# Patient Record
Sex: Female | Born: 1955
Health system: Southern US, Community
[De-identification: ages and names within clinical notes are randomized; demographics above are authoritative.]

## PROBLEM LIST (undated history)

## (undated) DIAGNOSIS — E119 Type 2 diabetes mellitus without complications: Secondary | ICD-10-CM

## (undated) DIAGNOSIS — R0789 Other chest pain: Secondary | ICD-10-CM

## (undated) DIAGNOSIS — I5032 Chronic diastolic (congestive) heart failure: Secondary | ICD-10-CM

## (undated) DIAGNOSIS — K219 Gastro-esophageal reflux disease without esophagitis: Secondary | ICD-10-CM

## (undated) DIAGNOSIS — I10 Essential (primary) hypertension: Secondary | ICD-10-CM

## (undated) DIAGNOSIS — M1990 Unspecified osteoarthritis, unspecified site: Secondary | ICD-10-CM

## (undated) DIAGNOSIS — E669 Obesity, unspecified: Secondary | ICD-10-CM

## (undated) DIAGNOSIS — L0390 Cellulitis, unspecified: Secondary | ICD-10-CM

## (undated) DIAGNOSIS — I4510 Unspecified right bundle-branch block: Secondary | ICD-10-CM

## (undated) DIAGNOSIS — J189 Pneumonia, unspecified organism: Secondary | ICD-10-CM

## (undated) DIAGNOSIS — E7800 Pure hypercholesterolemia, unspecified: Secondary | ICD-10-CM

## (undated) DIAGNOSIS — J45909 Unspecified asthma, uncomplicated: Secondary | ICD-10-CM

## (undated) HISTORY — PX: TENDON RELEASE: SHX230

## (undated) HISTORY — PX: TUBAL LIGATION: SHX77

## (undated) HISTORY — PX: BUNIONECTOMY: SHX129

## (undated) HISTORY — DX: Other chest pain: R07.89

## (undated) HISTORY — DX: Obesity, unspecified: E66.9

## (undated) HISTORY — DX: Unspecified asthma, uncomplicated: J45.909

## (undated) HISTORY — DX: Morbid (severe) obesity due to excess calories: E66.01

## (undated) HISTORY — DX: Unspecified osteoarthritis, unspecified site: M19.90

## (undated) HISTORY — PX: MULTIPLE TOOTH EXTRACTIONS: SHX2053

## (undated) HISTORY — DX: Chronic diastolic (congestive) heart failure: I50.32

## (undated) HISTORY — DX: Essential (primary) hypertension: I10

---

## 1997-08-04 ENCOUNTER — Ambulatory Visit (HOSPITAL_COMMUNITY): Admission: RE | Admit: 1997-08-04 | Discharge: 1997-08-04 | Payer: Self-pay | Admitting: Family Medicine

## 1998-10-23 ENCOUNTER — Encounter: Payer: Self-pay | Admitting: Emergency Medicine

## 1998-10-23 ENCOUNTER — Emergency Department (HOSPITAL_COMMUNITY): Admission: EM | Admit: 1998-10-23 | Discharge: 1998-10-23 | Payer: Self-pay | Admitting: Emergency Medicine

## 1999-01-25 ENCOUNTER — Encounter: Admission: RE | Admit: 1999-01-25 | Discharge: 1999-01-25 | Payer: Self-pay | Admitting: Family Medicine

## 1999-02-22 ENCOUNTER — Encounter: Payer: Self-pay | Admitting: Family Medicine

## 1999-02-22 ENCOUNTER — Ambulatory Visit (HOSPITAL_COMMUNITY): Admission: RE | Admit: 1999-02-22 | Discharge: 1999-02-22 | Payer: Self-pay | Admitting: Family Medicine

## 1999-02-27 ENCOUNTER — Encounter: Payer: Self-pay | Admitting: Family Medicine

## 1999-02-27 ENCOUNTER — Ambulatory Visit (HOSPITAL_COMMUNITY): Admission: RE | Admit: 1999-02-27 | Discharge: 1999-02-27 | Payer: Self-pay | Admitting: Family Medicine

## 1999-07-15 ENCOUNTER — Other Ambulatory Visit: Admission: RE | Admit: 1999-07-15 | Discharge: 1999-07-15 | Payer: Self-pay | Admitting: Family Medicine

## 2000-03-09 ENCOUNTER — Ambulatory Visit (HOSPITAL_COMMUNITY): Admission: RE | Admit: 2000-03-09 | Discharge: 2000-03-09 | Payer: Self-pay | Admitting: Family Medicine

## 2000-03-09 ENCOUNTER — Encounter: Payer: Self-pay | Admitting: Family Medicine

## 2001-03-12 ENCOUNTER — Encounter: Payer: Self-pay | Admitting: Psychologist

## 2001-03-12 ENCOUNTER — Ambulatory Visit (HOSPITAL_COMMUNITY): Admission: RE | Admit: 2001-03-12 | Discharge: 2001-03-12 | Payer: Self-pay

## 2001-03-15 ENCOUNTER — Encounter: Admission: RE | Admit: 2001-03-15 | Discharge: 2001-06-13 | Payer: Self-pay | Admitting: Orthopaedic Surgery

## 2001-06-26 ENCOUNTER — Observation Stay (HOSPITAL_COMMUNITY): Admission: EM | Admit: 2001-06-26 | Discharge: 2001-06-26 | Payer: Self-pay | Admitting: Emergency Medicine

## 2001-07-22 ENCOUNTER — Other Ambulatory Visit: Admission: RE | Admit: 2001-07-22 | Discharge: 2001-07-22 | Payer: Self-pay | Admitting: Family Medicine

## 2001-09-15 ENCOUNTER — Ambulatory Visit (HOSPITAL_COMMUNITY): Admission: RE | Admit: 2001-09-15 | Discharge: 2001-09-15 | Payer: Self-pay | Admitting: Gastroenterology

## 2002-01-19 ENCOUNTER — Other Ambulatory Visit: Admission: RE | Admit: 2002-01-19 | Discharge: 2002-01-19 | Payer: Self-pay | Admitting: Family Medicine

## 2002-06-14 ENCOUNTER — Emergency Department (HOSPITAL_COMMUNITY): Admission: EM | Admit: 2002-06-14 | Discharge: 2002-06-14 | Payer: Self-pay

## 2002-07-26 ENCOUNTER — Other Ambulatory Visit: Admission: RE | Admit: 2002-07-26 | Discharge: 2002-07-26 | Payer: Self-pay | Admitting: Family Medicine

## 2003-12-04 ENCOUNTER — Emergency Department (HOSPITAL_COMMUNITY): Admission: EM | Admit: 2003-12-04 | Discharge: 2003-12-05 | Payer: Self-pay | Admitting: Emergency Medicine

## 2004-02-06 ENCOUNTER — Other Ambulatory Visit: Admission: RE | Admit: 2004-02-06 | Discharge: 2004-02-06 | Payer: Self-pay | Admitting: Family Medicine

## 2004-03-18 ENCOUNTER — Ambulatory Visit (HOSPITAL_COMMUNITY): Admission: RE | Admit: 2004-03-18 | Discharge: 2004-03-18 | Payer: Self-pay | Admitting: Family Medicine

## 2004-11-30 ENCOUNTER — Emergency Department (HOSPITAL_COMMUNITY): Admission: EM | Admit: 2004-11-30 | Discharge: 2004-11-30 | Payer: Self-pay | Admitting: Emergency Medicine

## 2004-12-11 ENCOUNTER — Encounter: Admission: RE | Admit: 2004-12-11 | Discharge: 2004-12-11 | Payer: Self-pay | Admitting: Family Medicine

## 2005-03-15 ENCOUNTER — Encounter: Admission: RE | Admit: 2005-03-15 | Discharge: 2005-03-15 | Payer: Self-pay | Admitting: Orthopedic Surgery

## 2006-09-24 ENCOUNTER — Encounter: Admission: RE | Admit: 2006-09-24 | Discharge: 2006-09-24 | Payer: Self-pay | Admitting: Orthopedic Surgery

## 2006-09-25 ENCOUNTER — Other Ambulatory Visit: Admission: RE | Admit: 2006-09-25 | Discharge: 2006-09-25 | Payer: Self-pay | Admitting: Family Medicine

## 2006-10-07 ENCOUNTER — Ambulatory Visit (HOSPITAL_COMMUNITY): Admission: RE | Admit: 2006-10-07 | Discharge: 2006-10-07 | Payer: Self-pay | Admitting: Family Medicine

## 2006-10-13 ENCOUNTER — Encounter: Admission: RE | Admit: 2006-10-13 | Discharge: 2006-10-13 | Payer: Self-pay | Admitting: Family Medicine

## 2006-11-11 ENCOUNTER — Emergency Department (HOSPITAL_COMMUNITY): Admission: EM | Admit: 2006-11-11 | Discharge: 2006-11-11 | Payer: Self-pay | Admitting: Family Medicine

## 2006-12-15 ENCOUNTER — Ambulatory Visit (HOSPITAL_BASED_OUTPATIENT_CLINIC_OR_DEPARTMENT_OTHER): Admission: RE | Admit: 2006-12-15 | Discharge: 2006-12-15 | Payer: Self-pay | Admitting: Surgery

## 2006-12-15 ENCOUNTER — Encounter (INDEPENDENT_AMBULATORY_CARE_PROVIDER_SITE_OTHER): Payer: Self-pay | Admitting: Surgery

## 2006-12-15 ENCOUNTER — Encounter: Admission: RE | Admit: 2006-12-15 | Discharge: 2006-12-15 | Payer: Self-pay | Admitting: Surgery

## 2007-09-23 ENCOUNTER — Other Ambulatory Visit: Admission: RE | Admit: 2007-09-23 | Discharge: 2007-09-23 | Payer: Self-pay | Admitting: Obstetrics and Gynecology

## 2007-10-18 ENCOUNTER — Ambulatory Visit (HOSPITAL_COMMUNITY): Admission: RE | Admit: 2007-10-18 | Discharge: 2007-10-18 | Payer: Self-pay | Admitting: Surgery

## 2008-12-07 ENCOUNTER — Ambulatory Visit (HOSPITAL_COMMUNITY): Admission: RE | Admit: 2008-12-07 | Discharge: 2008-12-07 | Payer: Self-pay | Admitting: Family Medicine

## 2008-12-16 ENCOUNTER — Emergency Department (HOSPITAL_COMMUNITY): Admission: EM | Admit: 2008-12-16 | Discharge: 2008-12-16 | Payer: Self-pay | Admitting: Emergency Medicine

## 2009-04-27 ENCOUNTER — Other Ambulatory Visit: Admission: RE | Admit: 2009-04-27 | Discharge: 2009-04-27 | Payer: Self-pay | Admitting: Family Medicine

## 2009-12-06 ENCOUNTER — Ambulatory Visit (HOSPITAL_COMMUNITY): Admission: RE | Admit: 2009-12-06 | Discharge: 2009-12-06 | Payer: Self-pay | Admitting: Gastroenterology

## 2009-12-10 ENCOUNTER — Ambulatory Visit (HOSPITAL_COMMUNITY): Admission: RE | Admit: 2009-12-10 | Discharge: 2009-12-10 | Payer: Self-pay | Admitting: Family Medicine

## 2010-05-05 ENCOUNTER — Encounter: Payer: Self-pay | Admitting: Family Medicine

## 2010-08-27 NOTE — Op Note (Signed)
Amanda Butler, FARRIER NO.:  [id number]   MEDICAL RECORD NO.:  [id number]          PATIENT TYPE:  AMB   LOCATION:  DSC                          FACILITY:  [facility]   PHYSICIAN:  Currie Paris, M.D.DATE OF BIRTH:  1955-06-26   DATE OF PROCEDURE:  12/15/2006  DATE OF DISCHARGE:                               OPERATIVE REPORT   PREOPERATIVE DIAGNOSIS:  Right breast calcifications, indeterminate.   POSTOPERATIVE DIAGNOSIS:  Right breast calcifications, indeterminate.   OPERATION:  Needle guided excision right breast calcifications.   SURGEON:  Cyndia Bent, M.D.   ANESTHESIA:  General.   CLINICAL HISTORY:  This is a 55 year old lady with some indeterminate  breast calcifications in the medial portion of her right breast.  They  were not amenable to a core biopsy.   DESCRIPTION OF PROCEDURE:  The patient was seen in the holding area and  she had no further questions.  We identified and marked the right breast  as the operative site.  She already had a guidewire placed.  I reviewed  those films and the calcifications appeared fairly close to the  guidewire entry site with the guidewire going well lateral to them and  towards the nipple-areolar complex.   The patient was taken to the operating room and after satisfactory  general (LMA) anesthesia was obtained, the right breast was prepped and  draped.  A time-out was done.   I infiltrated 0.25% Marcaine with epinephrine around the area of the  guidewire to help with postop pain relief.  The patient had noted that  she had a fair amount of bleeding with the guidewire placement.   I made a transverse incision starting at the guidewire which entered  medially and tracked laterally and opened the incision going directly  over the guidewire tract.  I divided some of the breast and fatty tissue  medial to the guidewire down deep so that I was well around the area of  calcifications were deep enough to be so.  I  raised the skin flaps  superiorly and inferiorly and then grasped the tissue with an Allis and  retracted up and out and used the cautery to take a nice big cylinder of  tissue around the guidewire going almost to the end of the guidewire and  to the edge of the areolar margin.  The area encompassed I thought had  the calcifications.   There was old bruising and blood which I cleaned out.  I made sure  everything was completely dry.  I then closed the breast in layers with  some 3-0 Vicryl, 4-0 Monocryl subcuticular plus Dermabond.   Radiology called that the specimen mammogram showed the calcifications  within it.   The patient tolerated the procedure well.  There were no operative  complications.  All counts were correct.      Currie Paris, M.D.  Electronically Signed     CJS/MEDQ  D:  12/15/2006  T:  12/15/2006  Job:  [id number]   cc:   Bryan Lemma. Manus Gunning, M.D.

## 2010-08-30 NOTE — Procedures (Signed)
[facility]  Patient:    PAMALEE, MARCOE Visit Number: [id number] MRN: [id number]          Service Type: END Location: ENDO Attending Physician:  Dennison Bulla Ii Dictated by:   Verlin Grills, M.D. Proc. Date: 09/15/01 Admit Date:  09/15/2001 Discharge Date: 09/15/2001   CC:         Blair Heys, M.D.   Procedure Report  PROCEDURE:  Colonoscopy.  REFERRING PHYSICIAN:  Blair Heys, M.D.  PROCEDURE INDICATION:  Ms. Jasmane Brockway is a 55 year old female, born 09-21-1955.  Ms. Henegar underwent colonoscopy approximately five years ago, and colon polyps were removed.  She is seen today for a screening colonoscopy with polypectomy to prevent colon cancer.  ENDOSCOPIST:  Verlin Grills, M.D.  PREMEDICATION:  Versed 7.5 mg, Demerol 50 mg.  ENDOSCOPE:  Olympus pediatric colonoscope.  DESCRIPTION OF PROCEDURE:  After obtaining informed consent, Ms. Ferrin was placed in the left lateral decubitus position.  I administered intravenous Demerol and intravenous Versed to achieve conscious sedation for the procedure.  The patients blood pressure, oxygen saturation, and cardiac rhythm were monitored throughout the procedure and documented in the medical record.  Anal inspection was normal.  Digital rectal exam was normal.  The Olympus pediatric video colonoscope was introduced into the rectum and easily advanced to the cecum.  The colonic preparation for the exam today was excellent.  RECTUM:  Normal.  SIGMOID COLON AND DESCENDING COLON:  Rare, small diverticula are present.  SPLENIC FLEXURE:  Normal.  TRANSVERSE COLON:  Normal.  HEPATIC FLEXURE:  Normal.  ASCENDING COLON:  Normal.  CECUM AND ILEOCECAL VALVE:  Normal.  ASSESSMENT:  Rare, small diverticula are noted in the left colon; otherwise normal proctocolonoscopy to the cecum.  No endoscopic evidence for the presence of colorectal neoplasia.  RECOMMENDATIONS:   Repeat colonoscopy in approximately five years. Dictated by:   Verlin Grills, M.D. Attending Physician:  Dennison Bulla Ii DD:  09/15/01 TD:  09/16/01 Job: [id number]

## 2010-08-30 NOTE — H&P (Signed)
[place]  Patient:    Amanda Butler, Amanda Butler Visit Number: [id number] MRN: [id number]          Service Type: MED Location: 2000 2028 01 Attending Physician:  Leanne Chang Dictated by:   Leanne Chang, M.D. Admit Date:  06/25/2001 Discharge Date: 06/26/2001                           History and Physical  CHIEF COMPLAINT:  Right-sided chest pain.  HISTORY OF PRESENT ILLNESS:  The patient is a 55 year old female with a history of asthma and arthritis.  Patient reports that she was seen at her PCPs office yesterday afternoon for an upper respiratory infection.  Around 4 oclock p.m., she started having right-sided chest pain which she described as a dull ache and rated it 5/10 on the pain scale.  The pain was also located in the right shoulder.  She initially felt that it was secondary to her arthritis but given that the pain was not relieved after four hours, she decided to be seen in the emergency department.  Patient denied any nausea, vomiting or diaphoresis in association with the pain.  She did report some mild shortness of breath that had started prior to pure chest pain.  She states that the shortness of breath has been going on for several weeks, especially with activity; she attributed that to her asthma.  Patients risk factors for heart disease include hypertension, obesity and family history.  PAST MEDICAL HISTORY: 1. Hypertension. 2. Asthma. 3. Gastroesophageal reflux disease. 4. Arthritis.  MEDICATIONS: 1. Triamterene/hydrochlorothiazide 37.5/25 mg one tablet q.d. 2. Prilosec. 3. Adalat 400 mg b.i.d. p.r.n. 4. Albuterol p.r.n. 5. Vanceril p.r.n.  ALLERGIES: 1. ASPIRIN. 2. MORPHINE. 3. VIOXX. 4. CELEBREX.  SOCIAL HISTORY:  Patient denied tobacco or alcohol use.  She denies any illicit drug use.  FAMILY HISTORY:  Father died of a myocardial infarction at the age of 47.  REVIEW OF SYSTEMS:  GENERAL:  Patient denies any  recent weight loss or weight gain.  HEENT:  Patient denies any recent changes in her vision or hearing. She does report some sore throat, nasal congestion and cough. CARDIOPULMONARY:  As per HPI.  GI:  Patient denies any diarrhea, constipation, blood in her stool or abdominal pain.  GU:  Patient denies any dysuria, urinary frequency or urgency.  MUSCULOSKELETAL:  Patient does report generalized arthritic pain, specifically in her shoulders, hips and knees. PSYCHIATRIC:  No documented history of psychiatric disorder.  PHYSICAL EXAMINATION:  VITALS:  Blood pressure 176/96, pulse of 84, respiratory rate of 18, temperature 97.5, O2 saturation 94% on room air.  GENERAL:  We have a pleasant, overweight female in no acute distress, answers questions appropriately.  Mood appears appropriate.  HEENT:  Normocephalic, atraumatic.  Pupils were equal and reactive to light. Fundi were benign.  Extraocular muscles were intact.  Conjunctivae were clear and pink.  Tympanic membrane were both clear bilaterally with normal auditory canals.  Oropharynx was erythematous with 1+ tonsillar swelling.  Upper dentures were in place.  NECK:  Supple.  No lymphadenopathy, carotid bruits or JVD.  No thyromegaly was noted.  LUNGS:  Clear to auscultation.  No rhonchi, wheezing or crackles.  Good air movement.  HEART:  Regular rate and rhythm.  Normal S1 and S2.  No murmurs, gallops or rubs.  Palpation of the chest wall was significant for reproducing patients pain specifically on the right side.  ABDOMEN:  Obese, nontender, nondistended.  Positive bowel sounds.  No hepatosplenomegaly.  No rebound or guarding.  No abdominal bruits.  EXTREMITIES:  No cyanosis, clubbing or edema.  Pulses 1+ throughout.  NEUROLOGIC:  Cranial nerves II-XII grossly intact.  No focal sensory or motor deficits were noted.  LABORATORY VALUES:  Sodium was 137, potassium was 3.9, chloride was 101, bicarb was 27, BUN and creatinine of  10 and 0.8, respectively, glucose of 114. CBC showed a white count of 9.3, hemoglobin and hematocrit of 15.2 and 43.8, respectively, and platelets of 264,000.  CK was 109, troponin was 0.01.  EKG showed normal sinus rhythm with no ST elevations or depressions.  No Q waves were noted.  There was a right bundle branch block.  IMPRESSION:  [age]-year-old female who was recently seen at her doctors office for an upper respiratory infection, presenting with atypical right-sided chest pain which is mildly reproducible by palpation of the chest wall.  Most likely etiology includes musculoskeletal but given patients risk factors and some suggestive symptoms, will admit for rule out myocardial infarction.  PLAN: 1. Will admit patient for rule out MI. 2. Will restart patient on her preadmission medications. 3. Further recommendation pending clinical course and laboratory studies. 4. Disposition:  If patient rules out for a myocardial infarction, I believe    it would be appropriate to discharge patient to home with outpatient    followup with cardiac workup as needed. Dictated by:   Leanne Chang, M.D. Attending Physician:  Leanne Chang DD:  06/26/01 TD:  06/28/01 Job: [id number]

## 2011-01-14 ENCOUNTER — Other Ambulatory Visit (HOSPITAL_COMMUNITY): Payer: Self-pay | Admitting: Family Medicine

## 2011-01-14 DIAGNOSIS — Z1231 Encounter for screening mammogram for malignant neoplasm of breast: Secondary | ICD-10-CM

## 2011-01-24 ENCOUNTER — Ambulatory Visit (HOSPITAL_COMMUNITY)
Admission: RE | Admit: 2011-01-24 | Discharge: 2011-01-24 | Disposition: A | Discharge status: Home / Self Care | Payer: 59 | Source: Ambulatory Visit | Attending: Family Medicine | Admitting: Family Medicine

## 2011-01-24 DIAGNOSIS — Z1231 Encounter for screening mammogram for malignant neoplasm of breast: Secondary | ICD-10-CM

## 2011-01-24 LAB — BASIC METABOLIC PANEL
BUN: 14
CO2: 27
Calcium: 9.4
Chloride: 107
Creatinine, Ser: 0.75
GFR calc Af Amer: >60
GFR calc non Af Amer: >60
Glucose, Bld: 114 — ABNORMAL HIGH
Potassium: 3.9
Sodium: 141

## 2011-01-24 LAB — POCT HEMOGLOBIN-HEMACUE
Hemoglobin: 15.7 — ABNORMAL HIGH
Operator id: 123881

## 2011-10-23 ENCOUNTER — Encounter: Payer: 59 | Attending: Family Medicine | Admitting: Dietician

## 2011-10-23 ENCOUNTER — Encounter: Payer: Self-pay | Admitting: Dietician

## 2011-10-23 DIAGNOSIS — Z713 Dietary counseling and surveillance: Secondary | ICD-10-CM | POA: Insufficient documentation

## 2011-10-23 DIAGNOSIS — E119 Type 2 diabetes mellitus without complications: Secondary | ICD-10-CM | POA: Insufficient documentation

## 2011-10-23 NOTE — Progress Notes (Signed)
Medical Nutrition Therapy:  Appt start time: 0930 end time:  1130.  Assessment:  Primary concerns today: New onset diabetes.  Comes today to learn how to take care of herself and to not go on medications until I absolutely have to.   MEDICATIONS: Completed review of medications.  BLOOD GLUCOSE MONITORING:  Currently not monitoring.  Today,  about 1.5 hrs since last meal, random informal glucose check was 159 mg/ml  HYPOGLYCEMIA:  Gives no history of experiencing S/S.  HYPERGLYCEMIA:  Gives no history of experiencing A/A.   DIETARY INTAKE:  Usual eating pattern includes 3 meals and 0-1 snacks per day.  Everyday foods include meat, fruit, vegetables, starches.  Avoided foods include sweetened beverages and dessert items.    24-hr recall:  B ( AM): 8:00 cereal  At 1 cup (cheerioes, or honey nut cheerioes) and milk 1 cup.and sometimes 4 oz of diet soda or water.  OR scrambled egg and sausage patti, slice toast 2  (whole wheat) and banana or apple. Snk ( AM): none  L ( PM): Banana sandwich using 1 slice ww bread, mayo and 12 baked potato chips. Snk ( PM): none D ( PM): Sirloin steak, 5 oz, grilled, sweet potato (1/2) with just a little brown sugar and a glace of iced tea. Snk ( PM): none Beverages: water, diet soda, unsweetened tea.  Usual physical activity: Currently not active.  Plans to get back to walking on a regular basis.  Estimated energy needs:HT: 65 in  WT: 264.9 lb  BMI: 43.5 kg/m2  Adj WT: 170 lb (77 kg) 1400-1500 calories 160-165 g carbohydrates 105-110 g protein 38-40 g fat  Progress Towards Goal(s):  In progress.   Nutritional Diagnosis:  Duck-2.1 Inpaired nutrition utilization As related to glucose.  As evidenced by diagnosis of type 2 diabetes, increased fasting blood glucose levels , elevated 2 hr blood glucose with OGGT.Marland Kitchen    Intervention:  Nutrition Completed a review of the carbohydrate restricted diet for blood glucose control and the self-care measures for  prevention of short and long term complication of diabetes.   Call the MD's office for a prescription for strips and lancets for the One Touch Ultra Mini with the [facility] lancets.  Ask how often they will want you to check your blood glucose levels.  Goals:  Fasting (in the morning before food or drink): 80-120 mg   2 hours after the first bite of a meal.  Example:  Start eating lunch at 1:00 PM, then you will check your blood sugar at 3:00 PM goal is 80-160 mg.  Try to use a lotion on your feet that has lanolin for the feet.  Don't put it between your toes.  Skim or the 2% or less milk    Try to walk as often as possible, 2 times per day around the outside of your house in the cooler part of the day.  Try to aim for a 13-26 lb wight loss over the next 6-8 months.  Aim to keep carbs at 30-45 gm at meal times and 15 gm + protein for a snack.  Soda:  Try a crystal light peach tea or maybe a diet koolaid.  Use these every once in while to limit the diet soda.  OR a flavored water.    Handoutsndouts given during visit include:  Living Well With Diabetes  Blood Glucose Control Handout  Snack llist  Mock menu for 30-45 gm of ChO for meals  Monitoring/Evaluation:  Dietary intake, exercise,  blood glucose levels, and body weight in 8-12 weeks.

## 2011-10-23 NOTE — Patient Instructions (Addendum)
   Call the MD's office for a prescription for strips and lancets for the One Touch Ultra Mini with the [facility] lancets.  Ask how often they will want you to check your blood glucose levels.  Goals:  Fasting (in the morning before food or drink): 80-120 mg   2 hours after the first bite of a meal.  Example:  Start eating lunch at 1:00 PM, then you will check your blood sugar at 3:00 PM goal is 80-160 mg.  Try to use a lotion on your feet that has lanolin for the feet.  Don't put it between your toes.  Skim or the 2% or less milk    Try to walk as often as possible, 2 times per day around the outside of your house in the cooler part of the day.  Try to aim for a 13-26 lb wight loss over the next 6-8 months.  Aim to keep carbs at 30-45 gm at meal times and 15 gm + protein for a snack.  Soda:  Try a crystal light peach tea or maybe a diet koolaid.  Use these every once in while to limit the diet soda.  OR a flavored water.

## 2011-10-26 ENCOUNTER — Encounter: Payer: Self-pay | Admitting: Dietician

## 2011-12-23 ENCOUNTER — Other Ambulatory Visit (HOSPITAL_COMMUNITY): Payer: Self-pay | Admitting: Family Medicine

## 2011-12-23 DIAGNOSIS — Z1231 Encounter for screening mammogram for malignant neoplasm of breast: Secondary | ICD-10-CM

## 2012-01-26 ENCOUNTER — Ambulatory Visit (HOSPITAL_COMMUNITY)
Admission: RE | Admit: 2012-01-26 | Discharge: 2012-01-26 | Disposition: A | Discharge status: Home / Self Care | Payer: 59 | Source: Ambulatory Visit | Attending: Family Medicine | Admitting: Family Medicine

## 2012-01-26 DIAGNOSIS — Z1231 Encounter for screening mammogram for malignant neoplasm of breast: Secondary | ICD-10-CM

## 2013-01-13 ENCOUNTER — Other Ambulatory Visit (HOSPITAL_COMMUNITY): Payer: Self-pay | Admitting: Family Medicine

## 2013-01-13 DIAGNOSIS — Z1231 Encounter for screening mammogram for malignant neoplasm of breast: Secondary | ICD-10-CM

## 2013-01-26 ENCOUNTER — Ambulatory Visit (HOSPITAL_COMMUNITY)
Admission: RE | Admit: 2013-01-26 | Discharge: 2013-01-26 | Disposition: A | Discharge status: Home / Self Care | Payer: 59 | Source: Ambulatory Visit | Attending: Family Medicine | Admitting: Family Medicine

## 2013-01-26 DIAGNOSIS — Z1231 Encounter for screening mammogram for malignant neoplasm of breast: Secondary | ICD-10-CM

## 2014-01-03 ENCOUNTER — Other Ambulatory Visit (HOSPITAL_COMMUNITY): Payer: Self-pay | Admitting: Family Medicine

## 2014-01-03 DIAGNOSIS — Z1231 Encounter for screening mammogram for malignant neoplasm of breast: Secondary | ICD-10-CM

## 2014-01-27 ENCOUNTER — Ambulatory Visit (HOSPITAL_COMMUNITY)
Admission: RE | Admit: 2014-01-27 | Discharge: 2014-01-27 | Disposition: A | Discharge status: Home / Self Care | Payer: 59 | Source: Ambulatory Visit | Attending: Family Medicine | Admitting: Family Medicine

## 2014-01-27 DIAGNOSIS — Z1231 Encounter for screening mammogram for malignant neoplasm of breast: Secondary | ICD-10-CM

## 2015-01-12 ENCOUNTER — Other Ambulatory Visit: Payer: Self-pay

## 2015-01-12 DIAGNOSIS — Z1231 Encounter for screening mammogram for malignant neoplasm of breast: Secondary | ICD-10-CM

## 2015-01-12 DIAGNOSIS — Z803 Family history of malignant neoplasm of breast: Secondary | ICD-10-CM

## 2015-02-01 ENCOUNTER — Ambulatory Visit: Payer: Self-pay

## 2015-02-26 ENCOUNTER — Ambulatory Visit
Admission: RE | Admit: 2015-02-26 | Discharge: 2015-02-26 | Disposition: A | Discharge status: Home / Self Care | Payer: 59 | Source: Ambulatory Visit

## 2015-02-26 DIAGNOSIS — Z803 Family history of malignant neoplasm of breast: Secondary | ICD-10-CM

## 2015-02-26 DIAGNOSIS — Z1231 Encounter for screening mammogram for malignant neoplasm of breast: Secondary | ICD-10-CM

## 2015-05-18 ENCOUNTER — Encounter (HOSPITAL_COMMUNITY): Payer: Self-pay | Admitting: Emergency Medicine

## 2015-05-18 ENCOUNTER — Emergency Department (HOSPITAL_COMMUNITY): Payer: 59

## 2015-05-18 ENCOUNTER — Emergency Department (HOSPITAL_COMMUNITY)
Admission: EM | Admit: 2015-05-18 | Discharge: 2015-05-18 | Disposition: A | Discharge status: Home / Self Care | Payer: 59 | Attending: Emergency Medicine | Admitting: Emergency Medicine

## 2015-05-18 DIAGNOSIS — S8991XA Unspecified injury of right lower leg, initial encounter: Secondary | ICD-10-CM | POA: Diagnosis not present

## 2015-05-18 DIAGNOSIS — Z91040 Latex allergy status: Secondary | ICD-10-CM | POA: Insufficient documentation

## 2015-05-18 DIAGNOSIS — E119 Type 2 diabetes mellitus without complications: Secondary | ICD-10-CM | POA: Insufficient documentation

## 2015-05-18 DIAGNOSIS — M1711 Unilateral primary osteoarthritis, right knee: Secondary | ICD-10-CM | POA: Diagnosis not present

## 2015-05-18 DIAGNOSIS — Y9389 Activity, other specified: Secondary | ICD-10-CM | POA: Insufficient documentation

## 2015-05-18 DIAGNOSIS — I10 Essential (primary) hypertension: Secondary | ICD-10-CM | POA: Diagnosis not present

## 2015-05-18 DIAGNOSIS — Y92009 Unspecified place in unspecified non-institutional (private) residence as the place of occurrence of the external cause: Secondary | ICD-10-CM | POA: Diagnosis not present

## 2015-05-18 DIAGNOSIS — Z79899 Other long term (current) drug therapy: Secondary | ICD-10-CM | POA: Insufficient documentation

## 2015-05-18 DIAGNOSIS — W1839XA Other fall on same level, initial encounter: Secondary | ICD-10-CM | POA: Insufficient documentation

## 2015-05-18 DIAGNOSIS — Y998 Other external cause status: Secondary | ICD-10-CM | POA: Diagnosis not present

## 2015-05-18 DIAGNOSIS — M25561 Pain in right knee: Secondary | ICD-10-CM

## 2015-05-18 DIAGNOSIS — Z7951 Long term (current) use of inhaled steroids: Secondary | ICD-10-CM | POA: Insufficient documentation

## 2015-05-18 DIAGNOSIS — W19XXXA Unspecified fall, initial encounter: Secondary | ICD-10-CM

## 2015-05-18 MED ORDER — HYDROCODONE-ACETAMINOPHEN 5-325 MG PO TABS: 1.0000 | ORAL_TABLET | ORAL | Status: DC | PRN

## 2015-05-18 NOTE — ED Provider Notes (Signed)
CSN: [id number]     Arrival date & time 05/18/15  0127 History   First MD Initiated Contact with Patient 05/18/15 0157     Chief Complaint  Patient presents with  . Fall  . Knee Injury     (Consider location/radiation/quality/duration/timing/severity/associated sxs/prior Treatment) HPI Comments: This is a morbidly obese female who states yesterday morning about 10 AM when she got out of bed she fell forward landing on her right knee since that time.  It's been painful.  She has not taken anything for pain as she "did not have anything".  Since that time she has been and the tori, but states that it's been very painful.  Denies any other injuries  Patient is a 60 y.o. female presenting with fall. The history is provided by the patient.  Fall This is a new problem. The current episode started yesterday. The problem occurs constantly. The problem has been unchanged. Associated symptoms include arthralgias. Pertinent negatives include no chills, coughing, fever, joint swelling, numbness or weakness. The symptoms are aggravated by walking. She has tried nothing for the symptoms. The treatment provided no relief.    Past Medical History  Diagnosis Date  . Diabetes mellitus   . Arthritis   . Asthma   . Hypertension   . Obesity    Past Surgical History  Procedure Laterality Date  . Tubal ligation    . Tendon release      RT Leg  . Bunionectomy    . Multiple tooth extractions     Family History  Problem Relation Age of Onset  . Diabetes Mother   . Hypertension Mother   . Diabetes Father   . Hypertension Father   . Heart attack Father   . Diabetes Maternal Aunt   . Diabetes Maternal Grandmother   . Hypertension Maternal Grandmother   . Cancer Maternal Grandfather   . Diabetes Maternal Grandfather   . Hypertension Maternal Grandfather   . Asthma Paternal Grandmother   . Diabetes Paternal Grandmother   . Hypertension Paternal Grandmother   . Diabetes Paternal Grandfather   .  Hypertension Paternal Grandfather   . Heart failure Paternal Grandfather    Social History  Substance Use Topics  . Smoking status: Never Smoker   . Smokeless tobacco: Never Used  . Alcohol Use: No   OB History    No data available     Review of Systems  Constitutional: Negative for fever and chills.  Respiratory: Negative for cough.   Musculoskeletal: Positive for arthralgias. Negative for joint swelling.  Skin: Negative for wound.  Neurological: Negative for weakness and numbness.  All other systems reviewed and are negative.     Allergies  Aspirin; Bee venom; Iodine; Latex; Morphine and related; Nsaids; and Shellfish allergy  Home Medications   Prior to Admission medications   Medication Sig Start Date End Date Taking? Authorizing Provider  albuterol (PROVENTIL HFA;VENTOLIN HFA) 108 (90 BASE) MCG/ACT inhaler Inhale 2 puffs into the lungs 2 (two) times daily as needed.    Historical Provider, MD  budesonide-formoterol (SYMBICORT) 160-4.5 MCG/ACT inhaler Inhale 2 puffs into the lungs 2 (two) times daily as needed.    Historical Provider, MD  famotidine (PEPCID) 10 MG tablet Take 10 mg by mouth daily as needed.    Historical Provider, MD  HYDROcodone-acetaminophen (NORCO/VICODIN) 5-325 MG tablet Take 1 tablet by mouth every 4 (four) hours as needed for moderate pain. 05/18/15   Jola Schmidt, MD  metoprolol (LOPRESSOR) 50 MG tablet Take 50  mg by mouth daily.    Historical Provider, MD  mometasone (NASONEX) 50 MCG/ACT nasal spray Place 2 sprays into the nose daily. Daily as needed    Historical Provider, MD  triamterene-hydrochlorothiazide (MAXZIDE) 75-50 MG per tablet Take 1 tablet by mouth daily.    Historical Provider, MD   BP 155/75 mmHg  Pulse 80  Temp(Src) 98.1 F (36.7 C) (Oral)  Resp 20  SpO2 99% Physical Exam  Constitutional: She appears well-developed and well-nourished.  HENT:  Head: Normocephalic.  Eyes: Pupils are equal, round, and reactive to light.  Neck:  Normal range of motion.  Cardiovascular: Normal rate.   Pulmonary/Chest: Effort normal.  Musculoskeletal: She exhibits tenderness. She exhibits no edema.       Right knee: She exhibits erythema. She exhibits normal range of motion, no swelling, no effusion, no ecchymosis, no deformity and no laceration. Tenderness found.       Legs: Neurological: She is alert.  Skin: Skin is warm and dry.  Nursing note and vitals reviewed.   ED Course  Procedures (including critical care time) Labs Review Labs Reviewed - No data to display  Imaging Review Dg Knee Complete 4 Views Right  05/18/2015  CLINICAL DATA:  Fall at home 05/17/2015 with twisted right knee and pain. Initial encounter. EXAM: RIGHT KNEE - COMPLETE 4+ VIEW COMPARISON:  11/11/2006 FINDINGS: Small to moderate knee joint effusion. No evidence of fracture or subluxation. Tricompartmental osteoarthritis with significant progression since prior. Now there is bulky spurring and medial and patellofemoral compartment narrowing. IMPRESSION: 1. Joint effusion without acute osseous finding. 2. Advanced osteoarthritis with notable progression since 2008. Electronically Signed   By: Monte Fantasia M.D.   On: 05/18/2015 02:37   I have personally reviewed and evaluated these images and lab results as part of my medical decision-making.   EKG Interpretation None     Placed in knee sleeve has ortho that she will FU with  MDM   Final diagnoses:  Osteoarthritis of right knee, unspecified osteoarthritis type  Acute knee pain, right  Fall, initial encounter         Junius Creamer, NP 05/18/15 0304  Jola Schmidt, MD 05/18/15 [phone]

## 2015-05-18 NOTE — ED Notes (Signed)
Patient d/c'd self care.  F/U and medications discussed.  Patient verbalized understanding. 

## 2015-05-18 NOTE — ED Notes (Signed)
Pt states she fell at home about 2 hrs ago and is c/o right knee pain

## 2015-09-13 ENCOUNTER — Observation Stay (HOSPITAL_COMMUNITY)
Admission: EM | Admit: 2015-09-13 | Discharge: 2015-09-16 | Disposition: A | Discharge status: Home / Self Care | Payer: 59 | Attending: Internal Medicine | Admitting: Internal Medicine

## 2015-09-13 ENCOUNTER — Encounter (HOSPITAL_COMMUNITY): Payer: Self-pay

## 2015-09-13 ENCOUNTER — Emergency Department (HOSPITAL_COMMUNITY): Payer: 59

## 2015-09-13 DIAGNOSIS — L03116 Cellulitis of left lower limb: Secondary | ICD-10-CM | POA: Diagnosis present

## 2015-09-13 DIAGNOSIS — K219 Gastro-esophageal reflux disease without esophagitis: Secondary | ICD-10-CM | POA: Diagnosis not present

## 2015-09-13 DIAGNOSIS — J4520 Mild intermittent asthma, uncomplicated: Secondary | ICD-10-CM

## 2015-09-13 DIAGNOSIS — R17 Unspecified jaundice: Secondary | ICD-10-CM | POA: Diagnosis present

## 2015-09-13 DIAGNOSIS — I872 Venous insufficiency (chronic) (peripheral): Secondary | ICD-10-CM | POA: Diagnosis present

## 2015-09-13 DIAGNOSIS — N189 Chronic kidney disease, unspecified: Secondary | ICD-10-CM | POA: Insufficient documentation

## 2015-09-13 DIAGNOSIS — J45901 Unspecified asthma with (acute) exacerbation: Secondary | ICD-10-CM | POA: Diagnosis not present

## 2015-09-13 DIAGNOSIS — Z8701 Personal history of pneumonia (recurrent): Secondary | ICD-10-CM | POA: Insufficient documentation

## 2015-09-13 DIAGNOSIS — Z79899 Other long term (current) drug therapy: Secondary | ICD-10-CM | POA: Diagnosis not present

## 2015-09-13 DIAGNOSIS — E669 Obesity, unspecified: Secondary | ICD-10-CM | POA: Diagnosis not present

## 2015-09-13 DIAGNOSIS — R61 Generalized hyperhidrosis: Secondary | ICD-10-CM | POA: Insufficient documentation

## 2015-09-13 DIAGNOSIS — Z91040 Latex allergy status: Secondary | ICD-10-CM | POA: Insufficient documentation

## 2015-09-13 DIAGNOSIS — R079 Chest pain, unspecified: Secondary | ICD-10-CM | POA: Diagnosis not present

## 2015-09-13 DIAGNOSIS — R600 Localized edema: Secondary | ICD-10-CM | POA: Diagnosis present

## 2015-09-13 DIAGNOSIS — I129 Hypertensive chronic kidney disease with stage 1 through stage 4 chronic kidney disease, or unspecified chronic kidney disease: Secondary | ICD-10-CM | POA: Insufficient documentation

## 2015-09-13 DIAGNOSIS — M1990 Unspecified osteoarthritis, unspecified site: Secondary | ICD-10-CM | POA: Diagnosis not present

## 2015-09-13 DIAGNOSIS — E1122 Type 2 diabetes mellitus with diabetic chronic kidney disease: Secondary | ICD-10-CM | POA: Diagnosis not present

## 2015-09-13 DIAGNOSIS — J45909 Unspecified asthma, uncomplicated: Secondary | ICD-10-CM | POA: Diagnosis present

## 2015-09-13 DIAGNOSIS — I10 Essential (primary) hypertension: Secondary | ICD-10-CM | POA: Insufficient documentation

## 2015-09-13 DIAGNOSIS — I4510 Unspecified right bundle-branch block: Secondary | ICD-10-CM | POA: Diagnosis present

## 2015-09-13 DIAGNOSIS — E119 Type 2 diabetes mellitus without complications: Secondary | ICD-10-CM

## 2015-09-13 HISTORY — DX: Pneumonia, unspecified organism: J18.9

## 2015-09-13 HISTORY — DX: Unspecified right bundle-branch block: I45.10

## 2015-09-13 HISTORY — DX: Gastro-esophageal reflux disease without esophagitis: K21.9

## 2015-09-13 HISTORY — DX: Pure hypercholesterolemia, unspecified: E78.00

## 2015-09-13 HISTORY — DX: Type 2 diabetes mellitus without complications: E11.9

## 2015-09-13 LAB — URINALYSIS, ROUTINE W REFLEX MICROSCOPIC
Bilirubin Urine: NEGATIVE
Glucose, UA: NEGATIVE mg/dL
Hgb urine dipstick: NEGATIVE
Ketones, ur: NEGATIVE mg/dL
Nitrite: NEGATIVE
Protein, ur: NEGATIVE mg/dL
Specific Gravity, Urine: 1.013 (ref 1.005–1.030)
pH: 5.5 (ref 5.0–8.0)

## 2015-09-13 LAB — URINE MICROSCOPIC-ADD ON: RBC / HPF: NONE SEEN RBC/hpf (ref 0–5)

## 2015-09-13 LAB — COMPREHENSIVE METABOLIC PANEL
ALT: 19 U/L (ref 14–54)
AST: 19 U/L (ref 15–41)
Albumin: 3.7 g/dL (ref 3.5–5.0)
Alkaline Phosphatase: 76 U/L (ref 38–126)
Anion gap: 10 (ref 5–15)
BUN: 8 mg/dL (ref 6–20)
CO2: 26 mmol/L (ref 22–32)
Calcium: 9.3 mg/dL (ref 8.9–10.3)
Chloride: 104 mmol/L (ref 101–111)
Creatinine, Ser: 0.89 mg/dL (ref 0.44–1.00)
GFR calc Af Amer: >60 mL/min (ref >60)
GFR calc non Af Amer: >60 mL/min (ref >60)
Glucose, Bld: 152 mg/dL — ABNORMAL HIGH (ref 65–99)
Potassium: 3.2 mmol/L — ABNORMAL LOW (ref 3.5–5.1)
Sodium: 140 mmol/L (ref 135–145)
Total Bilirubin: 1.7 mg/dL — ABNORMAL HIGH (ref 0.3–1.2)
Total Protein: 7.1 g/dL (ref 6.5–8.1)

## 2015-09-13 LAB — CBC WITH DIFFERENTIAL/PLATELET
Basophils Absolute: 0 10*3/uL (ref 0.0–0.1)
Basophils Relative: 1 %
Eosinophils Absolute: 0.1 10*3/uL (ref 0.0–0.7)
Eosinophils Relative: 2 %
HCT: 42.9 % (ref 36.0–46.0)
Hemoglobin: 14.2 g/dL (ref 12.0–15.0)
Lymphocytes Relative: 23 %
Lymphs Abs: 1.4 10*3/uL (ref 0.7–4.0)
MCH: 29.1 pg (ref 26.0–34.0)
MCHC: 33.1 g/dL (ref 30.0–36.0)
MCV: 87.9 fL (ref 78.0–100.0)
Monocytes Absolute: 0.4 10*3/uL (ref 0.1–1.0)
Monocytes Relative: 7 %
Neutro Abs: 4.4 10*3/uL (ref 1.7–7.7)
Neutrophils Relative %: 69 %
Platelets: 177 10*3/uL (ref 150–400)
RBC: 4.88 MIL/uL (ref 3.87–5.11)
RDW: 13.8 % (ref 11.5–15.5)
WBC: 6.4 10*3/uL (ref 4.0–10.5)

## 2015-09-13 LAB — MRSA PCR SCREENING: MRSA by PCR: NEGATIVE

## 2015-09-13 LAB — I-STAT TROPONIN, ED: Troponin i, poc: 0 ng/mL (ref 0.00–0.08)

## 2015-09-13 LAB — D-DIMER, QUANTITATIVE: D-Dimer, Quant: 0.52 ug/mL-FEU — ABNORMAL HIGH (ref 0.00–0.50)

## 2015-09-13 LAB — GLUCOSE, CAPILLARY
Glucose-Capillary: 142 mg/dL — ABNORMAL HIGH (ref 65–99)
Glucose-Capillary: 152 mg/dL — ABNORMAL HIGH (ref 65–99)

## 2015-09-13 LAB — TROPONIN I
Troponin I: <0.03 ng/mL (ref <0.031)
Troponin I: <0.03 ng/mL (ref <0.031)

## 2015-09-13 LAB — CBG MONITORING, ED: Glucose-Capillary: 178 mg/dL — ABNORMAL HIGH (ref 65–99)

## 2015-09-13 MED ORDER — TRIAMTERENE-HCTZ 75-50 MG PO TABS
1.0000 | ORAL_TABLET | Freq: Every day | ORAL | Status: DC
  Filled 2015-09-13: qty 1

## 2015-09-13 MED ORDER — FAMOTIDINE 20 MG PO TABS: 10.0000 mg | ORAL_TABLET | Freq: Every day | ORAL | Status: DC | PRN

## 2015-09-13 MED ORDER — MOMETASONE FURO-FORMOTEROL FUM 200-5 MCG/ACT IN AERO
2.0000 | INHALATION_SPRAY | Freq: Two times a day (BID) | RESPIRATORY_TRACT | Status: DC
  Administered 2015-09-13 – 2015-09-16 (×4): 2 via RESPIRATORY_TRACT
  Filled 2015-09-13: qty 8.8

## 2015-09-13 MED ORDER — INSULIN ASPART 100 UNIT/ML ~~LOC~~ SOLN
0.0000 [IU] | Freq: Three times a day (TID) | SUBCUTANEOUS | Status: DC
  Administered 2015-09-13: 2 [IU] via SUBCUTANEOUS
  Administered 2015-09-13: 1 [IU] via SUBCUTANEOUS
  Administered 2015-09-14: 2 [IU] via SUBCUTANEOUS
  Administered 2015-09-14: 1 [IU] via SUBCUTANEOUS
  Administered 2015-09-15 – 2015-09-16 (×3): 2 [IU] via SUBCUTANEOUS
  Filled 2015-09-13: qty 1

## 2015-09-13 MED ORDER — INSULIN ASPART 100 UNIT/ML ~~LOC~~ SOLN: 0.0000 [IU] | Freq: Every day | SUBCUTANEOUS | Status: DC

## 2015-09-13 MED ORDER — ALBUTEROL SULFATE (2.5 MG/3ML) 0.083% IN NEBU: 3.0000 mL | INHALATION_SOLUTION | RESPIRATORY_TRACT | Status: DC | PRN

## 2015-09-13 MED ORDER — ENOXAPARIN SODIUM 40 MG/0.4ML ~~LOC~~ SOLN
40.0000 mg | SUBCUTANEOUS | Status: DC
  Administered 2015-09-13 – 2015-09-14 (×2): 40 mg via SUBCUTANEOUS
  Filled 2015-09-13 (×2): qty 0.4

## 2015-09-13 MED ORDER — GI COCKTAIL ~~LOC~~: 30.0000 mL | Freq: Four times a day (QID) | ORAL | Status: DC | PRN

## 2015-09-13 MED ORDER — METOPROLOL SUCCINATE ER 50 MG PO TB24
50.0000 mg | ORAL_TABLET | Freq: Every day | ORAL | Status: DC
  Administered 2015-09-13 – 2015-09-16 (×4): 50 mg via ORAL
  Filled 2015-09-13 (×4): qty 1

## 2015-09-13 MED ORDER — HYDROCODONE-ACETAMINOPHEN 5-325 MG PO TABS: 1.0000 | ORAL_TABLET | ORAL | Status: DC | PRN

## 2015-09-13 MED ORDER — MORPHINE SULFATE (PF) 2 MG/ML IV SOLN: 2.0000 mg | INTRAVENOUS | Status: DC | PRN

## 2015-09-13 MED ORDER — NITROGLYCERIN 2 % TD OINT
0.5000 [in_us] | TOPICAL_OINTMENT | Freq: Four times a day (QID) | TRANSDERMAL | Status: DC
  Administered 2015-09-13 – 2015-09-16 (×12): 0.5 [in_us] via TOPICAL
  Filled 2015-09-13: qty 30
  Filled 2015-09-13: qty 1

## 2015-09-13 MED ORDER — POTASSIUM CHLORIDE CRYS ER 20 MEQ PO TBCR
40.0000 meq | EXTENDED_RELEASE_TABLET | Freq: Once | ORAL | Status: AC
  Administered 2015-09-13: 40 meq via ORAL
  Filled 2015-09-13: qty 2

## 2015-09-13 MED ORDER — FLUTICASONE PROPIONATE 50 MCG/ACT NA SUSP
1.0000 | Freq: Every day | NASAL | Status: DC
  Administered 2015-09-14: 1 via NASAL
  Filled 2015-09-13: qty 16

## 2015-09-13 MED ORDER — METOPROLOL TARTRATE 50 MG PO TABS: 50.0000 mg | ORAL_TABLET | Freq: Every day | ORAL | Status: DC

## 2015-09-13 MED ORDER — ACETAMINOPHEN 325 MG PO TABS: 650.0000 mg | ORAL_TABLET | ORAL | Status: DC | PRN

## 2015-09-13 MED ORDER — ONDANSETRON HCL 4 MG/2ML IJ SOLN: 4.0000 mg | Freq: Four times a day (QID) | INTRAMUSCULAR | Status: DC | PRN

## 2015-09-13 NOTE — ED Notes (Addendum)
Pt arrives EMS with c/o substernal chest pain and n/v radiating to right arm. Pt states she woke with this pain.

## 2015-09-13 NOTE — ED Provider Notes (Signed)
CSN: [id number]     Arrival date & time 09/13/15  [id number] History   First MD Initiated Contact with Patient 09/13/15 [phone]     Chief Complaint  Patient presents with  . Chest Pain     (Consider location/radiation/quality/duration/timing/severity/associated sxs/prior Treatment) HPI Comments: Amanda Butler is a 60 y.o. female with history of asthma, hypertension, acid reflux, chronic kidney disease, and diabetes presents to ED with complaint of chest pain. Pain initially started 8:30 this morning. It was central in location with radiation into the right shoulder and right back. Pain described as squeezing pain in tightness. Pain is not worse with deep inspiration or movement. EMS gave 3 nitroglycerin with improvement of pain from 10 to 3. Current pain level is 6/10.  Associated symptoms include diaphoresis, shortness of breath, productive cough, wheezing, generalized weakness, lightheadedness. No loss of consciousness. Patient denies recent long-distance travel/surgeries/immobilization, no history of blood clots, no history of cancer or cancer treatment, or hemoptysis. Per patient, history of right bundle branch block; otherwise, cardiac history unremarkable.  Patient is a 60 y.o. female presenting with chest pain. The history is provided by the patient and medical records.  Chest Pain Pain location:  Substernal area and R chest Associated symptoms: cough ( non-productive), diaphoresis, nausea, shortness of breath and weakness ( generalized)   Associated symptoms: no abdominal pain, no fatigue, no fever, no numbness and not vomiting     Past Medical History  Diagnosis Date  . Diabetes mellitus   . Arthritis   . Asthma   . Hypertension   . Obesity    Past Surgical History  Procedure Laterality Date  . Tubal ligation    . Tendon release      RT Leg  . Bunionectomy    . Multiple tooth extractions     Family History  Problem Relation Age of Onset  . Diabetes Mother   . Hypertension  Mother   . Diabetes Father   . Hypertension Father   . Heart attack Father   . Diabetes Maternal Aunt   . Diabetes Maternal Grandmother   . Hypertension Maternal Grandmother   . Cancer Maternal Grandfather   . Diabetes Maternal Grandfather   . Hypertension Maternal Grandfather   . Asthma Paternal Grandmother   . Diabetes Paternal Grandmother   . Hypertension Paternal Grandmother   . Diabetes Paternal Grandfather   . Hypertension Paternal Grandfather   . Heart failure Paternal Grandfather    Social History  Substance Use Topics  . Smoking status: Never Smoker   . Smokeless tobacco: Never Used  . Alcohol Use: No   OB History    No data available     Review of Systems  Constitutional: Positive for diaphoresis. Negative for fever, chills and fatigue.  HENT: Positive for sinus pressure.   Eyes: Positive for visual disturbance ( blurry vision, recent diagnosis of cataract. Needs to fill new prescription).  Respiratory: Positive for cough ( non-productive), shortness of breath and wheezing.   Cardiovascular: Positive for chest pain and leg swelling ( b/l).  Gastrointestinal: Positive for nausea. Negative for vomiting, abdominal pain, diarrhea and constipation.  Genitourinary: Negative for dysuria and hematuria.  Musculoskeletal: Negative for neck pain and neck stiffness.  Skin:       Chronic skin changes of lower extremities b/l secondary to diabetes.   Allergic/Immunologic: Positive for immunocompromised state.  Neurological: Positive for weakness ( generalized) and light-headedness. Negative for syncope and numbness.      Allergies  Aspirin; Bee venom;  Iodine; Latex; Morphine and related; Nsaids; and Shellfish allergy  Home Medications   Prior to Admission medications   Medication Sig Start Date End Date Taking? Authorizing Provider  albuterol (PROVENTIL HFA;VENTOLIN HFA) 108 (90 BASE) MCG/ACT inhaler Inhale 2 puffs into the lungs 2 (two) times daily as needed.     Historical Provider, MD  budesonide-formoterol (SYMBICORT) 160-4.5 MCG/ACT inhaler Inhale 2 puffs into the lungs 2 (two) times daily as needed.    Historical Provider, MD  famotidine (PEPCID) 10 MG tablet Take 10 mg by mouth daily as needed.    Historical Provider, MD  HYDROcodone-acetaminophen (NORCO/VICODIN) 5-325 MG tablet Take 1 tablet by mouth every 4 (four) hours as needed for moderate pain. 05/18/15   Jola Schmidt, MD  metoprolol (LOPRESSOR) 50 MG tablet Take 50 mg by mouth daily.    Historical Provider, MD  mometasone (NASONEX) 50 MCG/ACT nasal spray Place 2 sprays into the nose daily. Daily as needed    Historical Provider, MD  triamterene-hydrochlorothiazide (MAXZIDE) 75-50 MG per tablet Take 1 tablet by mouth daily.    Historical Provider, MD   BP 173/88 mmHg  Pulse 62  Temp(Src) 97.5 F (36.4 C) (Oral)  Resp 17  SpO2 94% Physical Exam  Constitutional: She appears well-developed and well-nourished. She appears ill. She appears distressed ( mild).  HENT:  Head: Normocephalic and atraumatic.  Mouth/Throat: Oropharynx is clear and moist. No oropharyngeal exudate.  Eyes: Conjunctivae and EOM are normal. Pupils are equal, round, and reactive to light. Right eye exhibits no discharge. Left eye exhibits no discharge. No scleral icterus.  Neck: Normal range of motion. Neck supple.  Cardiovascular: Normal rate, regular rhythm, normal heart sounds and intact distal pulses.   No murmur heard. B/l 1+ lower extremity edema. Per pt, chronic in nature.  Pulmonary/Chest: Effort normal and breath sounds normal. No accessory muscle usage or stridor. No respiratory distress. She has no wheezes. She exhibits tenderness.    Abdominal: Soft. Bowel sounds are normal. There is no tenderness. There is no rebound and no guarding.  Obese abdomen  Musculoskeletal: Normal range of motion.  Lymphadenopathy:    She has no cervical adenopathy.  Neurological: She is alert. Coordination normal.  Mental  Status:  Alert, thought content appropriate, able to give a coherent history. Speech fluent without evidence of aphasia. Able to follow 2 step commands without difficulty.  Cranial Nerves:  II:  Peripheral visual fields grossly normal, pupils equal, round, reactive to light III,IV, VI: ptosis not present, extra-ocular motions intact bilaterally  V,VII: smile symmetric, facial light touch sensation equal VIII: hearing grossly normal to voice  X: uvula elevates symmetrically  XI: bilateral shoulder shrug symmetric and strong XII: midline tongue extension without fassiculations Motor:  Normal tone. 5/5 in upper and lower extremities bilaterally including strong and equal grip strength and dorsiflexion/plantar flexion Sensory: sensation grossly intact in all extremities Cerebellar: normal finger-to-nose with bilateral upper extremities CV: distal pulses palpable throughout     Skin: Skin is warm. She is diaphoretic.  Psychiatric: She has a normal mood and affect. Her behavior is normal.    ED Course  Procedures (including critical care time) Labs Review Labs Reviewed  COMPREHENSIVE METABOLIC PANEL - Abnormal; Notable for the following:    Potassium 3.2 (*)    Glucose, Bld 152 (*)    Total Bilirubin 1.7 (*)    All other components within normal limits  CBC WITH DIFFERENTIAL/PLATELET  URINALYSIS, ROUTINE W REFLEX MICROSCOPIC (NOT AT [facility])  HEMOGLOBIN A1C  TROPONIN I  TROPONIN I  Randolm Idol, ED    Imaging Review Dg Chest 2 View  09/13/2015  CLINICAL DATA:  Chest pain. EXAM: CHEST  2 VIEW COMPARISON:  December 05, 2003. FINDINGS: The heart size and mediastinal contours are within normal limits. Both lungs are clear. No pneumothorax or pleural effusion is noted. Anterior osteophyte formation is noted in mid thoracic spine. IMPRESSION: No active cardiopulmonary disease. Electronically Signed   By: Marijo Conception, M.D.   On: 09/13/2015 10:48   I have personally reviewed and  evaluated these images and lab results as part of my medical decision-making.   EKG Interpretation   Date/Time:  Thursday September 13 2015 09:25:27 EDT Ventricular Rate:  60 PR Interval:  171 QRS Duration: 141 QT Interval:  471 QTC Calculation: 471 R Axis:   57 Text Interpretation:  Sinus rhythm Atrial premature complex Right bundle  branch block since last tracing no significant change Confirmed by BELFI   MD, MELANIE ([id number]) on 09/13/2015 11:18:42 AM      MDM   Final diagnoses:  Chest pain, unspecified chest pain type  Essential hypertension   Patient presents with right-sided chest pain with radiation into right shoulder and right back. She is afebrile. She appears pale, diaphoretic, in mild distress. She is mildly hypertensive, vital signs otherwise stable. CBC reassuring. CMP remarkable for mild hypokalemia. Bilirubin mildly elevated; however, no abdominal tenderness, negative Murphy's, positive bowel sounds - doubt GI etiology. Chest x-ray negative for pneumonia, pleural effusion, pneumothorax. Well's score 0, pain is not pleuritic in nature, doubt PE. EKG remarkable for right bundle-branch block, unchanged. Troponin negative. Heart score 4. Concern for possible cardiac etiology. Consulted TRH.  TRH consulted, agrees to admit for observation telemetry for further evaluation and cardiac rule out.    Roxanna Mew, PA-C 09/13/15 [place], MD 09/13/15 1515

## 2015-09-13 NOTE — H&P (Signed)
History and Physical    Amanda Butler [id number] DOB: Jul 19, 1955 DOA: 09/13/2015   PCP: Simona Huh, MD   Patient coming from/Resides with: Private residence/lives with daughter who is paraplegic secondary to transverse myelitis and patient is her primary caretaker  Chief Complaint: Chest pain  HPI: Amanda Butler is a 60 y.o. female with medical history significant for diet-controlled diabetes, hypertension, asthma, obesity, GERD and osteoarthritis of both knees limiting mobility who presents to the ER with reports of chest pain. Patient was awakened early this morning substernal chest pain associated with feeling like "an elephant sitting on my chest" plus shortness of breath, nausea and diaphoresis. Patient called EMS and en route was given 3 sublingual nitroglycerin. She was not given aspirin due to history of aspirin allergy with hives and anaphylaxis-type symptoms. By the time she arrived to the ER her chest pain had decreased from 10/10-6/10. Subsequent after arrival to ER patient is now having more pain and right arm. Patient reports she has had chronic exertional dyspnea secondary to obesity in problems mobilizing because of her knees. Her mother is currently hospitalized at this facility and she walked from the entrance of the hospital to her mother's room and became short-winded but she states this is typical for her. She reports despite having GERD last night symptoms were not typical for her GERD. She has not had any exertional dyspnea or chest pain recently. For the past few days she has felt sluggish without any clear-cut etiology. She's not had any constitutional symptoms or any cough.  ED Course:  PO 97.5-BP 135/59-pulse 57-respirations 12-room air saturations 100% Two-view chest x-ray: No acute disease Lab data: Sodium 140, potassium 3.2, BUN 8, creatinine 0.89, glucose 152, LFTs are normal except for total bilirubin 1.7, troponin 0.00, WBC 6400 with normal differential,  hemoglobin 14.2, platelets 177,000 Was given 3 sublingual nitroglycerin in route to hospital by EMS  Review of Systems:  In addition to the HPI above,  No Fever-chills, myalgias or other constitutional symptoms No Headache, changes with Vision or hearing, new weakness, tingling, numbness in any extremity, No problems swallowing food or Liquids, indigestion/reflux No Cough, palpitations, orthopnea or change in chronic DOE No Abdominal pain, N/V; no melena or hematochezia, no dark tarry stools No dysuria, hematuria or flank pain No new skin rashes, lesions, masses or bruises, No new joints pains-aches No recent weight gain or loss No polyuria, polydypsia or polyphagia,   Past Medical History  Diagnosis Date  . Diabetes mellitus   . Arthritis   . Asthma   . Hypertension   . Obesity     Past Surgical History  Procedure Laterality Date  . Tubal ligation    . Tendon release      RT Leg  . Bunionectomy    . Multiple tooth extractions       reports that she has never smoked. She has never used smokeless tobacco. She reports that she does not drink alcohol or use illicit drugs.  Mobility: Without assistive devices Work history: Stays at home and cares for her disabled daughter   Allergies  Allergen Reactions  . Bee Venom Anaphylaxis  . Shellfish Allergy Anaphylaxis  . Aspirin Hives  . Iodine     Doesn't remember   . Latex Hives  . Morphine And Related     Doesn't remember   . Nsaids Hives    Family History  Problem Relation Age of Onset  . Diabetes Mother   . Hypertension Mother   .  Diabetes Father   . Hypertension Father   . Heart attack Father   . Diabetes Maternal Aunt   . Diabetes Maternal Grandmother   . Hypertension Maternal Grandmother   . Cancer Maternal Grandfather   . Diabetes Maternal Grandfather   . Hypertension Maternal Grandfather   . Asthma Paternal Grandmother   . Diabetes Paternal Grandmother   . Hypertension Paternal Grandmother   .  Diabetes Paternal Grandfather   . Hypertension Paternal Grandfather   . Heart failure Paternal Grandfather      Prior to Admission medications   Medication Sig Start Date End Date Taking? Authorizing Provider  albuterol (PROVENTIL HFA;VENTOLIN HFA) 108 (90 BASE) MCG/ACT inhaler Inhale 2 puffs into the lungs 2 (two) times daily as needed.    Historical Provider, MD  budesonide-formoterol (SYMBICORT) 160-4.5 MCG/ACT inhaler Inhale 2 puffs into the lungs 2 (two) times daily as needed.    Historical Provider, MD  famotidine (PEPCID) 10 MG tablet Take 10 mg by mouth daily as needed.    Historical Provider, MD  HYDROcodone-acetaminophen (NORCO/VICODIN) 5-325 MG tablet Take 1 tablet by mouth every 4 (four) hours as needed for moderate pain. 05/18/15   Jola Schmidt, MD  metoprolol (LOPRESSOR) 50 MG tablet Take 50 mg by mouth daily.    Historical Provider, MD  mometasone (NASONEX) 50 MCG/ACT nasal spray Place 2 sprays into the nose daily. Daily as needed    Historical Provider, MD  triamterene-hydrochlorothiazide (MAXZIDE) 75-50 MG per tablet Take 1 tablet by mouth daily.    Historical Provider, MD    Physical Exam: Filed Vitals:   09/13/15 1100 09/13/15 1115 09/13/15 1130 09/13/15 1145  BP: 180/94 162/80 166/82 173/88  Pulse: 61 57 63 62  Temp:      TempSrc:      Resp: 21 14 21 17   SpO2: 92% 100% 99% 94%      Constitutional: NAD, calm, comfortable Eyes: PERRL, lids and conjunctivae normal ENMT: Mucous membranes are moist. Posterior pharynx clear of any exudate or lesions.Normal dentition.  Neck: normal, supple, no masses, no thyromegaly Respiratory: clear to auscultation bilaterally, no wheezing, no crackles. Normal respiratory effort. No accessory muscle use.  Cardiovascular: Regular rate and rhythm, no murmurs / rubs / gallops. Chronic bilateral lower extremity edema with brawny skin changes and ichthyosis consistent with stasis dermatitis. 2+ pedal pulses. No carotid bruits.  Abdomen: no  tenderness, no masses palpated. No hepatosplenomegaly. Bowel sounds positive.  Musculoskeletal: no clubbing / cyanosis. No joint deformity upper and lower extremities. Good ROM, no contractures. Normal muscle tone. Does have tenderness to palpation over right anterior chest wall and shoulder but this does not reproduce the same pain she was having at home Skin: no rashes, lesions, ulcers. No induration-brawny skin changes and ichthyosis as well as some subtle warmth and erythema to the left lower extremity below the knee Neurologic: CN 2-12 grossly intact. Sensation intact, DTR normal. Strength 5/5 x all 4 extremities.  Psychiatric: Normal judgment and insight. Alert and oriented x 3. Normal mood.    Labs on Admission: I have personally reviewed following labs and imaging studies  CBC:  Recent Labs Lab 09/13/15 1005  WBC 6.4  NEUTROABS 4.4  HGB 14.2  HCT 42.9  MCV 87.9  PLT 123XX123   Basic Metabolic Panel:  Recent Labs Lab 09/13/15 1005  NA 140  K 3.2*  CL 104  CO2 26  GLUCOSE 152*  BUN 8  CREATININE 0.89  CALCIUM 9.3   GFR: CrCl cannot be calculated (  Unknown ideal weight.). Liver Function Tests:  Recent Labs Lab 09/13/15 1005  AST 19  ALT 19  ALKPHOS 76  BILITOT 1.7*  PROT 7.1  ALBUMIN 3.7   No results for input(s): LIPASE, AMYLASE in the last 168 hours. No results for input(s): AMMONIA in the last 168 hours. Coagulation Profile: No results for input(s): INR, PROTIME in the last 168 hours. Cardiac Enzymes: No results for input(s): CKTOTAL, CKMB, CKMBINDEX, TROPONINI in the last 168 hours. BNP (last 3 results) No results for input(s): PROBNP in the last 8760 hours. HbA1C: No results for input(s): HGBA1C in the last 72 hours. CBG: No results for input(s): GLUCAP in the last 168 hours. Lipid Profile: No results for input(s): CHOL, HDL, LDLCALC, TRIG, CHOLHDL, LDLDIRECT in the last 72 hours. Thyroid Function Tests: No results for input(s): TSH, T4TOTAL,  FREET4, T3FREE, THYROIDAB in the last 72 hours. Anemia Panel: No results for input(s): VITAMINB12, FOLATE, FERRITIN, TIBC, IRON, RETICCTPCT in the last 72 hours. Urine analysis: No results found for: COLORURINE, APPEARANCEUR, LABSPEC, PHURINE, GLUCOSEU, HGBUR, BILIRUBINUR, KETONESUR, PROTEINUR, UROBILINOGEN, NITRITE, LEUKOCYTESUR Sepsis Labs: @LABRCNTIP (procalcitonin:4,lacticidven:4) )No results found for this or any previous visit (from the past 240 hour(s)).   Radiological Exams on Admission: Dg Chest 2 View  09/13/2015  CLINICAL DATA:  Chest pain. EXAM: CHEST  2 VIEW COMPARISON:  December 05, 2003. FINDINGS: The heart size and mediastinal contours are within normal limits. Both lungs are clear. No pneumothorax or pleural effusion is noted. Anterior osteophyte formation is noted in mid thoracic spine. IMPRESSION: No active cardiopulmonary disease. Electronically Signed   By: Marijo Conception, M.D.   On: 09/13/2015 10:48    EKG: (Independently reviewed) Sinus rhythm with ventricular rate 60 bpm, QTC 471 ms, underlying right bundle branch block, no ST segment elevation or T-wave changes that would be concerning for ischemia.  Assessment/Plan Principal Problem:   Chest pain -Patient has both typical and atypical features but also has significant personal risk factors of diabetes, HLD, hypertension, obesity as well as family history of cardiac disease; her heart score is 4 -Patient continues to have some substernal chest pain -begin Nitrol paste -Cycle troponin -Echocardiogram -NM stress Myoview in a.m.-given obesity likely will need to be 2 day study -Continue preadmission beta blocker -No aspirin secondary to significant allergy  Active Problems:   Bilateral lower extremity edema (chronic) -Left greater than right and given presentation with chest discomfort and shortness of breath will check d-dimer and lower stream in the venous duplex to rule out DVT/PE ** D dimer slightly elevated at  0.52; patient not hypoxic and not tachycardic so we'll need to follow up on lower stream and he duplex before determining if need to adjust current anticoagulation parameters    Stasis dermatitis of both legs/Left leg cellulitis -She has been having discomfort and slight cellulitic-type skin changes to left lower extremity below the knee 2 weeks -Ancef IV for possible cellulitis    Diabetes mellitus type 2, diet-controlled  -Follow CBGs and provide SSI -Hemoglobin A1c    Total bilirubin, elevated -Total bilirubin 1.7 with all other LFTs normal -Given presenting symptoms would like to rule out biliary etiology so check abdominal ultrasound    RBBB -Chronic and potentially reflective of underlying sleep apnea -Follow up on echocardiogram    Obesity -Weight reduction strategies and process with patient and PCP    HTN (hypertension) -Blood pressure well-controlled on preadmission Lopressor -Holding preadmission Maxide -Need to clarify if patient was on lisinopril prior to admission  Dyslipidemia -Need to clarify patient was taking Pravachol preadmission    Asthma -Currently not wheezing    GERD (gastroesophageal reflux disease) -Continue preadmission Pepcid prn -GI cocktail ordered prn      DVT prophylaxis: Lovenox Code Status: Full code Family Communication: With brother and sister at bedside with patient's permission  Disposition Plan: Anticipate discharge back to preadmission home environment was medically stable Consults called: None Admission status: Telemetry/inpatient (anticipate will need 2 day stress Myoview study)     ELLIS,ALLISON L. ANP-BC [facility] Pager [phone]   If 7PM-7AM, please contact night-coverage [email or web address] Password [facility]  09/13/2015, 12:17 PM

## 2015-09-13 NOTE — ED Notes (Signed)
Checked patient blood sugar it was 178 notifedRN of blood sugar

## 2015-09-13 NOTE — ED Notes (Signed)
Pt oob to BR ambulates with steady gait.

## 2015-09-13 NOTE — ED Notes (Signed)
Attempted to call report

## 2015-09-14 ENCOUNTER — Encounter (HOSPITAL_COMMUNITY): Payer: Self-pay | Admitting: Physician Assistant

## 2015-09-14 ENCOUNTER — Inpatient Hospital Stay (HOSPITAL_COMMUNITY): Payer: 59

## 2015-09-14 DIAGNOSIS — R17 Unspecified jaundice: Secondary | ICD-10-CM

## 2015-09-14 DIAGNOSIS — K219 Gastro-esophageal reflux disease without esophagitis: Secondary | ICD-10-CM | POA: Diagnosis not present

## 2015-09-14 DIAGNOSIS — I8312 Varicose veins of left lower extremity with inflammation: Secondary | ICD-10-CM

## 2015-09-14 DIAGNOSIS — R072 Precordial pain: Secondary | ICD-10-CM | POA: Diagnosis not present

## 2015-09-14 DIAGNOSIS — R079 Chest pain, unspecified: Secondary | ICD-10-CM | POA: Diagnosis not present

## 2015-09-14 DIAGNOSIS — E119 Type 2 diabetes mellitus without complications: Secondary | ICD-10-CM | POA: Diagnosis not present

## 2015-09-14 DIAGNOSIS — I8311 Varicose veins of right lower extremity with inflammation: Secondary | ICD-10-CM

## 2015-09-14 DIAGNOSIS — R600 Localized edema: Secondary | ICD-10-CM

## 2015-09-14 DIAGNOSIS — I4510 Unspecified right bundle-branch block: Secondary | ICD-10-CM

## 2015-09-14 DIAGNOSIS — I10 Essential (primary) hypertension: Secondary | ICD-10-CM

## 2015-09-14 LAB — GLUCOSE, CAPILLARY
Glucose-Capillary: 147 mg/dL — ABNORMAL HIGH (ref 65–99)
Glucose-Capillary: 154 mg/dL — ABNORMAL HIGH (ref 65–99)
Glucose-Capillary: 127 mg/dL — ABNORMAL HIGH (ref 65–99)
Glucose-Capillary: 159 mg/dL — ABNORMAL HIGH (ref 65–99)

## 2015-09-14 LAB — LIPID PANEL
Cholesterol: 165 mg/dL (ref 0–200)
HDL: 38 mg/dL — ABNORMAL LOW (ref >40)
LDL Cholesterol: 111 mg/dL — ABNORMAL HIGH (ref 0–99)
Total CHOL/HDL Ratio: 4.3 RATIO
Triglycerides: 82 mg/dL (ref <150)
VLDL: 16 mg/dL (ref 0–40)

## 2015-09-14 LAB — HEMOGLOBIN A1C
Hgb A1c MFr Bld: 7.4 % — ABNORMAL HIGH (ref 4.8–5.6)
Mean Plasma Glucose: 166 mg/dL

## 2015-09-14 LAB — TROPONIN I: Troponin I: <0.03 ng/mL (ref <0.031)

## 2015-09-14 MED ORDER — HEPARIN (PORCINE) IN NACL 100-0.45 UNIT/ML-% IJ SOLN
1500.0000 [IU]/h | INTRAMUSCULAR | Status: DC
  Administered 2015-09-14 – 2015-09-16 (×2): 1500 [IU]/h via INTRAVENOUS
  Filled 2015-09-14 (×3): qty 250

## 2015-09-14 MED ORDER — FUROSEMIDE 20 MG PO TABS
20.0000 mg | ORAL_TABLET | Freq: Every day | ORAL | Status: DC
  Administered 2015-09-14 – 2015-09-16 (×3): 20 mg via ORAL
  Filled 2015-09-14 (×3): qty 1

## 2015-09-14 MED ORDER — REGADENOSON 0.4 MG/5ML IV SOLN
INTRAVENOUS | Status: AC
Start: 2015-09-14 — End: 2015-09-14
  Filled 2015-09-14: qty 5

## 2015-09-14 MED ORDER — TECHNETIUM TC 99M TETROFOSMIN IV KIT
30.0000 | PACK | Freq: Once | INTRAVENOUS | Status: AC | PRN
  Administered 2015-09-14: 30 via INTRAVENOUS

## 2015-09-14 MED ORDER — REGADENOSON 0.4 MG/5ML IV SOLN
0.4000 mg | Freq: Once | INTRAVENOUS | Status: AC
  Administered 2015-09-14: 0.4 mg via INTRAVENOUS
  Filled 2015-09-14: qty 5

## 2015-09-14 MED ORDER — HEPARIN BOLUS VIA INFUSION
4000.0000 [IU] | Freq: Once | INTRAVENOUS | Status: AC
  Administered 2015-09-14: 4000 [IU] via INTRAVENOUS
  Filled 2015-09-14: qty 4000

## 2015-09-14 NOTE — Progress Notes (Signed)
Patient is admitted because of chest pain. Has an elevated d-dimer, need to rule out PE. CTA is ordered, but patient is allergic to contrast, can not do CTA-chest now.  -will start IV heparin -do V/Q in AM.  Ivor Costa, MD  [facility] Pager [phone]  If 7PM-7AM, please contact night-coverage [email or web address] Password [facility] 09/14/2015, 9:29 PM

## 2015-09-14 NOTE — Consult Note (Signed)
CARDIOLOGY CONSULT NOTE   Patient ID: Amanda Butler MRN: [id number] DOB/AGE: 62-Oct-1957 62 y.o.  Admit date: 09/13/2015  Primary Physician   Amanda Huh, MD Primary Cardiologist   New Reason for Consultation   Chest pain Requesting MD: Dr Amanda Butler  [id number] Amanda Butler is a 60 y.o. year old female with a history of HTN, DM, obesity, FH CAD, RBBB, GERD, asthma, OA, was admitted 06/01 with chest pain, cards asked to see.  Pt woke with chest pain yesterday about 8:30 am. It was a very strong squeeze and pressure. She drank water, which she uses to treat reflux symptoms but it made no difference. Worse with deep inspiration. Radiated into her Amanda shoulder. Associated with diaphoresis and pt felt SOB. 10/10 at first, EMS called, no ASA given due to allergy. Pt had SL NTG x 3 and pain was 7/10 by arrival in the ER. Nitro paste in ER and pain gradually resolved. It has not returned.  BP on arrival to the ER was 170/84, now improved.   She has never had this pain before. Her daughter (paraplegic 2nd transverse myelitis form of MS) requires lifting to get her from the bed to chair and back again. She never gets chest pain doing this. Pt states the pain was different from her reflux, which is a burning pain in her xyphoid area. She will drink water and occasionally juice to make it go away. She takes Prilosec daily for reflux and will only get symptoms about once a week. She cannot climb a flight of steps without stopping due to SOB. She cannot walk a block without stopping for breath. When she does something that makes her SOB, she never gets chest pain. She has been told that she snores.  She was also the primary caregiver for her husband, who died of Alzheimer's 3 years ago. She sleeps on the couch every night (to make sure she hears if her daughter needs her) and her L foot ends up on the floor regularly. It swells more than the Amanda foot, chronically. She has been losing weight slowly. She  used to weigh 298 but has lost down to 273 after Dr Amanda Butler told her she would have to take diabetes medicine if she didn't.   Past Medical History  Diagnosis Date  . Asthma   . Hypertension   . Obesity   . High cholesterol   . Pneumonia ~ 2000    "mild case"  . Type II diabetes mellitus ([place])   . GERD (gastroesophageal reflux disease)   . Arthritis     "knees" (09/13/2015)  . RBBB (right bundle branch block) approx 2010    Dr Amanda Butler did ECG and told her     Past Surgical History  Procedure Laterality Date  . Tendon release Right     Leg  . Bunionectomy Left   . Multiple tooth extractions  ~ 1975  . Tubal ligation  ~ 1983    Allergies  Allergen Reactions  . Bee Venom Anaphylaxis  . Shellfish Allergy Anaphylaxis  . Aspirin Hives  . Iodine     Doesn't remember   . Latex Hives  . Morphine And Related     Doesn't remember   . Nsaids Hives    I have reviewed the patient's current medications . enoxaparin (LOVENOX) injection  40 mg Subcutaneous Q24H  . fluticasone  1 spray Each Nare Daily  . insulin aspart  0-5 Units Subcutaneous QHS  . insulin aspart  0-9 Units Subcutaneous TID WC  . metoprolol succinate  50 mg Oral Daily  . mometasone-formoterol  2 puff Inhalation BID  . nitroGLYCERIN  0.5 inch Topical Q6H     acetaminophen, albuterol, famotidine, gi cocktail, ondansetron (ZOFRAN) IV  Prior to Admission medications   Medication Sig Start Date End Date Taking? Authorizing Provider  albuterol (PROVENTIL HFA;VENTOLIN HFA) 108 (90 BASE) MCG/ACT inhaler Inhale 2 puffs into the lungs 2 (two) times daily as needed.   Yes Historical Provider, MD  famotidine (PEPCID) 10 MG tablet Take 10 mg by mouth daily as needed.   Yes Historical Provider, MD  lisinopril (PRINIVIL,ZESTRIL) 5 MG tablet Take 5 mg by mouth daily.   Yes Historical Provider, MD  metoprolol succinate (TOPROL-XL) 50 MG 24 hr tablet Take 50 mg by mouth daily. Take with or immediately following a meal.   Yes  Historical Provider, MD  mometasone (NASONEX) 50 MCG/ACT nasal spray Place 2 sprays into the nose daily. Daily as needed   Yes Historical Provider, MD  pravastatin (PRAVACHOL) 20 MG tablet Take 20 mg by mouth daily.   Yes Historical Provider, MD  triamterene-hydrochlorothiazide (MAXZIDE) 75-50 MG per tablet Take 1 tablet by mouth daily.   Yes Historical Provider, MD     Social History   Social History  . Marital Status: Widowed    Spouse Name: N/A  . Number of Children: N/A  . Years of Education: N/A   Occupational History  . 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 Smoking status: Never Smoker   . Smokeless tobacco: Never Used  . Alcohol Use: No  . Drug Use: No  . Sexual Activity: No   Other Topics Concern  . Not on file   Social History Narrative   Pt lives with disabled daughter, is her primary caregiver.    Family Status  Relation Status Death Age  . Mother Alive   . Father Deceased 30   Family History  Problem Relation Age of Onset  . Diabetes Mother   . Hypertension Mother   . Diabetes Father   . Hypertension Father   . Heart attack Father 65  . Diabetes Maternal Aunt   . Diabetes Maternal Grandmother   . Hypertension Maternal Grandmother   . Cancer Maternal Grandfather   . Diabetes Maternal Grandfather   . Hypertension Maternal Grandfather   . Asthma Paternal Grandmother   . Diabetes Paternal Grandmother   . Hypertension Paternal Grandmother   . Diabetes Paternal Grandfather   . Hypertension Paternal Grandfather   . Heart failure Paternal Grandfather      ROS:  Full 14 point review of systems complete and found to be negative unless listed above.  Physical Exam: Blood pressure 152/67, pulse 62, temperature 98.5 F (36.9 C), temperature source Oral, resp. rate 18, height 5\' 6"  (1.676 m), weight 273 lb 6.4 oz (124.013 kg), SpO2 99 %.  General: Well developed, well nourished, female in no acute distress Head: Eyes  PERRLA, No xanthomas.   Normocephalic and atraumatic, oropharynx without edema or exudate. Dentition: poor Lungs: decreased BS bases Heart: HRRR S1 S2, no rub/gallop, no murmur. pulses are 2+ all 4 extrem.   Neck: No carotid bruits. No lymphadenopathy.  JVD not elevated Abdomen: Bowel sounds present, abdomen soft and mildly tender without masses or hernias noted. Msk:  No spine or cva tenderness. No weakness, no joint deformities or effusions. Extremities: No clubbing or cyanosis. No edema.  Neuro: Alert  and oriented X 3. No focal deficits noted. Psych:  Good affect, responds appropriately Skin: No rashes or lesions noted.  Labs:   Lab Results  Component Value Date   WBC 6.4 09/13/2015   HGB 14.2 09/13/2015   HCT 42.9 09/13/2015   MCV 87.9 09/13/2015   PLT 177 09/13/2015     Recent Labs Lab 09/13/15 1005  NA 140  K 3.2*  CL 104  CO2 26  BUN 8  CREATININE 0.89  CALCIUM 9.3  PROT 7.1  BILITOT 1.7*  ALKPHOS 76  ALT 19  AST 19  GLUCOSE 152*  ALBUMIN 3.7   No results found for: MG  Recent Labs  09/13/15 1533 09/13/15 2158 09/14/15 0430  TROPONINI <0.03 <0.03 <0.03    Recent Labs  09/13/15 1011  TROPIPOC 0.00   Lab Results  Component Value Date   CHOL 165 09/14/2015   HDL 38* 09/14/2015   LDLCALC 111* 09/14/2015   TRIG 82 09/14/2015   Lab Results  Component Value Date   DDIMER 0.52* 09/13/2015    Echo: ordered  ECG:  SR, rate 60, RBBB is old (per pt, no ECGs in system)  Cath: n/a  Radiology:  Dg Chest 2 View 09/13/2015  CLINICAL DATA:  Chest pain. EXAM: CHEST  2 VIEW COMPARISON:  December 05, 2003. FINDINGS: The heart size and mediastinal contours are within normal limits. Both lungs are clear. No pneumothorax or pleural effusion is noted. Anterior osteophyte formation is noted in mid thoracic spine. IMPRESSION: No active cardiopulmonary disease. Electronically Signed   By: Marijo Conception, M.D.   On: 09/13/2015 10:48    ASSESSMENT AND PLAN:   The  patient was seen today by Dr Sallyanne Kuster, the patient evaluated and the data reviewed.  Principal Problem:   Chest pain - no hx exertional chest pain despite sig exertion helping her daughter. - chronic DOE, no recent change - Ez neg MI, ECG w/ old RBBB, no old ECG present - think MV OK, would be 2 day study. - echo ordered as well - f/u on results - if all is ok, unclear cause of sx, possibly GI.  Active Problems:   Bilateral lower extremity edema - takes diuretic daily - volume status good on exam  Otherwise per IM.   Diabetes mellitus type 2, diet-controlled (HCC)   Obesity   Total bilirubin, elevated   HTN (hypertension)   Stasis dermatitis of both legs   Left leg cellulitis   RBBB   Asthma   GERD (gastroesophageal reflux disease)   Chest pain syndrome  Signed: Lenoard Aden 09/14/2015 8:22 AM Beeper [phone]  I have seen and examined the patient along with Barrett, Suanne Marker, PA-C.  I have reviewed the chart, notes and new data.  I agree with PA's note.  Key new complaints: chest pain was atypical (occurred at rest, worse with deep breathing) Key examination changes: morbid obesity, brawny edema left calf>right Key new findings / data: low risk ECG with RBBB (she reports as old, but no old tracing), normal cardiac enzymes and minimally elevated D-dimer.  PLAN: Symptoms are not typical, but risk factor profile is compelling. Nuclear perfusion study is appropriate.  Will not have the results of the nuclear scan until later in the day tomorrow (2 day study). Echo is not yet done. Venous Dopplers have been ordered, but are also yet to be performed. Seems to have findings of chronic right heart failure and superimposed venous insufficiency. May need sleep apnea evaluation. She  is at risk for DVT/PE and although the D-dimer was only borderline high, I think it is important to exclude this possible cause of her chest pain.  Sanda Klein, MD, [place] [phone] 09/14/2015, 10:11 AM

## 2015-09-14 NOTE — Progress Notes (Addendum)
ANTICOAGULATION CONSULT NOTE - Initial Consult  Pharmacy Consult for heparin Indication: pulmonary embolus  Allergies  Allergen Reactions  . Bee Venom Anaphylaxis  . Shellfish Allergy Anaphylaxis  . Aspirin Hives  . Iodine     Doesn't remember   . Latex Hives  . Morphine And Related     Doesn't remember   . Nsaids Hives    Patient Measurements: Height: 5\' 6"  (167.6 cm) Weight: 273 lb 6.4 oz (124.013 kg) IBW/kg (Calculated) : 59.3 Heparin Dosing Weight: 89kg  Vital Signs: Temp: 98 F (36.7 C) (06/02 2100) Temp Source: Oral (06/02 2100) BP: 128/67 mmHg (06/02 2100) Pulse Rate: 66 (06/02 2100)  Labs:  Recent Labs  09/13/15 1005 09/13/15 1533 09/13/15 2158 09/14/15 0430  HGB 14.2  --   --   --   HCT 42.9  --   --   --   PLT 177  --   --   --   CREATININE 0.89  --   --   --   TROPONINI  --  <0.03 <0.03 <0.03    Estimated Creatinine Clearance: 88.2 mL/min (by C-G formula based on Cr of 0.89).    Assessment: 62 YOF with elevated d-dimer and chest pain. Unable to do CTA d/t allergy, waiting until morning to perform VQ scan to r/o PR> starting heparin. She did receive a dose of Lovenox 40mg  subQ at 1500 (below recommended prophylactic dose d/t her weight). She is not on anticoagulation PTA. Baseline Hgb 14.2, plts 177.  Goal of Therapy:  Heparin level 0.3-0.7 units/ml Monitor platelets by anticoagulation protocol: Yes   Plan:  -heparin 4000 unit bolus, then start IV infusion at 1500 units/hr -first heparin level with AM labs -daily HL and CBC -follow up VQ results and plans for long term AC  Willard Madrigal D. Babyboy Loya, PharmD, BCPS Clinical Pharmacist Pager: [phone] 09/14/2015 9:41 PM

## 2015-09-14 NOTE — Progress Notes (Addendum)
PROGRESS NOTE  Amanda Butler  [id number] DOB: 10-10-1955  DOA: 09/13/2015 PCP: Simona Huh, MD   Brief Narrative:  60 year old female patient with a PMH of diet-controlled DM 2, asthma, HTN, morbid obesity, GERD, lives at home with disabled daughter and is her primary caregiver and physically active enough to take care of her, presented to [facility] ED on 09/13/15 with new onset chest pain which awakened her at 8 AM on day of admission. She describes chest pain as mid substernal, severe 10/10 in severity and felt like "an elephant sitting on my chest", associated right shoulder pain, nausea and diaphoresis. Drinking water did not help relieve pain and hence unlike her GERD symptoms. No aspirin given due to history of allergy. Some improvement with sublingual NTG provided by EMS. Chest pain reduced to 6/10 by the time she came to the ED and eventually resolved sometime overnight without recurrence. Denies abdominal pain. Has chronic dyspnea. Never been tested for sleep apnea. Lifelong nonsmoker. Admitted for chest pain evaluation. Cardiology consulted and undergoing 2 day stress test.    Assessment & Plan:   Principal Problem:   Chest pain Active Problems:   Bilateral lower extremity edema   Diabetes mellitus type 2, diet-controlled (HCC)   Obesity   Total bilirubin, elevated   HTN (hypertension)   Stasis dermatitis of both legs   Left leg cellulitis   RBBB   Asthma   GERD (gastroesophageal reflux disease)   Chest pain syndrome   Pain in the chest   Essential hypertension   Morbid obesity due to excess calories (HCC)   Chest pain - Has both typical and atypical features for CAD. - CAD risk factors: DM, HTN, HLD, obesity, strong family history (father died at age 73 from MI and mother alive and has a pacemaker)  - troponin cycle 3 and negative. - Chest pain resolved. Currently on MTP patch and metoprolol. - DD: CAD, musculoskeletal, GERD, low index of suspicion for VTE even though  d-dimer minimally elevated at 0.52 as patient was not hypoxic nor tachycardic on initial evaluation, however obtaining lower extremity venous Dopplers to rule out DVT.  Poorly controlled DM 2 - A1c 7.4. Reasonable CBG controls. Continue SSI. Consider oral hypoglycemics as outpatient.  Essential hypertension -Mildly uncontrolled. Continue metoprolol. Consider adding low-dose diuretics which should help her chronic lower extremity venous stasis/edema.  Hyperlipidemia - LDL 111. Consider statins if rules in for CAD.    Asthma  - Stable.   Morbid obesity/Body mass index is 44.15 kg/(m^2). - Consider outpatient sleep study.  RBBB, chronic  GERD - Continue home when necessary Pepcid.  Mildly elevated bilirubin  - No GI symptoms. Total bilirubin 1.7. Other LFTs normal. RUQ ultrasound without acute findings.  Chronic lower extremity edema - Start low-dose Lasix. Although mentioned, was never started on antibiotics. No indication for antibiotics.  Hypokalemia - Replace and follow.   DVT prophylaxis: Lovenox Code Status: Full Family Communication: Discussed with patient. No family at bedside.  Disposition Plan: DC home when medically stable.    Consultants:   Cardiology   Procedures:   None   Antimicrobials:   None   Subjective: Could not see patient this morning because she was out for stress test. Seen later this afternoon. States that she has not had any chest pain since last night. Mild intermittent right shoulder pain. No dyspnea-does have chronic unchanged DOA. Chronic leg swelling.   Objective:  Filed Vitals:   09/14/15 0940 09/14/15 0941 09/14/15 1112 09/14/15 1232  BP: 164/79  136/61 141/58  Pulse: 76 79 62 52  Temp:    98.7 F (37.1 C)  TempSrc:    Oral  Resp:    20  Height:      Weight:      SpO2:    100%    Intake/Output Summary (Last 24 hours) at 09/14/15 1509 Last data filed at 09/14/15 1502  Gross per 24 hour  Intake    680 ml  Output    1500 ml  Net   -820 ml   Filed Weights   09/13/15 1413 09/14/15 0600  Weight: 124.059 kg (273 lb 8 oz) 124.013 kg (273 lb 6.4 oz)    Examination:  General exam: Pleasant middle-aged female, moderately built and obese, sitting up comfortably in bed.  Respiratory system: Clear to auscultation. Respiratory effort normal. Cardiovascular system: S1 & S2 heard, RRR. No JVD, murmurs, rubs, gallops or clicks. Trace bilateral pitting edema left >right. No other acute findings suggestive of cellulitis.  Gastrointestinal system: Abdomen is nondistended, soft and nontender. No organomegaly or masses felt. Normal bowel sounds heard. Central nervous system: Alert and oriented. No focal neurological deficits. Extremities: Symmetric 5 x 5 power. Skin: No rashes, lesions or ulcers Psychiatry: Judgement and insight appear normal. Mood & affect appropriate.     Data Reviewed: I have personally reviewed following labs and imaging studies  CBC:  Recent Labs Lab 09/13/15 1005  WBC 6.4  NEUTROABS 4.4  HGB 14.2  HCT 42.9  MCV 87.9  PLT 123XX123   Basic Metabolic Panel:  Recent Labs Lab 09/13/15 1005  NA 140  K 3.2*  CL 104  CO2 26  GLUCOSE 152*  BUN 8  CREATININE 0.89  CALCIUM 9.3   GFR: Estimated Creatinine Clearance: 88.2 mL/min (by C-G formula based on Cr of 0.89). Liver Function Tests:  Recent Labs Lab 09/13/15 1005  AST 19  ALT 19  ALKPHOS 76  BILITOT 1.7*  PROT 7.1  ALBUMIN 3.7   No results for input(s): LIPASE, AMYLASE in the last 168 hours. No results for input(s): AMMONIA in the last 168 hours. Coagulation Profile: No results for input(s): INR, PROTIME in the last 168 hours. Cardiac Enzymes:  Recent Labs Lab 09/13/15 1533 09/13/15 2158 09/14/15 0430  TROPONINI <0.03 <0.03 <0.03   BNP (last 3 results) No results for input(s): PROBNP in the last 8760 hours. HbA1C:  Recent Labs  09/13/15 1005  HGBA1C 7.4*   CBG:  Recent Labs Lab 09/13/15 1237  09/13/15 1632 09/13/15 2220 09/14/15 0735 09/14/15 1141  GLUCAP 178* 142* 152* 147* 154*   Lipid Profile:  Recent Labs  09/14/15 0430  CHOL 165  HDL 38*  LDLCALC 111*  TRIG 82  CHOLHDL 4.3   Thyroid Function Tests: No results for input(s): TSH, T4TOTAL, FREET4, T3FREE, THYROIDAB in the last 72 hours. Anemia Panel: No results for input(s): VITAMINB12, FOLATE, FERRITIN, TIBC, IRON, RETICCTPCT in the last 72 hours.  Sepsis Labs: No results for input(s): PROCALCITON, LATICACIDVEN in the last 168 hours.  Recent Results (from the past 240 hour(s))  MRSA PCR Screening     Status: None   Collection Time: 09/13/15  2:45 PM  Result Value Ref Range Status   MRSA by PCR NEGATIVE NEGATIVE Final    Comment:        The GeneXpert MRSA Assay (FDA approved for NASAL specimens only), is one component of a comprehensive MRSA colonization surveillance program. It is not intended to diagnose MRSA infection nor to  guide or monitor treatment for MRSA infections.          Radiology Studies: Dg Chest 2 View  09/13/2015  CLINICAL DATA:  Chest pain. EXAM: CHEST  2 VIEW COMPARISON:  December 05, 2003. FINDINGS: The heart size and mediastinal contours are within normal limits. Both lungs are clear. No pneumothorax or pleural effusion is noted. Anterior osteophyte formation is noted in mid thoracic spine. IMPRESSION: No active cardiopulmonary disease. Electronically Signed   By: Marijo Conception, M.D.   On: 09/13/2015 10:48   US Abdomen Limited Ruq  09/14/2015  CLINICAL DATA:  Elevated bili Rubin. Hypertension and diabetes. Obesity. EXAM: US ABDOMEN LIMITED - RIGHT UPPER QUADRANT COMPARISON:  None. FINDINGS: Gallbladder: No gallstones or wall thickening visualized. No sonographic Murphy sign noted by sonographer. Common bile duct: Diameter: 8 mm.  No visible stone or obstructing lesion. Liver: No focal lesion identified. Within normal limits in parenchymal echogenicity. IMPRESSION: Normal appearance  of the gallbladder. Normal appearance of the liver. Common duct slightly prominent at 8 mm. A ductal stone or pancreatic head mass is not seen, but those possibilities do exist. Consider MRI MRCP versus ERCP. Electronically Signed   By: Nelson Chimes M.D.   On: 09/14/2015 09:08        Scheduled Meds: . enoxaparin (LOVENOX) injection  40 mg Subcutaneous Q24H  . fluticasone  1 spray Each Nare Daily  . insulin aspart  0-5 Units Subcutaneous QHS  . insulin aspart  0-9 Units Subcutaneous TID WC  . metoprolol succinate  50 mg Oral Daily  . mometasone-formoterol  2 puff Inhalation BID  . nitroGLYCERIN  0.5 inch Topical Q6H  . regadenoson       Continuous Infusions:    LOS: 1 day    Time spent: 40 minutes.    [facility], MD [facility] Pager [phone]  If 7PM-7AM, please contact night-coverage [email or web address] Password [email or web address] 09/14/2015, 3:09 PM

## 2015-09-15 ENCOUNTER — Observation Stay (HOSPITAL_BASED_OUTPATIENT_CLINIC_OR_DEPARTMENT_OTHER): Payer: 59

## 2015-09-15 DIAGNOSIS — R600 Localized edema: Secondary | ICD-10-CM | POA: Diagnosis not present

## 2015-09-15 DIAGNOSIS — R079 Chest pain, unspecified: Secondary | ICD-10-CM

## 2015-09-15 DIAGNOSIS — R072 Precordial pain: Secondary | ICD-10-CM | POA: Diagnosis not present

## 2015-09-15 DIAGNOSIS — I10 Essential (primary) hypertension: Secondary | ICD-10-CM | POA: Diagnosis not present

## 2015-09-15 DIAGNOSIS — M7989 Other specified soft tissue disorders: Secondary | ICD-10-CM

## 2015-09-15 DIAGNOSIS — I5032 Chronic diastolic (congestive) heart failure: Secondary | ICD-10-CM

## 2015-09-15 DIAGNOSIS — E119 Type 2 diabetes mellitus without complications: Secondary | ICD-10-CM | POA: Diagnosis not present

## 2015-09-15 LAB — COMPREHENSIVE METABOLIC PANEL
ALT: 17 U/L (ref 14–54)
AST: 15 U/L (ref 15–41)
Albumin: 3.4 g/dL — ABNORMAL LOW (ref 3.5–5.0)
Alkaline Phosphatase: 70 U/L (ref 38–126)
Anion gap: 8 (ref 5–15)
BUN: 9 mg/dL (ref 6–20)
CO2: 29 mmol/L (ref 22–32)
Calcium: 9.2 mg/dL (ref 8.9–10.3)
Chloride: 102 mmol/L (ref 101–111)
Creatinine, Ser: 0.95 mg/dL (ref 0.44–1.00)
GFR calc Af Amer: >60 mL/min (ref >60)
GFR calc non Af Amer: >60 mL/min (ref >60)
Glucose, Bld: 134 mg/dL — ABNORMAL HIGH (ref 65–99)
Potassium: 3.5 mmol/L (ref 3.5–5.1)
Sodium: 139 mmol/L (ref 135–145)
Total Bilirubin: 1.3 mg/dL — ABNORMAL HIGH (ref 0.3–1.2)
Total Protein: 6.8 g/dL (ref 6.5–8.1)

## 2015-09-15 LAB — ECHOCARDIOGRAM COMPLETE
Height: 66 in
Weight: 4297.6 oz

## 2015-09-15 LAB — CBC
HCT: 41.6 % (ref 36.0–46.0)
Hemoglobin: 13.7 g/dL (ref 12.0–15.0)
MCH: 29.3 pg (ref 26.0–34.0)
MCHC: 32.9 g/dL (ref 30.0–36.0)
MCV: 89.1 fL (ref 78.0–100.0)
Platelets: 187 10*3/uL (ref 150–400)
RBC: 4.67 MIL/uL (ref 3.87–5.11)
RDW: 14.1 % (ref 11.5–15.5)
WBC: 8.5 10*3/uL (ref 4.0–10.5)

## 2015-09-15 LAB — GLUCOSE, CAPILLARY
Glucose-Capillary: 122 mg/dL — ABNORMAL HIGH (ref 65–99)
Glucose-Capillary: 172 mg/dL — ABNORMAL HIGH (ref 65–99)
Glucose-Capillary: 155 mg/dL — ABNORMAL HIGH (ref 65–99)
Glucose-Capillary: 162 mg/dL — ABNORMAL HIGH (ref 65–99)

## 2015-09-15 LAB — HEPARIN LEVEL (UNFRACTIONATED)
Heparin Unfractionated: 0.55 IU/mL (ref 0.30–0.70)
Heparin Unfractionated: 0.38 IU/mL (ref 0.30–0.70)

## 2015-09-15 MED ORDER — LISINOPRIL 5 MG PO TABS
5.0000 mg | ORAL_TABLET | Freq: Every day | ORAL | Status: DC
  Administered 2015-09-15 – 2015-09-16 (×2): 5 mg via ORAL
  Filled 2015-09-15 (×2): qty 1

## 2015-09-15 MED ORDER — TECHNETIUM TC 99M TETROFOSMIN IV KIT
30.0000 | PACK | Freq: Once | INTRAVENOUS | Status: AC | PRN
  Administered 2015-09-15: 30 via INTRAVENOUS

## 2015-09-15 NOTE — Progress Notes (Signed)
ANTICOAGULATION CONSULT NOTE - Follow Up Consult  Pharmacy Consult for heparin Indication: pulmonary embolus  Allergies  Allergen Reactions  . Bee Venom Anaphylaxis  . Shellfish Allergy Anaphylaxis  . Aspirin Hives  . Iodine     Doesn't remember   . Latex Hives  . Morphine And Related     Doesn't remember   . Nsaids Hives    Patient Measurements: Height: 5\' 6"  (167.6 cm) Weight: 268 lb 9.6 oz (121.836 kg) (scale a ) IBW/kg (Calculated) : 59.3 Heparin Dosing Weight: 89kg  Vital Signs: Temp: 98.5 F (36.9 C) (06/03 0850) Temp Source: Oral (06/03 0850) BP: 146/73 mmHg (06/03 0850) Pulse Rate: 62 (06/03 0850)  Labs:  Recent Labs  09/13/15 1005 09/13/15 1533 09/13/15 2158 09/14/15 0430 09/15/15 0313 09/15/15 0943  HGB 14.2  --   --   --  13.7  --   HCT 42.9  --   --   --  41.6  --   PLT 177  --   --   --  187  --   HEPARINUNFRC  --   --   --   --  0.55 0.38  CREATININE 0.89  --   --   --  0.95  --   TROPONINI  --  <0.03 <0.03 <0.03  --   --     Estimated Creatinine Clearance: 81.7 mL/min (by C-G formula based on Cr of 0.95).    Assessment: 9 YOF with elevated d-dimer and chest pain. Unable to do CTA d/t allergy, waiting until morning to perform VQ scan to r/o PE> started heparin. She did receive a dose of Lovenox 40mg  subQ at 1500 (below recommended prophylactic dose d/t her weight). She is not on anticoagulation PTA. Baseline Hgb 14.2, plts 177.  Heparin drip 1500 uts/hr initial HL 0.55 recheck HL 0.38 remains at goal will follow up VQ results  Goal of Therapy:  Heparin level 0.3-0.7 units/ml Monitor platelets by anticoagulation protocol: Yes   Plan:  -Continue heparin infusion at 1500 units/hr -daily HL and CBC -follow up VQ results and plans for long term AC  [employer].D. CPP, BCPS Clinical Pharmacist [phone] 09/15/2015 11:44 AM

## 2015-09-15 NOTE — Progress Notes (Signed)
PROGRESS NOTE  Amanda Butler  [id number] DOB: 1955-09-22  DOA: 09/13/2015 PCP: Simona Huh, MD   Brief Narrative:  60 year old female patient with a PMH of diet-controlled DM 2, asthma, HTN, morbid obesity, GERD, lives at home with disabled daughter and is her primary caregiver and physically active enough to take care of her, presented to [facility] ED on 09/13/15 with new onset chest pain which awakened her at 8 AM on day of admission. She describes chest pain as mid substernal, severe 10/10 in severity and felt like "an elephant sitting on my chest", associated right shoulder pain, nausea and diaphoresis. Drinking water did not help relieve pain and hence unlike her GERD symptoms. No aspirin given due to history of allergy. Some improvement with sublingual NTG provided by EMS. Chest pain reduced to 6/10 by the time she came to the ED and eventually resolved sometime overnight without recurrence. Denies abdominal pain. Has chronic dyspnea. Never been tested for sleep apnea. Lifelong nonsmoker. Admitted for chest pain evaluation. Cardiology consulted and undergoing 2 day stress test.    Assessment & Plan:   Principal Problem:   Chest pain Active Problems:   Bilateral lower extremity edema   Diabetes mellitus type 2, diet-controlled (HCC)   Obesity   Total bilirubin, elevated   HTN (hypertension)   Stasis dermatitis of both legs   Left leg cellulitis   RBBB   Asthma   GERD (gastroesophageal reflux disease)   Chest pain syndrome   Pain in the chest   Essential hypertension   Morbid obesity due to excess calories (HCC)   Chest pain - Has both typical and atypical features for CAD. - CAD risk factors: DM, HTN, HLD, obesity, strong family history (father died at age 59 from MI and mother alive and has a pacemaker)  - troponin cycle 3 and negative. - Chest pain resolved. Currently on NTP patch and metoprolol. - DD: CAD, musculoskeletal, GERD, low index of suspicion for VTE even though  d-dimer minimally elevated at 0.52 as patient was not hypoxic nor tachycardic on initial evaluation, however however still on the differentials and hence CTA chest requested but canceled due to concern for contrast allergy. Lower extremity venous Dopplers negative. -  follow nuclear stress test results. VQ scan ordered but can be done only on 6/6 due to radiation exposure from stress test. Patient was empirically started on IV heparin infusion overnight.  Poorly controlled DM 2 - A1c 7.4. Reasonable CBG controls. Continue SSI. Consider oral hypoglycemics as outpatient.  Essential hypertension -Mildly uncontrolled. Continue metoprolol. Resume home dose of lisinopril. Lasix 20 MG daily started. Monitor   Hyperlipidemia - LDL 111. Consider statins if rules in for CAD.    Asthma  - Stable.   Morbid obesity/Body mass index is 43.37 kg/(m^2). - Consider outpatient sleep study.  RBBB, chronic  GERD - Continue home when necessary Pepcid.  Mildly elevated bilirubin  - No GI symptoms. Total bilirubin 1.7. Other LFTs normal. RUQ ultrasound without acute findings. improved bilirubin.   Chronic lower extremity edema - Started low-dose Lasix. Improving. Edema felt to be venous insufficiency.  Hypokalemia - Replace and follow.  Diastolic dysfunction - Seen on echo. However lower extremity edema felt to be due to venous insufficiency.   DVT prophylaxis: Lovenox Code Status: Full Family Communication: Discussed with patient. No family at bedside.  Disposition Plan: DC home when medically stable.    Consultants:   Cardiology   Procedures:   Bilateral lower extremity venous duplex completed.  Preliminary report: There is no DVT or SVT noted in the bilateral lower extremities.    2-D echo 09/15/15: Study Conclusions  - Left ventricle: The cavity size was normal. Wall thickness was  increased in a pattern of mild LVH. Systolic function was normal.  The estimated ejection  fraction was in the range of 55% to 60%.  Wall motion was normal; there were no regional wall motion  abnormalities. Features are consistent with a pseudonormal left  ventricular filling pattern, with concomitant abnormal relaxation  and increased filling pressure (grade 2 diastolic dysfunction). - Mitral valve: There was mild regurgitation directed centrally. - Left atrium: The atrium was mildly dilated.   Antimicrobials:   None   Subjective:  Diuresed well overnight. No dyspnea or chest pain. Leg swelling decreasing. Seen prior to stress test this morning.   Objective:  Filed Vitals:   09/14/15 2348 09/15/15 0327 09/15/15 0850 09/15/15 1208  BP: 154/64 153/70 146/73 139/66  Pulse: 56 60 62 60  Temp: 98.1 F (36.7 C) 98.2 F (36.8 C) 98.5 F (36.9 C) 98 F (36.7 C)  TempSrc: Oral Oral Oral Oral  Resp: 18 16    Height:      Weight:  121.836 kg (268 lb 9.6 oz)    SpO2: 97% 94% 93% 95%    Intake/Output Summary (Last 24 hours) at 09/15/15 1544 Last data filed at 09/15/15 0749  Gross per 24 hour  Intake  475.5 ml  Output   2400 ml  Net -1924.5 ml   Filed Weights   09/13/15 1413 09/14/15 0600 09/15/15 0327  Weight: 124.059 kg (273 lb 8 oz) 124.013 kg (273 lb 6.4 oz) 121.836 kg (268 lb 9.6 oz)    Examination:  General exam: Pleasant middle-aged female, moderately built and obese, sitting up comfortably in bed.  Respiratory system: Clear to auscultation. Respiratory effort normal. Cardiovascular system: S1 & S2 heard, RRR. No JVD, murmurs, rubs, gallops or clicks. Trace bilateral pitting edema left >right, Improving . No other acute findings suggestive of cellulitis.  Telemetry: Sinus rhythm.  Gastrointestinal system: Abdomen is nondistended, soft and nontender. No organomegaly or masses felt. Normal bowel sounds heard. Central nervous system: Alert and oriented. No focal neurological deficits. Extremities: Symmetric 5 x 5 power. Skin: No rashes, lesions or  ulcers Psychiatry: Judgement and insight appear normal. Mood & affect appropriate.     Data Reviewed: I have personally reviewed following labs and imaging studies  CBC:  Recent Labs Lab 09/13/15 1005 09/15/15 0313  WBC 6.4 8.5  NEUTROABS 4.4  --   HGB 14.2 13.7  HCT 42.9 41.6  MCV 87.9 89.1  PLT 177 123XX123   Basic Metabolic Panel:  Recent Labs Lab 09/13/15 1005 09/15/15 0313  NA 140 139  K 3.2* 3.5  CL 104 102  CO2 26 29  GLUCOSE 152* 134*  BUN 8 9  CREATININE 0.89 0.95  CALCIUM 9.3 9.2   GFR: Estimated Creatinine Clearance: 81.7 mL/min (by C-G formula based on Cr of 0.95). Liver Function Tests:  Recent Labs Lab 09/13/15 1005 09/15/15 0313  AST 19 15  ALT 19 17  ALKPHOS 76 70  BILITOT 1.7* 1.3*  PROT 7.1 6.8  ALBUMIN 3.7 3.4*   No results for input(s): LIPASE, AMYLASE in the last 168 hours. No results for input(s): AMMONIA in the last 168 hours. Coagulation Profile: No results for input(s): INR, PROTIME in the last 168 hours. Cardiac Enzymes:  Recent Labs Lab 09/13/15 1533 09/13/15 2158 09/14/15 0430  TROPONINI <0.03 <0.03 <0.03   BNP (last 3 results) No results for input(s): PROBNP in the last 8760 hours. HbA1C:  Recent Labs  09/13/15 1005  HGBA1C 7.4*   CBG:  Recent Labs Lab 09/14/15 1141 09/14/15 1656 09/14/15 2106 09/15/15 0735 09/15/15 1121  GLUCAP 154* 127* 159* 122* 172*   Lipid Profile:  Recent Labs  09/14/15 0430  CHOL 165  HDL 38*  LDLCALC 111*  TRIG 82  CHOLHDL 4.3   Thyroid Function Tests: No results for input(s): TSH, T4TOTAL, FREET4, T3FREE, THYROIDAB in the last 72 hours. Anemia Panel: No results for input(s): VITAMINB12, FOLATE, FERRITIN, TIBC, IRON, RETICCTPCT in the last 72 hours.  Sepsis Labs: No results for input(s): PROCALCITON, LATICACIDVEN in the last 168 hours.  Recent Results (from the past 240 hour(s))  Urine culture     Status: Abnormal (Preliminary result)   Collection Time: 09/13/15  12:28 PM  Result Value Ref Range Status   Specimen Description URINE, CLEAN CATCH  Final   Special Requests NONE  Final   Culture >=100,000 COLONIES/mL GRAM NEGATIVE RODS (A)  Final   Report Status PENDING  Incomplete  MRSA PCR Screening     Status: None   Collection Time: 09/13/15  2:45 PM  Result Value Ref Range Status   MRSA by PCR NEGATIVE NEGATIVE Final    Comment:        The GeneXpert MRSA Assay (FDA approved for NASAL specimens only), is one component of a comprehensive MRSA colonization surveillance program. It is not intended to diagnose MRSA infection nor to guide or monitor treatment for MRSA infections.          Radiology Studies: Nm Myocar Multi W/spect W/wall Motion / Ef  09/15/2015  CLINICAL DATA:  Chest pain. EXAM: MYOCARDIAL IMAGING WITH SPECT (REST AND PHARMACOLOGIC-STRESS - 2 DAY PROTOCOL) GATED LEFT VENTRICULAR WALL MOTION STUDY LEFT VENTRICULAR EJECTION FRACTION TECHNIQUE: Standard myocardial SPECT imaging was performed after resting intravenous injection of 30 mCi Tc-58m tetrofosmin. Subsequently, on a second day, intravenous infusion of Lexiscan was performed under the supervision of the Cardiology staff. At peak effect of the drug, 30 mCi Tc-84m tetrofosmin was injected intravenously and standard myocardial SPECT imaging was performed. Quantitative gated imaging was also performed to evaluate left ventricular wall motion, and estimate left ventricular ejection fraction. COMPARISON:  None. FINDINGS: Perfusion: No decreased activity in the left ventricle on stress imaging to suggest reversible ischemia or infarction. Wall Motion: Normal left ventricular wall motion. No left ventricular dilation. Left Ventricular Ejection Fraction: 61 % End diastolic volume [id number] End systolic volume 45 ml IMPRESSION: 1. No reversible ischemia or infarction. 2. Normal left ventricular wall motion. 3. Left ventricular ejection fraction 61% 4. Non invasive risk stratification*: Low risk  *2012 Appropriate Use Criteria for Coronary Revascularization Focused Update: J Am Coll Cardiol. [id number]. http://content.airportbarriers.com.aspx?articleid=[email or web address] Electronically Signed   By: Rolm Baptise M.D.   On: 09/15/2015 13:10   US Abdomen Limited Ruq  09/14/2015  CLINICAL DATA:  Elevated bili Rubin. Hypertension and diabetes. Obesity. EXAM: US ABDOMEN LIMITED - RIGHT UPPER QUADRANT COMPARISON:  None. FINDINGS: Gallbladder: No gallstones or wall thickening visualized. No sonographic Murphy sign noted by sonographer. Common bile duct: Diameter: 8 mm.  No visible stone or obstructing lesion. Liver: No focal lesion identified. Within normal limits in parenchymal echogenicity. IMPRESSION: Normal appearance of the gallbladder. Normal appearance of the liver. Common duct slightly prominent at 8 mm. A ductal stone or pancreatic head mass is not seen, but those  possibilities do exist. Consider MRI MRCP versus ERCP. Electronically Signed   By: Nelson Chimes M.D.   On: 09/14/2015 09:08        Scheduled Meds: . fluticasone  1 spray Each Nare Daily  . furosemide  20 mg Oral Daily  . insulin aspart  0-5 Units Subcutaneous QHS  . insulin aspart  0-9 Units Subcutaneous TID WC  . lisinopril  5 mg Oral Daily  . metoprolol succinate  50 mg Oral Daily  . mometasone-formoterol  2 puff Inhalation BID  . nitroGLYCERIN  0.5 inch Topical Q6H   Continuous Infusions: . heparin 1,500 Units/hr (09/15/15 0700)     LOS: 2 days    Time spent: 20 minutes.    [facility], MD [facility] Pager [phone]  If 7PM-7AM, please contact night-coverage [email or web address] Password [facility] 09/15/2015, 3:44 PM

## 2015-09-15 NOTE — Progress Notes (Signed)
  Echocardiogram 2D Echocardiogram has been performed.  Amanda Butler M 09/15/2015, 9:39 AM

## 2015-09-15 NOTE — Progress Notes (Signed)
Patient Name: Amanda Butler Date of Encounter: 09/15/2015  Principal Problem:   Chest pain Active Problems:   Bilateral lower extremity edema   Diabetes mellitus type 2, diet-controlled (HCC)   Obesity   Total bilirubin, elevated   HTN (hypertension)   Stasis dermatitis of both legs   Left leg cellulitis   RBBB   Asthma   GERD (gastroesophageal reflux disease)   Chest pain syndrome   Pain in the chest   Essential hypertension   Morbid obesity due to excess calories ([place])   Length of Stay: 2  SUBJECTIVE No dyspnea or chest pain. Unable to get CT angio due to suspected contrast reaction. Echo shows normal LVEF and wall motion and normal RV size and function.  CURRENT MEDS . fluticasone  1 spray Each Nare Daily  . furosemide  20 mg Oral Daily  . insulin aspart  0-5 Units Subcutaneous QHS  . insulin aspart  0-9 Units Subcutaneous TID WC  . metoprolol succinate  50 mg Oral Daily  . mometasone-formoterol  2 puff Inhalation BID  . nitroGLYCERIN  0.5 inch Topical Q6H    OBJECTIVE   Intake/Output Summary (Last 24 hours) at 09/15/15 1010 Last data filed at 09/15/15 0749  Gross per 24 hour  Intake  915.5 ml  Output   3000 ml  Net -2084.5 ml   Filed Weights   09/13/15 1413 09/14/15 0600 09/15/15 0327  Weight: 124.059 kg (273 lb 8 oz) 124.013 kg (273 lb 6.4 oz) 121.836 kg (268 lb 9.6 oz)    PHYSICAL EXAM Filed Vitals:   09/14/15 2100 09/14/15 2348 09/15/15 0327 09/15/15 0850  BP: 128/67 154/64 153/70 146/73  Pulse: 66 56 60 62  Temp: 98 F (36.7 C) 98.1 F (36.7 C) 98.2 F (36.8 C) 98.5 F (36.9 C)  TempSrc: Oral Oral Oral Oral  Resp: 20 18 16    Height:      Weight:   121.836 kg (268 lb 9.6 oz)   SpO2: 94% 97% 94% 93%   General: Alert, oriented x3, no distress. Severe obesity limits the exam. Head: no evidence of trauma, PERRL, EOMI, no exophtalmos or lid lag, no myxedema, no xanthelasma; normal ears, nose and oropharynx Neck: normal jugular venous  pulsations and no hepatojugular reflux; brisk carotid pulses without delay and no carotid bruits Chest: clear to auscultation, no signs of consolidation by percussion or palpation, normal fremitus, symmetrical and full respiratory excursions Cardiovascular: normal position and quality of the apical impulse, regular rhythm, normal first and second heart sounds, no rubs or gallops, no murmur Abdomen: no tenderness or distention, no masses by palpation, no abnormal pulsatility or arterial bruits, normal bowel sounds, no hepatosplenomegaly Extremities: no clubbing, cyanosis, mild bilateral brawny edema; 2+ radial, ulnar and brachial pulses bilaterally; 2+ right femoral, posterior tibial and dorsalis pedis pulses; 2+ left femoral, posterior tibial and dorsalis pedis pulses; no subclavian or femoral bruits Neurological: grossly nonfocal  LABS  CBC  Recent Labs  09/13/15 1005 09/15/15 0313  WBC 6.4 8.5  NEUTROABS 4.4  --   HGB 14.2 13.7  HCT 42.9 41.6  MCV 87.9 89.1  PLT 177 [id number]   Basic Metabolic Panel  Recent Labs  09/13/15 1005 09/15/15 0313  NA 140 139  K 3.2* 3.5  CL 104 102  CO2 26 29  GLUCOSE 152* 134*  BUN 8 9  CREATININE 0.89 0.95  CALCIUM 9.3 9.2   Liver Function Tests  Recent Labs  09/13/15 1005 09/15/15 0313  AST 19  15  ALT 19 17  ALKPHOS 76 70  BILITOT 1.7* 1.3*  PROT 7.1 6.8  ALBUMIN 3.7 3.4*   No results for input(s): LIPASE, AMYLASE in the last 72 hours. Cardiac Enzymes  Recent Labs  09/13/15 1533 09/13/15 2158 09/14/15 0430  TROPONINI <0.03 <0.03 <0.03   BNP Invalid input(s): POCBNP D-Dimer  Recent Labs  09/13/15 1005  DDIMER 0.52*   Hemoglobin A1C  Recent Labs  09/13/15 1005  HGBA1C 7.4*   Fasting Lipid Panel  Recent Labs  09/14/15 0430  CHOL 165  HDL 38*  LDLCALC 111*  TRIG 82  CHOLHDL 4.3   Thyroid Function Tests No results for input(s): TSH, T4TOTAL, T3FREE, THYROIDAB in the last 72 hours.  Invalid input(s):  [place]  Radiology Studies Imaging results have been reviewed and Dg Chest 2 View  09/13/2015  CLINICAL DATA:  Chest pain. EXAM: CHEST  2 VIEW COMPARISON:  December 05, 2003. FINDINGS: The heart size and mediastinal contours are within normal limits. Both lungs are clear. No pneumothorax or pleural effusion is noted. Anterior osteophyte formation is noted in mid thoracic spine. IMPRESSION: No active cardiopulmonary disease. Electronically Signed   By: Marijo Conception, M.D.   On: 09/13/2015 10:48   US Abdomen Limited Ruq  09/14/2015  CLINICAL DATA:  Elevated bili Rubin. Hypertension and diabetes. Obesity. EXAM: US ABDOMEN LIMITED - RIGHT UPPER QUADRANT COMPARISON:  None. FINDINGS: Gallbladder: No gallstones or wall thickening visualized. No sonographic Murphy sign noted by sonographer. Common bile duct: Diameter: 8 mm.  No visible stone or obstructing lesion. Liver: No focal lesion identified. Within normal limits in parenchymal echogenicity. IMPRESSION: Normal appearance of the gallbladder. Normal appearance of the liver. Common duct slightly prominent at 8 mm. A ductal stone or pancreatic head mass is not seen, but those possibilities do exist. Consider MRI MRCP versus ERCP. Electronically Signed   By: Nelson Chimes M.D.   On: 09/14/2015 09:08    TELE NSR  ECHO - Left ventricle: The cavity size was normal. Wall thickness was  increased in a pattern of mild LVH. Systolic function was normal.  The estimated ejection fraction was in the range of 55% to 60%.  Wall motion was normal; there were no regional wall motion  abnormalities. Features are consistent with a pseudonormal left  ventricular filling pattern, with concomitant abnormal relaxation  and increased filling pressure (grade 2 diastolic dysfunction).  ASSESSMENT AND PLAN   1. Atypical chest pain: workup in progress. Will evaluate nuclear images. High likelihood for soft tissue attenuation, would recommend coronary angio only if she  has large areas of reversible ischemia, especially in view of possible contrast reaction.   2. Diastolic dysfunction:  On echo, likely due to HTN and obesity. Clinically NYHA class I-II. Edema is more likely due to venous insufficiency.  3.HTN: still has mildly elevated SBP; restart her home lisinopril and low dose diuretic in view of echo findings and edema  4. Poorly controlled DM 2: A1c 7.4.   5. Hyperlipidemia: LDL 111. Change to a more potent statin, target LDL <100, (<70 if CAD is identified)   6. Morbid obesity/Body mass index is 43.37 kg/(m^2): recommend outpatient sleep study.  Sanda Klein, MD, [facility] [phone] office [phone] pager 09/15/2015 10:10 AM

## 2015-09-15 NOTE — Progress Notes (Signed)
VASCULAR LAB PRELIMINARY  PRELIMINARY  PRELIMINARY  PRELIMINARY  Bilateral lower extremity venous duplex completed.    Preliminary report:  There is no DVT or SVT noted in the bilateral lower extremities.   Xue Low, RVT 09/15/2015, 3:21 PM

## 2015-09-15 NOTE — Progress Notes (Signed)
Per MD request Spoke with vascular, patient is on list to have lower ex doppler done just have been un able to get to her at this time

## 2015-09-15 NOTE — Progress Notes (Signed)
ANTICOAGULATION CONSULT NOTE - Follow Up Consult  Pharmacy Consult for heparin Indication: pulmonary embolus   Labs:  Recent Labs  09/13/15 1005 09/13/15 1533 09/13/15 2158 09/14/15 0430 09/15/15 0313  HGB 14.2  --   --   --  13.7  HCT 42.9  --   --   --  41.6  PLT 177  --   --   --  187  HEPARINUNFRC  --   --   --   --  0.55  CREATININE 0.89  --   --   --   --   TROPONINI  --  <0.03 <0.03 <0.03  --      Assessment/Plan:  60yo female therapeutic on heparin with initial dosing for possible PE. Will continue gtt at current rate and confirm stable with additional level.   Wynona Neat, PharmD, BCPS  09/15/2015,4:23 AM

## 2015-09-16 ENCOUNTER — Observation Stay (HOSPITAL_COMMUNITY): Payer: 59

## 2015-09-16 DIAGNOSIS — I10 Essential (primary) hypertension: Secondary | ICD-10-CM | POA: Diagnosis not present

## 2015-09-16 DIAGNOSIS — R600 Localized edema: Secondary | ICD-10-CM | POA: Diagnosis not present

## 2015-09-16 DIAGNOSIS — E119 Type 2 diabetes mellitus without complications: Secondary | ICD-10-CM

## 2015-09-16 DIAGNOSIS — R072 Precordial pain: Secondary | ICD-10-CM

## 2015-09-16 LAB — HEPARIN LEVEL (UNFRACTIONATED): Heparin Unfractionated: 0.34 IU/mL (ref 0.30–0.70)

## 2015-09-16 LAB — CBC
HCT: 42.7 % (ref 36.0–46.0)
Hemoglobin: 14.3 g/dL (ref 12.0–15.0)
MCH: 29.6 pg (ref 26.0–34.0)
MCHC: 33.5 g/dL (ref 30.0–36.0)
MCV: 88.4 fL (ref 78.0–100.0)
Platelets: 201 10*3/uL (ref 150–400)
RBC: 4.83 MIL/uL (ref 3.87–5.11)
RDW: 14.1 % (ref 11.5–15.5)
WBC: 9.4 10*3/uL (ref 4.0–10.5)

## 2015-09-16 LAB — GLUCOSE, CAPILLARY
Glucose-Capillary: 129 mg/dL — ABNORMAL HIGH (ref 65–99)
Glucose-Capillary: 177 mg/dL — ABNORMAL HIGH (ref 65–99)

## 2015-09-16 MED ORDER — TECHNETIUM TC 99M DIETHYLENETRIAME-PENTAACETIC ACID: 31.2000 | Freq: Once | INTRAVENOUS | Status: DC | PRN

## 2015-09-16 MED ORDER — TECHNETIUM TO 99M ALBUMIN AGGREGATED
4.3800 | Freq: Once | INTRAVENOUS | Status: AC | PRN
  Administered 2015-09-16: 4 via INTRAVENOUS

## 2015-09-16 NOTE — Discharge Summary (Signed)
Physician Discharge Summary  Amanda Butler  [id number]  DOB: [birth year]  DOA: 09/13/2015  PCP: Simona Huh, MD  Admit date: 09/13/2015 Discharge date: 09/16/2015  Time spent: Greater than 30 minutes  Recommendations for Outpatient Follow-up:  1. Dr. Gaynelle Arabian, PCP in 1 week. Follow-up regarding management of diabetes. 2. Dr. Sanda Klein, Cardiology: MDs office will arrange outpatient follow-up appointment.  Discharge Diagnoses:  Principal Problem:   Chest pain Active Problems:   Bilateral lower extremity edema   Diabetes mellitus type 2, diet-controlled (HCC)   Obesity   Total bilirubin, elevated   HTN (hypertension)   Stasis dermatitis of both legs   Left leg cellulitis   RBBB   Asthma   GERD (gastroesophageal reflux disease)   Chest pain syndrome   Pain in the chest   Essential hypertension   Morbid obesity due to excess calories [facility])   Discharge Condition: Improved & Stable  Diet recommendation: Heart healthy and diabetic diet.  Filed Weights   09/14/15 0600 09/15/15 0327 09/16/15 0512  Weight: 124.013 kg (273 lb 6.4 oz) 121.836 kg (268 lb 9.6 oz) 120.884 kg (266 lb 8 oz)    History of present illness:  60 year old female patient with a PMH of diet-controlled DM 2, asthma, HTN, morbid obesity, GERD, lives at home with disabled daughter and is her primary caregiver and physically active enough to take care of her, presented to [facility] ED on 09/13/15 with new onset chest pain which awakened her at 8 AM on day of admission. She describes chest pain as mid substernal, severe 10/10 in severity and felt like "an elephant sitting on my chest", associated right shoulder pain, nausea and diaphoresis. Drinking water did not help relieve pain and hence unlike her GERD symptoms. No aspirin given due to history of allergy. Some improvement with sublingual NTG provided by EMS. Chest pain reduced to 6/10 by the time she came to the ED and eventually resolved sometime overnight  without recurrence. Denies abdominal pain. Has chronic dyspnea. Never been tested for sleep apnea. Lifelong nonsmoker. Admitted for chest pain evaluation. Cardiology consulted. Underwent bilateral lower extremity venous Dopplers, 2 days stress test and VQ scan all negative. Chest pain resolved.  Hospital Course:   Chest pain - Has both typical and atypical features for CAD. - CAD risk factors: DM, HTN, HLD, obesity, strong family history (father died at age 81 from MI and mother alive and has a pacemaker)  - troponin cycle 3 and negative. - Chest pain resolved.  - DD: musculoskeletal, GERD, low index of suspicion for VTE even though d-dimer minimally elevated at 0.52 as patient was not hypoxic nor tachycardic on initial evaluation, however was still on the differentials and hence CTA chest requested but canceled due to concern for contrast allergy. Lower extremity venous Dopplers negative. VQ scan: Very low probability of PE. While patient was waiting for VQ scan, she was on IV heparin infusion for a day. Patient had been reluctant this morning to undergo VQ scan due to concern for allergy. Discussed with radiologist on call who recommended that its unlikely for her to have allergy with VQ scan and patient finally agreed to have test done.  Poorly controlled DM 2 - A1c 7.4. CBGs only mildly elevated in the hospital and fluctuating. Recommend diet, weight loss and exercise. Outpatient follow-up with PCP in the next few days to consider further management. May consider discontinuing thiazides and starting metformin. She verbalized understanding.  Essential hypertension - Controlled on home dose of  lisinopril, metoprolol. Thiazides which were held in the hospital will be resumed at discharge which should also help her chronic lower extremity edema but may consider changing to Lasix due to diabetes.  Hyperlipidemia - LDL 111.Outpatient follow-up.   Asthma  - Stable. States that her PCP has  discontinued Symbicort.  Morbid obesity/Body mass index is 43.37 kg/(m^2). - Consider outpatient sleep study.Counseled regarding diet, exercise and weight loss.  RBBB, chronic  GERD - Continue home when necessary Pepcid.  Mildly elevated bilirubin  - No GI symptoms. Total bilirubin 1.7. Other LFTs normal. RUQ ultrasound without acute findings. Improved bilirubin.   Chronic lower extremity edema - Edema felt to be venous insufficiency.Continue thiazides but may consider changing to Lasix during outpatient follow-up.  Hypokalemia - Replaced  Diastolic dysfunction - Seen on echo. However lower extremity edema felt to be due to venous insufficiency. Cardiology plans to follow up as outpatient.  Asymptomatic bacteriuria - Urine culture shows gram-negative rods-final sensitivities pending but in the absence of symptoms, no treatment started.   Consultants:   Cardiology  Procedures:   Bilateral lower extremity venous duplex completed.  Preliminary report: There is no DVT or SVT noted in the bilateral lower extremities.    2-D echo 09/15/15: Study Conclusions  - Left ventricle: The cavity size was normal. Wall thickness was  increased in a pattern of mild LVH. Systolic function was normal.  The estimated ejection fraction was in the range of 55% to 60%.  Wall motion was normal; there were no regional wall motion  abnormalities. Features are consistent with a pseudonormal left  ventricular filling pattern, with concomitant abnormal relaxation  and increased filling pressure (grade 2 diastolic dysfunction). - Mitral valve: There was mild regurgitation directed centrally. - Left atrium: The atrium was mildly dilated.    Discharge Exam:  Complaints: No recurrence of chest pain or dyspnea since admission. Denies any other complaints.  Filed Vitals:   09/15/15 2124 09/16/15 0512 09/16/15 0826 09/16/15 1438  BP: 131/77 132/53  116/62  Pulse: 56 62    Temp: 97.7  F (36.5 C) 97.7 F (36.5 C)  98.2 F (36.8 C)  TempSrc: Oral Oral  Oral  Resp:      Height:      Weight:  120.884 kg (266 lb 8 oz)    SpO2: 98% 97% 99% 98%    General exam: Pleasant middle-aged female, moderately built and obese, sitting up comfortably in bed.  Respiratory system: Clear to auscultation. Respiratory effort normal. Cardiovascular system: S1 & S2 heard, RRR. No JVD, murmurs, rubs, gallops or clicks. Trace bilateral leg edema-improved.Telemetry: Sinus rhythm.  Gastrointestinal system: Abdomen is nondistended, soft and nontender. No organomegaly or masses felt. Normal bowel sounds heard. Central nervous system: Alert and oriented. No focal neurological deficits. Extremities: Symmetric 5 x 5 power. Skin: No rashes, lesions or ulcers Psychiatry: Judgement and insight appear normal. Mood & affect appropriate.   Discharge Instructions      Discharge Instructions    Call MD for:  difficulty breathing, headache or visual disturbances    Complete by:  As directed      Call MD for:  extreme fatigue    Complete by:  As directed      Call MD for:  persistant dizziness or light-headedness    Complete by:  As directed      Call MD for:  severe uncontrolled pain    Complete by:  As directed      Diet - low sodium heart  healthy    Complete by:  As directed      Diet Carb Modified    Complete by:  As directed      Increase activity slowly    Complete by:  As directed             Medication List    TAKE these medications        albuterol 108 (90 Base) MCG/ACT inhaler  Commonly known as:  PROVENTIL HFA;VENTOLIN HFA  Inhale 2 puffs into the lungs 2 (two) times daily as needed.     famotidine 10 MG tablet  Commonly known as:  PEPCID  Take 10 mg by mouth daily as needed.     lisinopril 5 MG tablet  Commonly known as:  PRINIVIL,ZESTRIL  Take 5 mg by mouth daily.     metoprolol succinate 50 MG 24 hr tablet  Commonly known as:  TOPROL-XL  Take 50 mg by mouth  daily. Take with or immediately following a meal.     mometasone 50 MCG/ACT nasal spray  Commonly known as:  NASONEX  Place 2 sprays into the nose daily. Daily as needed     pravastatin 20 MG tablet  Commonly known as:  PRAVACHOL  Take 20 mg by mouth daily.     triamterene-hydrochlorothiazide 75-50 MG tablet  Commonly known as:  MAXZIDE  Take 1 tablet by mouth daily.       Follow-up Information    Follow up with Simona Huh, MD. Schedule an appointment as soon as possible for a visit in 1 week.   Specialty:  Family Medicine   Why:  Follow-up regarding management of diabetes.   Contact information:   [address] [place] [address] [phone]       Follow up with Sanda Klein, MD.   Specialty:  Cardiology   Why:  MD's office will call with appointment.   Contact information:   [address] [place] [address] [phone]       Get Medicines reviewed and adjusted: Please take all your medications with you for your next visit with your Primary MD  Please request your Primary MD to go over all hospital tests and procedure/radiological results at the follow up. Please ask your Primary MD to get all Hospital records sent to his/her office.  If you experience worsening of your admission symptoms, develop shortness of breath, life threatening emergency, suicidal or homicidal thoughts you must seek medical attention immediately by calling 911 or calling your MD immediately if symptoms less severe.  You must read complete instructions/literature along with all the possible adverse reactions/side effects for all the Medicines you take and that have been prescribed to you. Take any new Medicines after you have completely understood and accept all the possible adverse reactions/side effects.   Do not drive when taking pain medications.   Do not take more than prescribed Pain, Sleep and Anxiety Medications  Special Instructions: If  you have smoked or chewed Tobacco in the last 2 yrs please stop smoking, stop any regular Alcohol and or any Recreational drug use.  Wear Seat belts while driving.  Please note  You were cared for by a hospitalist during your hospital stay. Once you are discharged, your primary care physician will handle any further medical issues. Please note that NO REFILLS for any discharge medications will be authorized once you are discharged, as it is imperative that you return to your primary care physician (or establish a  relationship with a primary care physician if you do not have one) for your aftercare needs so that they can reassess your need for medications and monitor your lab values.    The results of significant diagnostics from this hospitalization (including imaging, microbiology, ancillary and laboratory) are listed below for reference.    Significant Diagnostic Studies: Dg Chest 2 View  09/13/2015  CLINICAL DATA:  Chest pain. EXAM: CHEST  2 VIEW COMPARISON:  December 05, 2003. FINDINGS: The heart size and mediastinal contours are within normal limits. Both lungs are clear. No pneumothorax or pleural effusion is noted. Anterior osteophyte formation is noted in mid thoracic spine. IMPRESSION: No active cardiopulmonary disease. Electronically Signed   By: Marijo Conception, M.D.   On: 09/13/2015 10:48   Nm Myocar Multi W/spect W/wall Motion / Ef  09/15/2015  CLINICAL DATA:  Chest pain. EXAM: MYOCARDIAL IMAGING WITH SPECT (REST AND PHARMACOLOGIC-STRESS - 2 DAY PROTOCOL) GATED LEFT VENTRICULAR WALL MOTION STUDY LEFT VENTRICULAR EJECTION FRACTION TECHNIQUE: Standard myocardial SPECT imaging was performed after resting intravenous injection of 30 mCi Tc-41m tetrofosmin. Subsequently, on a second day, intravenous infusion of Lexiscan was performed under the supervision of the Cardiology staff. At peak effect of the drug, 30 mCi Tc-3m tetrofosmin was injected intravenously and standard myocardial SPECT  imaging was performed. Quantitative gated imaging was also performed to evaluate left ventricular wall motion, and estimate left ventricular ejection fraction. COMPARISON:  None. FINDINGS: Perfusion: No decreased activity in the left ventricle on stress imaging to suggest reversible ischemia or infarction. Wall Motion: Normal left ventricular wall motion. No left ventricular dilation. Left Ventricular Ejection Fraction: 61 % End diastolic volume [id number] ml End systolic volume 45 ml IMPRESSION: 1. No reversible ischemia or infarction. 2. Normal left ventricular wall motion. 3. Left ventricular ejection fraction 61% 4. Non invasive risk stratification*: Low risk *2012 Appropriate Use Criteria for Coronary Revascularization Focused Update: J Am Coll Cardiol. [id number]. http://content.airportbarriers.com.aspx?articleid=[email or web address] Electronically Signed   By: Rolm Baptise M.D.   On: 09/15/2015 13:10   Nm Pulmonary Perf And Vent  09/16/2015  CLINICAL DATA:  Chest pain EXAM: NUCLEAR MEDICINE VENTILATION - PERFUSION LUNG SCAN Views: Anterior, posterior, left lateral, right lateral, RPO, LPO, RAO, LAO -ventilation and perfusion RADIOPHARMACEUTICALS:  31.2 mCi Technetium-15m DTPA aerosol inhalation and 4.38 mCi Technetium-73m MAA IV COMPARISON:  Chest radiograph September 13, 2015 FINDINGS: Ventilation: Radiotracer uptake is homogeneous and symmetric bilaterally. There is no appreciable ventilation defect. Perfusion: Radiotracer uptake is homogeneous and symmetric bilaterally. There is no appreciable perfusion defect. IMPRESSION: No appreciable ventilation or perfusion defects. This study constitutes a very low probability of pulmonary embolus. Electronically Signed   By: Lowella Grip III M.D.   On: 09/16/2015 14:29   US Abdomen Limited Ruq  09/14/2015  CLINICAL DATA:  Elevated bili Rubin. Hypertension and diabetes. Obesity. EXAM: US ABDOMEN LIMITED - RIGHT UPPER QUADRANT COMPARISON:  None. FINDINGS: Gallbladder: No  gallstones or wall thickening visualized. No sonographic Murphy sign noted by sonographer. Common bile duct: Diameter: 8 mm.  No visible stone or obstructing lesion. Liver: No focal lesion identified. Within normal limits in parenchymal echogenicity. IMPRESSION: Normal appearance of the gallbladder. Normal appearance of the liver. Common duct slightly prominent at 8 mm. A ductal stone or pancreatic head mass is not seen, but those possibilities do exist. Consider MRI MRCP versus ERCP. Electronically Signed   By: Nelson Chimes M.D.   On: 09/14/2015 09:08    Microbiology: Recent Results (from the past 240 hour(s))  Urine culture     Status: Abnormal (Preliminary result)   Collection Time: 09/13/15 12:28 PM  Result Value Ref Range Status   Specimen Description URINE, CLEAN CATCH  Final   Special Requests NONE  Final   Culture (A)  Final    >=100,000 COLONIES/mL GRAM NEGATIVE RODS CULTURE REINCUBATED FOR BETTER GROWTH    Report Status PENDING  Incomplete  MRSA PCR Screening     Status: None   Collection Time: 09/13/15  2:45 PM  Result Value Ref Range Status   MRSA by PCR NEGATIVE NEGATIVE Final    Comment:        The GeneXpert MRSA Assay (FDA approved for NASAL specimens only), is one component of a comprehensive MRSA colonization surveillance program. It is not intended to diagnose MRSA infection nor to guide or monitor treatment for MRSA infections.      Labs: Basic Metabolic Panel:  Recent Labs Lab 09/13/15 1005 09/15/15 0313  NA 140 139  K 3.2* 3.5  CL 104 102  CO2 26 29  GLUCOSE 152* 134*  BUN 8 9  CREATININE 0.89 0.95  CALCIUM 9.3 9.2   Liver Function Tests:  Recent Labs Lab 09/13/15 1005 09/15/15 0313  AST 19 15  ALT 19 17  ALKPHOS 76 70  BILITOT 1.7* 1.3*  PROT 7.1 6.8  ALBUMIN 3.7 3.4*   No results for input(s): LIPASE, AMYLASE in the last 168 hours. No results for input(s): AMMONIA in the last 168 hours. CBC:  Recent Labs Lab 09/13/15 1005  09/15/15 0313 09/16/15 0348  WBC 6.4 8.5 9.4  NEUTROABS 4.4  --   --   HGB 14.2 13.7 14.3  HCT 42.9 41.6 42.7  MCV 87.9 89.1 88.4  PLT 177 187 201   Cardiac Enzymes:  Recent Labs Lab 09/13/15 1533 09/13/15 2158 09/14/15 0430  TROPONINI <0.03 <0.03 <0.03   BNP: BNP (last 3 results) No results for input(s): BNP in the last 8760 hours.  ProBNP (last 3 results) No results for input(s): PROBNP in the last 8760 hours.  CBG:  Recent Labs Lab 09/15/15 1121 09/15/15 1620 09/15/15 2126 09/16/15 0738 09/16/15 1141  GLUCAP 172* 155* 162* 129* 177*       Signed:  Vernell Leep, MD, FACP, FHM. [facility] Pager [phone]  If 7PM-7AM, please contact night-coverage [email or web address] Password [email or web address] 09/16/2015, 3:09 PM

## 2015-09-16 NOTE — Progress Notes (Signed)
Myocardial perfusion study is normal. For V/Q today. Will arrange for outpatient Cardiology follow up for diastolic dysfunction at discharge.  Sanda Klein, MD, [facility] [phone] office [phone] pager

## 2015-09-16 NOTE — Discharge Instructions (Signed)
Nonspecific Chest Pain  °Chest pain can be caused by many different conditions. There is always a chance that your pain could be related to something serious, such as a heart attack or a blood clot in your lungs. Chest pain can also be caused by conditions that are not life-threatening. If you have chest pain, it is very important to follow up with your health care provider. °CAUSES  °Chest pain can be caused by: °· Heartburn. °· Pneumonia or bronchitis. °· Anxiety or stress. °· Inflammation around your heart (pericarditis) or lung (pleuritis or pleurisy). °· A blood clot in your lung. °· A collapsed lung (pneumothorax). It can develop suddenly on its own (spontaneous pneumothorax) or from trauma to the chest. °· Shingles infection (varicella-zoster virus). °· Heart attack. °· Damage to the bones, muscles, and cartilage that make up your chest wall. This can include: °¨ Bruised bones due to injury. °¨ Strained muscles or cartilage due to frequent or repeated coughing or overwork. °¨ Fracture to one or more ribs. °¨ Sore cartilage due to inflammation (costochondritis). °RISK FACTORS  °Risk factors for chest pain may include: °· Activities that increase your risk for trauma or injury to your chest. °· Respiratory infections or conditions that cause frequent coughing. °· Medical conditions or overeating that can cause heartburn. °· Heart disease or family history of heart disease. °· Conditions or health behaviors that increase your risk of developing a blood clot. °· Having had chicken pox (varicella zoster). °SIGNS AND SYMPTOMS °Chest pain can feel like: °· Burning or tingling on the surface of your chest or deep in your chest. °· Crushing, pressure, aching, or squeezing pain. °· Dull or sharp pain that is worse when you move, cough, or take a deep breath. °· Pain that is also felt in your back, neck, shoulder, or arm, or pain that spreads to any of these areas. °Your chest pain may come and go, or it may stay  constant. °DIAGNOSIS °Lab tests or other studies may be needed to find the cause of your pain. Your health care provider may have you take a test called an ambulatory ECG (electrocardiogram). An ECG records your heartbeat patterns at the time the test is performed. You may also have other tests, such as: °· Transthoracic echocardiogram (TTE). During echocardiography, sound waves are used to create a picture of all of the heart structures and to look at how blood flows through your heart. °· Transesophageal echocardiogram (TEE). This is a more advanced imaging test that obtains images from inside your body. It allows your health care provider to see your heart in finer detail. °· Cardiac monitoring. This allows your health care provider to monitor your heart rate and rhythm in real time. °· Holter monitor. This is a portable device that records your heartbeat and can help to diagnose abnormal heartbeats. It allows your health care provider to track your heart activity for several days, if needed. °· Stress tests. These can be done through exercise or by taking medicine that makes your heart beat more quickly. °· Blood tests. °· Imaging tests. °TREATMENT  °Your treatment depends on what is causing your chest pain. Treatment may include: °· Medicines. These may include: °¨ Acid blockers for heartburn. °¨ Anti-inflammatory medicine. °¨ Pain medicine for inflammatory conditions. °¨ Antibiotic medicine, if an infection is present. °¨ Medicines to dissolve blood clots. °¨ Medicines to treat coronary artery disease. °· Supportive care for conditions that do not require medicines. This may include: °¨ Resting. °¨ Applying heat   or cold packs to injured areas. °¨ Limiting activities until pain decreases. °HOME CARE INSTRUCTIONS °· If you were prescribed an antibiotic medicine, finish it all even if you start to feel better. °· Avoid any activities that bring on chest pain. °· Do not use any tobacco products, including  cigarettes, chewing tobacco, or electronic cigarettes. If you need help quitting, ask your health care provider. °· Do not drink alcohol. °· Take medicines only as directed by your health care provider. °· Keep all follow-up visits as directed by your health care provider. This is important. This includes any further testing if your chest pain does not go away. °· If heartburn is the cause for your chest pain, you may be told to keep your head raised (elevated) while sleeping. This reduces the chance that acid will go from your stomach into your esophagus. °· Make lifestyle changes as directed by your health care provider. These may include: °¨ Getting regular exercise. Ask your health care provider to suggest some activities that are safe for you. °¨ Eating a heart-healthy diet. A registered dietitian can help you to learn healthy eating options. °¨ Maintaining a healthy weight. °¨ Managing diabetes, if necessary. °¨ Reducing stress. °SEEK MEDICAL CARE IF: °· Your chest pain does not go away after treatment. °· You have a rash with blisters on your chest. °· You have a fever. °SEEK IMMEDIATE MEDICAL CARE IF:  °· Your chest pain is worse. °· You have an increasing cough, or you cough up blood. °· You have severe abdominal pain. °· You have severe weakness. °· You faint. °· You have chills. °· You have sudden, unexplained chest discomfort. °· You have sudden, unexplained discomfort in your arms, back, neck, or jaw. °· You have shortness of breath at any time. °· You suddenly start to sweat, or your skin gets clammy. °· You feel nauseous or you vomit. °· You suddenly feel light-headed or dizzy. °· Your heart begins to beat quickly, or it feels like it is skipping beats. °These symptoms may represent a serious problem that is an emergency. Do not wait to see if the symptoms will go away. Get medical help right away. Call your local emergency services (911 in the [place] Do not drive yourself to the hospital. °  °This  information is not intended to replace advice given to you by your health care provider. Make sure you discuss any questions you have with your health care provider. °  °Document Released: 01/08/2005 Document Revised: 04/21/2014 Document Reviewed: 11/04/2013 °Elsevier Interactive Patient Education ©2016 [employer] ° °

## 2015-09-16 NOTE — Progress Notes (Signed)
ANTICOAGULATION CONSULT NOTE - Follow Up Consult  Pharmacy Consult for heparin Indication: pulmonary embolus  Allergies  Allergen Reactions  . Bee Venom Anaphylaxis  . Shellfish Allergy Anaphylaxis  . Aspirin Hives  . Iodine     Doesn't remember   . Latex Hives  . Morphine And Related     Doesn't remember   . Nsaids Hives    Patient Measurements: Height: 5\' 6"  (167.6 cm) Weight: 266 lb 8 oz (120.884 kg) IBW/kg (Calculated) : 59.3 Heparin Dosing Weight: 89kg  Vital Signs: Temp: 97.7 F (36.5 C) (06/04 0512) Temp Source: Oral (06/04 0512) BP: 132/53 mmHg (06/04 0512) Pulse Rate: 62 (06/04 0512)  Labs:  Recent Labs  09/13/15 1005 09/13/15 1533 09/13/15 2158 09/14/15 0430 09/15/15 0313 09/15/15 0943 09/16/15 0348  HGB 14.2  --   --   --  13.7  --  14.3  HCT 42.9  --   --   --  41.6  --  42.7  PLT 177  --   --   --  187  --  201  HEPARINUNFRC  --   --   --   --  0.55 0.38 0.34  CREATININE 0.89  --   --   --  0.95  --   --   TROPONINI  --  <0.03 <0.03 <0.03  --   --   --     Estimated Creatinine Clearance: 81.3 mL/min (by C-G formula based on Cr of 0.95).    Assessment: 89 YOF with elevated d-dimer and chest pain. Unable to do CTA d/t allergy, waiting until morning to perform VQ scan to r/o PE> started heparin. She did receive a dose of Lovenox 40mg  subQ at 1500 (below recommended prophylactic dose d/t her weight). She is not on anticoagulation PTA. Baseline Hgb 14.2, plts 177.  Heparin drip 1500 uts/hr HL 0.34 remains at goal.  Doppler neg DVT, myoview neg ischemia and EF 60%  Goal of Therapy:  Heparin level 0.3-0.7 units/ml Monitor platelets by anticoagulation protocol: Yes   Plan:  -Continue heparin infusion at 1500 units/hr -daily HL and CBC F/u plan  Bonnita Nasuti Pharm.D. CPP, BCPS Clinical Pharmacist [phone] 09/16/2015 9:25 AM

## 2015-09-18 LAB — URINE CULTURE: Culture: >=100000 — AB

## 2015-10-04 ENCOUNTER — Other Ambulatory Visit: Payer: Self-pay | Admitting: *Deleted

## 2015-10-04 ENCOUNTER — Encounter: Payer: Self-pay | Admitting: Nurse Practitioner

## 2015-10-04 ENCOUNTER — Ambulatory Visit (INDEPENDENT_AMBULATORY_CARE_PROVIDER_SITE_OTHER): Payer: 59 | Admitting: Nurse Practitioner

## 2015-10-04 VITALS — BP 150/90 | HR 82 | Ht 66.0 in | Wt 271.2 lb

## 2015-10-04 DIAGNOSIS — R072 Precordial pain: Secondary | ICD-10-CM | POA: Diagnosis not present

## 2015-10-04 DIAGNOSIS — R0789 Other chest pain: Secondary | ICD-10-CM

## 2015-10-04 DIAGNOSIS — E7800 Pure hypercholesterolemia, unspecified: Secondary | ICD-10-CM | POA: Insufficient documentation

## 2015-10-04 DIAGNOSIS — I110 Hypertensive heart disease with heart failure: Secondary | ICD-10-CM | POA: Diagnosis not present

## 2015-10-04 DIAGNOSIS — R0683 Snoring: Secondary | ICD-10-CM

## 2015-10-04 DIAGNOSIS — E119 Type 2 diabetes mellitus without complications: Secondary | ICD-10-CM | POA: Insufficient documentation

## 2015-10-04 DIAGNOSIS — E785 Hyperlipidemia, unspecified: Secondary | ICD-10-CM | POA: Insufficient documentation

## 2015-10-04 DIAGNOSIS — I5032 Chronic diastolic (congestive) heart failure: Secondary | ICD-10-CM | POA: Diagnosis not present

## 2015-10-04 MED ORDER — LISINOPRIL 10 MG PO TABS: 10.0000 mg | ORAL_TABLET | Freq: Every day | ORAL | Status: DC

## 2015-10-04 NOTE — Progress Notes (Signed)
Office Visit    Patient Name: Amanda Butler Date of Encounter: 10/04/2015  Primary Care Provider:  Simona Huh, MD Primary Cardiologist:  Jerilynn Mages. Croitoru, MD   Chief Complaint     60 year old female with a history of hypertension, hyperlipidemia, obesity, and diabetes, who was recently admitted with chest pain and had negative workup.  Past Medical History    Past Medical History  Diagnosis Date  . Asthma   . Hypertension   . Obesity   . High cholesterol   . Pneumonia ~ 2000    "mild case"  . Type II diabetes mellitus ([place])   . GERD (gastroesophageal reflux disease)   . Arthritis     "knees" (09/13/2015)  . RBBB (right bundle branch block) approx 2010    Dr Marisue Humble did ECG and told her  . Morbid obesity ([place])   . Midsternal chest pain     a. 09/2015 MV: no ischemia/infarct, EF 61%; b. 09/2015 Low prob V:Q scan.  . Chronic diastolic CHF (congestive heart failure) ([place])     a. 09/2015 Echo: EF 55-60%, mild LVH, gr2DD, no rwma, mild MR, mildly dil LA.    Past Surgical History  Procedure Laterality Date  . Tendon release Right     Leg  . Bunionectomy Left   . Multiple tooth extractions  ~ 1975  . Tubal ligation  ~ 1983    Allergies  Allergies  Allergen Reactions  . Bee Venom Anaphylaxis  . Shellfish Allergy Anaphylaxis  . Aspirin Hives  . Iodine     Doesn't remember   . Latex Hives  . Morphine And Related     Doesn't remember   . Nsaids Hives    History of Present Illness    60 year old female without prior cardiac history, though she has multiple risk factors including hypertension, hyperlipidemia, obesity, diabetes, and family history. She was recently admitted with chest pain and ruled out. VQ scan was low probability for PE. Stress testing was undertaken and was negative for ischemia or infarct. Echo showed normal LV function with grade 2 diastolic dysfunction. She was subsequently discharged. Since discharge, she has done reasonably well. She denies any  recurrent chest pain or dyspnea on exertion. She has some degree of chronic lower extremity edema, which is generally dependent in nature and resolves with keeping her feet elevated. This has been stable. She does not weigh herself at home. I noted that during her hospitalization there was question as to whether or not she would require outpatient sleep evaluation. She says that she does snore but doesn't think that she would arrange for sleep evaluation as she is responsible for 39 year old daughter who has multiple sclerosis and requires around-the-clock care.  Home Medications    Prior to Admission medications   Medication Sig Start Date End Date Taking? Authorizing Provider  albuterol (PROVENTIL HFA;VENTOLIN HFA) 108 (90 BASE) MCG/ACT inhaler Inhale 2 puffs into the lungs 2 (two) times daily as needed.   Yes Historical Provider, MD  famotidine (PEPCID) 10 MG tablet Take 10 mg by mouth daily as needed.   Yes Historical Provider, MD  lisinopril (PRINIVIL,ZESTRIL) 10 MG tablet Take 1 tablet (10 mg total) by mouth daily. 10/04/15  Yes Rogelia Mire, NP  metoprolol succinate (TOPROL-XL) 50 MG 24 hr tablet Take 50 mg by mouth daily. Take with or immediately following a meal.   Yes Historical Provider, MD  mometasone (NASONEX) 50 MCG/ACT nasal spray Place 2 sprays into the nose daily. Daily  as needed   Yes Historical Provider, MD  pravastatin (PRAVACHOL) 20 MG tablet Take 20 mg by mouth daily.   Yes Historical Provider, MD  triamterene-hydrochlorothiazide (MAXZIDE) 75-50 MG per tablet Take 1 tablet by mouth daily.   Yes Historical Provider, MD  metFORMIN (GLUCOPHAGE-XR) 500 MG 24 hr tablet Take 1,000 mg by mouth daily. Reported on 10/04/2015 10/02/15   Historical Provider, MD    Review of Systems    She has some degree of chronic mild lower extremity swelling. She denies chest pain, palpitations, PND, orthopnea, dizziness, syncope, or early satiety.  All other systems reviewed and are otherwise  negative except as noted above.  Physical Exam    VS:  BP 150/90 mmHg  Pulse 82  Ht 5\' 6"  (1.676 m)  Wt 271 lb 3.2 oz (123.016 kg)  BMI 43.79 kg/m2 , BMI Body mass index is 43.79 kg/(m^2). GEN: Well nourished, well developed, in no acute distress. HEENT: normal. Neck: Obese, Supple, no JVD, carotid bruits, or masses. Cardiac: RRR, no murmurs, rubs, or gallops. No clubbing, cyanosis, edema.  Radials/DP/PT 2+ and equal bilaterally.  Respiratory:  Respirations regular and unlabored, clear to auscultation bilaterally. GI: Soft, nontender, nondistended, BS + x 4. MS: no deformity or atrophy. Skin: warm and dry, no rash. Neuro:  Strength and sensation are intact. Psych: Normal affect.  Accessory Clinical Findings    None  Assessment & Plan    1.  Midsternal chest pain: Patient was recently admitted with chest discomfort and ruled out for MI. VQ scan was low probability for PE. Stress testing showed normal LV function without evidence of ischemia or infarct. Echo also showed normal LV function with grade 2 diastolic dysfunction. She has had no recurrence of chest pain. This does not require further evaluation at this time.  2. Chronic diastolic CHF: Patient is some degree of chronic mild lower extremity edema. She does not weigh herself at home.  We discussed the importance of daily weights, sodium restriction, medication compliance, and symptom reporting and she verbalizes understanding. Her blood pressure is elevated today and I will increase her lisinopril to 10 mg daily. She remains on Toprol-XL.  3. Hypertensive heart disease: Blood pressure elevated today. She does not check this at home. I will increase her to lisinopril 10 mg daily. She continues on Toprol-XL 50 mg daily as well as triamterene HCTZ.  4. Morbid obesity: Patient's activity is limited home as she has a 57 year old daughter with multiple sclerosis who requires her attention around-the-clock. We did discuss calorie  reduction.  5.  Type 2 diabetes mellitus: This is listed as being diet controlled. Recent A1c was 7.4. She will follow up with primary care for further instruction.  6. Snoring: Patient has a history of snoring. Because she must take care of her daughter, she does not think that a sleep study will fit into her schedule. She will let us know if this changes.  7. Disposition: Follow-up with Dr. Sallyanne Kuster in 3 months or sooner if necessary.    Murray Hodgkins, NP 10/04/2015, 1:32 PM

## 2015-10-04 NOTE — Patient Instructions (Signed)
Medication Instructions:   INCREASE LISINOPRIL TO 10 MG ONCE DAILY= 2 OF THE 5 MG TABLETS ONCE DAILY  Follow-Up:  Your physician recommends that you schedule a follow-up appointment in: [place]

## 2015-12-19 ENCOUNTER — Encounter: Payer: Self-pay | Admitting: Cardiovascular Disease

## 2015-12-19 ENCOUNTER — Ambulatory Visit (INDEPENDENT_AMBULATORY_CARE_PROVIDER_SITE_OTHER): Payer: 59 | Admitting: Cardiovascular Disease

## 2015-12-19 VITALS — BP 122/60 | HR 80 | Ht 66.0 in | Wt 262.4 lb

## 2015-12-19 DIAGNOSIS — E119 Type 2 diabetes mellitus without complications: Secondary | ICD-10-CM

## 2015-12-19 DIAGNOSIS — E7800 Pure hypercholesterolemia, unspecified: Secondary | ICD-10-CM

## 2015-12-19 DIAGNOSIS — I5032 Chronic diastolic (congestive) heart failure: Secondary | ICD-10-CM

## 2015-12-19 DIAGNOSIS — I10 Essential (primary) hypertension: Secondary | ICD-10-CM | POA: Diagnosis not present

## 2015-12-19 NOTE — Patient Instructions (Signed)
Dr Croitoru recommends that you schedule a follow-up appointment in 1 year. You will receive a reminder letter in the mail two months in advance. If you don't receive a letter, please call our office to schedule the follow-up appointment.  If you need a refill on your cardiac medications before your next appointment, please call your pharmacy. 

## 2015-12-19 NOTE — Progress Notes (Signed)
Cardiology Office Note    Date:  12/19/2015   ID:  Amanda Butler, DOB July 14, 1955, MRN [id number]  PCP:  Simona Huh, MD  Cardiologist:   Sanda Klein, MD   Chief Complaint  Patient presents with  . Follow-up    patient reports no complaints    History of Present Illness:  Amanda Butler is a 60 y.o. female with chronic diastolic heart failure, morbid obesity, hypertension, hyperlipidemia and diabetes mellitus with a recent inpatient workup for atypical chest pain that did not show evidence of ischemic heart disease. She has normal left ventricular systolic function, but did have left ventricular hypertrophy and evidence of elevated filling pressures by echo.  He has lost a little bit of weight since her last appointment. Her home scale shows 258 pounds, about 4 pounds less than our office scale. Blood pressure control is excellent. She reports that fasting glycemic control has improved markedly. She was briefly treated with metformin but stopped since this was causing extreme weakness. When she was hospitalized in June the hemoglobin A1c was 7.4%.  She is taking 4 different agents for blood pressure control and is tolerating the statin for hyperlipidemia. She denies syncope, dizziness, palpitations, angina or any change in her chronic dyspnea. Her edema is much better.  Past Medical History:  Diagnosis Date  . Arthritis    "knees" (09/13/2015)  . Asthma   . Chronic diastolic CHF (congestive heart failure) ([place])    a. 09/2015 Echo: EF 55-60%, mild LVH, gr2DD, no rwma, mild MR, mildly dil LA.   Marland Kitchen GERD (gastroesophageal reflux disease)   . High cholesterol   . Hypertension   . Midsternal chest pain    a. 09/2015 MV: no ischemia/infarct, EF 61%; b. 09/2015 Low prob V:Q scan.  . Morbid obesity ([place])   . Obesity   . Pneumonia ~ 2000   "mild case"  . RBBB (right bundle branch block) approx 2010   Dr Marisue Humble did ECG and told her  . Type II diabetes mellitus ([place])     Past  Surgical History:  Procedure Laterality Date  . BUNIONECTOMY Left   . MULTIPLE TOOTH EXTRACTIONS  ~ 1975  . TENDON RELEASE Right    Leg  . TUBAL LIGATION  ~ 1983    Current Medications: Outpatient Medications Prior to Visit  Medication Sig Dispense Refill  . albuterol (PROVENTIL HFA;VENTOLIN HFA) 108 (90 BASE) MCG/ACT inhaler Inhale 2 puffs into the lungs 2 (two) times daily as needed.    . famotidine (PEPCID) 10 MG tablet Take 10 mg by mouth daily as needed.    Marland Kitchen lisinopril (PRINIVIL,ZESTRIL) 10 MG tablet Take 1 tablet (10 mg total) by mouth daily. 90 tablet 3  . metoprolol succinate (TOPROL-XL) 50 MG 24 hr tablet Take 50 mg by mouth daily. Take with or immediately following a meal.    . mometasone (NASONEX) 50 MCG/ACT nasal spray Place 2 sprays into the nose daily. Daily as needed    . pravastatin (PRAVACHOL) 20 MG tablet Take 20 mg by mouth daily.    Marland Kitchen triamterene-hydrochlorothiazide (MAXZIDE) 75-50 MG per tablet Take 1 tablet by mouth daily.    . metFORMIN (GLUCOPHAGE-XR) 500 MG 24 hr tablet Take 1,000 mg by mouth daily. Reported on 10/04/2015     No facility-administered medications prior to visit.      Allergies:   Bee venom; Shellfish allergy; Aspirin; Iodine; Latex; Metformin and related; Morphine and related; and Nsaids   Social History   Social  History  . Marital status: Widowed    Spouse name: N/A  . Number of children: N/A  . Years of education: N/A   Occupational History  . 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 Smoking status: Never Smoker  . Smokeless tobacco: Never Used  . Alcohol use No  . Drug use: No  . Sexual activity: No   Other Topics Concern  . Not on file   Social History Narrative   Pt lives with disabled daughter, is her primary caregiver.     Family History:  The patient's family history includes Asthma in her paternal grandmother; Cancer in her maternal grandfather; Diabetes in her father, maternal aunt,  maternal grandfather, maternal grandmother, mother, paternal grandfather, and paternal grandmother; Heart attack (age of onset: 65) in her father; Heart failure in her paternal grandfather; Hypertension in her father, maternal grandfather, maternal grandmother, mother, paternal grandfather, and paternal grandmother.  Mother had a pacemaker at age 12 ROS:   Please see the history of present illness.    ROS All other systems reviewed and are negative.   PHYSICAL EXAM:   VS:  BP 122/60   Pulse 80   Ht 5\' 6"  (1.676 m)   Wt 262 lb 6.4 oz (119 kg)   BMI 42.35 kg/m    GEN: Morbid obesity which limits her exam, well developed, in no acute distress  HEENT: normal  Neck: no JVD, carotid bruits, or masses Cardiac: widely split second heart sound RRR; no murmurs, rubs, or gallops,no edema  Respiratory:  clear to auscultation bilaterally, normal work of breathing GI: soft, nontender, nondistended, + BS MS: no deformity or atrophy  Skin: warm and dry, no rash Neuro:  Alert and Oriented x 3, Strength and sensation are intact Psych: euthymic mood, full affect  Wt Readings from Last 3 Encounters:  12/19/15 262 lb 6.4 oz (119 kg)  10/04/15 271 lb 3.2 oz (123 kg)  09/16/15 266 lb 8 oz (120.9 kg)      Studies/Labs Reviewed:   EKG:  EKG is not ordered today.  The ekg ordered today demonstrates June 1 shows normal sinus rhythm and right bundle branch block  Recent Labs: 09/15/2015: ALT 17; BUN 9; Creatinine, Ser 0.95; Potassium 3.5; Sodium 139 09/16/2015: Hemoglobin 14.3; Platelets 201   Lipid Panel    Component Value Date/Time   CHOL 165 09/14/2015 0430   TRIG 82 09/14/2015 0430   HDL 38 (L) 09/14/2015 0430   CHOLHDL 4.3 09/14/2015 0430   VLDL 16 09/14/2015 0430   LDLCALC 111 (H) 09/14/2015 0430     ASSESSMENT:    1. Chronic diastolic CHF (congestive heart failure) ([place])   2. Essential hypertension   3. High cholesterol   4. Morbid obesity due to excess calories ([place])   5. Diabetes  mellitus type 2, diet-controlled ([place])      PLAN:  In order of problems listed above:  1. CHF: Well compensated, probably euvolemic, NYHA class II. Most important underlying causes obesity and previously untreated/insufficiently treated hypertension with secondary left ventricular hypertrophy. Reviewed daily weight monitoring and sodium restriction. 2. HTN: Blood pressure currently in optimal range 3. HLP: Ideally, LDL less than 100. She is on a very low pravastatin dose and there is room to increase this if necessary 4. DM/Obesity: Weight loss strongly encouraged him discussed in detail. She declined sleep study.    Medication Adjustments/Labs and Tests Ordered: Current medicines are reviewed at length with the patient  today.  Concerns regarding medicines are outlined above.  Medication changes, Labs and Tests ordered today are listed in the Patient Instructions below. Patient Instructions  Dr Sallyanne Kuster recommends that you schedule a follow-up appointment in 1 year. You will receive a reminder letter in the mail two months in advance. If you don't receive a letter, please call our office to schedule the follow-up appointment.  If you need a refill on your cardiac medications before your next appointment, please call your pharmacy.    Signed, Sanda Klein, MD  12/19/2015 2:09 PM    [facility] [place], [place], [place]  [address] Phone: [phone]; Fax: [phone]

## 2015-12-28 ENCOUNTER — Other Ambulatory Visit (HOSPITAL_COMMUNITY)
Admission: RE | Admit: 2015-12-28 | Discharge: 2015-12-28 | Disposition: A | Discharge status: Home / Self Care | Payer: 59 | Source: Ambulatory Visit | Attending: Family Medicine | Admitting: Family Medicine

## 2015-12-28 ENCOUNTER — Other Ambulatory Visit: Payer: Self-pay | Admitting: Family Medicine

## 2015-12-28 DIAGNOSIS — Z01419 Encounter for gynecological examination (general) (routine) without abnormal findings: Secondary | ICD-10-CM | POA: Insufficient documentation

## 2016-01-01 LAB — CYTOLOGY - PAP

## 2016-01-10 ENCOUNTER — Other Ambulatory Visit: Payer: Self-pay | Admitting: Gastroenterology

## 2016-01-28 ENCOUNTER — Other Ambulatory Visit: Payer: Self-pay | Admitting: Family Medicine

## 2016-01-28 DIAGNOSIS — Z1231 Encounter for screening mammogram for malignant neoplasm of breast: Secondary | ICD-10-CM

## 2016-02-19 ENCOUNTER — Encounter (HOSPITAL_COMMUNITY): Payer: Self-pay

## 2016-02-19 ENCOUNTER — Ambulatory Visit (HOSPITAL_COMMUNITY): Admit: 2016-02-19 | Payer: 59 | Admitting: Gastroenterology

## 2016-02-19 SURGERY — COLONOSCOPY WITH PROPOFOL
Anesthesia: Monitor Anesthesia Care

## 2016-02-27 ENCOUNTER — Ambulatory Visit
Admission: RE | Admit: 2016-02-27 | Discharge: 2016-02-27 | Disposition: A | Discharge status: Home / Self Care | Payer: 59 | Source: Ambulatory Visit | Attending: Family Medicine | Admitting: Family Medicine

## 2016-02-27 DIAGNOSIS — Z1231 Encounter for screening mammogram for malignant neoplasm of breast: Secondary | ICD-10-CM

## 2016-07-01 DIAGNOSIS — E1129 Type 2 diabetes mellitus with other diabetic kidney complication: Secondary | ICD-10-CM | POA: Diagnosis not present

## 2016-07-01 DIAGNOSIS — I129 Hypertensive chronic kidney disease with stage 1 through stage 4 chronic kidney disease, or unspecified chronic kidney disease: Secondary | ICD-10-CM | POA: Diagnosis not present

## 2016-07-01 DIAGNOSIS — N183 Chronic kidney disease, stage 3 (moderate): Secondary | ICD-10-CM | POA: Diagnosis not present

## 2016-07-01 DIAGNOSIS — E7800 Pure hypercholesterolemia, unspecified: Secondary | ICD-10-CM | POA: Diagnosis not present

## 2016-07-08 ENCOUNTER — Other Ambulatory Visit: Payer: Self-pay | Admitting: Nurse Practitioner

## 2016-07-08 NOTE — Telephone Encounter (Signed)
Rx(s) sent to pharmacy electronically.  

## 2016-07-17 ENCOUNTER — Emergency Department (HOSPITAL_COMMUNITY): Payer: 59

## 2016-07-17 ENCOUNTER — Encounter (HOSPITAL_COMMUNITY): Payer: Self-pay

## 2016-07-17 ENCOUNTER — Other Ambulatory Visit: Payer: Self-pay

## 2016-07-17 DIAGNOSIS — R0981 Nasal congestion: Secondary | ICD-10-CM | POA: Diagnosis present

## 2016-07-17 DIAGNOSIS — Z7984 Long term (current) use of oral hypoglycemic drugs: Secondary | ICD-10-CM | POA: Insufficient documentation

## 2016-07-17 DIAGNOSIS — I110 Hypertensive heart disease with heart failure: Secondary | ICD-10-CM | POA: Diagnosis not present

## 2016-07-17 DIAGNOSIS — J069 Acute upper respiratory infection, unspecified: Secondary | ICD-10-CM | POA: Diagnosis not present

## 2016-07-17 DIAGNOSIS — I5032 Chronic diastolic (congestive) heart failure: Secondary | ICD-10-CM | POA: Diagnosis not present

## 2016-07-17 DIAGNOSIS — Z91040 Latex allergy status: Secondary | ICD-10-CM | POA: Insufficient documentation

## 2016-07-17 DIAGNOSIS — R531 Weakness: Secondary | ICD-10-CM | POA: Insufficient documentation

## 2016-07-17 DIAGNOSIS — E119 Type 2 diabetes mellitus without complications: Secondary | ICD-10-CM | POA: Insufficient documentation

## 2016-07-17 DIAGNOSIS — R55 Syncope and collapse: Secondary | ICD-10-CM | POA: Diagnosis not present

## 2016-07-17 DIAGNOSIS — Z79899 Other long term (current) drug therapy: Secondary | ICD-10-CM | POA: Insufficient documentation

## 2016-07-17 DIAGNOSIS — R5381 Other malaise: Secondary | ICD-10-CM | POA: Diagnosis not present

## 2016-07-17 DIAGNOSIS — J45909 Unspecified asthma, uncomplicated: Secondary | ICD-10-CM | POA: Diagnosis not present

## 2016-07-17 LAB — CBC
HCT: 43.9 % (ref 36.0–46.0)
Hemoglobin: 15.3 g/dL — ABNORMAL HIGH (ref 12.0–15.0)
MCH: 30.2 pg (ref 26.0–34.0)
MCHC: 34.9 g/dL (ref 30.0–36.0)
MCV: 86.8 fL (ref 78.0–100.0)
Platelets: 247 10*3/uL (ref 150–400)
RBC: 5.06 MIL/uL (ref 3.87–5.11)
RDW: 13 % (ref 11.5–15.5)
WBC: 9.7 10*3/uL (ref 4.0–10.5)

## 2016-07-17 LAB — BASIC METABOLIC PANEL
Anion gap: 10 (ref 5–15)
BUN: 28 mg/dL — ABNORMAL HIGH (ref 6–20)
CO2: 26 mmol/L (ref 22–32)
Calcium: 9.6 mg/dL (ref 8.9–10.3)
Chloride: 100 mmol/L — ABNORMAL LOW (ref 101–111)
Creatinine, Ser: 1.48 mg/dL — ABNORMAL HIGH (ref 0.44–1.00)
GFR calc Af Amer: 43 mL/min — ABNORMAL LOW (ref >60)
GFR calc non Af Amer: 37 mL/min — ABNORMAL LOW (ref >60)
Glucose, Bld: 211 mg/dL — ABNORMAL HIGH (ref 65–99)
Potassium: 5 mmol/L (ref 3.5–5.1)
Sodium: 136 mmol/L (ref 135–145)

## 2016-07-17 LAB — I-STAT TROPONIN, ED: Troponin i, poc: 0 ng/mL (ref 0.00–0.08)

## 2016-07-17 NOTE — ED Triage Notes (Signed)
Pt reports weakness, near syncopal episode today, diarrhea, congestion x 1.5 week. Pt states she was started on [place] last week and has felt like this since then. She notified pcp but he was unable to see her. She also reports chest pain that is tight in nature

## 2016-07-18 ENCOUNTER — Emergency Department (HOSPITAL_COMMUNITY)
Admission: EM | Admit: 2016-07-18 | Discharge: 2016-07-18 | Disposition: A | Discharge status: Home / Self Care | Payer: 59 | Attending: Emergency Medicine | Admitting: Emergency Medicine

## 2016-07-18 DIAGNOSIS — J069 Acute upper respiratory infection, unspecified: Secondary | ICD-10-CM

## 2016-07-18 DIAGNOSIS — R531 Weakness: Secondary | ICD-10-CM

## 2016-07-18 LAB — I-STAT TROPONIN, ED: Troponin i, poc: 0 ng/mL (ref 0.00–0.08)

## 2016-07-18 NOTE — ED Provider Notes (Signed)
[facility] DEPT Provider Note   CSN: [id number] Arrival date & time: 07/17/16  2221     History   Chief Complaint Chief Complaint  Patient presents with  . Chest Pain  . Weakness    HPI Amanda Butler is a 61 y.o. female.  Patient reports chest discomfort over the last several days. She also has symptoms of general malaise, diarrhea, nasal congestion, cough. No vomiting. She started on Januvia last week and started having symptoms after this. Chest discomfort "felt more like tightness". She was concerned that symptoms may be the result of her CHF so she came in for evaluation.    The history is provided by the patient. No language interpreter was used.  Chest Pain   Associated symptoms include cough and weakness. Pertinent negatives include no abdominal pain, no fever and no vomiting.  Weakness  Associated symptoms include chest pain. Pertinent negatives include no vomiting.    Past Medical History:  Diagnosis Date  . Arthritis    "knees" (09/13/2015)  . Asthma   . Chronic diastolic CHF (congestive heart failure) ([place])    a. 09/2015 Echo: EF 55-60%, mild LVH, gr2DD, no rwma, mild MR, mildly dil LA.   Marland Kitchen GERD (gastroesophageal reflux disease)   . High cholesterol   . Hypertension   . Midsternal chest pain    a. 09/2015 MV: no ischemia/infarct, EF 61%; b. 09/2015 Low prob V:Q scan.  . Morbid obesity ([place])   . Obesity   . Pneumonia ~ 2000   "mild case"  . RBBB (right bundle branch block) approx 2010   Dr Marisue Humble did ECG and told her  . Type II diabetes mellitus [facility])     Patient Active Problem List   Diagnosis Date Noted  . Hypertension   . Type II diabetes mellitus ([place])   . High cholesterol   . Morbid obesity ([place])   . Chronic diastolic CHF (congestive heart failure) ([place])   . Midsternal chest pain   . Pain in the chest   . Essential hypertension   . Morbid obesity due to excess calories ([place])   . Chest pain 09/13/2015  . Bilateral lower extremity edema  09/13/2015  . Diabetes mellitus type 2, diet-controlled ([place]) 09/13/2015  . Obesity 09/13/2015  . Total bilirubin, elevated 09/13/2015  . HTN (hypertension) 09/13/2015  . Stasis dermatitis of both legs 09/13/2015  . Left leg cellulitis 09/13/2015  . RBBB 09/13/2015  . Asthma 09/13/2015  . GERD (gastroesophageal reflux disease) 09/13/2015  . 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    Past Surgical History:  Procedure Laterality Date  . BUNIONECTOMY Left   . MULTIPLE TOOTH EXTRACTIONS  ~ 1975  . TENDON RELEASE Right    Leg  . TUBAL LIGATION  ~ 1983    OB History    No data available       Home Medications    Prior to Admission medications   Medication Sig Start Date End Date Taking? Authorizing Provider  albuterol (PROVENTIL HFA;VENTOLIN HFA) 108 (90 BASE) MCG/ACT inhaler Inhale 2 puffs into the lungs 2 (two) times daily as needed for wheezing or shortness of breath.    Yes Historical Provider, MD  famotidine (PEPCID) 10 MG tablet Take 10 mg by mouth daily as needed for heartburn.    Yes Historical Provider, MD  JANUVIA 100 MG tablet Take 100 mg by mouth daily. 07/02/16  Yes Historical Provider, MD  lisinopril (PRINIVIL,ZESTRIL) 10 MG tablet Take 1 tablet (10 mg total) by mouth daily.  07/08/16  Yes Mihai Croitoru, MD  metoprolol succinate (TOPROL-XL) 50 MG 24 hr tablet Take 50 mg by mouth daily. Take with or immediately following a meal.   Yes Historical Provider, MD  mometasone (NASONEX) 50 MCG/ACT nasal spray Place 2 sprays into the nose daily. \   Yes Historical Provider, MD  pravastatin (PRAVACHOL) 20 MG tablet Take 20 mg by mouth daily.   Yes Historical Provider, MD  triamterene-hydrochlorothiazide (MAXZIDE) 75-50 MG per tablet Take 1 tablet by mouth daily.   Yes Historical Provider, MD    Family History Family History  Problem Relation Age of Onset  . Diabetes Mother   . Hypertension Mother   . Diabetes Father   . Hypertension Father   . Heart attack Father 7  . Diabetes  Maternal Aunt   . Diabetes Maternal Grandmother   . Hypertension Maternal Grandmother   . Cancer Maternal Grandfather   . Diabetes Maternal Grandfather   . Hypertension Maternal Grandfather   . Asthma Paternal Grandmother   . Diabetes Paternal Grandmother   . Hypertension Paternal Grandmother   . Diabetes Paternal Grandfather   . Hypertension Paternal Grandfather   . Heart failure Paternal Grandfather     Social History Social History  Substance Use Topics  . Smoking status: Never Smoker  . Smokeless tobacco: Never Used  . Alcohol use No     Allergies   Bee venom; Shellfish allergy; Aspirin; Iodine; Latex; Metformin and related; Morphine and related; and Nsaids   Review of Systems Review of Systems  Constitutional: Positive for fatigue. Negative for chills and fever.  HENT: Positive for congestion.   Respiratory: Positive for cough and chest tightness.   Cardiovascular: Positive for chest pain.  Gastrointestinal: Positive for diarrhea. Negative for abdominal pain and vomiting.  Genitourinary: Negative.   Musculoskeletal: Positive for myalgias.  Skin: Negative.   Neurological: Positive for weakness. Negative for syncope, facial asymmetry and speech difficulty.     Physical Exam Updated Vital Signs BP 107/70   Pulse 62   Temp 98.2 F (36.8 C) (Oral)   Resp 19   Ht 5\' 6"  (1.676 m)   Wt 119.3 kg   SpO2 98%   BMI 42.45 kg/m   Physical Exam  Constitutional: She is oriented to person, place, and time. She appears well-developed and well-nourished.  HENT:  Head: Normocephalic.  Neck: Normal range of motion. Neck supple.  Cardiovascular: Normal rate and regular rhythm.   No murmur heard. Pulmonary/Chest: Effort normal and breath sounds normal. She has no wheezes. She has no rales.  Abdominal: Soft. Bowel sounds are normal. There is no tenderness. There is no rebound and no guarding.  Musculoskeletal: Normal range of motion. She exhibits no edema.  Neurological:  She is alert and oriented to person, place, and time.  Skin: Skin is warm and dry. No rash noted.  Psychiatric: She has a normal mood and affect.     ED Treatments / Results  Labs (all labs ordered are listed, but only abnormal results are displayed) Labs Reviewed  BASIC METABOLIC PANEL - Abnormal; Notable for the following:       Result Value   Chloride 100 (*)    Glucose, Bld 211 (*)    BUN 28 (*)    Creatinine, Ser 1.48 (*)    GFR calc non Af Amer 37 (*)    GFR calc Af Amer 43 (*)    All other components within normal limits  CBC - Abnormal; Notable for the following:  Hemoglobin 15.3 (*)    All other components within normal limits  I-STAT TROPOININ, ED  I-STAT TROPOININ, ED   Results for orders placed or performed during the hospital encounter of 58/09/98  Basic metabolic panel  Result Value Ref Range   Sodium 136 135 - 145 mmol/L   Potassium 5.0 3.5 - 5.1 mmol/L   Chloride 100 (L) 101 - 111 mmol/L   CO2 26 22 - 32 mmol/L   Glucose, Bld 211 (H) 65 - 99 mg/dL   BUN 28 (H) 6 - 20 mg/dL   Creatinine, Ser 1.48 (H) 0.44 - 1.00 mg/dL   Calcium 9.6 8.9 - 10.3 mg/dL   GFR calc non Af Amer 37 (L) >60 mL/min   GFR calc Af Amer 43 (L) >60 mL/min   Anion gap 10 5 - 15  CBC  Result Value Ref Range   WBC 9.7 4.0 - 10.5 K/uL   RBC 5.06 3.87 - 5.11 MIL/uL   Hemoglobin 15.3 (H) 12.0 - 15.0 g/dL   HCT 43.9 36.0 - 46.0 %   MCV 86.8 78.0 - 100.0 fL   MCH 30.2 26.0 - 34.0 pg   MCHC 34.9 30.0 - 36.0 g/dL   RDW 13.0 11.5 - 15.5 %   Platelets 247 150 - 400 K/uL  I-stat troponin, ED  Result Value Ref Range   Troponin i, poc 0.00 0.00 - 0.08 ng/mL   Comment 3          I-stat troponin, ED  Result Value Ref Range   Troponin i, poc 0.00 0.00 - 0.08 ng/mL   Comment 3            EKG  EKG Interpretation  Date/Time:  Thursday July 17 2016 22:28:35 EDT Ventricular Rate:  59 PR Interval:  158 QRS Duration: 124 QT Interval:  424 QTC Calculation: 419 R Axis:   33 Text  Interpretation:  Sinus bradycardia with Premature atrial complexes Right bundle branch block Abnormal ECG No significant change was found Confirmed by Wyvonnia Dusky  MD, STEPHEN [phone]) on 07/18/2016 1:59:36 AM       Radiology Dg Chest 2 View  Result Date: 07/17/2016 CLINICAL DATA:  Weakness with near syncopal episode EXAM: CHEST  2 VIEW COMPARISON:  09/13/2015 FINDINGS: Borderline cardiomegaly with mild central congestion. No focal infiltrate, overt edema or pleural effusion. There are degenerative changes of the spine. IMPRESSION: Borderline cardiomegaly with slight central congestion. No acute infiltrate or edema. Electronically Signed   By: Donavan Foil M.D.   On: 07/17/2016 23:17    Procedures Procedures (including critical care time)  Medications Ordered in ED Medications - No data to display   Initial Impression / Assessment and Plan / ED Course  I have reviewed the triage vital signs and the nursing notes.  Pertinent labs & imaging results that were available during my care of the patient were reviewed by me and considered in my medical decision making (see chart for details).     Patient concerned for CHF exacerbation with symptoms of chest tightness, fatigue, malaise, cough x 1 week. No fever, pain (describes chest tightness), vomiting. Labs are normal, including delta troponin. EKG unchanged. CXR does not show edema or pneumonia.   VSS. No tachycardia or hypoxia. She is felt stable for discharge home with likely viral illness vs medication reaction to [place].   Final Clinical Impressions(s) / ED Diagnoses   Final diagnoses:  None   1. URI 2. Generalized malaise.  New Prescriptions New Prescriptions  No medications on file     Charlann Lange, PA-C 07/18/16 [place], MD 07/18/16 [phone]

## 2016-07-18 NOTE — Discharge Instructions (Signed)
You can be discharged home and should follow up with your doctor for recheck in 2-3 days. Return to the emergency department if symptoms change or worsen. Continue tylenol for any discomfort.

## 2016-07-24 ENCOUNTER — Other Ambulatory Visit: Payer: Self-pay | Admitting: Gastroenterology

## 2016-07-31 ENCOUNTER — Encounter (HOSPITAL_COMMUNITY): Payer: Self-pay | Admitting: Emergency Medicine

## 2016-07-31 ENCOUNTER — Emergency Department (HOSPITAL_COMMUNITY)
Admission: EM | Admit: 2016-07-31 | Discharge: 2016-07-31 | Disposition: A | Discharge status: Home / Self Care | Payer: 59 | Attending: Emergency Medicine | Admitting: Emergency Medicine

## 2016-07-31 ENCOUNTER — Emergency Department (HOSPITAL_COMMUNITY): Payer: 59

## 2016-07-31 DIAGNOSIS — I5032 Chronic diastolic (congestive) heart failure: Secondary | ICD-10-CM | POA: Insufficient documentation

## 2016-07-31 DIAGNOSIS — J45909 Unspecified asthma, uncomplicated: Secondary | ICD-10-CM | POA: Diagnosis not present

## 2016-07-31 DIAGNOSIS — I110 Hypertensive heart disease with heart failure: Secondary | ICD-10-CM | POA: Diagnosis not present

## 2016-07-31 DIAGNOSIS — J189 Pneumonia, unspecified organism: Secondary | ICD-10-CM | POA: Diagnosis not present

## 2016-07-31 DIAGNOSIS — E1165 Type 2 diabetes mellitus with hyperglycemia: Secondary | ICD-10-CM | POA: Insufficient documentation

## 2016-07-31 DIAGNOSIS — J181 Lobar pneumonia, unspecified organism: Secondary | ICD-10-CM | POA: Diagnosis not present

## 2016-07-31 DIAGNOSIS — Z79899 Other long term (current) drug therapy: Secondary | ICD-10-CM | POA: Diagnosis not present

## 2016-07-31 DIAGNOSIS — N289 Disorder of kidney and ureter, unspecified: Secondary | ICD-10-CM | POA: Insufficient documentation

## 2016-07-31 DIAGNOSIS — Z91040 Latex allergy status: Secondary | ICD-10-CM | POA: Insufficient documentation

## 2016-07-31 DIAGNOSIS — I4510 Unspecified right bundle-branch block: Secondary | ICD-10-CM | POA: Diagnosis not present

## 2016-07-31 DIAGNOSIS — Z7984 Long term (current) use of oral hypoglycemic drugs: Secondary | ICD-10-CM | POA: Insufficient documentation

## 2016-07-31 DIAGNOSIS — R0602 Shortness of breath: Secondary | ICD-10-CM | POA: Diagnosis present

## 2016-07-31 LAB — CBC WITH DIFFERENTIAL/PLATELET
Basophils Absolute: 0 10*3/uL (ref 0.0–0.1)
Basophils Relative: 0 %
Eosinophils Absolute: 0.1 10*3/uL (ref 0.0–0.7)
Eosinophils Relative: 1 %
HCT: 41.1 % (ref 36.0–46.0)
Hemoglobin: 14.3 g/dL (ref 12.0–15.0)
Lymphocytes Relative: 11 %
Lymphs Abs: 1.4 10*3/uL (ref 0.7–4.0)
MCH: 30.3 pg (ref 26.0–34.0)
MCHC: 34.8 g/dL (ref 30.0–36.0)
MCV: 87.1 fL (ref 78.0–100.0)
Monocytes Absolute: 0.5 10*3/uL (ref 0.1–1.0)
Monocytes Relative: 4 %
Neutro Abs: 10.1 10*3/uL — ABNORMAL HIGH (ref 1.7–7.7)
Neutrophils Relative %: 84 %
Platelets: 184 10*3/uL (ref 150–400)
RBC: 4.72 MIL/uL (ref 3.87–5.11)
RDW: 13.2 % (ref 11.5–15.5)
WBC: 12.1 10*3/uL — ABNORMAL HIGH (ref 4.0–10.5)

## 2016-07-31 LAB — I-STAT CHEM 8, ED
BUN: 36 mg/dL — ABNORMAL HIGH (ref 6–20)
Calcium, Ion: 1.13 mmol/L — ABNORMAL LOW (ref 1.15–1.40)
Chloride: 104 mmol/L (ref 101–111)
Creatinine, Ser: 1.5 mg/dL — ABNORMAL HIGH (ref 0.44–1.00)
Glucose, Bld: 202 mg/dL — ABNORMAL HIGH (ref 65–99)
HCT: 41 % (ref 36.0–46.0)
Hemoglobin: 13.9 g/dL (ref 12.0–15.0)
Potassium: 4.2 mmol/L (ref 3.5–5.1)
Sodium: 139 mmol/L (ref 135–145)
TCO2: 26 mmol/L (ref 0–100)

## 2016-07-31 MED ORDER — ONDANSETRON HCL 4 MG/2ML IJ SOLN: 4.0000 mg | Freq: Once | INTRAMUSCULAR | Status: DC | PRN

## 2016-07-31 MED ORDER — DOXYCYCLINE HYCLATE 100 MG PO CAPS: 100.0000 mg | ORAL_CAPSULE | Freq: Two times a day (BID) | ORAL | 0 refills | Status: DC

## 2016-07-31 MED ORDER — DOXYCYCLINE HYCLATE 100 MG PO TABS
100.0000 mg | ORAL_TABLET | Freq: Once | ORAL | Status: AC
Start: 2016-07-31 — End: 2016-07-31
  Administered 2016-07-31: 100 mg via ORAL
  Filled 2016-07-31: qty 1

## 2016-07-31 MED ORDER — ALBUTEROL SULFATE (2.5 MG/3ML) 0.083% IN NEBU
2.5000 mg | INHALATION_SOLUTION | Freq: Once | RESPIRATORY_TRACT | Status: AC
  Administered 2016-07-31: 2.5 mg via RESPIRATORY_TRACT
  Filled 2016-07-31: qty 3

## 2016-07-31 NOTE — ED Notes (Signed)
RT paged for respiratory tx.

## 2016-07-31 NOTE — ED Provider Notes (Signed)
[facility] DEPT Provider Note   CSN: [id number] Arrival date & time: 07/31/16  1357     History   Chief Complaint Chief Complaint  Patient presents with  . Shortness of Breath    HPI Amanda Butler is a 61 y.o. female.HPI Patient had a coughing episode 2 hours ago during which time she coughed up some clear sputum accompanied by shortness of breath and one episode of posttussive vomiting she treated herself with her albuterol inhaler with partial relief of breathing she still mildly short of breath. Currently denies any nausea. Nothing made symptoms better or worse. No fever. No other associated symptoms. She reports this feels like asthma she suffered before Past Medical History:  Diagnosis Date  . Arthritis    "knees" (09/13/2015)  . Asthma   . Chronic diastolic CHF (congestive heart failure) ([place])    a. 09/2015 Echo: EF 55-60%, mild LVH, gr2DD, no rwma, mild MR, mildly dil LA.   Marland Kitchen GERD (gastroesophageal reflux disease)   . High cholesterol   . Hypertension   . Midsternal chest pain    a. 09/2015 MV: no ischemia/infarct, EF 61%; b. 09/2015 Low prob V:Q scan.  . Morbid obesity ([place])   . Obesity   . Pneumonia ~ 2000   "mild case"  . RBBB (right bundle branch block) approx 2010   Dr Marisue Humble did ECG and told her  . Type II diabetes mellitus [facility])     Patient Active Problem List   Diagnosis Date Noted  . Hypertension   . Type II diabetes mellitus ([place])   . High cholesterol   . Morbid obesity ([place])   . Chronic diastolic CHF (congestive heart failure) ([place])   . Midsternal chest pain   . Pain in the chest   . Essential hypertension   . Morbid obesity due to excess calories ([place])   . Chest pain 09/13/2015  . Bilateral lower extremity edema 09/13/2015  . Diabetes mellitus type 2, diet-controlled ([place]) 09/13/2015  . Obesity 09/13/2015  . Total bilirubin, elevated 09/13/2015  . HTN (hypertension) 09/13/2015  . Stasis dermatitis of both legs 09/13/2015  . Left leg  cellulitis 09/13/2015  . RBBB 09/13/2015  . Asthma 09/13/2015  . GERD (gastroesophageal reflux disease) 09/13/2015  . 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    Past Surgical History:  Procedure Laterality Date  . BUNIONECTOMY Left   . MULTIPLE TOOTH EXTRACTIONS  ~ 1975  . TENDON RELEASE Right    Leg  . TUBAL LIGATION  ~ 1983    OB History    No data available       Home Medications    Prior to Admission medications   Medication Sig Start Date End Date Taking? Authorizing Provider  albuterol (PROVENTIL HFA;VENTOLIN HFA) 108 (90 BASE) MCG/ACT inhaler Inhale 2 puffs into the lungs 2 (two) times daily as needed for wheezing or shortness of breath.    Yes Historical Provider, MD  famotidine (PEPCID) 10 MG tablet Take 10 mg by mouth daily as needed for heartburn.    Yes Historical Provider, MD  JANUVIA 100 MG tablet Take 100 mg by mouth daily. 07/02/16  Yes Historical Provider, MD  lisinopril (PRINIVIL,ZESTRIL) 10 MG tablet Take 1 tablet (10 mg total) by mouth daily. 07/08/16  Yes Mihai Croitoru, MD  metoprolol succinate (TOPROL-XL) 50 MG 24 hr tablet Take 50 mg by mouth daily. Take with or immediately following a meal.   Yes Historical Provider, MD  mometasone (NASONEX) 50 MCG/ACT nasal spray Place 2  sprays into the nose daily. \   Yes Historical Provider, MD  pravastatin (PRAVACHOL) 20 MG tablet Take 20 mg by mouth daily.   Yes Historical Provider, MD  triamterene-hydrochlorothiazide (MAXZIDE) 75-50 MG per tablet Take 1 tablet by mouth daily.   Yes Historical Provider, MD    Family History Family History  Problem Relation Age of Onset  . Diabetes Mother   . Hypertension Mother   . Diabetes Father   . Hypertension Father   . Heart attack Father 9  . Diabetes Maternal Aunt   . Diabetes Maternal Grandmother   . Hypertension Maternal Grandmother   . Cancer Maternal Grandfather   . Diabetes Maternal Grandfather   . Hypertension Maternal Grandfather   . Asthma Paternal Grandmother   .  Diabetes Paternal Grandmother   . Hypertension Paternal Grandmother   . Diabetes Paternal Grandfather   . Hypertension Paternal Grandfather   . Heart failure Paternal Grandfather     Social History Social History  Substance Use Topics  . Smoking status: Never Smoker  . Smokeless tobacco: Never Used  . Alcohol use No     Allergies   Bee venom; Shellfish allergy; Aspirin; Iodine; Latex; Metformin and related; Morphine and related; and Nsaids   Review of Systems Review of Systems  Respiratory: Positive for cough and shortness of breath.   Gastrointestinal: Positive for vomiting.       One episode of posttussive vomiting  Allergic/Immunologic: Positive for immunocompromised state.       Diabetes     Physical Exam Updated Vital Signs BP (!) 162/86 (BP Location: Right Arm)   Pulse 83   Temp 98 F (36.7 C) (Oral)   Resp 16   Ht 5\' 6"  (1.676 m)   Wt 257 lb (116.6 kg)   SpO2 98%   BMI 41.48 kg/m   Physical Exam  Constitutional: She appears well-developed and well-nourished.  HENT:  Head: Normocephalic and atraumatic.  Eyes: Conjunctivae are normal. Pupils are equal, round, and reactive to light.  Neck: Neck supple. No tracheal deviation present. No thyromegaly present.  Cardiovascular: Normal rate, regular rhythm and normal heart sounds.   No murmur heard. Pulmonary/Chest: Effort normal. No respiratory distress.  Scant diffuse rhonchi  Abdominal: Soft. Bowel sounds are normal. She exhibits no distension. There is no tenderness.  Obese  Musculoskeletal: Normal range of motion. She exhibits no edema or tenderness.  Neurological: She is alert. Coordination normal.  Skin: Skin is warm and dry. No rash noted.  Psychiatric: She has a normal mood and affect.  Nursing note and vitals reviewed.    ED Treatments / Results  Labs (all labs ordered are listed, but only abnormal results are displayed) Labs Reviewed  CBC WITH DIFFERENTIAL/PLATELET  I-STAT CHEM 8, ED     EKG  EKG Interpretation  Date/Time:  Thursday July 31 2016 14:08:20 EDT Ventricular Rate:  71 PR Interval:    QRS Duration: 134 QT Interval:  424 QTC Calculation: 461 R Axis:   55 Text Interpretation:  Sinus rhythm Right bundle branch block No change vs comparison Confirmed by Jeneen Rinks  MD, [place] ([id number]) on 07/31/2016 2:36:26 PM       Radiology Dg Chest 2 View  Result Date: 07/31/2016 CLINICAL DATA:  Weakness and emesis. Nausea. Crackles at the left base. EXAM: CHEST  2 VIEW COMPARISON:  Two-view chest x-ray 07/17/2016 FINDINGS: The heart is mildly enlarged. Progressive left lower lobe airspace disease is present. Right lung is clear. Degenerative changes of the thoracic spine  are stable. IMPRESSION: 1. Developing left lower lobe pneumonia. 2. Borderline cardiomegaly without failure. Electronically Signed   By: San Morelle M.D.   On: 07/31/2016 14:55    Procedures Procedures (including critical care time)  Medications Ordered in ED Medications  albuterol (PROVENTIL) (2.5 MG/3ML) 0.083% nebulizer solution 2.5 mg (not administered)   X-rays viewed by me.   Initial Impression / Assessment and Plan / ED Course  I have reviewed the triage vital signs and the nursing notes. Not convinced the patient has pneumonia, likely or radiographically. Pertinent labs & imaging results that were available during my care of the patient were reviewed by me and considered in my medical decision making (see chart for details).     4:30 PM after one nebulized treatment patient states she is breathing normally for her. She is no respiratory distress. Speaks in paragraphs and is not ill-appearing patient prefers to go home. Plan prescription doxycycline she is encouraged to use her pro-air inhaler 2 puffs every 4 hours if needed first cough or shortness of breath. Return if needed more than every 4 hours or see her primary care physician. She exhibits mild renal insufficiency which is  unchanged from 2 weeks ago. Final Clinical Impressions(s) / ED Diagnoses  Diagnosis #1 community acquired pneumonia #2 renal insufficiency #3 hyperglycemia Final diagnoses:  None    New Prescriptions New Prescriptions   No medications on file     Orlie Dakin, MD 07/31/16 1641

## 2016-07-31 NOTE — ED Triage Notes (Signed)
Pt c/o weakness, SOB, CP, nausea, emesis, nasal congestion onset today a few hours ago. Yellow emesis in bag. Crackles left lower lobe.

## 2016-07-31 NOTE — Discharge Instructions (Signed)
Use your pro-air inhaler 2 puffs every 4 hours as needed for cough or shortness of breath. Return if noted more than every 4 hours or see Dr.Ehinger

## 2016-09-18 ENCOUNTER — Encounter (HOSPITAL_COMMUNITY): Payer: Self-pay

## 2016-09-19 DIAGNOSIS — H6121 Impacted cerumen, right ear: Secondary | ICD-10-CM | POA: Diagnosis not present

## 2016-09-19 DIAGNOSIS — J069 Acute upper respiratory infection, unspecified: Secondary | ICD-10-CM | POA: Diagnosis not present

## 2016-09-23 ENCOUNTER — Encounter (HOSPITAL_COMMUNITY)
Admission: RE | Disposition: A | Discharge status: Home / Self Care | Payer: Self-pay | Source: Ambulatory Visit | Attending: Gastroenterology

## 2016-09-23 ENCOUNTER — Ambulatory Visit (HOSPITAL_COMMUNITY): Payer: 59 | Admitting: Anesthesiology

## 2016-09-23 ENCOUNTER — Ambulatory Visit (HOSPITAL_COMMUNITY)
Admission: RE | Admit: 2016-09-23 | Discharge: 2016-09-23 | Disposition: A | Discharge status: Home / Self Care | Payer: 59 | Source: Ambulatory Visit | Attending: Gastroenterology | Admitting: Gastroenterology

## 2016-09-23 ENCOUNTER — Encounter (HOSPITAL_COMMUNITY): Payer: Self-pay

## 2016-09-23 DIAGNOSIS — Z7984 Long term (current) use of oral hypoglycemic drugs: Secondary | ICD-10-CM | POA: Insufficient documentation

## 2016-09-23 DIAGNOSIS — Z79899 Other long term (current) drug therapy: Secondary | ICD-10-CM | POA: Insufficient documentation

## 2016-09-23 DIAGNOSIS — I509 Heart failure, unspecified: Secondary | ICD-10-CM | POA: Insufficient documentation

## 2016-09-23 DIAGNOSIS — J45909 Unspecified asthma, uncomplicated: Secondary | ICD-10-CM | POA: Insufficient documentation

## 2016-09-23 DIAGNOSIS — Z1211 Encounter for screening for malignant neoplasm of colon: Secondary | ICD-10-CM | POA: Diagnosis not present

## 2016-09-23 DIAGNOSIS — Z91041 Radiographic dye allergy status: Secondary | ICD-10-CM | POA: Diagnosis not present

## 2016-09-23 DIAGNOSIS — I110 Hypertensive heart disease with heart failure: Secondary | ICD-10-CM | POA: Diagnosis not present

## 2016-09-23 DIAGNOSIS — Z86010 Personal history of colonic polyps: Secondary | ICD-10-CM | POA: Diagnosis not present

## 2016-09-23 DIAGNOSIS — I4510 Unspecified right bundle-branch block: Secondary | ICD-10-CM | POA: Insufficient documentation

## 2016-09-23 DIAGNOSIS — Z885 Allergy status to narcotic agent status: Secondary | ICD-10-CM | POA: Diagnosis not present

## 2016-09-23 DIAGNOSIS — Z800 Family history of malignant neoplasm of digestive organs: Secondary | ICD-10-CM | POA: Diagnosis not present

## 2016-09-23 DIAGNOSIS — Z886 Allergy status to analgesic agent status: Secondary | ICD-10-CM | POA: Insufficient documentation

## 2016-09-23 DIAGNOSIS — Z888 Allergy status to other drugs, medicaments and biological substances status: Secondary | ICD-10-CM | POA: Insufficient documentation

## 2016-09-23 DIAGNOSIS — E119 Type 2 diabetes mellitus without complications: Secondary | ICD-10-CM | POA: Diagnosis not present

## 2016-09-23 DIAGNOSIS — D123 Benign neoplasm of transverse colon: Secondary | ICD-10-CM | POA: Insufficient documentation

## 2016-09-23 DIAGNOSIS — M1990 Unspecified osteoarthritis, unspecified site: Secondary | ICD-10-CM | POA: Insufficient documentation

## 2016-09-23 DIAGNOSIS — D120 Benign neoplasm of cecum: Secondary | ICD-10-CM | POA: Diagnosis not present

## 2016-09-23 HISTORY — PX: COLONOSCOPY WITH PROPOFOL: SHX5780

## 2016-09-23 LAB — GLUCOSE, CAPILLARY: Glucose-Capillary: 152 mg/dL — ABNORMAL HIGH (ref 65–99)

## 2016-09-23 SURGERY — COLONOSCOPY WITH PROPOFOL
Anesthesia: Monitor Anesthesia Care

## 2016-09-23 MED ORDER — PROPOFOL 10 MG/ML IV BOLUS
INTRAVENOUS | Status: AC
  Filled 2016-09-23: qty 20

## 2016-09-23 MED ORDER — SODIUM CHLORIDE 0.9 % IV SOLN
INTRAVENOUS | Status: DC | PRN
  Administered 2016-09-23: 10:00:00 via INTRAVENOUS

## 2016-09-23 MED ORDER — ONDANSETRON HCL 4 MG/2ML IJ SOLN
INTRAMUSCULAR | Status: AC
  Filled 2016-09-23: qty 2

## 2016-09-23 MED ORDER — ONDANSETRON HCL 4 MG/2ML IJ SOLN
INTRAMUSCULAR | Status: DC | PRN
  Administered 2016-09-23: 4 mg via INTRAVENOUS

## 2016-09-23 MED ORDER — PROPOFOL 500 MG/50ML IV EMUL
INTRAVENOUS | Status: DC | PRN
  Administered 2016-09-23: 200 ug/kg/min via INTRAVENOUS

## 2016-09-23 MED ORDER — LIDOCAINE 2% (20 MG/ML) 5 ML SYRINGE
INTRAMUSCULAR | Status: DC | PRN
  Administered 2016-09-23: 100 mg via INTRAVENOUS

## 2016-09-23 MED ORDER — LACTATED RINGERS IV SOLN: INTRAVENOUS | Status: DC

## 2016-09-23 MED ORDER — PROPOFOL 10 MG/ML IV BOLUS
INTRAVENOUS | Status: DC | PRN
  Administered 2016-09-23: 30 mg via INTRAVENOUS

## 2016-09-23 MED ORDER — PROPOFOL 10 MG/ML IV BOLUS
INTRAVENOUS | Status: AC
  Filled 2016-09-23: qty 40

## 2016-09-23 MED ORDER — SODIUM CHLORIDE 0.9 % IV SOLN
INTRAVENOUS | Status: DC
  Administered 2016-09-23: 500 mL via INTRAVENOUS

## 2016-09-23 MED ORDER — LIDOCAINE 2% (20 MG/ML) 5 ML SYRINGE
INTRAMUSCULAR | Status: AC
  Filled 2016-09-23: qty 5

## 2016-09-23 SURGICAL SUPPLY — 21 items

## 2016-09-23 NOTE — Op Note (Signed)
[facility] Patient Name: Amanda Butler Procedure Date: 09/23/2016 MRN: [id number] Attending MD: Garlan Fair , MD Date of Birth: 04-18-1955 CSN: [id number] Age: 61 Admit Type: Outpatient Procedure:                Colonoscopy Indications:              High risk colon cancer surveillance: Personal                            history of non-advanced adenoma Providers:                Garlan Fair, MD, Burtis Junes, RN, Tinnie Gens,                            Technician Referring MD:              Medicines:                Propofol per Anesthesia Complications:            No immediate complications. Estimated Blood Loss:     Estimated blood loss was minimal. Procedure:                Pre-Anesthesia Assessment:                           - Prior to the procedure, a History and Physical                            was performed, and patient medications and                            allergies were reviewed. The patient's tolerance of                            previous anesthesia was also reviewed. The risks                            and benefits of the procedure and the sedation                            options and risks were discussed with the patient.                            All questions were answered, and informed consent                            was obtained. Prior Anticoagulants: The patient has                            taken no previous anticoagulant or antiplatelet                            agents. ASA Grade Assessment: II - A patient with  mild systemic disease. After reviewing the risks                            and benefits, the patient was deemed in                            satisfactory condition to undergo the procedure.                           After obtaining informed consent, the colonoscope                            was passed under direct vision. Throughout the                            procedure, the patient's  blood pressure, pulse, and                            oxygen saturations were monitored continuously. The                            EC-[id number] ([id number]) scope was introduced through                            the anus and advanced to the the cecum, identified                            by appendiceal orifice and ileocecal valve. The                            colonoscopy was performed without difficulty. The                            patient tolerated the procedure well. The quality                            of the bowel preparation was good. The appendiceal                            orifice and the rectum were photographed. Findings:      The perianal and digital rectal examinations were normal.      A 3 mm polyp was found in the cecum. The polyp was sessile. The polyp       was removed with a cold snare. Resection and retrieval were complete.      Two sessile polyps were found in the transverse colon. The polyps were 5       mm in size. These polyps were removed with a cold snare. Resection and       retrieval were complete.      The exam was otherwise without abnormality. Impression:               - One 3 mm polyp in the cecum, removed with a cold  snare. Resected and retrieved.                           - Two 5 mm polyps in the transverse colon, removed                            with a cold snare. Resected and retrieved.                           - The examination was otherwise normal. Moderate Sedation:      N/A- Per Anesthesia Care Recommendation:           - Patient has a contact number available for                            emergencies. The signs and symptoms of potential                            delayed complications were discussed with the                            patient. Return to normal activities tomorrow.                            Written discharge instructions were provided to the                            patient.                            - Repeat colonoscopy in 5 years for surveillance.                           - Resume previous diet.                           - Continue present medications. Procedure Code(s):        --- Professional ---                           [phone], Colonoscopy, flexible; with removal of                            tumor(s), polyp(s), or other lesion(s) by snare                            technique Diagnosis Code(s):        --- Professional ---                           Z86.010, Personal history of colonic polyps                           D12.0, Benign neoplasm of cecum  D12.3, Benign neoplasm of transverse colon (hepatic                            flexure or splenic flexure) CPT copyright 2016 American Medical Association. All rights reserved. The codes documented in this report are preliminary and upon coder review may  be revised to meet current compliance requirements. Earle Gell, MD Garlan Fair, MD 09/23/2016 11:16:38 AM This report has been signed electronically. Number of Addenda: 0

## 2016-09-23 NOTE — Anesthesia Postprocedure Evaluation (Signed)
Anesthesia Post Note  Patient: Amanda Butler  Procedure(s) Performed: Procedure(s) (LRB): COLONOSCOPY WITH PROPOFOL (N/A)     Patient location during evaluation: PACU Anesthesia Type: MAC Level of consciousness: awake and alert Pain management: pain level controlled Vital Signs Assessment: post-procedure vital signs reviewed and stable Respiratory status: spontaneous breathing, nonlabored ventilation, respiratory function stable and patient connected to nasal cannula oxygen Cardiovascular status: stable and blood pressure returned to baseline Anesthetic complications: no    Last Vitals:  Vitals:   09/23/16 1115 09/23/16 1120  BP: (!) 115/57 (!) 153/78  Pulse: 65 60  Resp: 19 14  Temp: 36.4 C     Last Pain:  Vitals:   09/23/16 1115  TempSrc: Oral                 Reinette Cuneo S

## 2016-09-23 NOTE — H&P (Signed)
Procedure: Surveillance colonoscopy. 12/06/2009 colonoscopy was performed with removal of two small adenomatous colon polyps  History: The patient is a 61 year old female born 09-Aug-1955. She is scheduled to undergo a surveillance colonoscopy today.  Past medical history: Hypertension. Asthma. Type 2 diabetes mellitus. Right bundle branch block pattern on electrocardiogram. Osteoarthritis. Bilateral tubal ligation. Bunionectomy.  Family history: Maternal grandfather was diagnosed with colon cancer  Medication allergies: Metformin. Aspirin. Morphine. Celebrex. Iodine.  Exam: The patient is alert and lying comfortably on the endoscopy stretcher. Abdomen is soft and nontender to palpation. Lungs are clear to auscultation. Cardiac exam reveals a regular rhythm.  Plan: Proceed with surveillance colonoscopy

## 2016-09-23 NOTE — Anesthesia Preprocedure Evaluation (Signed)
Anesthesia Evaluation  Patient identified by MRN, date of birth, ID band Patient awake    Reviewed: Allergy & Precautions, NPO status , Patient's Chart, lab work & pertinent test results  Airway Mallampati: II  TM Distance: >3 FB Neck ROM: Full    Dental no notable dental hx.    Pulmonary asthma ,    Pulmonary exam normal breath sounds clear to auscultation       Cardiovascular hypertension, Pt. on medications and Pt. on home beta blockers +CHF  Normal cardiovascular exam Rhythm:Regular Rate:Normal     Neuro/Psych negative neurological ROS  negative psych ROS   GI/Hepatic negative GI ROS, Neg liver ROS,   Endo/Other  diabetes  Renal/GU negative Renal ROS  negative genitourinary   Musculoskeletal negative musculoskeletal ROS (+)   Abdominal   Peds negative pediatric ROS (+)  Hematology negative hematology ROS (+)   Anesthesia Other Findings   Reproductive/Obstetrics negative OB ROS                             Anesthesia Physical Anesthesia Plan  ASA: II  Anesthesia Plan: MAC   Post-op Pain Management:    Induction: Intravenous  PONV Risk Score and Plan: 0 and Ondansetron  Airway Management Planned: Simple Face Mask  Additional Equipment:   Intra-op Plan:   Post-operative Plan: Extubation in OR  Informed Consent: I have reviewed the patients History and Physical, chart, labs and discussed the procedure including the risks, benefits and alternatives for the proposed anesthesia with the patient or authorized representative who has indicated his/her understanding and acceptance.   Dental advisory given  Plan Discussed with: CRNA and Surgeon  Anesthesia Plan Comments:         Anesthesia Quick Evaluation

## 2016-09-23 NOTE — Transfer of Care (Signed)
Immediate Anesthesia Transfer of Care Note  Patient: Amanda Butler  Procedure(s) Performed: Procedure(s): COLONOSCOPY WITH PROPOFOL (N/A)  Patient Location: PACU  Anesthesia Type:MAC  Level of Consciousness:  sedated, patient cooperative and responds to stimulation  Airway & Oxygen Therapy:Patient Spontanous Breathing and Patient connected to face mask oxgen  Post-op Assessment:  Report given to PACU RN and Post -op Vital signs reviewed and stable  Post vital signs:  Reviewed and stable  Last Vitals:  Vitals:   09/23/16 0918  BP: (!) 153/69  Pulse: 72  Resp: 14  Temp: 02.2 C    Complications: No apparent anesthesia complications

## 2016-09-23 NOTE — Discharge Instructions (Signed)

## 2016-09-24 ENCOUNTER — Encounter (HOSPITAL_COMMUNITY): Payer: Self-pay | Admitting: Gastroenterology

## 2016-10-03 DIAGNOSIS — E119 Type 2 diabetes mellitus without complications: Secondary | ICD-10-CM | POA: Diagnosis not present

## 2017-01-01 DIAGNOSIS — E7800 Pure hypercholesterolemia, unspecified: Secondary | ICD-10-CM | POA: Diagnosis not present

## 2017-01-01 DIAGNOSIS — Z23 Encounter for immunization: Secondary | ICD-10-CM | POA: Diagnosis not present

## 2017-01-01 DIAGNOSIS — E1129 Type 2 diabetes mellitus with other diabetic kidney complication: Secondary | ICD-10-CM | POA: Diagnosis not present

## 2017-01-01 DIAGNOSIS — I10 Essential (primary) hypertension: Secondary | ICD-10-CM | POA: Diagnosis not present

## 2017-01-14 DIAGNOSIS — J45909 Unspecified asthma, uncomplicated: Secondary | ICD-10-CM | POA: Diagnosis not present

## 2017-01-16 ENCOUNTER — Other Ambulatory Visit: Payer: Self-pay | Admitting: Family Medicine

## 2017-01-16 DIAGNOSIS — Z1231 Encounter for screening mammogram for malignant neoplasm of breast: Secondary | ICD-10-CM

## 2017-01-20 ENCOUNTER — Encounter: Payer: Self-pay | Admitting: Cardiovascular Disease

## 2017-01-20 ENCOUNTER — Ambulatory Visit (INDEPENDENT_AMBULATORY_CARE_PROVIDER_SITE_OTHER): Payer: 59 | Admitting: Cardiovascular Disease

## 2017-01-20 VITALS — BP 139/70 | HR 76 | Ht 66.0 in | Wt 266.0 lb

## 2017-01-20 DIAGNOSIS — F329 Major depressive disorder, single episode, unspecified: Secondary | ICD-10-CM | POA: Diagnosis not present

## 2017-01-20 DIAGNOSIS — F32A Depression, unspecified: Secondary | ICD-10-CM

## 2017-01-20 DIAGNOSIS — E7800 Pure hypercholesterolemia, unspecified: Secondary | ICD-10-CM

## 2017-01-20 DIAGNOSIS — I10 Essential (primary) hypertension: Secondary | ICD-10-CM | POA: Diagnosis not present

## 2017-01-20 DIAGNOSIS — I5032 Chronic diastolic (congestive) heart failure: Secondary | ICD-10-CM | POA: Diagnosis not present

## 2017-01-20 NOTE — Patient Instructions (Signed)
Dr Sallyanne Kuster recommends that you continue on your current medications as directed. Please refer to the Current Medication list given to you today.  Your physician recommends that you return for lab work at your convenience.  Dr Sallyanne Kuster recommends that you schedule a follow-up appointment in 12 months. You will receive a reminder letter in the mail two months in advance. If you don't receive a letter, please call our office to schedule the follow-up appointment.  If you need a refill on your cardiac medications before your next appointment, please call your pharmacy.

## 2017-01-20 NOTE — Progress Notes (Signed)
Cardiology Office Note    Date:  01/20/2017   ID:  Amanda Butler, DOB [birth year], MRN [id number]  PCP:  Gaynelle Arabian, MD  Cardiologist:   Sanda Klein, MD   Chief Complaint  Patient presents with  . Follow-up    sob, DOE, tired all the time , chest pain    History of Present Illness:  Amanda Butler is a 61 y.o. female with chronic diastolic heart failure, morbid obesity, hypertension, hyperlipidemia and diabetes mellitus with normal left ventricular systolic function, left ventricular hypertrophy and evidence of elevated filling pressures by echo.  She has a she gained some weight since her last appointment. She feels poorly. She wakes up feeling tired and is always under stress. 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 She repeatedly brings up the fact that her mother and husband both passed away in rapid succession a couple of years ago. She is unable to take care of all of her duties and nobody is available to help. She cries easily. She thinks she may have depression, and I agree.  He states that when she tries to walk her knees buckle under her. She thinks this is mostly due to arthritis. Generalized fatigue limits her more than dyspnea. She does not have leg edema. Has not had problems with chest pain or dyspnea at rest.  Glycemic control remains suboptimal; the last hemoglobin A1c being 7.7%. She has had side effects with metformin, Januvia and a third medicine that she does not recall the name. All these make her feel weak. Recent labs show creatinine of 1.01 and a fair lipid profile with LDL 101 and HDL 46.  She has not had any focal neurological complaints and has not had palpitations or syncope.  Past Medical History:  Diagnosis Date  . Arthritis    "knees" (09/13/2015)  . Asthma   . Chronic diastolic CHF (congestive heart failure) ([place])    a. 09/2015 Echo: EF 55-60%, mild LVH, gr2DD, no rwma, mild MR, mildly  dil LA.   Marland Kitchen GERD (gastroesophageal reflux disease)   . High cholesterol   . Hypertension   . Midsternal chest pain    a. 09/2015 MV: no ischemia/infarct, EF 61%; b. 09/2015 Low prob V:Q scan.  . Morbid obesity ([place])   . Obesity   . Pneumonia ~ 2000   "mild case"  . RBBB (right bundle branch block) approx 2010   Dr Marisue Humble did ECG and told her  . Type II diabetes mellitus ([place])     Past Surgical History:  Procedure Laterality Date  . BUNIONECTOMY Left   . COLONOSCOPY WITH PROPOFOL N/A 09/23/2016   Procedure: COLONOSCOPY WITH PROPOFOL;  Surgeon: Garlan Fair, MD;  Location: [facility];  Service: Endoscopy;  Laterality: N/A;  . MULTIPLE TOOTH EXTRACTIONS  ~ 1975  . TENDON RELEASE Right    Leg  . TUBAL LIGATION  ~ 1983    Current Medications: Outpatient Medications Prior to Visit  Medication Sig Dispense Refill  . albuterol (PROVENTIL HFA;VENTOLIN HFA) 108 (90 BASE) MCG/ACT inhaler Inhale 2 puffs into the lungs 2 (two) times daily as needed for wheezing or shortness of breath.     . famotidine (PEPCID) 10 MG tablet Take 10 mg by mouth daily as needed for heartburn.     Marland Kitchen JANUVIA 100 MG tablet Take 100 mg by mouth daily.  12  . lisinopril (PRINIVIL,ZESTRIL) 10 MG tablet Take 1  tablet (10 mg total) by mouth daily. 90 tablet 1  . metoprolol succinate (TOPROL-XL) 50 MG 24 hr tablet Take 50 mg by mouth daily. Take with or immediately following a meal.    . mometasone (NASONEX) 50 MCG/ACT nasal spray Place 2 sprays into the nose daily.     . pravastatin (PRAVACHOL) 20 MG tablet Take 20 mg by mouth daily.    Marland Kitchen triamterene-hydrochlorothiazide (MAXZIDE) 75-50 MG per tablet Take 1 tablet by mouth daily.     No facility-administered medications prior to visit.      Allergies:   Bee venom; Shellfish allergy; Aspirin; Iodine; Latex; Metformin and related; Morphine and related; and Nsaids   Social History   Social History  . Marital status: Widowed    Spouse name: N/A  . Number of  children: N/A  . Years of education: N/A   Occupational History  . 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 Smoking status: Never Smoker  . Smokeless tobacco: Never Used  . Alcohol use No  . Drug use: No  . Sexual activity: No   Other Topics Concern  . None   Social History Narrative   Pt lives with disabled daughter, is her primary caregiver.     Family History:  The patient's family history includes Asthma in her paternal grandmother; Cancer in her maternal grandfather; Diabetes in her father, maternal aunt, maternal grandfather, maternal grandmother, mother, paternal grandfather, and paternal grandmother; Heart attack (age of onset: 94) in her father; Heart failure in her paternal grandfather; Hypertension in her father, maternal grandfather, maternal grandmother, mother, paternal grandfather, and paternal grandmother.  Mother had a pacemaker at age 14 ROS:   Please see the history of present illness.    ROS All other systems reviewed and are negative.   PHYSICAL EXAM:   VS:  BP 139/70   Pulse 76   Ht 5\' 6"  (1.676 m)   Wt 266 lb (120.7 kg)   BMI 42.93 kg/m     General: Alert, oriented x3, no distress, morbidly obese; poor personal hygiene Head: no evidence of trauma, PERRL, EOMI, no exophtalmos or lid lag, no myxedema, no xanthelasma; normal ears, nose and oropharynx Neck: normal jugular venous pulsations and no hepatojugular reflux; brisk carotid pulses without delay and no carotid bruits Chest: clear to auscultation, no signs of consolidation by percussion or palpation, normal fremitus, symmetrical and full respiratory excursions Cardiovascular: normal position and quality of the apical impulse, regular rhythm, normal first and second heart sounds, no murmurs, rubs or gallops Abdomen: no tenderness or distention, no masses by palpation, no abnormal pulsatility or arterial bruits, normal bowel sounds, no hepatosplenomegaly Extremities: no  clubbing, cyanosis or edema; 2+ radial, ulnar and brachial pulses bilaterally; 2+ right femoral, posterior tibial and dorsalis pedis pulses; 2+ left femoral, posterior tibial and dorsalis pedis pulses; no subclavian or femoral bruits Neurological: grossly nonfocal Psych: Normal mood and affect   Wt Readings from Last 3 Encounters:  01/20/17 266 lb (120.7 kg)  09/23/16 265 lb (120.2 kg)  07/31/16 257 lb (116.6 kg)      Studies/Labs Reviewed:   EKG:  EKG ordered todayShows normal sinus rhythm, right bundle branch block (old), QTC 465 ms Recent Labs: 07/31/2016: BUN 36; Creatinine, Ser 1.50; Hemoglobin 13.9; Platelets 184; Potassium 4.2; Sodium 139   Lipid Panel    Component Value Date/Time   CHOL 165 09/14/2015 0430   TRIG 82 09/14/2015 0430   HDL 38 (L)  09/14/2015 0430   CHOLHDL 4.3 09/14/2015 0430   VLDL 16 09/14/2015 0430   LDLCALC 111 (H) 09/14/2015 0430   Labs from several weeks ago from Dr. Andrew Au office  creatinine of 1.01, LDL 101 and HDL 46.  ASSESSMENT:    1. Chronic diastolic CHF (congestive heart failure) ([place])   2. Essential hypertension   3. Hypercholesterolemia   4. Morbid obesity ([place])   5. Depression, unspecified depression type      PLAN:  In order of problems listed above:  1. CHF: Well compensated, probably euvolemic, NYHA class II. He does not appear to require loop diuretics, but is on a combination of triamterene and hydrochlorothiazide. Negative ischemic workup several months ago. This appears to be secondary to obesity, deconditioning, previously untreated hypertension with secondary LVH. Reviewed the importance of sodium dietary restriction, weight monitoring, signs and symptoms of heart failure exacerbation. No changes made her medications today. 2. HTN: Blood pressure is close to desirable range, preferably systolic blood pressure would be 130 or less. Because of her complaints of weakness I did not make any changes to her medication, but there  is room to increase her lisinopril. 3. HLP: Right at upper limit of target LDL 100 4. DM/Obesity: Weight loss strongly encouraged and discussed in a lot of detail. She does not have a sleep study. 5. Depression: She has multiple symptoms strongly suggestive of depression and has social stressors that can explain this. Her daughter sees Teryl Lucy, NP and I offered to refer Amanda Butler to her as well. We'll check a TSH as well, but I suspect that her fatigue and weakness are symptoms of depression and deconditioning.    Medication Adjustments/Labs and Tests Ordered: Current medicines are reviewed at length with the patient today.  Concerns regarding medicines are outlined above.  Medication changes, Labs and Tests ordered today are listed in the Patient Instructions below. Patient Instructions  Dr Sallyanne Kuster recommends that you continue on your current medications as directed. Please refer to the Current Medication list given to you today.  Your physician recommends that you return for lab work at your convenience.  Dr Sallyanne Kuster recommends that you schedule a follow-up appointment in 12 months. You will receive a reminder letter in the mail two months in advance. If you don't receive a letter, please call our office to schedule the follow-up appointment.  If you need a refill on your cardiac medications before your next appointment, please call your pharmacy.    Signed, Sanda Klein, MD  01/20/2017 5:37 PM    [address], [place], [place]  [address] Phone: [phone]; Fax: [phone]

## 2017-01-21 LAB — TSH: TSH: 1.55 u[IU]/mL (ref 0.450–4.500)

## 2017-01-26 ENCOUNTER — Telehealth: Payer: Self-pay | Admitting: Cardiovascular Disease

## 2017-01-26 NOTE — Telephone Encounter (Signed)
Notes recorded by Sanda Klein, MD on 01/21/2017 at 8:26 AM EDT  Normal thyroid  Pt notified

## 2017-01-26 NOTE — Telephone Encounter (Signed)
Pt woulf like her thyroid results from last Tuesday please.

## 2017-02-25 ENCOUNTER — Encounter (HOSPITAL_COMMUNITY): Payer: Self-pay | Admitting: *Deleted

## 2017-02-25 ENCOUNTER — Emergency Department (HOSPITAL_COMMUNITY)
Admission: EM | Admit: 2017-02-25 | Discharge: 2017-02-25 | Disposition: A | Discharge status: Home / Self Care | Payer: 59 | Attending: Emergency Medicine | Admitting: Emergency Medicine

## 2017-02-25 DIAGNOSIS — S8001XA Contusion of right knee, initial encounter: Secondary | ICD-10-CM | POA: Diagnosis present

## 2017-02-25 DIAGNOSIS — I509 Heart failure, unspecified: Secondary | ICD-10-CM | POA: Diagnosis not present

## 2017-02-25 DIAGNOSIS — Z91040 Latex allergy status: Secondary | ICD-10-CM | POA: Insufficient documentation

## 2017-02-25 DIAGNOSIS — W010XXA Fall on same level from slipping, tripping and stumbling without subsequent striking against object, initial encounter: Secondary | ICD-10-CM | POA: Diagnosis not present

## 2017-02-25 DIAGNOSIS — Y999 Unspecified external cause status: Secondary | ICD-10-CM | POA: Diagnosis not present

## 2017-02-25 DIAGNOSIS — Y929 Unspecified place or not applicable: Secondary | ICD-10-CM | POA: Insufficient documentation

## 2017-02-25 DIAGNOSIS — S8000XA Contusion of unspecified knee, initial encounter: Secondary | ICD-10-CM

## 2017-02-25 DIAGNOSIS — E119 Type 2 diabetes mellitus without complications: Secondary | ICD-10-CM | POA: Diagnosis not present

## 2017-02-25 DIAGNOSIS — Y939 Activity, unspecified: Secondary | ICD-10-CM | POA: Diagnosis not present

## 2017-02-25 DIAGNOSIS — I110 Hypertensive heart disease with heart failure: Secondary | ICD-10-CM | POA: Insufficient documentation

## 2017-02-25 DIAGNOSIS — Z79899 Other long term (current) drug therapy: Secondary | ICD-10-CM | POA: Diagnosis not present

## 2017-02-25 DIAGNOSIS — J45909 Unspecified asthma, uncomplicated: Secondary | ICD-10-CM | POA: Diagnosis not present

## 2017-02-25 DIAGNOSIS — Z7984 Long term (current) use of oral hypoglycemic drugs: Secondary | ICD-10-CM | POA: Diagnosis not present

## 2017-02-25 DIAGNOSIS — S8002XA Contusion of left knee, initial encounter: Secondary | ICD-10-CM | POA: Diagnosis not present

## 2017-02-25 MED ORDER — ACETAMINOPHEN 325 MG PO TABS
650.0000 mg | ORAL_TABLET | Freq: Once | ORAL | Status: AC
  Administered 2017-02-25: 650 mg via ORAL
  Filled 2017-02-25: qty 2

## 2017-02-25 NOTE — ED Triage Notes (Signed)
Pt complains of right knee pain since tripping and falling today.

## 2017-02-25 NOTE — ED Provider Notes (Signed)
[facility] DEPT Provider Note   CSN: [id number] Arrival date & time: 02/25/17  1639     History   Chief Complaint Chief Complaint  Patient presents with  . Knee Pain    HPI Amanda Butler is a 61 y.o. female.  HPI Patient with history of CHF, diabetes, arthritis of both knees comes in a chief complaint of fall and resultant knee pain. This patient had a mechanical fall prior to emergency room arrival.  She reports that she tripped and fell while ambulance was transferring her daughter.  Patient required help getting up and paramedics wanted her to be checked out.  Patient denies any head trauma, pt has no associated nausea, vomiting, seizures, loss of consciousness or new visual complains, weakness, numbness, dizziness or gait instability.  Patient reports she has moderate knee pain on both sides, right worse than left.   Past Medical History:  Diagnosis Date  . Arthritis    "knees" (09/13/2015)  . Asthma   . Chronic diastolic CHF (congestive heart failure) ([place])    a. 09/2015 Echo: EF 55-60%, mild LVH, gr2DD, no rwma, mild MR, mildly dil LA.   Marland Kitchen GERD (gastroesophageal reflux disease)   . High cholesterol   . Hypertension   . Midsternal chest pain    a. 09/2015 MV: no ischemia/infarct, EF 61%; b. 09/2015 Low prob V:Q scan.  . Morbid obesity ([place])   . Obesity   . Pneumonia ~ 2000   "mild case"  . RBBB (right bundle branch block) approx 2010   Dr Marisue Humble did ECG and told her  . Type II diabetes mellitus [facility])     Patient Active Problem List   Diagnosis Date Noted  . Hypertension   . Type II diabetes mellitus ([place])   . High cholesterol   . Morbid obesity ([place])   . Chronic diastolic CHF (congestive heart failure) ([place])   . Midsternal chest pain   . Pain in the chest   . Essential hypertension   . Morbid obesity due to excess calories ([place])   . Chest pain 09/13/2015  . Bilateral lower extremity edema 09/13/2015  . Diabetes mellitus type  2, diet-controlled ([place]) 09/13/2015  . Obesity 09/13/2015  . Total bilirubin, elevated 09/13/2015  . HTN (hypertension) 09/13/2015  . Stasis dermatitis of both legs 09/13/2015  . Left leg cellulitis 09/13/2015  . RBBB 09/13/2015  . Asthma 09/13/2015  . GERD (gastroesophageal reflux disease) 09/13/2015  . 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    Past Surgical History:  Procedure Laterality Date  . BUNIONECTOMY Left   . MULTIPLE TOOTH EXTRACTIONS  ~ 1975  . TENDON RELEASE Right    Leg  . TUBAL LIGATION  ~ 1983    OB History    No data available       Home Medications    Prior to Admission medications   Medication Sig Start Date End Date Taking? Authorizing Provider  albuterol (PROVENTIL HFA;VENTOLIN HFA) 108 (90 BASE) MCG/ACT inhaler Inhale 2 puffs into the lungs 2 (two) times daily as needed for wheezing or shortness of breath.     [provider]  famotidine (PEPCID) 10 MG tablet Take 10 mg by mouth daily as needed for heartburn.     [provider]  JANUVIA 100 MG tablet Take 100 mg by mouth daily. 07/02/16   [provider]  lisinopril (PRINIVIL,ZESTRIL) 10 MG tablet Take 1 tablet (10 mg total) by mouth daily. 07/08/16   Croitoru, Dani Gobble, MD  metoprolol succinate (TOPROL-XL) 50 MG 24 hr tablet Take 50 mg by mouth daily. Take with or immediately following a meal.    [provider]  mometasone (NASONEX) 50 MCG/ACT nasal spray Place 2 sprays into the nose daily.     [provider]  pravastatin (PRAVACHOL) 20 MG tablet Take 20 mg by mouth daily.    [provider]  triamterene-hydrochlorothiazide (MAXZIDE) 75-50 MG per tablet Take 1 tablet by mouth daily.    [provider]    Family History Family History  Problem Relation Age of Onset  . Diabetes Mother   . Hypertension Mother   . Diabetes Father   . Hypertension Father   . Heart attack Father 59  . Diabetes Maternal Aunt   . Diabetes Maternal Grandmother   .  Hypertension Maternal Grandmother   . Cancer Maternal Grandfather   . Diabetes Maternal Grandfather   . Hypertension Maternal Grandfather   . Asthma Paternal Grandmother   . Diabetes Paternal Grandmother   . Hypertension Paternal Grandmother   . Diabetes Paternal Grandfather   . Hypertension Paternal Grandfather   . Heart failure Paternal Grandfather     Social History Social History   Tobacco Use  . Smoking status: Never Smoker  . Smokeless tobacco: Never Used  Substance Use Topics  . Alcohol use: No  . Drug use: No     Allergies   Bee venom; Shellfish allergy; Aspirin; Iodine; Latex; Metformin and related; Morphine and related; and Nsaids   Review of Systems Review of Systems  Constitutional: Negative for activity change.  Musculoskeletal: Positive for arthralgias and myalgias.  Neurological: Negative for headaches.  Hematological: Does not bruise/bleed easily.     Physical Exam Updated Vital Signs BP (!) 165/81 (BP Location: Right Arm)   Pulse 95   Temp 98.2 F (36.8 C) (Oral)   Resp 15   SpO2 95%   Physical Exam  Constitutional: She is oriented to person, place, and time. She appears well-developed.  HENT:  Head: Normocephalic and atraumatic.  Eyes: EOM are normal.  Neck: Normal range of motion. Neck supple.  Cardiovascular: Normal rate.  Pulmonary/Chest: Effort normal.  Abdominal: Bowel sounds are normal.  Musculoskeletal: She exhibits edema. She exhibits no tenderness or deformity.  Able to ambulate. No knee laxity  Neurological: She is alert and oriented to person, place, and time.  Skin: Skin is warm and dry.  Nursing note and vitals reviewed.    ED Treatments / Results  Labs (all labs ordered are listed, but only abnormal results are displayed) Labs Reviewed - No data to display  EKG  EKG Interpretation None       Radiology No results found.  Procedures Procedures (including critical care time)  Medications Ordered in  ED Medications  acetaminophen (TYLENOL) tablet 650 mg (650 mg Oral Given 02/25/17 1742)     Initial Impression / Assessment and Plan / ED Course  I have reviewed the triage vital signs and the nursing notes.  Pertinent labs & imaging results that were available during my care of the patient were reviewed by me and considered in my medical decision making (see chart for details).     Patient comes in with chief complaint of knee pain.  Patient had a mechanical fall prior to ER arrival.  Patient has mild knee edema/effusion on the right side.  Patient has appropriate range of motion is able to ambulate.  No imaging indicated.  He will treat her knee injury as contusion  and advise RICE.  Final Clinical Impressions(s) / ED Diagnoses   Final diagnoses:  Contusion of knee, unspecified laterality, initial encounter    ED Discharge Orders    None       Varney Biles, MD 02/25/17 1820

## 2017-03-02 ENCOUNTER — Ambulatory Visit: Payer: 59

## 2017-03-20 ENCOUNTER — Ambulatory Visit: Payer: 59

## 2017-04-07 ENCOUNTER — Emergency Department (HOSPITAL_COMMUNITY): Payer: 59

## 2017-04-07 ENCOUNTER — Other Ambulatory Visit: Payer: Self-pay

## 2017-04-07 ENCOUNTER — Emergency Department (HOSPITAL_COMMUNITY)
Admission: EM | Admit: 2017-04-07 | Discharge: 2017-04-07 | Disposition: A | Discharge status: Home / Self Care | Payer: 59 | Attending: Emergency Medicine | Admitting: Emergency Medicine

## 2017-04-07 ENCOUNTER — Encounter (HOSPITAL_COMMUNITY): Payer: Self-pay | Admitting: Emergency Medicine

## 2017-04-07 DIAGNOSIS — Y929 Unspecified place or not applicable: Secondary | ICD-10-CM | POA: Diagnosis not present

## 2017-04-07 DIAGNOSIS — S42252A Displaced fracture of greater tuberosity of left humerus, initial encounter for closed fracture: Secondary | ICD-10-CM | POA: Diagnosis not present

## 2017-04-07 DIAGNOSIS — Z79899 Other long term (current) drug therapy: Secondary | ICD-10-CM | POA: Diagnosis not present

## 2017-04-07 DIAGNOSIS — M25512 Pain in left shoulder: Secondary | ICD-10-CM | POA: Diagnosis not present

## 2017-04-07 DIAGNOSIS — M545 Low back pain: Secondary | ICD-10-CM | POA: Diagnosis not present

## 2017-04-07 DIAGNOSIS — S4991XA Unspecified injury of right shoulder and upper arm, initial encounter: Secondary | ICD-10-CM | POA: Diagnosis present

## 2017-04-07 DIAGNOSIS — I110 Hypertensive heart disease with heart failure: Secondary | ICD-10-CM | POA: Diagnosis not present

## 2017-04-07 DIAGNOSIS — Z7984 Long term (current) use of oral hypoglycemic drugs: Secondary | ICD-10-CM | POA: Insufficient documentation

## 2017-04-07 DIAGNOSIS — Y939 Activity, unspecified: Secondary | ICD-10-CM | POA: Diagnosis not present

## 2017-04-07 DIAGNOSIS — E119 Type 2 diabetes mellitus without complications: Secondary | ICD-10-CM | POA: Insufficient documentation

## 2017-04-07 DIAGNOSIS — T148XXA Other injury of unspecified body region, initial encounter: Secondary | ICD-10-CM | POA: Diagnosis not present

## 2017-04-07 DIAGNOSIS — Z91040 Latex allergy status: Secondary | ICD-10-CM | POA: Diagnosis not present

## 2017-04-07 DIAGNOSIS — I5032 Chronic diastolic (congestive) heart failure: Secondary | ICD-10-CM | POA: Diagnosis not present

## 2017-04-07 DIAGNOSIS — Y999 Unspecified external cause status: Secondary | ICD-10-CM | POA: Insufficient documentation

## 2017-04-07 DIAGNOSIS — R9431 Abnormal electrocardiogram [ECG] [EKG]: Secondary | ICD-10-CM | POA: Diagnosis not present

## 2017-04-07 DIAGNOSIS — J45909 Unspecified asthma, uncomplicated: Secondary | ICD-10-CM | POA: Diagnosis not present

## 2017-04-07 DIAGNOSIS — W1839XA Other fall on same level, initial encounter: Secondary | ICD-10-CM | POA: Insufficient documentation

## 2017-04-07 DIAGNOSIS — S42292A Other displaced fracture of upper end of left humerus, initial encounter for closed fracture: Secondary | ICD-10-CM

## 2017-04-07 DIAGNOSIS — W010XXA Fall on same level from slipping, tripping and stumbling without subsequent striking against object, initial encounter: Secondary | ICD-10-CM

## 2017-04-07 LAB — BASIC METABOLIC PANEL
Anion gap: 12 (ref 5–15)
BUN: 17 mg/dL (ref 6–20)
CO2: 24 mmol/L (ref 22–32)
Calcium: 9.4 mg/dL (ref 8.9–10.3)
Chloride: 101 mmol/L (ref 101–111)
Creatinine, Ser: 0.94 mg/dL (ref 0.44–1.00)
GFR calc Af Amer: >60 mL/min (ref >60)
GFR calc non Af Amer: >60 mL/min (ref >60)
Glucose, Bld: 226 mg/dL — ABNORMAL HIGH (ref 65–99)
Potassium: 3.8 mmol/L (ref 3.5–5.1)
Sodium: 137 mmol/L (ref 135–145)

## 2017-04-07 LAB — CBG MONITORING, ED: Glucose-Capillary: 217 mg/dL — ABNORMAL HIGH (ref 65–99)

## 2017-04-07 LAB — CBC
HCT: 44.3 % (ref 36.0–46.0)
Hemoglobin: 15.3 g/dL — ABNORMAL HIGH (ref 12.0–15.0)
MCH: 30.1 pg (ref 26.0–34.0)
MCHC: 34.5 g/dL (ref 30.0–36.0)
MCV: 87.2 fL (ref 78.0–100.0)
Platelets: 191 10*3/uL (ref 150–400)
RBC: 5.08 MIL/uL (ref 3.87–5.11)
RDW: 13.8 % (ref 11.5–15.5)
WBC: 12.3 10*3/uL — ABNORMAL HIGH (ref 4.0–10.5)

## 2017-04-07 MED ORDER — OXYCODONE-ACETAMINOPHEN 5-325 MG PO TABS: 1.0000 | ORAL_TABLET | ORAL | 0 refills | Status: DC | PRN

## 2017-04-07 MED ORDER — OXYCODONE-ACETAMINOPHEN 5-325 MG PO TABS
1.0000 | ORAL_TABLET | Freq: Once | ORAL | Status: AC
  Administered 2017-04-07: 1 via ORAL
  Filled 2017-04-07: qty 1

## 2017-04-07 NOTE — ED Provider Notes (Signed)
[place] DEPT Provider Note   CSN: [id number] Arrival date & time: 04/07/17  0207     History   Chief Complaint Chief Complaint  Patient presents with  . Back Pain  . Dizziness    HPI Amanda Butler is a 61 y.o. female.  The history is provided by the patient.  She has history of asthma, diastolic heart failure, obesity, hypertension, diabetes.  She presents after falling when she got her heel caught in a crack in the pavement.  She struck her right shoulder against a ambulance that was there to take her daughter to the hospital.  She also hit her forehead.  There was no loss of consciousness, but she was very dizzy.  She denies nausea or vomiting.  She is complaining of pain in her lower back on the right side and states she does have history of herniated disks in her back.  She denies other injury.  She rates her pain at 5/10.  Past Medical History:  Diagnosis Date  . Arthritis    "knees" (09/13/2015)  . Asthma   . Chronic diastolic CHF (congestive heart failure) ([place])    a. 09/2015 Echo: EF 55-60%, mild LVH, gr2DD, no rwma, mild MR, mildly dil LA.   Marland Kitchen GERD (gastroesophageal reflux disease)   . High cholesterol   . Hypertension   . Midsternal chest pain    a. 09/2015 MV: no ischemia/infarct, EF 61%; b. 09/2015 Low prob V:Q scan.  . Morbid obesity ([place])   . Obesity   . Pneumonia ~ 2000   "mild case"  . RBBB (right bundle branch block) approx 2010   Dr Marisue Humble did ECG and told her  . Type II diabetes mellitus [facility])     Patient Active Problem List   Diagnosis Date Noted  . Hypertension   . Type II diabetes mellitus ([place])   . High cholesterol   . Morbid obesity ([place])   . Chronic diastolic CHF (congestive heart failure) ([place])   . Midsternal chest pain   . Pain in the chest   . Essential hypertension   . Morbid obesity due to excess calories ([place])   . Chest pain 09/13/2015  . Bilateral lower extremity edema 09/13/2015  . Diabetes  mellitus type 2, diet-controlled ([place]) 09/13/2015  . Obesity 09/13/2015  . Total bilirubin, elevated 09/13/2015  . HTN (hypertension) 09/13/2015  . Stasis dermatitis of both legs 09/13/2015  . Left leg cellulitis 09/13/2015  . RBBB 09/13/2015  . Asthma 09/13/2015  . GERD (gastroesophageal reflux disease) 09/13/2015  . 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    Past Surgical History:  Procedure Laterality Date  . BUNIONECTOMY Left   . COLONOSCOPY WITH PROPOFOL N/A 09/23/2016   Procedure: COLONOSCOPY WITH PROPOFOL;  Surgeon: Garlan Fair, MD;  Location: [facility] ENDOSCOPY;  Service: Endoscopy;  Laterality: N/A;  . MULTIPLE TOOTH EXTRACTIONS  ~ 1975  . TENDON RELEASE Right    Leg  . TUBAL LIGATION  ~ 1983    OB History    No data available       Home Medications    Prior to Admission medications   Medication Sig Start Date End Date Taking? Authorizing Provider  albuterol (PROVENTIL HFA;VENTOLIN HFA) 108 (90 BASE) MCG/ACT inhaler Inhale 2 puffs into the lungs 2 (two) times daily as needed for wheezing or shortness of breath.    Yes [provider]  albuterol (PROVENTIL) (2.5 MG/3ML) 0.083% nebulizer solution Take 2.5 mg by nebulization every 6 (six)  hours as needed for wheezing or shortness of breath.   Yes [provider]  famotidine (PEPCID) 10 MG tablet Take 10 mg by mouth daily as needed for heartburn.    Yes [provider]  JANUVIA 100 MG tablet Take 100 mg by mouth daily. 07/02/16  Yes [provider]  lisinopril (PRINIVIL,ZESTRIL) 10 MG tablet Take 1 tablet (10 mg total) by mouth daily. 07/08/16  Yes Croitoru, Mihai, MD  metoprolol succinate (TOPROL-XL) 50 MG 24 hr tablet Take 50 mg by mouth daily. Take with or immediately following a meal.   Yes [provider]  mometasone (NASONEX) 50 MCG/ACT nasal spray Place 2 sprays into the nose daily.    Yes [provider]  pravastatin (PRAVACHOL) 20 MG tablet Take 20 mg by mouth daily.    Yes [provider]  triamterene-hydrochlorothiazide (MAXZIDE) 75-50 MG per tablet Take 1 tablet by mouth daily.   Yes [provider]    Family History Family History  Problem Relation Age of Onset  . Diabetes Mother   . Hypertension Mother   . Diabetes Father   . Hypertension Father   . Heart attack Father 7  . Diabetes Maternal Aunt   . Diabetes Maternal Grandmother   . Hypertension Maternal Grandmother   . Cancer Maternal Grandfather   . Diabetes Maternal Grandfather   . Hypertension Maternal Grandfather   . Asthma Paternal Grandmother   . Diabetes Paternal Grandmother   . Hypertension Paternal Grandmother   . Diabetes Paternal Grandfather   . Hypertension Paternal Grandfather   . Heart failure Paternal Grandfather     Social History Social History   Tobacco Use  . Smoking status: Never Smoker  . Smokeless tobacco: Never Used  Substance Use Topics  . Alcohol use: No  . Drug use: No     Allergies   Bee venom; Shellfish allergy; Aspirin; Iodine; Latex; Metformin and related; Morphine and related; and Nsaids   Review of Systems Review of Systems  All other systems reviewed and are negative.    Physical Exam Updated Vital Signs BP (!) 163/80 (BP Location: Right Arm)   Pulse 84   Temp 98.2 F (36.8 C) (Oral)   Resp 16   SpO2 100%   Physical Exam  Nursing note and vitals reviewed.  61 year old female, resting comfortably and in no acute distress. Vital signs are significant for hypertension. Oxygen saturation is 100%, which is normal. Head is normocephalic and atraumatic. PERRLA, EOMI. Oropharynx is clear. Neck is nontender without adenopathy or JVD. Back is mildly tender in the right paralumbar area with no midline tenderness.  There is no CVA tenderness. Lungs are clear without rales, wheezes, or rhonchi. Chest is nontender. Heart has regular rate and rhythm without murmur. Abdomen is soft, flat, nontender without masses or  hepatosplenomegaly and peristalsis is normoactive. Extremities1+ edema.  There is tenderness in the lateral aspect of the left shoulder, but full passive range of motion is present.  Full range of motion of all other joints without pain. Skin is warm and dry without rash. Neurologic: Mental status is normal, cranial nerves are intact, there are no motor or sensory deficits.  ED Treatments / Results  Labs (all labs ordered are listed, but only abnormal results are displayed) Labs Reviewed  BASIC METABOLIC PANEL - Abnormal; Notable for the following components:      Result Value   Glucose, Bld 226 (*)    All other components within normal limits  CBC -  Abnormal; Notable for the following components:   WBC 12.3 (*)    Hemoglobin 15.3 (*)    All other components within normal limits  CBG MONITORING, ED - Abnormal; Notable for the following components:   Glucose-Capillary 217 (*)    All other components within normal limits  URINALYSIS, ROUTINE W REFLEX MICROSCOPIC    EKG  EKG Interpretation  Date/Time:  Tuesday April 07 2017 03:04:30 EST Ventricular Rate:  85 PR Interval:    QRS Duration: 134 QT Interval:  395 QTC Calculation: 470 R Axis:   32 Text Interpretation:  Sinus rhythm Atrial premature complex Probable left atrial enlargement Right bundle branch block When compared with ECG of 07/31/2016, Premature atrial complexes are now present Confirmed by Delora Fuel ([id number]) on 04/07/2017 3:17:46 AM       Radiology Dg Lumbar Spine Complete  Result Date: 04/07/2017 CLINICAL DATA:  Status post fall, with mid lower back pain. Initial encounter. EXAM: LUMBAR SPINE - COMPLETE 4+ VIEW COMPARISON:  MRI of the lumbar spine performed 03/15/2005 FINDINGS: There is no evidence of acute fracture or subluxation. Vertebral bodies demonstrate normal height. There is mild grade 1 anterolisthesis of L4 on L5, reflecting underlying facet disease. Intervertebral disc space narrowing is again noted  at L5-S1. The visualized bowel gas pattern is unremarkable in appearance; air and stool are noted within the colon. The sacroiliac joints are within normal limits. IMPRESSION: 1. No evidence of acute fracture or subluxation along the lumbar spine. 2. Minimal degenerative change at the lower lumbar spine. Electronically Signed   By: Garald Balding M.D.   On: 04/07/2017 05:44   Dg Shoulder Left  Result Date: 04/07/2017 CLINICAL DATA:  Status post fall. Hit left shoulder on car door, with left anterior and lateral shoulder pain. Initial encounter. EXAM: LEFT SHOULDER - 2+ VIEW COMPARISON:  None. FINDINGS: There is a mildly displaced fracture of the greater tuberosity of the left humeral head. The remainder of the left humeral head appears grossly intact. This reflects a Neer 1 part fracture. The left humeral head is seated within the glenoid fossa. Degenerative change is noted at the left acromioclavicular joint. Soft tissue swelling is noted at the left shoulder. The visualized portions of the left lung are clear. IMPRESSION: Mildly displaced fracture of the greater tuberosity of the left humeral head. This reflects a Neer 1 part fracture. Electronically Signed   By: Garald Balding M.D.   On: 04/07/2017 02:33    Procedures Procedures (including critical care time)  Medications Ordered in ED Medications  oxyCODONE-acetaminophen (PERCOCET/ROXICET) 5-325 MG per tablet 1 tablet (not administered)     Initial Impression / Assessment and Plan / ED Course  I have reviewed the triage vital signs and the nursing notes.  Pertinent labs & imaging results that were available during my care of the patient were reviewed by me and considered in my medical decision making (see chart for details).     Fall with injury to left shoulder.  Also struck head and was dizzy.  She will need to be sent for CT of head and cervical spine.  Shoulder x-ray shows mildly displaced fracture of the greater tuberosity of the  humeral head.  She is placed in a sling.  We will also get x-rays of her lumbar spine.  Old records are reviewed, and she has other ED visits for falls.  Lumbar spine x-rays are unremarkable.  Patient was not able to tolerate laying flat for CT scans.  Clinically, she has  no signs of significant head injury so she is discharged with head injury precautions.  She is referred to orthopedics for follow-up of her shoulder fracture.  Prescription given for oxycodone-acetaminophen for pain.  Final Clinical Impressions(s) / ED Diagnoses   Final diagnoses:  Fall from slip, trip, or stumble, initial encounter  Closed fracture of head of left humerus, initial encounter    ED Discharge Orders        Ordered    oxyCODONE-acetaminophen (PERCOCET) 5-325 MG tablet  Every 4 hours PRN     37/85/88 5027       Delora Fuel, MD 74/12/87 [phone]

## 2017-04-07 NOTE — ED Notes (Signed)
Bed: [facility]S01 Expected date:  Expected time:  Means of arrival:  Comments: 61 yo F, Fall L shoulder pain

## 2017-04-07 NOTE — Discharge Instructions (Addendum)
Wear the sling until you see the orthopedic doctor.   Apply ice several times a day.  Take acetaminophen (Tylenol) as needed for less severe pain).

## 2017-04-07 NOTE — ED Triage Notes (Addendum)
Patient complaining of left shoulder pain and numbness from fall hitting her car. Patient is complaining of lower back pain from the fall. Patient states that she hit the left side her head. Patient states she felt dizzy. Another truck was out at patients house picking up daughter and EMS saw patient put herself on the ground.

## 2017-04-07 NOTE — ED Notes (Signed)
Pt states that she wants to wait to know if she is going to be admitted or discharge before putting on her sling.

## 2017-04-07 NOTE — ED Notes (Signed)
Bed: [facility] Expected date:  Expected time:  Means of arrival:  Comments: 

## 2017-04-07 NOTE — ED Notes (Signed)
MADE FIRST URINE REQUEST

## 2017-04-10 ENCOUNTER — Encounter (HOSPITAL_COMMUNITY): Payer: Self-pay | Admitting: Emergency Medicine

## 2017-04-10 ENCOUNTER — Emergency Department (HOSPITAL_COMMUNITY)
Admission: EM | Admit: 2017-04-10 | Discharge: 2017-04-10 | Disposition: A | Discharge status: Home / Self Care | Payer: 59 | Attending: Emergency Medicine | Admitting: Emergency Medicine

## 2017-04-10 DIAGNOSIS — Z7984 Long term (current) use of oral hypoglycemic drugs: Secondary | ICD-10-CM | POA: Insufficient documentation

## 2017-04-10 DIAGNOSIS — L309 Dermatitis, unspecified: Secondary | ICD-10-CM | POA: Diagnosis present

## 2017-04-10 DIAGNOSIS — B368 Other specified superficial mycoses: Secondary | ICD-10-CM | POA: Insufficient documentation

## 2017-04-10 DIAGNOSIS — J45909 Unspecified asthma, uncomplicated: Secondary | ICD-10-CM | POA: Insufficient documentation

## 2017-04-10 DIAGNOSIS — I5032 Chronic diastolic (congestive) heart failure: Secondary | ICD-10-CM | POA: Diagnosis not present

## 2017-04-10 DIAGNOSIS — E119 Type 2 diabetes mellitus without complications: Secondary | ICD-10-CM | POA: Diagnosis not present

## 2017-04-10 DIAGNOSIS — I110 Hypertensive heart disease with heart failure: Secondary | ICD-10-CM | POA: Diagnosis not present

## 2017-04-10 DIAGNOSIS — B369 Superficial mycosis, unspecified: Secondary | ICD-10-CM

## 2017-04-10 DIAGNOSIS — Z91040 Latex allergy status: Secondary | ICD-10-CM | POA: Diagnosis not present

## 2017-04-10 DIAGNOSIS — Z79899 Other long term (current) drug therapy: Secondary | ICD-10-CM | POA: Diagnosis not present

## 2017-04-10 MED ORDER — CLOTRIMAZOLE 1 % EX CREA
TOPICAL_CREAM | Freq: Two times a day (BID) | CUTANEOUS | Status: DC
  Administered 2017-04-10: 11:00:00 via TOPICAL
  Filled 2017-04-10: qty 15

## 2017-04-10 MED ORDER — CEPHALEXIN 500 MG PO CAPS
1000.0000 mg | ORAL_CAPSULE | Freq: Once | ORAL | Status: AC
  Administered 2017-04-10: 1000 mg via ORAL
  Filled 2017-04-10: qty 2

## 2017-04-10 MED ORDER — CLOTRIMAZOLE 1 % EX CREA: TOPICAL_CREAM | CUTANEOUS | 0 refills | Status: DC

## 2017-04-10 MED ORDER — CEPHALEXIN 500 MG PO CAPS: 500.0000 mg | ORAL_CAPSULE | Freq: Four times a day (QID) | ORAL | 0 refills | Status: DC

## 2017-04-10 NOTE — ED Provider Notes (Signed)
[place] DEPT Provider Note   CSN: [id number] Arrival date & time: 04/10/17  4098     History   Chief Complaint Chief Complaint  Patient presents with  . Wound Infection    HPI Amanda Butler is a 61 y.o. female.  Patient c/o skin redness and irritation to skin in skinfold of abdominal pannus on left side for the past few days. Patient indicates recent left shoulder fx, with inability to clean self/clean skin folds like she normally would. In addition, has been sitting a lot in chair (daughter in icu), and says in ICU they wont let her use shower. Symptoms persistent, constant, moderate. No fever or chills.    The history is provided by the patient.    Past Medical History:  Diagnosis Date  . Arthritis    "knees" (09/13/2015)  . Asthma   . Chronic diastolic CHF (congestive heart failure) ([place])    a. 09/2015 Echo: EF 55-60%, mild LVH, gr2DD, no rwma, mild MR, mildly dil LA.   Marland Kitchen GERD (gastroesophageal reflux disease)   . High cholesterol   . Hypertension   . Midsternal chest pain    a. 09/2015 MV: no ischemia/infarct, EF 61%; b. 09/2015 Low prob V:Q scan.  . Morbid obesity ([place])   . Obesity   . Pneumonia ~ 2000   "mild case"  . RBBB (right bundle branch block) approx 2010   Dr Marisue Humble did ECG and told her  . Type II diabetes mellitus [facility])     Patient Active Problem List   Diagnosis Date Noted  . Hypertension   . Type II diabetes mellitus ([place])   . High cholesterol   . Morbid obesity ([place])   . Chronic diastolic CHF (congestive heart failure) ([place])   . Midsternal chest pain   . Pain in the chest   . Essential hypertension   . Morbid obesity due to excess calories ([place])   . Chest pain 09/13/2015  . Bilateral lower extremity edema 09/13/2015  . Diabetes mellitus type 2, diet-controlled ([place]) 09/13/2015  . Obesity 09/13/2015  . Total bilirubin, elevated 09/13/2015  . HTN (hypertension) 09/13/2015  . Stasis dermatitis of both legs  09/13/2015  . Left leg cellulitis 09/13/2015  . RBBB 09/13/2015  . Asthma 09/13/2015  . GERD (gastroesophageal reflux disease) 09/13/2015  . 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    Past Surgical History:  Procedure Laterality Date  . BUNIONECTOMY Left   . COLONOSCOPY WITH PROPOFOL N/A 09/23/2016   Procedure: COLONOSCOPY WITH PROPOFOL;  Surgeon: Garlan Fair, MD;  Location: [facility];  Service: Endoscopy;  Laterality: N/A;  . MULTIPLE TOOTH EXTRACTIONS  ~ 1975  . TENDON RELEASE Right    Leg  . TUBAL LIGATION  ~ 1983    OB History    No data available       Home Medications    Prior to Admission medications   Medication Sig Start Date End Date Taking? Authorizing Provider  albuterol (PROVENTIL HFA;VENTOLIN HFA) 108 (90 BASE) MCG/ACT inhaler Inhale 2 puffs into the lungs 2 (two) times daily as needed for wheezing or shortness of breath.     [provider]  albuterol (PROVENTIL) (2.5 MG/3ML) 0.083% nebulizer solution Take 2.5 mg by nebulization every 6 (six) hours as needed for wheezing or shortness of breath.    [provider]  famotidine (PEPCID) 10 MG tablet Take 10 mg by mouth daily as needed for heartburn.     [provider]  Celesta Gentile  100 MG tablet Take 100 mg by mouth daily. 07/02/16   [provider]  lisinopril (PRINIVIL,ZESTRIL) 10 MG tablet Take 1 tablet (10 mg total) by mouth daily. 07/08/16   Croitoru, Mihai, MD  metoprolol succinate (TOPROL-XL) 50 MG 24 hr tablet Take 50 mg by mouth daily. Take with or immediately following a meal.    [provider]  mometasone (NASONEX) 50 MCG/ACT nasal spray Place 2 sprays into the nose daily.     [provider]  oxyCODONE-acetaminophen (PERCOCET) 5-325 MG tablet Take 1 tablet by mouth every 4 (four) hours as needed for moderate pain. 90/24/09   Delora Fuel, MD  pravastatin (PRAVACHOL) 20 MG tablet Take 20 mg by mouth daily.    [provider]    triamterene-hydrochlorothiazide (MAXZIDE) 75-50 MG per tablet Take 1 tablet by mouth daily.    [provider]    Family History Family History  Problem Relation Age of Onset  . Diabetes Mother   . Hypertension Mother   . Diabetes Father   . Hypertension Father   . Heart attack Father 67  . Diabetes Maternal Aunt   . Diabetes Maternal Grandmother   . Hypertension Maternal Grandmother   . Cancer Maternal Grandfather   . Diabetes Maternal Grandfather   . Hypertension Maternal Grandfather   . Asthma Paternal Grandmother   . Diabetes Paternal Grandmother   . Hypertension Paternal Grandmother   . Diabetes Paternal Grandfather   . Hypertension Paternal Grandfather   . Heart failure Paternal Grandfather     Social History Social History   Tobacco Use  . Smoking status: Never Smoker  . Smokeless tobacco: Never Used  Substance Use Topics  . Alcohol use: No  . Drug use: No     Allergies   Bee venom; Shellfish allergy; Aspirin; Iodine; Latex; Metformin and related; Morphine and related; and Nsaids   Review of Systems Review of Systems  Constitutional: Negative for chills and fever.  HENT: Negative for sore throat.   Eyes: Negative for redness.  Respiratory: Negative for shortness of breath.   Cardiovascular: Negative for chest pain.  Gastrointestinal: Negative for vomiting.  Endocrine: Negative for polyuria.  Genitourinary: Negative for flank pain.  Musculoskeletal: Negative for neck pain.  Skin: Positive for rash.  Neurological: Negative for headaches.  Hematological: Does not bruise/bleed easily.  Psychiatric/Behavioral: Negative for confusion.     Physical Exam Updated Vital Signs BP 136/67 (BP Location: Right Arm)   Pulse 63   Temp 97.7 F (36.5 C) (Oral)   Resp 16   SpO2 98%   Physical Exam  Constitutional: She appears well-developed and well-nourished. No distress.  HENT:  Mouth/Throat: Oropharynx is clear and moist.  Eyes: Conjunctivae  are normal. No scleral icterus.  Neck: Neck supple. No tracheal deviation present.  Cardiovascular: Normal rate.  Pulmonary/Chest: Effort normal. No respiratory distress.  Abdominal: Normal appearance. She exhibits no distension. There is no tenderness.  Obese.   Musculoskeletal: She exhibits no edema.  Neurological: She is alert.  Skin: Skin is warm and dry. She is not diaphoretic.  Fungal dermatitis in skin creases left abdomen, ?mild superimposed cellulitis. No abscess. No crepitus.   Psychiatric: She has a normal mood and affect.  Nursing note and vitals reviewed.    ED Treatments / Results  Labs (all labs ordered are listed, but only abnormal results are displayed) Labs Reviewed - No data to display  EKG  EKG Interpretation None       Radiology No results  found.  Procedures Procedures (including critical care time)  Medications Ordered in ED Medications  clotrimazole (LOTRIMIN) 1 % cream (not administered)  cephALEXin (KEFLEX) capsule 1,000 mg (not administered)     Initial Impression / Assessment and Plan / ED Course  I have reviewed the triage vital signs and the nursing notes.  Pertinent labs & imaging results that were available during my care of the patient were reviewed by me and considered in my medical decision making (see chart for details).  Skin cleaned/dried very well. Nystatin cream.   Keflex po.  Case management consult re home health services to assist with bathing/hygiene, and check on patient and area.   Reviewed nursing notes and prior charts for additional history.     Final Clinical Impressions(s) / ED Diagnoses   Final diagnoses:  None    ED Discharge Orders    None       Lajean Saver, MD 04/10/17 1013

## 2017-04-10 NOTE — Clinical Social Work Note (Signed)
Clinical Social Work Assessment  Patient Details  Name: Amanda Butler MRN: [id number] Date of Birth: 1955/07/21  Date of referral:  04/10/17               Reason for consult:  Discharge Planning, Abuse/Neglect                Permission sought to share information with:    Permission granted to share information::     Name::        Agency::     Relationship::     Contact Information:     Housing/Transportation Living arrangements for the past 2 months:  Single Family Home Source of Information:  Patient Patient Interpreter Needed:  None Criminal Activity/Legal Involvement Pertinent to Current Situation/Hospitalization:    Significant Relationships:  Adult Children Lives with:  Self Do you feel safe going back to the place where you live?  Yes Need for family participation in patient care:  Yes (Comment)  Care giving concerns:Patient came to ED due to needing wound care.    Social Worker assessment / plan:  CSW received consult for disposition planning/ potential APS report. CSW met with patient via bedside regarding discharge plans. Patient currently lives at home and mentions having "clutter everywhere". Patient recently fractured her shoulder and is having increased difficultly taking care of herself. Per nursing staff, patient has large wound that patient is having difficultly taking care off due to new fracture.   Off note, patients daughter is currently at [facility] ICU and patient has been been staying in room with daughter during medical emergency.   Patient will receive [facility] services once she returns home however at this time would like to return to ICU to be with daughter. Patient states understanding that she has difficultly caring for wound and will not receive help caring for wound while visiting with daughter. Patient is very tearful and anxious stating "I just want to leave and go back with my daughter". Patient did request clothes due to not having shower since daughter arrived  to hospital- chaplin paged for assistance. CSW sees no need for APS report at this time.   Employment status:    Insurance informationEducational psychologist PT Recommendations:  Not assessed at this time Information / Referral to community resources:     Patient/Family's Response to care:  Patient appreciated CSW.   Patient/Family's Understanding of and Emotional Response to Diagnosis, Current Treatment, and Prognosis:  Understands current treatment and prognosis.   Emotional Assessment Appearance:  Appears stated age Attitude/Demeanor/Rapport:    Affect (typically observed):  Sad, Tearful/Crying, Anxious Orientation:  Oriented to Self, Oriented to Place, Oriented to  Time, Oriented to Situation Alcohol / Substance use:    Psych involvement (Current and /or in the community):  No (Comment)  Discharge Needs  Concerns to be addressed:  Grief and Loss Concerns, Home Safety Concerns Readmission within the last 30 days:  No Current discharge risk:  None Barriers to Discharge:  No Barriers Identified   Weston Anna, LCSW 04/10/2017, 12:10 PM

## 2017-04-10 NOTE — Discharge Instructions (Signed)
It was our pleasure to provide your ER care today - we hope that you feel better.  It is very important that you keep your skin in this area very clean and dry.  Wash with warm water and mild soap 2x/day, and try area completely - you may use hair dryer on cool/warm setting to help get area dry after cleaning.   After cleaning, apply a thin coat of clotrimazole cream to area.   Also take antibiotic as prescribed.  Follow up with primary care doctor in the next 2-3 days for recheck.  We have also made a home health referral to assist with your care.   Return to ER if worse, new symptoms, fevers, severe pain, spreading redness, other concern.

## 2017-04-10 NOTE — Progress Notes (Signed)
Consulted by social work.  Delivered clothing for pt from [place] closet.    Pt's daughter is patient in [facility] ICU.  Spiritual Care will follow this family for continued support.     [facility] / [place], MDiv

## 2017-04-10 NOTE — Care Management Note (Signed)
Case Management Note  Patient Details  Name: Amanda Butler MRN: [id number] Date of Birth: Oct 08, 1955  Subjective/Objective:                  61 y.o. female c/o skin redness and irritation to skin in skinfold of abdominal pannus on left side for the past few days. Patient indicates recent left shoulder fx, with inability to clean self/clean skin folds like she normally would. In addition, has been sitting a lot in chair (daughter in icu), and says in ICU they wont let her use shower. Symptoms persistent, constant, moderate. No fever or chills.   Action/Plan: CM consulted for HHS for wound care.    CM spoke with pt who states she's had 4 falls recently and the last one on 12/25 caused a broken left arm which happened at the same time the pt was bringing her septic bedridden daughter to the ED.  The pt has been with the daughter in the ICU since that day and reports the daughter almost died so she will not leave the hospital until the daughter starts improving.  Pt was sent down to the ED by the daughter's ICU nurses due to a wound weeping and starting to have a foul odor.  Pt reports that she is not able to reach the wound to clean with her arm being broken and has been having trouble with her ADL's.  Pt was advised by the EDP on 12/25 that she cannot drive for 6 weeks s/p her arm for multiple reasons and in addition, that her car is at home and she cannot drive at night due to vision problems, etc.  CM advised pt that the [facility] team would not be able to see her while she is visiting her daughter in the ICU.  Pt will have to go home for the [facility] visits and could then return to her daughters side.  Pt states she has no one who can drive her back and forth; that her sister works and cannot take time off work.   Pt is aware that if she cannot be at home for the [facility] visits, she will be refusing their services.  Pt has concerns for being able to get to her up coming doctors appointments and is also fearful of  ambulating because she doesn't want to fall again.  Pt additionally mentioned that EMS who picked her daughter up made comments to her about her home living situation and that her daughter shouldn't be living there.  Pt states she could be considered a hoarder but it is her husbands belongings who passed away 4 years ago.  Pt states she is willing to get the house cleaned up but needs help to do it.  She has concerns that when she goes home she will "find a padlock on my door after what they [EMS] said."   CM consulted CSW based on pt's significant social issues and home living conditions.  Pt's [facility] choice was [facility] who could not accept the pt.  Pt's second choice was [facility] who could not accept the pt.  Contacted Cheryl with [facility] who accepted the pt for services and is aware of the social situation.  CM updated Dr. Ashok Cordia.  CM feels that the pt will likely continue to return to the ED for wound care until her daughter is out of the ICU and/or home from the hospital.  No further CM needs noted at this time.  Expected Discharge Date:  04/10/2017               Expected Discharge Plan:  [place]   In-House Referral:  Clinical Social Work  Discharge planning Services  CM Consult  Post Acute Care Choice:  Home Health Choice offered to:  Patient  HH Arranged:  RN, PT, Nurse's Aide, Social Work [employer] Agency:   [employer]  Status of Service:  Completed, signed off  Dwon Sky, Benjaman Lobe, RN 04/10/2017, 11:23 AM

## 2017-04-10 NOTE — ED Triage Notes (Signed)
Pt c/o skin infection on L abdomen. Pt with foul odor and discharge from skin infection. Pt with recent L shoulder fracture and patient states she has difficulty cleaning self. Pt's daughter currently being treated for skin infection in ICU.

## 2017-04-17 ENCOUNTER — Ambulatory Visit (INDEPENDENT_AMBULATORY_CARE_PROVIDER_SITE_OTHER): Payer: Self-pay | Admitting: Orthopaedic Surgery

## 2017-04-17 DIAGNOSIS — E119 Type 2 diabetes mellitus without complications: Secondary | ICD-10-CM | POA: Diagnosis not present

## 2017-04-21 DIAGNOSIS — E119 Type 2 diabetes mellitus without complications: Secondary | ICD-10-CM | POA: Diagnosis not present

## 2017-04-23 ENCOUNTER — Ambulatory Visit (INDEPENDENT_AMBULATORY_CARE_PROVIDER_SITE_OTHER): Payer: 59

## 2017-04-23 ENCOUNTER — Ambulatory Visit (INDEPENDENT_AMBULATORY_CARE_PROVIDER_SITE_OTHER): Payer: 59 | Admitting: Orthopaedic Surgery

## 2017-04-23 ENCOUNTER — Encounter (INDEPENDENT_AMBULATORY_CARE_PROVIDER_SITE_OTHER): Payer: Self-pay | Admitting: Orthopaedic Surgery

## 2017-04-23 DIAGNOSIS — S42255A Nondisplaced fracture of greater tuberosity of left humerus, initial encounter for closed fracture: Secondary | ICD-10-CM | POA: Insufficient documentation

## 2017-04-23 NOTE — Progress Notes (Signed)
Office Visit Note   Patient: Amanda Butler           Date of Birth: 20-Aug-1955           MRN: [id number] Visit Date: 04/23/2017              Requested by: Gaynelle Arabian, MD [address] [place], [place] [address] PCP: Gaynelle Arabian, MD   Assessment & Plan: Visit Diagnoses:  1. Closed nondisplaced fracture of greater tuberosity of left humerus, initial encounter     Plan: At this point we are to go ahead and send Chrisma to outpatient physical therapy to work on motion and strength.  I have also included gait training and balance.  She is to wear her sling when out in public.  Follow-up with [place] in 3 weeks time for repeat evaluation and x-ray. Fracture code for her asked me to Follow-Up Instructions: Return in about 3 weeks (around 05/14/2017).   Orders:  Orders Placed This Encounter  Procedures  . XR Shoulder Left   No orders of the defined types were placed in this encounter.     Procedures: No procedures performed   Clinical Data: No additional findings.   Subjective: Chief Complaint  Patient presents with  . Left Shoulder - Pain, Injury    HPI Amanda Butler is a pleasant 62 year old female who presents to our clinic today for follow-up of her left shoulder.  On 04/07/2017 she lost her balance falling on her left side.  She was taken to the emergency department due to associated dizziness.  X-rays were obtained of her left shoulder which showed a mildly displaced greater tuberosity fracture.  She was placed in a sling and given a few session of home health physical therapy.  She also notes she has a physically disabled child and she is the primary caretaker.  Review of Systems as detailed in HPI all others reviewed and are negative.   Objective: Vital Signs: There were no vitals taken for this visit.  Physical Exam well-developed well-nourished female in no acute distress.  Alert and oriented x3.  Ortho Exam examination of her left shoulder reveals  moderate tenderness over the greater tuberosity.  50% range of motion of the shoulder.  She is neurovascular intact distally.  Specialty Comments:  No specialty comments available.  Imaging: Xr Shoulder Left  Result Date: 04/23/2017 Imaging of the left shoulder reveals no further displacement of the greater tuberosity fracture.      PMFS History: Patient Active Problem List   Diagnosis Date Noted  . Closed nondisplaced fracture of greater tuberosity of left humerus 04/23/2017  . Hypertension   . Type II diabetes mellitus ([place])   . High cholesterol   . Morbid obesity ([place])   . Chronic diastolic CHF (congestive heart failure) ([place])   . Midsternal chest pain   . Pain in the chest   . Essential hypertension   . Morbid obesity due to excess calories ([place])   . Chest pain 09/13/2015  . Bilateral lower extremity edema 09/13/2015  . Diabetes mellitus type 2, diet-controlled ([place]) 09/13/2015  . Obesity 09/13/2015  . Total bilirubin, elevated 09/13/2015  . HTN (hypertension) 09/13/2015  . Stasis dermatitis of both legs 09/13/2015  . Left leg cellulitis 09/13/2015  . RBBB 09/13/2015  . Asthma 09/13/2015  . GERD (gastroesophageal reflux disease) 09/13/2015  . 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   Past Medical History:  Diagnosis Date  . Arthritis    "knees" (09/13/2015)  .  Asthma   . Chronic diastolic CHF (congestive heart failure) ([place])    a. 09/2015 Echo: EF 55-60%, mild LVH, gr2DD, no rwma, mild MR, mildly dil LA.   Marland Kitchen GERD (gastroesophageal reflux disease)   . High cholesterol   . Hypertension   . Midsternal chest pain    a. 09/2015 MV: no ischemia/infarct, EF 61%; b. 09/2015 Low prob V:Q scan.  . Morbid obesity ([place])   . Obesity   . Pneumonia ~ 2000   "mild case"  . RBBB (right bundle branch block) approx 2010   Dr Marisue Humble did ECG and told her  . Type II diabetes mellitus (H[facility])     Family History  Problem Relation Age of Onset  . Diabetes Mother   . Hypertension Mother   .  Diabetes Father   . Hypertension Father   . Heart attack Father 48  . Diabetes Maternal Aunt   . Diabetes Maternal Grandmother   . Hypertension Maternal Grandmother   . Cancer Maternal Grandfather   . Diabetes Maternal Grandfather   . Hypertension Maternal Grandfather   . Asthma Paternal Grandmother   . Diabetes Paternal Grandmother   . Hypertension Paternal Grandmother   . Diabetes Paternal Grandfather   . Hypertension Paternal Grandfather   . Heart failure Paternal Grandfather     Past Surgical History:  Procedure Laterality Date  . BUNIONECTOMY Left   . COLONOSCOPY WITH PROPOFOL N/A 09/23/2016   Procedure: COLONOSCOPY WITH PROPOFOL;  Surgeon: Garlan Fair, MD;  Location: [facility];  Service: Endoscopy;  Laterality: N/A;  . MULTIPLE TOOTH EXTRACTIONS  ~ 1975  . TENDON RELEASE Right    Leg  . TUBAL LIGATION  ~ 1983   Social History   Occupational History  . 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 Smoking status: Never Smoker  . Smokeless tobacco: Never Used  Substance and Sexual Activity  . Alcohol use: No  . Drug use: No  . Sexual activity: No

## 2017-04-24 DIAGNOSIS — B369 Superficial mycosis, unspecified: Secondary | ICD-10-CM | POA: Diagnosis not present

## 2017-04-24 DIAGNOSIS — S42202D Unspecified fracture of upper end of left humerus, subsequent encounter for fracture with routine healing: Secondary | ICD-10-CM | POA: Diagnosis not present

## 2017-04-24 DIAGNOSIS — E119 Type 2 diabetes mellitus without complications: Secondary | ICD-10-CM | POA: Diagnosis not present

## 2017-04-27 ENCOUNTER — Ambulatory Visit: Payer: 59

## 2017-04-30 ENCOUNTER — Ambulatory Visit: Payer: 59 | Attending: Orthopaedic Surgery | Admitting: Physical Therapy

## 2017-04-30 ENCOUNTER — Encounter: Payer: Self-pay | Admitting: Physical Therapy

## 2017-04-30 DIAGNOSIS — M25512 Pain in left shoulder: Secondary | ICD-10-CM | POA: Diagnosis not present

## 2017-04-30 DIAGNOSIS — M25612 Stiffness of left shoulder, not elsewhere classified: Secondary | ICD-10-CM | POA: Diagnosis present

## 2017-04-30 DIAGNOSIS — M6281 Muscle weakness (generalized): Secondary | ICD-10-CM | POA: Diagnosis present

## 2017-05-01 ENCOUNTER — Other Ambulatory Visit: Payer: Self-pay

## 2017-05-01 ENCOUNTER — Encounter: Payer: Self-pay | Admitting: Physical Therapy

## 2017-05-01 NOTE — Therapy (Addendum)
[place], [place], [address] Phone: [phone]   Fax:  [phone]  Physical Therapy Evaluation/ Discharge  Patient Details  Name: Amanda Butler MRN: [id number] Date of Birth: 31-Jul-1955 Referring Provider: Dr Frankey Shown    Encounter Date: 04/30/2017  PT End of Session - 05/01/17 1141    Visit Number  1    Number of Visits  16    Date for PT Re-Evaluation  06/26/17    Authorization Type  [employer] 10 dollar co-pay     PT Start Time  1330    PT Stop Time  1415    PT Time Calculation (min)  45 min    Activity Tolerance  Patient tolerated treatment well    Behavior During Therapy  [facility] for tasks assessed/performed       Past Medical History:  Diagnosis Date  . Arthritis    "knees" (09/13/2015)  . Asthma   . Chronic diastolic CHF (congestive heart failure) ([place])    a. 09/2015 Echo: EF 55-60%, mild LVH, gr2DD, no rwma, mild MR, mildly dil LA.   Marland Kitchen GERD (gastroesophageal reflux disease)   . High cholesterol   . Hypertension   . Midsternal chest pain    a. 09/2015 MV: no ischemia/infarct, EF 61%; b. 09/2015 Low prob V:Q scan.  . Morbid obesity ([place])   . Obesity   . Pneumonia ~ 2000   "mild case"  . RBBB (right bundle branch block) approx 2010   Dr Marisue Humble did ECG and told her  . Type II diabetes mellitus ([place])     Past Surgical History:  Procedure Laterality Date  . BUNIONECTOMY Left   . COLONOSCOPY WITH PROPOFOL N/A 09/23/2016   Procedure: COLONOSCOPY WITH PROPOFOL;  Surgeon: Garlan Fair, MD;  Location: [facility];  Service: Endoscopy;  Laterality: N/A;  . MULTIPLE TOOTH EXTRACTIONS  ~ 1975  . TENDON RELEASE Right    Leg  . TUBAL LIGATION  ~ 1983    There were no vitals filed for this visit.   Subjective Assessment - 04/30/17 1330    Subjective  Patient had a fall on Christmas day. She suffered a non-displaced fracture of her left shoulder. She is currently in her sling but is coming out of  the sling at home as much as she can tolerate. She is taking it out 2-3x a day at home.     Limitations  House hold activities;Lifting    Currently in Pain?  Yes    Pain Score  2     Pain Location  Shoulder    Pain Orientation  Left    Pain Type  Chronic pain    Pain Onset  More than a month ago    Pain Frequency  Intermittent    Aggravating Factors   use of the arm     Pain Relieving Factors  rest     Effect of Pain on Daily Activities  difficulty using her left arm.          [facility] PT Assessment - 05/01/17 0001      Assessment   Medical Diagnosis  Left Shoulder Pain     Referring Provider  Dr Frankey Shown     Onset Date/Surgical Date  04/07/17    Hand Dominance  Right    Next MD Visit  February 4th     Prior Therapy  None       Precautions   Precautions  Shoulder  Required Braces or Orthoses  Sling      Restrictions   Weight Bearing Restrictions  Yes    LUE Weight Bearing  Non weight bearing      Balance Screen   Has the patient fallen in the past 6 months  Yes    How many times?  2    Has the patient had a decrease in activity level because of a fear of falling?   Yes    Is the patient reluctant to leave their home because of a fear of falling?   No      Home Environment   Additional Comments  Wheelchair ramp       Prior Function   Level of Independence  Independent with basic ADLs    Vocation  Unemployed    Vocation Requirements  Not formally working but is taking care of her daughter full     Leisure  Stage manager   Overall Cognitive Status  Within Functional Limits for tasks assessed    Attention  Focused    Focused Attention  Appears intact    Memory  Appears intact    Awareness  Appears intact    Problem Solving  Appears intact      Observation/Other Assessments   Focus on Therapeutic Outcomes (FOTO)   57% limitation       Sensation   Additional Comments  numbness in the left thumb      Coordination   Gross Motor Movements are Fluid  and Coordinated  Yes    Fine Motor Movements are Fluid and Coordinated  Yes      AROM   Overall AROM Comments  No active ROM measured 2nd to weakness and pain       PROM   PROM Assessment Site  Shoulder    Right/Left Shoulder  Left    Left Shoulder Flexion  130 Degrees    Left Shoulder Internal Rotation  50 Degrees    Left Shoulder External Rotation  0 Degrees      Strength   Strength Assessment Site  Shoulder    Right/Left Shoulder  Left    Left Shoulder Flexion  3/5    Left Shoulder Internal Rotation  3/5    Left Shoulder External Rotation  4/5      Palpation   Palpation comment  tenderness to palpation in the anterior shoulder and around the medial deltoid area.       Transfers   Comments  Mod a to transfer from supine to sit 2nd to inability to use her right ar,m              Objective measurements completed on examination: See above findings.      [facility] Adult PT Treatment/Exercise - 05/01/17 0001      Shoulder Exercises: Supine   Other Supine Exercises  wand ER       Shoulder Exercises: Seated   Other Seated Exercises  scap retraction 2x10       Shoulder Exercises: Standing   Other Standing Exercises  pendulums 3 way              PT Education - 05/01/17 1140    Education provided  Yes    Education Details  reviewed HEP' symptom mangement; improtance of moevement     Person(s) Educated  Patient    Methods  Explanation;Demonstration;Tactile cues;Verbal cues;Handout    Comprehension  Returned demonstration;Verbalized understanding;Verbal cues required;Tactile cues required  PT Short Term Goals - 05/01/17 1149      PT SHORT TERM GOAL #1   Title  Patient will increase passive shoulder flexion by 25 degrees     Time  4    Period  Weeks    Status  New    Target Date  05/29/17      PT SHORT TERM GOAL #2   Title  Patient will increase gorss left upper extremity strength to 4/5     Time  4    Period  Weeks    Status  New    Target Date   05/29/17      PT SHORT TERM GOAL #3   Title  Patient will go without her sling for 1 full day     Time  4    Period  Weeks    Status  New    Target Date  05/29/17      PT SHORT TERM GOAL #4   Title  Patient will increase left passive ER by 20 degrees     Time  4    Period  Weeks    Status  New    Target Date  05/29/17        PT Long Term Goals - 05/01/17 1151      PT LONG TERM GOAL #1   Title  Patient will reach to a shelf without pain with left arm with a 1lb weight     Time  8    Period  Weeks    Status  New    Target Date  06/26/17      PT LONG TERM GOAL #2   Title  Patient will reach behind her head without pain in order to wash her hair     Time  8    Period  Weeks    Status  New    Target Date  06/26/17      PT LONG TERM GOAL #3   Title  Patient will reach behind her back without pain in order to perfrom hygiene tasks     Time  8    Period  Weeks    Status  New    Target Date  06/26/17             Plan - 05/01/17 1142    Clinical Impression Statement  Patient is a 62 year old female S/P a minimaly displaced upper extremity fracture. She presents with expected limitations in strength and range of motion. She would benefit from skilled therapy to improve the functional use of her left upper extremity.     History and Personal Factors relevant to plan of care:  CHF; obesity, diabetes    Clinical Presentation  Evolving    Clinical Presentation due to:  limited use, ROM, and strength of the left upper extremity     Clinical Decision Making  Moderate    Rehab Potential  Good    Clinical Impairments Affecting Rehab Potential  DMII, CHF ( can not lay flat )     PT Frequency  2x / week    PT Duration  8 weeks    PT Treatment/Interventions  ADLs/Self Care Home Management;Cryotherapy;Electrical Stimulation;Iontophoresis 74m/ml Dexamethasone;Moist Heat;Therapeutic activities;Therapeutic exercise;Patient/family education;Neuromuscular re-education;Manual  techniques;Passive range of motion;Taping    PT Next Visit Plan  review pendulums; PROM; scpaular strengthening; cryotherapy; patient can not le flat 2nd to CHF     PT Home Exercise Plan  pendulums; cane ER seated; scap retraction  Consulted and Agree with Plan of Care  Patient       Patient will benefit from skilled therapeutic intervention in order to improve the following deficits and impairments:  Pain, Impaired UE functional use, Impaired flexibility, Decreased activity tolerance, Decreased range of motion, Decreased strength, Postural dysfunction  Visit Diagnosis: Acute pain of left shoulder  Stiffness of left shoulder, not elsewhere classified  Muscle weakness (generalized)   PHYSICAL THERAPY DISCHARGE SUMMARY  Visits from Start of Care: 1  Current functional level related to goals / functional outcomes: Unknown Patient did not return for follow up    Remaining deficits: Unknown     Education / Equipment: Unknown   Plan: Patient agrees to discharge.  Patient goals were not met. Patient is being discharged due to not returning since the last visit.  ?????      Problem List Patient Active Problem List   Diagnosis Date Noted  . Closed nondisplaced fracture of greater tuberosity of left humerus 04/23/2017  . Hypertension   . Type II diabetes mellitus ([place])   . High cholesterol   . Morbid obesity ([place])   . Chronic diastolic CHF (congestive heart failure) ([place])   . Midsternal chest pain   . Pain in the chest   . Essential hypertension   . Morbid obesity due to excess calories ([place])   . Chest pain 09/13/2015  . Bilateral lower extremity edema 09/13/2015  . Diabetes mellitus type 2, diet-controlled ([place]) 09/13/2015  . Obesity 09/13/2015  . Total bilirubin, elevated 09/13/2015  . HTN (hypertension) 09/13/2015  . Stasis dermatitis of both legs 09/13/2015  . Left leg cellulitis 09/13/2015  . RBBB 09/13/2015  . Asthma 09/13/2015  . GERD (gastroesophageal reflux  disease) 09/13/2015  . 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    Carney Living  PT DPT  05/01/2017, 11:57 AM  [facility] [address] [place], [place], [address] Phone: [phone]   Fax:  [phone]  Name: AVIYANNA COLBAUGH MRN: [id number] Date of Birth: 02-03-1956

## 2017-05-05 ENCOUNTER — Ambulatory Visit: Payer: 59 | Admitting: Physical Therapy

## 2017-05-06 ENCOUNTER — Encounter: Payer: 59 | Admitting: Physical Therapy

## 2017-05-18 ENCOUNTER — Encounter (INDEPENDENT_AMBULATORY_CARE_PROVIDER_SITE_OTHER): Payer: Self-pay | Admitting: Orthopaedic Surgery

## 2017-05-18 ENCOUNTER — Ambulatory Visit (INDEPENDENT_AMBULATORY_CARE_PROVIDER_SITE_OTHER): Payer: 59

## 2017-05-18 ENCOUNTER — Ambulatory Visit (INDEPENDENT_AMBULATORY_CARE_PROVIDER_SITE_OTHER): Payer: 59 | Admitting: Orthopaedic Surgery

## 2017-05-18 DIAGNOSIS — S42255A Nondisplaced fracture of greater tuberosity of left humerus, initial encounter for closed fracture: Secondary | ICD-10-CM

## 2017-05-18 DIAGNOSIS — M25512 Pain in left shoulder: Secondary | ICD-10-CM

## 2017-05-18 DIAGNOSIS — G8929 Other chronic pain: Secondary | ICD-10-CM

## 2017-05-18 NOTE — Progress Notes (Signed)
Patient is approximately 10 weeks status post greater tuberosity fracture.  She has had only limited visits with physical therapy due to scheduling and lack of availability of the physical therapist.  She is overall feeling better.  Physical exam is consistent with adhesive capsulitis.  Her x-rays are consistent with continued healing and bony consolidation.  At this point I gave her a new referral to physical therapy at a new location.  She may progress to strengthening and weightbearing as tolerated.  I would refer her for a cortisone injection in the shoulder to help loosen things up.  I will see her back in 6 weeks for recheck.

## 2017-05-21 DIAGNOSIS — M6281 Muscle weakness (generalized): Secondary | ICD-10-CM | POA: Diagnosis not present

## 2017-05-21 DIAGNOSIS — M25612 Stiffness of left shoulder, not elsewhere classified: Secondary | ICD-10-CM | POA: Diagnosis not present

## 2017-05-21 DIAGNOSIS — M25512 Pain in left shoulder: Secondary | ICD-10-CM | POA: Diagnosis not present

## 2017-05-25 DIAGNOSIS — M25512 Pain in left shoulder: Secondary | ICD-10-CM | POA: Diagnosis not present

## 2017-05-25 DIAGNOSIS — M25612 Stiffness of left shoulder, not elsewhere classified: Secondary | ICD-10-CM | POA: Diagnosis not present

## 2017-05-25 DIAGNOSIS — M6281 Muscle weakness (generalized): Secondary | ICD-10-CM | POA: Diagnosis not present

## 2017-05-25 IMAGING — NM NM PULMONARY VENT & PERF
16 series · 16 of 16 positions shown · non-contrast
Comparison: Chest radiograph September 13, 2015

CLINICAL DATA: Chest pain

EXAM:
NUCLEAR MEDICINE VENTILATION - PERFUSION LUNG SCAN
Views: Anterior, posterior, left lateral, right lateral, RPO, LPO,
RAO, LAO -ventilation and perfusion
RADIOPHARMACEUTICALS:  31.2 mCi 1echnetium-VVm DTPA aerosol
inhalation and 4.38 mCi 1echnetium-VVm MAA IV

[Series 1: ant/post vent · 4.14mm/px · 1 of 1 slices shown (1 of 2)]
[im 1/1]
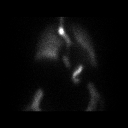

[Series 1: ant/post vent · 4.14mm/px · 1 of 1 slices shown (2 of 2)]
[im 1/1]
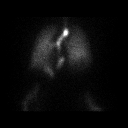

[Series 2: lao/rpo vent · 4.14mm/px · 1 of 1 slices shown (1 of 2)]
[im 1/1]
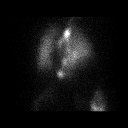

[Series 2: lao/rpo vent · 4.14mm/px · 1 of 1 slices shown (2 of 2)]
[im 1/1]
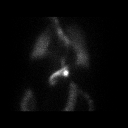

[Series 3: lpo/rao vent · 4.14mm/px · 1 of 1 slices shown (1 of 2)]
[im 1/1]
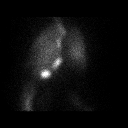

[Series 3: lpo/rao vent · 4.14mm/px · 1 of 1 slices shown (2 of 2)]
[im 1/1]
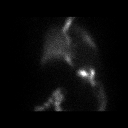

[Series 4: lt lat/rt lat vent · 4.14mm/px · 1 of 1 slices shown (1 of 2)]
[im 1/1]
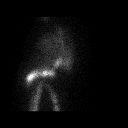

[Series 4: lt lat/rt lat vent · 4.14mm/px · 1 of 1 slices shown (2 of 2)]
[im 1/1]
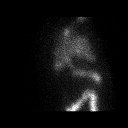

[Series 5: lt lat/rt lat perf · 4.14mm/px · 1 of 1 slices shown (1 of 2)]
[im 1/1]
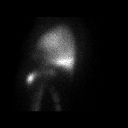

[Series 5: lt lat/rt lat perf · 4.14mm/px · 1 of 1 slices shown (2 of 2)]
[im 1/1]
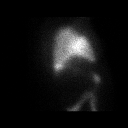

[Series 6: lpo/rao perf · 4.14mm/px · 1 of 1 slices shown (1 of 2)]
[im 1/1]
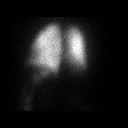

[Series 6: lpo/rao perf · 4.14mm/px · 1 of 1 slices shown (2 of 2)]
[im 1/1]
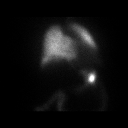

[Series 7: ant/post perf · 4.14mm/px · 1 of 1 slices shown (1 of 2)]
[im 1/1]
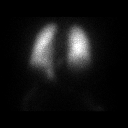

[Series 7: ant/post perf · 4.14mm/px · 1 of 1 slices shown (2 of 2)]
[im 1/1]
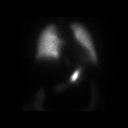

[Series 8: lao/rpo perf · 4.14mm/px · 1 of 1 slices shown (1 of 2)]
[im 1/1]
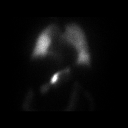

[Series 8: lao/rpo perf · 4.14mm/px · 1 of 1 slices shown (2 of 2)]
[im 1/1]
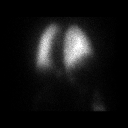

[16 of 16 positions shown; findings below may reference images not displayed]

FINDINGS: Ventilation: Radiotracer uptake is homogeneous and symmetric
bilaterally. There is no appreciable ventilation defect.

Perfusion: Radiotracer uptake is homogeneous and symmetric
bilaterally. There is no appreciable perfusion defect.
IMPRESSION: No appreciable ventilation or perfusion defects. This study
constitutes a very low probability of pulmonary embolus.

## 2017-05-29 DIAGNOSIS — M25512 Pain in left shoulder: Secondary | ICD-10-CM | POA: Diagnosis not present

## 2017-05-29 DIAGNOSIS — M25612 Stiffness of left shoulder, not elsewhere classified: Secondary | ICD-10-CM | POA: Diagnosis not present

## 2017-05-29 DIAGNOSIS — M6281 Muscle weakness (generalized): Secondary | ICD-10-CM | POA: Diagnosis not present

## 2017-06-08 DIAGNOSIS — M25512 Pain in left shoulder: Secondary | ICD-10-CM | POA: Diagnosis not present

## 2017-06-08 DIAGNOSIS — M25612 Stiffness of left shoulder, not elsewhere classified: Secondary | ICD-10-CM | POA: Diagnosis not present

## 2017-06-08 DIAGNOSIS — M6281 Muscle weakness (generalized): Secondary | ICD-10-CM | POA: Diagnosis not present

## 2017-06-10 ENCOUNTER — Encounter: Payer: 59 | Admitting: Physical Therapy

## 2017-06-12 ENCOUNTER — Encounter: Payer: 59 | Admitting: Physical Therapy

## 2017-06-14 ENCOUNTER — Emergency Department (HOSPITAL_COMMUNITY): Payer: 59

## 2017-06-14 ENCOUNTER — Encounter (HOSPITAL_COMMUNITY): Payer: Self-pay | Admitting: *Deleted

## 2017-06-14 ENCOUNTER — Emergency Department (HOSPITAL_COMMUNITY)
Admission: EM | Admit: 2017-06-14 | Discharge: 2017-06-14 | Disposition: A | Discharge status: Home / Self Care | Payer: 59 | Attending: Emergency Medicine | Admitting: Emergency Medicine

## 2017-06-14 DIAGNOSIS — I110 Hypertensive heart disease with heart failure: Secondary | ICD-10-CM | POA: Diagnosis not present

## 2017-06-14 DIAGNOSIS — Z7984 Long term (current) use of oral hypoglycemic drugs: Secondary | ICD-10-CM | POA: Diagnosis not present

## 2017-06-14 DIAGNOSIS — I5032 Chronic diastolic (congestive) heart failure: Secondary | ICD-10-CM | POA: Diagnosis not present

## 2017-06-14 DIAGNOSIS — L89302 Pressure ulcer of unspecified buttock, stage 2: Secondary | ICD-10-CM | POA: Insufficient documentation

## 2017-06-14 DIAGNOSIS — I252 Old myocardial infarction: Secondary | ICD-10-CM | POA: Insufficient documentation

## 2017-06-14 DIAGNOSIS — E119 Type 2 diabetes mellitus without complications: Secondary | ICD-10-CM | POA: Diagnosis not present

## 2017-06-14 DIAGNOSIS — L89322 Pressure ulcer of left buttock, stage 2: Secondary | ICD-10-CM | POA: Diagnosis not present

## 2017-06-14 DIAGNOSIS — M25552 Pain in left hip: Secondary | ICD-10-CM | POA: Insufficient documentation

## 2017-06-14 DIAGNOSIS — M545 Low back pain: Secondary | ICD-10-CM | POA: Diagnosis not present

## 2017-06-14 DIAGNOSIS — Z79899 Other long term (current) drug therapy: Secondary | ICD-10-CM | POA: Diagnosis not present

## 2017-06-14 DIAGNOSIS — L89312 Pressure ulcer of right buttock, stage 2: Secondary | ICD-10-CM | POA: Diagnosis not present

## 2017-06-14 DIAGNOSIS — J45909 Unspecified asthma, uncomplicated: Secondary | ICD-10-CM | POA: Insufficient documentation

## 2017-06-14 LAB — CBG MONITORING, ED: Glucose-Capillary: 119 mg/dL — ABNORMAL HIGH (ref 65–99)

## 2017-06-14 MED ORDER — ACETAMINOPHEN 325 MG PO TABS
650.0000 mg | ORAL_TABLET | Freq: Once | ORAL | Status: AC
  Administered 2017-06-14: 650 mg via ORAL
  Filled 2017-06-14: qty 2

## 2017-06-14 MED ORDER — ZINC OXIDE 13 % EX CREA: 1.0000 "application " | TOPICAL_CREAM | Freq: Two times a day (BID) | CUTANEOUS | 0 refills | Status: DC
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
## 2017-06-14 NOTE — Discharge Instructions (Signed)
Please see the information and instructions below regarding your visit.  Your diagnoses today include:  1. Left hip pain   2. Pressure injury of buttock, stage 2, unspecified laterality    Your exam was reassuring today that the source of your pain is not affecting the spinal cord and nerves that originate in the spinal cord.   If you have a history of disc herniation or arthritis in your spine, the nerves exiting the spine on one side get inflamed. This can cause severe pain. We call this radiculopathy. We do not always know what causes the sudden inflammation.  Tests performed today include: See side panel of your discharge paperwork for testing performed today. Vital signs are listed at the bottom of these instructions.   There were no broken bones identified on your x-rays.  Medications prescribed:    Take any prescribed medications only as prescribed, and any over the counter medications only as directed on the packaging.  Please use the Salon PAS 12 hour or Salon PAS gel patches, which do not contain lidocaine. You may ask a pharmacist regarding these treatments.  Please use Tylenol, [phone] mg every 6 hours.  Do not exceed 4 g in 1 day.  Please use Desitin ointment twice daily over the ulcers on your buttock. Please keep them covered with the dressings we give you.  Home care instructions:   Please keep moving and walking as tolerated. There are exercises included in this packet to perform as tolerated for your low back pain.   Apply heat to the areas that are painful. Avoid twisting or bending your trunk to lift something. Do not lift anything above 25 lbs while recovering from this flare of low back pain.  Please follow any educational materials contained in this packet.   Follow-up instructions: Please follow-up with your primary care provider as soon as possible for further evaluation of your symptoms if they are not completely improved.   Please follow up with Dr. Erlinda Hong  as needed for your regularly scheduled appointment.  Return instructions:  Please return to the Emergency Department if you experience worsening symptoms.  Please return for any fever or chills in the setting of your back pain, weakness in the muscles of the legs, numbness in your legs and feet that is new or changing, numbness in the area where you wipe, retention of your urine, loss of bowel or bladder control, or problems with walking. Please return for any fevers, chills, redness or swelling to your left hip. Please return if you have any other emergent concerns.  Additional Information:   Your vital signs today were: BP (!) 166/86 (BP Location: Left Arm)    Pulse 83    Temp 98.2 F (36.8 C) (Oral)    Resp 18    SpO2 98%  If your blood pressure (BP) was elevated on multiple readings during this visit above 130 for the top number or above 80 for the bottom number, please have this repeated by your primary care provider within one month. --------------  Thank you for allowing [place] to participate in your care today.
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
## 2017-06-16 ENCOUNTER — Encounter: Payer: 59 | Admitting: Physical Therapy

## 2017-06-18 ENCOUNTER — Emergency Department (HOSPITAL_COMMUNITY)
Admission: EM | Admit: 2017-06-18 | Discharge: 2017-06-18 | Disposition: A | Discharge status: Home / Self Care | Payer: 59 | Attending: Emergency Medicine | Admitting: Emergency Medicine

## 2017-06-18 ENCOUNTER — Encounter: Payer: 59 | Admitting: Physical Therapy

## 2017-06-18 DIAGNOSIS — Z7984 Long term (current) use of oral hypoglycemic drugs: Secondary | ICD-10-CM | POA: Diagnosis not present

## 2017-06-18 DIAGNOSIS — M25552 Pain in left hip: Secondary | ICD-10-CM | POA: Diagnosis present

## 2017-06-18 DIAGNOSIS — Z91040 Latex allergy status: Secondary | ICD-10-CM | POA: Diagnosis not present

## 2017-06-18 DIAGNOSIS — I110 Hypertensive heart disease with heart failure: Secondary | ICD-10-CM | POA: Diagnosis not present

## 2017-06-18 DIAGNOSIS — I5032 Chronic diastolic (congestive) heart failure: Secondary | ICD-10-CM | POA: Diagnosis not present

## 2017-06-18 DIAGNOSIS — M545 Low back pain: Secondary | ICD-10-CM | POA: Diagnosis not present

## 2017-06-18 DIAGNOSIS — Z79899 Other long term (current) drug therapy: Secondary | ICD-10-CM | POA: Insufficient documentation

## 2017-06-18 DIAGNOSIS — M5416 Radiculopathy, lumbar region: Secondary | ICD-10-CM | POA: Insufficient documentation

## 2017-06-18 DIAGNOSIS — E119 Type 2 diabetes mellitus without complications: Secondary | ICD-10-CM | POA: Diagnosis not present

## 2017-06-18 DIAGNOSIS — J45909 Unspecified asthma, uncomplicated: Secondary | ICD-10-CM | POA: Diagnosis not present

## 2017-06-18 MED ORDER — ACETAMINOPHEN 325 MG PO TABS: 650.0000 mg | ORAL_TABLET | Freq: Four times a day (QID) | ORAL | 0 refills | Status: DC | PRN

## 2017-06-18 MED ORDER — OXYCODONE-ACETAMINOPHEN 5-325 MG PO TABS: 1.0000 | ORAL_TABLET | Freq: Three times a day (TID) | ORAL | 0 refills | Status: DC | PRN

## 2017-06-18 NOTE — Discharge Instructions (Signed)
A consult was placed to a case manager who will contact you in regards to helping you ambulate safely in your home.  You were give a prescription for tylenol for your symptoms. You may take [phone] mg of Tylenol every 6 hours as needed for pain control. Do not exceed 4000 mg of Tylenol daily as this can lead to liver damage. You may use warm and cold compresses to help with your symptoms.   Please keep your orthopedic appointment on 06/21/17 with Dr. Erlinda Hong. Please also follow up with your primary doctor within the next 7-10 days for re-evaluation and further treatment of your symptoms.   Please return to the ER sooner if you have any new or worsening symptoms including and loss of control of your bowels or bladder, urinary retention, numbness to your leg, or any new or worsening symptoms.

## 2017-06-18 NOTE — ED Notes (Signed)
Pt has constantly been moaning out with pain since paced in triage room, pt was reassured that she would see a doctor when there was a room available

## 2017-06-18 NOTE — ED Notes (Signed)
Patient reports that her brother can't come to get her and someone was supposed to be making arrangements home for her

## 2017-06-18 NOTE — ED Notes (Signed)
Pt was seen here Sunday for the same, was told to follow up with the orthopedic doctor and that appt is Monday. Pt called EMS because the pain is worse and she can't stand on it

## 2017-06-18 NOTE — ED Provider Notes (Signed)
[facility] DEPT Provider Note   CSN: [id number] Arrival date & time: 06/18/17  6734     History   Chief Complaint Chief Complaint  Patient presents with  . Hip Pain    HPI Amanda Butler is a 62 y.o. female.  HPI   Patient is a 62 year old female with history of arthritis, CHF, HTN, HLD, T2DM, who presents to the ED c/o 15/10 excruciating posterior left hip/buttock pain pain the began earlier this week, but worsened around 1pm last night. States she has had decreased ability to walk due to pain and endorses subjective weakness to the LLE. states she has been able to walk with cane at home prior to arrival. Reports RLE paresthesias as well that occur intermittently, and are worse with prolonged sitting. Pain improves with walking and worsens when sitting for prolonged periods.  Was seen in ED on 06/14/17 with same pain and had xray of left hip and lumbar spine which showed arthritis of both hip joints, and multilevel degenerative disc disease with, severe at L5-S1. Has tried taking Tylenol with mild relief.  denies numbness to BLE. No loss of control of bowels or bladder. Denies urinary retention. Denies saddle anesthesia. No h/o IVDU. No fevers. No h/o CA.   Had fall in 03/2017, but no falls recently and no trauma. Pt concerned that she is going to fall at home. She has no stairs at home.  Pt states that she has an appointment with Dr. Erlinda Hong on 06/22/17 for follow up.  Past Medical History:  Diagnosis Date  . Arthritis    "knees" (09/13/2015)  . Asthma   . Chronic diastolic CHF (congestive heart failure) ([place])    a. 09/2015 Echo: EF 55-60%, mild LVH, gr2DD, no rwma, mild MR, mildly dil LA.   Marland Kitchen GERD (gastroesophageal reflux disease)   . High cholesterol   . Hypertension   . Midsternal chest pain    a. 09/2015 MV: no ischemia/infarct, EF 61%; b. 09/2015 Low prob V:Q scan.  . Morbid obesity ([place])   . Obesity   . Pneumonia ~ 2000   "mild case"  . RBBB (right  bundle branch block) approx 2010   Dr Marisue Humble did ECG and told her  . Type II diabetes mellitus [facility])     Patient Active Problem List   Diagnosis Date Noted  . Closed nondisplaced fracture of greater tuberosity of left humerus 04/23/2017  . Hypertension   . Type II diabetes mellitus ([place])   . High cholesterol   . Morbid obesity ([place])   . Chronic diastolic CHF (congestive heart failure) ([place])   . Midsternal chest pain   . Pain in the chest   . Essential hypertension   . Morbid obesity due to excess calories ([place])   . Chest pain 09/13/2015  . Bilateral lower extremity edema 09/13/2015  . Diabetes mellitus type 2, diet-controlled ([place]) 09/13/2015  . Obesity 09/13/2015  . Total bilirubin, elevated 09/13/2015  . HTN (hypertension) 09/13/2015  . Stasis dermatitis of both legs 09/13/2015  . Left leg cellulitis 09/13/2015  . RBBB 09/13/2015  . Asthma 09/13/2015  . GERD (gastroesophageal reflux disease) 09/13/2015  . 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    Past Surgical History:  Procedure Laterality Date  . BUNIONECTOMY Left   . COLONOSCOPY WITH PROPOFOL N/A 09/23/2016   Procedure: COLONOSCOPY WITH PROPOFOL;  Surgeon: Garlan Fair, MD;  Location: [facility] ENDOSCOPY;  Service: Endoscopy;  Laterality: N/A;  . MULTIPLE TOOTH EXTRACTIONS  ~ 1975  .  TENDON RELEASE Right    Leg  . TUBAL LIGATION  ~ 1983    OB History    No data available       Home Medications    Prior to Admission medications   Medication Sig Start Date End Date Taking? Authorizing Provider  acetaminophen (TYLENOL) 325 MG tablet Take 2 tablets (650 mg total) by mouth every 6 (six) hours as needed. Do not take more than 4000mg  of tylenol per day 06/18/17   Salar Molden S, PA-C  albuterol (PROVENTIL HFA;VENTOLIN HFA) 108 (90 BASE) MCG/ACT inhaler Inhale 2 puffs into the lungs 2 (two) times daily as needed for wheezing or shortness of breath.     [provider]  albuterol (PROVENTIL) (2.5 MG/3ML) 0.083%  nebulizer solution Take 2.5 mg by nebulization every 6 (six) hours as needed for wheezing or shortness of breath.    [provider]  cephALEXin (KEFLEX) 500 MG capsule Take 1 capsule (500 mg total) by mouth 4 (four) times daily. 04/10/17   Lajean Saver, MD  clotrimazole (LOTRIMIN) 1 % cream Apply to affected area 2 times daily 04/10/17   Lajean Saver, MD  famotidine (PEPCID) 10 MG tablet Take 10 mg by mouth daily as needed for heartburn.     [provider]  JANUVIA 100 MG tablet Take 100 mg by mouth daily. 07/02/16   [provider]  lisinopril (PRINIVIL,ZESTRIL) 10 MG tablet Take 1 tablet (10 mg total) by mouth daily. 07/08/16   Croitoru, Mihai, MD  metoprolol succinate (TOPROL-XL) 50 MG 24 hr tablet Take 50 mg by mouth daily. Take with or immediately following a meal.    [provider]  mometasone (NASONEX) 50 MCG/ACT nasal spray Place 2 sprays into the nose daily.     [provider]  oxyCODONE-acetaminophen (PERCOCET/ROXICET) 5-325 MG tablet Take 1 tablet by mouth every 8 (eight) hours as needed for severe pain. Do not take while driving, working, or operating machinery 06/18/17   Maycol Hoying S, PA-C  pravastatin (PRAVACHOL) 20 MG tablet Take 20 mg by mouth daily.    [provider]  triamterene-hydrochlorothiazide (MAXZIDE) 75-50 MG per tablet Take 1 tablet by mouth daily.    [provider]  Zinc Oxide 13 % CREA Apply 1 application topically 2 (two) times daily. 06/14/17   Albesa Seen, PA-C    Family History Family History  Problem Relation Age of Onset  . Diabetes Mother   . Hypertension Mother   . Diabetes Father   . Hypertension Father   . Heart attack Father 32  . Diabetes Maternal Aunt   . Diabetes Maternal Grandmother   . Hypertension Maternal Grandmother   . Cancer Maternal Grandfather   . Diabetes Maternal Grandfather   . Hypertension Maternal Grandfather   . Asthma Paternal Grandmother   . Diabetes  Paternal Grandmother   . Hypertension Paternal Grandmother   . Diabetes Paternal Grandfather   . Hypertension Paternal Grandfather   . Heart failure Paternal Grandfather     Social History Social History   Tobacco Use  . Smoking status: Never Smoker  . Smokeless tobacco: Never Used  Substance Use Topics  . Alcohol use: No  . Drug use: No     Allergies   Bee venom; Shellfish allergy; Aspirin; Iodine; Latex; Metformin and related; Morphine and related; and Nsaids   Review of Systems Review of Systems  Constitutional: Negative for fever.  Respiratory: Negative for shortness of breath.   Cardiovascular: Negative for chest  pain.  Genitourinary:       No loss of control of bowels or bladder  Neurological: Positive for weakness. Negative for numbness.       No saddle anesthesia, paresthesias     Physical Exam Updated Vital Signs BP (!) 158/99 (BP Location: Right Arm)   Pulse 78   Temp 97.9 F (36.6 C) (Oral)   Resp 20   SpO2 98%   Physical Exam  Constitutional: She appears well-developed and well-nourished. No distress.  HENT:  Head: Normocephalic and atraumatic.  Eyes: Conjunctivae and EOM are normal. Pupils are equal, round, and reactive to light.  Neck: Normal range of motion. Neck supple.  Cardiovascular: Normal rate and regular rhythm.  No murmur heard. Pulmonary/Chest: Effort normal and breath sounds normal. No respiratory distress.  Abdominal: Soft. There is no tenderness.  Musculoskeletal: She exhibits no edema.  No midline ttp, ttp to left ischial tuberosity with reproduces pain, no ttp to si joints bilat  Neurological: She is alert.  Cranial nerves grossly intact by observation Motor:  Normal tone. 5/5 strength to RLE. 5/5 strength to LLE with hip adduction/abduction/extension, 1/5 strength with hip flexion with +Hoover's sign, 5/5 strength with bilat knee flexion/extension, dorsiflexion/plantarglexion Sensory: sensation intact with light touch and  painful stimuli to BLE. DTRs: patellar, achilles DTRs 2+ symmetric b/l, negative babinski bilat Gait: able to bear weight on both legs when standing, but refuses to take step forward CV: 2+ radial and DP/PT pulses   Skin: Skin is warm and dry. Capillary refill takes less than 2 seconds.  No ulcers or skin breakdown to bilat feet  Psychiatric:  anxious  Nursing note and vitals reviewed.    ED Treatments / Results  Labs (all labs ordered are listed, but only abnormal results are displayed) Labs Reviewed - No data to display  EKG  EKG Interpretation None       Radiology No results found.  Procedures Procedures (including critical care time)  Medications Ordered in ED Medications - No data to display   Initial Impression / Assessment and Plan / ED Course  I have reviewed the triage vital signs and the nursing notes.  Pertinent labs & imaging results that were available during my care of the patient were reviewed by me and considered in my medical decision making (see chart for details).   pt is requesting to be admitted to the hospital for her symptoms. Pt anxious.   Final Clinical Impressions(s) / ED Diagnoses   Final diagnoses:  Lumbar radicular pain   62 year old female with left hip pain for 1 week. Had x-rays of left hip and lumbar spine several days ago showing osteoarthritis and degenerative disc disease most notable at L5-S1.  Vital signs with mild hypertension, likely secondary to pain.  Doubt hypertensive emergency.  Remainder vital signs within normal limits.  Patient afebrile and nontoxic-appearing.  Appears anxious.  Patient complaining of subjective sensory changes and weakness to the left lower extremity, with concern about her ambulation.  Patient had no obvious focal/consistent neurologic focal deficit. Did have subjective decreased strength with left hip with flexion which is is suspected to be due to poor effort secondary to pain.  Positive Hoover's  sign.  Lower extremity reflexes intact and symmetric bilat as well as sensation to soft and painful stimuli.  Patient has follow-up with orthopedist in 4 days.  Gave symptomatic treatment until she can be seen by OrthO.  Placed consult for face-to-face encounter to ensure patient has safe ambulation at home  until she is seen by orthopedics.  Patient understands that she needs to follow-up on 06/22/17.  Discussed reasons to return to the emergency department, and she voices understanding of this.  All questions were answered and the patient is agreeable to the plan.   ED Discharge Orders        [place]     06/18/17 [phone]    Face-to-face encounter (required for Medicare/Medicaid patients)    Comments:  I Atkins certify that this patient is under my care and that I, or a nurse practitioner or physician's assistant working with me, had a face-to-face encounter that meets the physician face-to-face encounter requirements with this patient on 06/18/2017. The encounter with the patient was in whole, or in part for the following medical condition(s) which is the primary reason for home health care (List medical condition): safe ambulation   06/18/17 0709    acetaminophen (TYLENOL) 325 MG tablet  Every 6 hours PRN     06/18/17 0711    oxyCODONE-acetaminophen (PERCOCET/ROXICET) 5-325 MG tablet  Every 8 hours PRN     06/18/17 0805       Leotta Weingarten S, PA-C 06/18/17 1944    Rolland Porter, MD 06/18/17 2303

## 2017-06-18 NOTE — ED Notes (Signed)
PTAR made aware of transport home

## 2017-06-18 NOTE — ED Notes (Signed)
Bed: WTR7 Expected date:  Expected time:  Means of arrival:  Comments: 

## 2017-06-22 ENCOUNTER — Ambulatory Visit (INDEPENDENT_AMBULATORY_CARE_PROVIDER_SITE_OTHER): Payer: 59 | Admitting: Orthopaedic Surgery

## 2017-06-22 ENCOUNTER — Telehealth (INDEPENDENT_AMBULATORY_CARE_PROVIDER_SITE_OTHER): Payer: Self-pay

## 2017-06-22 DIAGNOSIS — G8929 Other chronic pain: Secondary | ICD-10-CM

## 2017-06-22 DIAGNOSIS — M5441 Lumbago with sciatica, right side: Secondary | ICD-10-CM | POA: Diagnosis not present

## 2017-06-22 MED ORDER — METHYLPREDNISOLONE 4 MG PO TBPK: ORAL_TABLET | ORAL | 0 refills | Status: DC

## 2017-06-22 MED ORDER — TRAMADOL HCL 50 MG PO TABS: 50.0000 mg | ORAL_TABLET | Freq: Four times a day (QID) | ORAL | 0 refills | Status: DC | PRN

## 2017-06-22 MED ORDER — METHOCARBAMOL 500 MG PO TABS: 500.0000 mg | ORAL_TABLET | Freq: Four times a day (QID) | ORAL | 0 refills | Status: DC

## 2017-06-22 NOTE — Progress Notes (Signed)
Office Visit Note   Patient: Amanda Butler           Date of Birth: 1955-11-04           MRN: [id number] Visit Date: 06/22/2017              Requested by: Gaynelle Arabian, MD [address] [place], [place] [address] PCP: Gaynelle Arabian, MD   Assessment & Plan: Visit Diagnoses:  1. Chronic right-sided low back pain with right-sided sciatica     Plan: Impression is left hip osteoarthritis versus lumbar radiculopathy.  This point I think Meara is experiencing an atypical presentation of hip arthritis.  We would like to refer her to Dr. Ernestina Patches for an intra-articular cortisone injection, however she says based on her heart condition she was told not to have any cortisone injections.  I tried to explain to her this is in the joint and should not be absorbed systemically.  She would like for [place] to contact her cardiologist to see if this is okay.  If we are able to proceed with this injection but she does not get any relief, we will look further into her back.  She will follow-up with [place] in 1 week's time for her shoulder.  Follow-Up Instructions: Return in about 1 year (around 06/23/2018) for for recheck of shoulder.   Orders:  No orders of the defined types were placed in this encounter.  Meds ordered this encounter  Medications  . methylPREDNISolone (MEDROL DOSEPAK) 4 MG TBPK tablet    Sig: Take as directed    Dispense:  21 tablet    Refill:  0      Procedures: No procedures performed   Clinical Data: No additional findings.   Subjective: Chief Complaint  Patient presents with  . Left Hip - Pain, Follow-up    HPI Amanda Butler is a 62 year old female who presents to our clinic today with left lower back pain and radiculopathy.  This began about a week ago without any known injury or change in activity.  She notes pain to the left buttocks.  No anterior thigh or groin pain.  This occasionally radiates down the left leg.  She notes that this is excruciating and is  limiting her ambulation.  She does note occasional radicular symptoms to the left foot.  No bowel or bladder incontinence.  No saddle paresthesias.  Of note, she was seen in the ED on 3/3 and then again on 06/18/17 where x-rays of the hip and back were obtained.  No fractures noted.  She did have severe arthritis to both hips.  She also has multilevel spinal stenosis worse at L5-S1.  No previous epidural steroid injections or surgical intervention.  Review of Systems as detailed in HPI.  All others are negative.   Objective: Vital Signs: There were no vitals taken for this visit.  Physical Exam well-developed well-nourished female in no acute distress.  Alert and oriented x3.  Ortho Exam examination of her left hip reveals positive logroll.  Markedly positive straight leg raise.  Full strength.  Specialty Comments:  No specialty comments available.  Imaging: Images reviewed by me in canopy.    PMFS History: Patient Active Problem List   Diagnosis Date Noted  . 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  . Closed nondisplaced fracture of greater tuberosity of left humerus 04/23/2017  . Hypertension   . Type II diabetes mellitus ([place])   . High cholesterol   . Morbid  obesity ([place])   . Chronic diastolic CHF (congestive heart failure) ([place])   . Midsternal chest pain   . Pain in the chest   . Essential hypertension   . Morbid obesity due to excess calories ([place])   . Chest pain 09/13/2015  . Bilateral lower extremity edema 09/13/2015  . Diabetes mellitus type 2, diet-controlled ([place]) 09/13/2015  . Obesity 09/13/2015  . Total bilirubin, elevated 09/13/2015  . HTN (hypertension) 09/13/2015  . Stasis dermatitis of both legs 09/13/2015  . Left leg cellulitis 09/13/2015  . RBBB 09/13/2015  . Asthma 09/13/2015  . GERD (gastroesophageal reflux disease) 09/13/2015  . 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   Past Medical History:  Diagnosis Date  . Arthritis     "knees" (09/13/2015)  . Asthma   . Chronic diastolic CHF (congestive heart failure) ([place])    a. 09/2015 Echo: EF 55-60%, mild LVH, gr2DD, no rwma, mild MR, mildly dil LA.   Marland Kitchen GERD (gastroesophageal reflux disease)   . High cholesterol   . Hypertension   . Midsternal chest pain    a. 09/2015 MV: no ischemia/infarct, EF 61%; b. 09/2015 Low prob V:Q scan.  . Morbid obesity ([place])   . Obesity   . Pneumonia ~ 2000   "mild case"  . RBBB (right bundle branch block) approx 2010   Dr Marisue Humble did ECG and told her  . Type II diabetes mellitus ([facility])     Family History  Problem Relation Age of Onset  . Diabetes Mother   . Hypertension Mother   . Diabetes Father   . Hypertension Father   . Heart attack Father 25  . Diabetes Maternal Aunt   . Diabetes Maternal Grandmother   . Hypertension Maternal Grandmother   . Cancer Maternal Grandfather   . Diabetes Maternal Grandfather   . Hypertension Maternal Grandfather   . Asthma Paternal Grandmother   . Diabetes Paternal Grandmother   . Hypertension Paternal Grandmother   . Diabetes Paternal Grandfather   . Hypertension Paternal Grandfather   . Heart failure Paternal Grandfather     Past Surgical History:  Procedure Laterality Date  . BUNIONECTOMY Left   . COLONOSCOPY WITH PROPOFOL N/A 09/23/2016   Procedure: COLONOSCOPY WITH PROPOFOL;  Surgeon: Garlan Fair, MD;  Location: [facility];  Service: Endoscopy;  Laterality: N/A;  . MULTIPLE TOOTH EXTRACTIONS  ~ 1975  . TENDON RELEASE Right    Leg  . TUBAL LIGATION  ~ 1983   Social History   Occupational History  . 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 Smoking status: Never Smoker  . Smokeless tobacco: Never Used  Substance and Sexual Activity  . Alcohol use: No  . Drug use: No  . Sexual activity: No

## 2017-06-22 NOTE — Telephone Encounter (Signed)
Hey this is Kathlee Nations from [employer]. Mendel Ryder,  Dr Phoebe Sharps PA would like to know if it is okay for patient to get a cortisone injection in her Left hip. See our office notes if needed from today's visit. Please advise. Thank you so much.

## 2017-06-23 ENCOUNTER — Encounter: Payer: 59 | Admitting: Physical Therapy

## 2017-06-24 ENCOUNTER — Encounter: Payer: Self-pay | Admitting: Cardiovascular Disease

## 2017-06-24 NOTE — Telephone Encounter (Signed)
See messages below. I placed letter on your desk for review.

## 2017-06-24 NOTE — Telephone Encounter (Signed)
Ok, thanks.

## 2017-06-24 NOTE — Telephone Encounter (Signed)
Yes, should be OK. See brief letter in epic.

## 2017-06-25 ENCOUNTER — Encounter: Payer: 59 | Admitting: Physical Therapy

## 2017-06-25 NOTE — Telephone Encounter (Signed)
Called patient to advise she states she had 2nd opinions from 2 different Cardiologists and they told her this cortisone  inj was not appropriate for her Amanda Butler of the medications she is on. She states she is doing fine without it and she will just stick to PT.

## 2017-06-30 ENCOUNTER — Ambulatory Visit (INDEPENDENT_AMBULATORY_CARE_PROVIDER_SITE_OTHER): Payer: 59 | Admitting: Orthopaedic Surgery

## 2017-07-03 DIAGNOSIS — E1129 Type 2 diabetes mellitus with other diabetic kidney complication: Secondary | ICD-10-CM | POA: Diagnosis not present

## 2017-07-03 DIAGNOSIS — E7800 Pure hypercholesterolemia, unspecified: Secondary | ICD-10-CM | POA: Diagnosis not present

## 2017-07-03 DIAGNOSIS — I10 Essential (primary) hypertension: Secondary | ICD-10-CM | POA: Diagnosis not present

## 2017-07-16 DIAGNOSIS — R262 Difficulty in walking, not elsewhere classified: Secondary | ICD-10-CM | POA: Diagnosis not present

## 2017-07-16 DIAGNOSIS — M25552 Pain in left hip: Secondary | ICD-10-CM | POA: Diagnosis not present

## 2017-07-16 DIAGNOSIS — M6281 Muscle weakness (generalized): Secondary | ICD-10-CM | POA: Diagnosis not present

## 2017-07-23 DIAGNOSIS — M25552 Pain in left hip: Secondary | ICD-10-CM | POA: Diagnosis not present

## 2017-07-23 DIAGNOSIS — M6281 Muscle weakness (generalized): Secondary | ICD-10-CM | POA: Diagnosis not present

## 2017-07-23 DIAGNOSIS — R262 Difficulty in walking, not elsewhere classified: Secondary | ICD-10-CM | POA: Diagnosis not present

## 2017-07-29 DIAGNOSIS — R262 Difficulty in walking, not elsewhere classified: Secondary | ICD-10-CM | POA: Diagnosis not present

## 2017-07-29 DIAGNOSIS — M25552 Pain in left hip: Secondary | ICD-10-CM | POA: Diagnosis not present

## 2017-07-29 DIAGNOSIS — M6281 Muscle weakness (generalized): Secondary | ICD-10-CM | POA: Diagnosis not present

## 2017-08-04 DIAGNOSIS — M6281 Muscle weakness (generalized): Secondary | ICD-10-CM | POA: Diagnosis not present

## 2017-08-04 DIAGNOSIS — M25552 Pain in left hip: Secondary | ICD-10-CM | POA: Diagnosis not present

## 2017-08-04 DIAGNOSIS — R262 Difficulty in walking, not elsewhere classified: Secondary | ICD-10-CM | POA: Diagnosis not present

## 2017-08-06 DIAGNOSIS — M6281 Muscle weakness (generalized): Secondary | ICD-10-CM | POA: Diagnosis not present

## 2017-08-06 DIAGNOSIS — R262 Difficulty in walking, not elsewhere classified: Secondary | ICD-10-CM | POA: Diagnosis not present

## 2017-08-06 DIAGNOSIS — M25552 Pain in left hip: Secondary | ICD-10-CM | POA: Diagnosis not present

## 2017-08-13 DIAGNOSIS — R262 Difficulty in walking, not elsewhere classified: Secondary | ICD-10-CM | POA: Diagnosis not present

## 2017-08-13 DIAGNOSIS — M6281 Muscle weakness (generalized): Secondary | ICD-10-CM | POA: Diagnosis not present

## 2017-08-13 DIAGNOSIS — M25552 Pain in left hip: Secondary | ICD-10-CM | POA: Diagnosis not present

## 2017-08-18 DIAGNOSIS — R262 Difficulty in walking, not elsewhere classified: Secondary | ICD-10-CM | POA: Diagnosis not present

## 2017-08-18 DIAGNOSIS — M25552 Pain in left hip: Secondary | ICD-10-CM | POA: Diagnosis not present

## 2017-08-18 DIAGNOSIS — M6281 Muscle weakness (generalized): Secondary | ICD-10-CM | POA: Diagnosis not present

## 2017-08-20 DIAGNOSIS — M6281 Muscle weakness (generalized): Secondary | ICD-10-CM | POA: Diagnosis not present

## 2017-08-20 DIAGNOSIS — M25552 Pain in left hip: Secondary | ICD-10-CM | POA: Diagnosis not present

## 2017-08-20 DIAGNOSIS — R262 Difficulty in walking, not elsewhere classified: Secondary | ICD-10-CM | POA: Diagnosis not present

## 2017-08-24 DIAGNOSIS — L0390 Cellulitis, unspecified: Secondary | ICD-10-CM | POA: Diagnosis not present

## 2017-08-24 DIAGNOSIS — L8990 Pressure ulcer of unspecified site, unspecified stage: Secondary | ICD-10-CM | POA: Diagnosis not present

## 2017-08-27 DIAGNOSIS — M6281 Muscle weakness (generalized): Secondary | ICD-10-CM | POA: Diagnosis not present

## 2017-08-27 DIAGNOSIS — R262 Difficulty in walking, not elsewhere classified: Secondary | ICD-10-CM | POA: Diagnosis not present

## 2017-08-27 DIAGNOSIS — M25552 Pain in left hip: Secondary | ICD-10-CM | POA: Diagnosis not present

## 2017-09-03 DIAGNOSIS — M25552 Pain in left hip: Secondary | ICD-10-CM | POA: Diagnosis not present

## 2017-09-03 DIAGNOSIS — M6281 Muscle weakness (generalized): Secondary | ICD-10-CM | POA: Diagnosis not present

## 2017-09-03 DIAGNOSIS — R262 Difficulty in walking, not elsewhere classified: Secondary | ICD-10-CM | POA: Diagnosis not present

## 2017-09-08 DIAGNOSIS — M6281 Muscle weakness (generalized): Secondary | ICD-10-CM | POA: Diagnosis not present

## 2017-09-08 DIAGNOSIS — R262 Difficulty in walking, not elsewhere classified: Secondary | ICD-10-CM | POA: Diagnosis not present

## 2017-09-08 DIAGNOSIS — M25552 Pain in left hip: Secondary | ICD-10-CM | POA: Diagnosis not present

## 2017-09-09 ENCOUNTER — Other Ambulatory Visit (HOSPITAL_COMMUNITY)
Admission: RE | Admit: 2017-09-09 | Discharge: 2017-09-09 | Disposition: A | Discharge status: Home / Self Care | Payer: 59 | Source: Other Acute Inpatient Hospital | Attending: Physician Assistant | Admitting: Physician Assistant

## 2017-09-09 ENCOUNTER — Encounter (HOSPITAL_BASED_OUTPATIENT_CLINIC_OR_DEPARTMENT_OTHER): Payer: 59 | Attending: Internal Medicine

## 2017-09-09 DIAGNOSIS — L89312 Pressure ulcer of right buttock, stage 2: Secondary | ICD-10-CM | POA: Diagnosis not present

## 2017-09-09 DIAGNOSIS — I5020 Unspecified systolic (congestive) heart failure: Secondary | ICD-10-CM | POA: Diagnosis not present

## 2017-09-09 DIAGNOSIS — L89313 Pressure ulcer of right buttock, stage 3: Secondary | ICD-10-CM | POA: Diagnosis not present

## 2017-09-09 DIAGNOSIS — E11622 Type 2 diabetes mellitus with other skin ulcer: Secondary | ICD-10-CM | POA: Insufficient documentation

## 2017-09-09 DIAGNOSIS — L03116 Cellulitis of left lower limb: Secondary | ICD-10-CM | POA: Diagnosis not present

## 2017-09-09 DIAGNOSIS — L97821 Non-pressure chronic ulcer of other part of left lower leg limited to breakdown of skin: Secondary | ICD-10-CM | POA: Insufficient documentation

## 2017-09-09 DIAGNOSIS — I110 Hypertensive heart disease with heart failure: Secondary | ICD-10-CM | POA: Insufficient documentation

## 2017-09-12 LAB — AEROBIC CULTURE W GRAM STAIN (SUPERFICIAL SPECIMEN): Gram Stain: NONE SEEN

## 2017-09-15 DIAGNOSIS — R262 Difficulty in walking, not elsewhere classified: Secondary | ICD-10-CM | POA: Diagnosis not present

## 2017-09-15 DIAGNOSIS — M6281 Muscle weakness (generalized): Secondary | ICD-10-CM | POA: Diagnosis not present

## 2017-09-15 DIAGNOSIS — M25552 Pain in left hip: Secondary | ICD-10-CM | POA: Diagnosis not present

## 2017-09-16 ENCOUNTER — Encounter (HOSPITAL_BASED_OUTPATIENT_CLINIC_OR_DEPARTMENT_OTHER): Payer: 59 | Attending: Physician Assistant

## 2017-09-16 DIAGNOSIS — Z7984 Long term (current) use of oral hypoglycemic drugs: Secondary | ICD-10-CM | POA: Insufficient documentation

## 2017-09-16 DIAGNOSIS — I252 Old myocardial infarction: Secondary | ICD-10-CM | POA: Insufficient documentation

## 2017-09-16 DIAGNOSIS — L97821 Non-pressure chronic ulcer of other part of left lower leg limited to breakdown of skin: Secondary | ICD-10-CM | POA: Insufficient documentation

## 2017-09-16 DIAGNOSIS — L03116 Cellulitis of left lower limb: Secondary | ICD-10-CM | POA: Insufficient documentation

## 2017-09-16 DIAGNOSIS — B9689 Other specified bacterial agents as the cause of diseases classified elsewhere: Secondary | ICD-10-CM | POA: Insufficient documentation

## 2017-09-16 DIAGNOSIS — E11622 Type 2 diabetes mellitus with other skin ulcer: Secondary | ICD-10-CM | POA: Insufficient documentation

## 2017-09-16 DIAGNOSIS — I5020 Unspecified systolic (congestive) heart failure: Secondary | ICD-10-CM | POA: Insufficient documentation

## 2017-09-16 DIAGNOSIS — I110 Hypertensive heart disease with heart failure: Secondary | ICD-10-CM | POA: Insufficient documentation

## 2017-09-22 DIAGNOSIS — R262 Difficulty in walking, not elsewhere classified: Secondary | ICD-10-CM | POA: Diagnosis not present

## 2017-09-22 DIAGNOSIS — M6281 Muscle weakness (generalized): Secondary | ICD-10-CM | POA: Diagnosis not present

## 2017-09-22 DIAGNOSIS — M25552 Pain in left hip: Secondary | ICD-10-CM | POA: Diagnosis not present

## 2017-09-23 DIAGNOSIS — I5020 Unspecified systolic (congestive) heart failure: Secondary | ICD-10-CM | POA: Diagnosis not present

## 2017-09-23 DIAGNOSIS — I110 Hypertensive heart disease with heart failure: Secondary | ICD-10-CM | POA: Diagnosis not present

## 2017-09-23 DIAGNOSIS — Z7984 Long term (current) use of oral hypoglycemic drugs: Secondary | ICD-10-CM | POA: Diagnosis not present

## 2017-09-23 DIAGNOSIS — E11622 Type 2 diabetes mellitus with other skin ulcer: Secondary | ICD-10-CM | POA: Diagnosis not present

## 2017-09-23 DIAGNOSIS — I252 Old myocardial infarction: Secondary | ICD-10-CM | POA: Diagnosis not present

## 2017-09-23 DIAGNOSIS — B9689 Other specified bacterial agents as the cause of diseases classified elsewhere: Secondary | ICD-10-CM | POA: Diagnosis not present

## 2017-09-23 DIAGNOSIS — L03116 Cellulitis of left lower limb: Secondary | ICD-10-CM | POA: Diagnosis not present

## 2017-09-23 DIAGNOSIS — L89319 Pressure ulcer of right buttock, unspecified stage: Secondary | ICD-10-CM | POA: Diagnosis not present

## 2017-09-23 DIAGNOSIS — L97821 Non-pressure chronic ulcer of other part of left lower leg limited to breakdown of skin: Secondary | ICD-10-CM | POA: Diagnosis not present

## 2017-09-30 DIAGNOSIS — B9689 Other specified bacterial agents as the cause of diseases classified elsewhere: Secondary | ICD-10-CM | POA: Diagnosis not present

## 2017-09-30 DIAGNOSIS — L03116 Cellulitis of left lower limb: Secondary | ICD-10-CM | POA: Diagnosis not present

## 2017-09-30 DIAGNOSIS — E11622 Type 2 diabetes mellitus with other skin ulcer: Secondary | ICD-10-CM | POA: Diagnosis not present

## 2017-10-02 ENCOUNTER — Encounter (HOSPITAL_COMMUNITY): Payer: Self-pay | Admitting: Internal Medicine

## 2017-10-02 ENCOUNTER — Other Ambulatory Visit: Payer: Self-pay

## 2017-10-02 ENCOUNTER — Inpatient Hospital Stay (HOSPITAL_COMMUNITY)
Admission: EM | Admit: 2017-10-02 | Discharge: 2017-10-04 | DRG: 603 | Disposition: A | Discharge status: Home / Self Care | Payer: 59 | Attending: Family Medicine | Admitting: Family Medicine

## 2017-10-02 DIAGNOSIS — I10 Essential (primary) hypertension: Secondary | ICD-10-CM

## 2017-10-02 DIAGNOSIS — Z885 Allergy status to narcotic agent status: Secondary | ICD-10-CM

## 2017-10-02 DIAGNOSIS — M170 Bilateral primary osteoarthritis of knee: Secondary | ICD-10-CM | POA: Diagnosis present

## 2017-10-02 DIAGNOSIS — L03116 Cellulitis of left lower limb: Secondary | ICD-10-CM | POA: Diagnosis present

## 2017-10-02 DIAGNOSIS — E119 Type 2 diabetes mellitus without complications: Secondary | ICD-10-CM

## 2017-10-02 DIAGNOSIS — K219 Gastro-esophageal reflux disease without esophagitis: Secondary | ICD-10-CM | POA: Diagnosis present

## 2017-10-02 DIAGNOSIS — Z6841 Body Mass Index (BMI) 40.0 and over, adult: Secondary | ICD-10-CM

## 2017-10-02 DIAGNOSIS — Z79899 Other long term (current) drug therapy: Secondary | ICD-10-CM

## 2017-10-02 DIAGNOSIS — Z888 Allergy status to other drugs, medicaments and biological substances status: Secondary | ICD-10-CM

## 2017-10-02 DIAGNOSIS — L0390 Cellulitis, unspecified: Secondary | ICD-10-CM | POA: Diagnosis present

## 2017-10-02 DIAGNOSIS — I5032 Chronic diastolic (congestive) heart failure: Secondary | ICD-10-CM

## 2017-10-02 DIAGNOSIS — Z7951 Long term (current) use of inhaled steroids: Secondary | ICD-10-CM

## 2017-10-02 DIAGNOSIS — Z886 Allergy status to analgesic agent status: Secondary | ICD-10-CM

## 2017-10-02 DIAGNOSIS — Z833 Family history of diabetes mellitus: Secondary | ICD-10-CM

## 2017-10-02 DIAGNOSIS — Z91040 Latex allergy status: Secondary | ICD-10-CM

## 2017-10-02 DIAGNOSIS — E1140 Type 2 diabetes mellitus with diabetic neuropathy, unspecified: Secondary | ICD-10-CM | POA: Diagnosis present

## 2017-10-02 DIAGNOSIS — I110 Hypertensive heart disease with heart failure: Secondary | ICD-10-CM | POA: Diagnosis present

## 2017-10-02 DIAGNOSIS — J45909 Unspecified asthma, uncomplicated: Secondary | ICD-10-CM | POA: Diagnosis present

## 2017-10-02 DIAGNOSIS — E7800 Pure hypercholesterolemia, unspecified: Secondary | ICD-10-CM | POA: Diagnosis present

## 2017-10-02 DIAGNOSIS — Z91013 Allergy to seafood: Secondary | ICD-10-CM

## 2017-10-02 DIAGNOSIS — Z91030 Bee allergy status: Secondary | ICD-10-CM

## 2017-10-02 HISTORY — DX: Cellulitis, unspecified: L03.90

## 2017-10-02 LAB — CBC WITH DIFFERENTIAL/PLATELET
Abs Immature Granulocytes: 0 10*3/uL (ref 0.0–0.1)
Basophils Absolute: 0.1 10*3/uL (ref 0.0–0.1)
Basophils Relative: 1 %
Eosinophils Absolute: 0.2 10*3/uL (ref 0.0–0.7)
Eosinophils Relative: 2 %
HCT: 45.3 % (ref 36.0–46.0)
Hemoglobin: 14.8 g/dL (ref 12.0–15.0)
Immature Granulocytes: 0 %
Lymphocytes Relative: 23 %
Lymphs Abs: 1.7 10*3/uL (ref 0.7–4.0)
MCH: 29 pg (ref 26.0–34.0)
MCHC: 32.7 g/dL (ref 30.0–36.0)
MCV: 88.8 fL (ref 78.0–100.0)
Monocytes Absolute: 0.4 10*3/uL (ref 0.1–1.0)
Monocytes Relative: 5 %
Neutro Abs: 5.2 10*3/uL (ref 1.7–7.7)
Neutrophils Relative %: 69 %
Platelets: 205 10*3/uL (ref 150–400)
RBC: 5.1 MIL/uL (ref 3.87–5.11)
RDW: 13.2 % (ref 11.5–15.5)
WBC: 7.5 10*3/uL (ref 4.0–10.5)

## 2017-10-02 LAB — COMPREHENSIVE METABOLIC PANEL
ALT: 17 U/L (ref 14–54)
AST: 18 U/L (ref 15–41)
Albumin: 3.6 g/dL (ref 3.5–5.0)
Alkaline Phosphatase: 73 U/L (ref 38–126)
Anion gap: 8 (ref 5–15)
BUN: 7 mg/dL (ref 6–20)
CO2: 26 mmol/L (ref 22–32)
Calcium: 9.2 mg/dL (ref 8.9–10.3)
Chloride: 106 mmol/L (ref 101–111)
Creatinine, Ser: 0.97 mg/dL (ref 0.44–1.00)
GFR calc Af Amer: >60 mL/min (ref >60)
GFR calc non Af Amer: >60 mL/min (ref >60)
Glucose, Bld: 192 mg/dL — ABNORMAL HIGH (ref 65–99)
Potassium: 3.9 mmol/L (ref 3.5–5.1)
Sodium: 140 mmol/L (ref 135–145)
Total Bilirubin: 1.2 mg/dL (ref 0.3–1.2)
Total Protein: 7.6 g/dL (ref 6.5–8.1)

## 2017-10-02 LAB — GLUCOSE, CAPILLARY
Glucose-Capillary: 187 mg/dL — ABNORMAL HIGH (ref 65–99)
Glucose-Capillary: 174 mg/dL — ABNORMAL HIGH (ref 65–99)
Glucose-Capillary: 187 mg/dL — ABNORMAL HIGH (ref 65–99)

## 2017-10-02 LAB — HEMOGLOBIN A1C
Hgb A1c MFr Bld: 7.1 % — ABNORMAL HIGH (ref 4.8–5.6)
Mean Plasma Glucose: 157.07 mg/dL

## 2017-10-02 LAB — I-STAT CG4 LACTIC ACID, ED: Lactic Acid, Venous: 1.54 mmol/L (ref 0.5–1.9)

## 2017-10-02 MED ORDER — SODIUM CHLORIDE 0.9 % IV SOLN
1.0000 g | Freq: Once | INTRAVENOUS | Status: AC
  Administered 2017-10-02: 1 g via INTRAVENOUS
  Filled 2017-10-02: qty 10

## 2017-10-02 MED ORDER — INSULIN ASPART 100 UNIT/ML ~~LOC~~ SOLN
0.0000 [IU] | Freq: Three times a day (TID) | SUBCUTANEOUS | Status: DC
  Administered 2017-10-02 – 2017-10-03 (×3): 2 [IU] via SUBCUTANEOUS
  Administered 2017-10-03: 1 [IU] via SUBCUTANEOUS
  Administered 2017-10-03: 2 [IU] via SUBCUTANEOUS
  Administered 2017-10-04: 1 [IU] via SUBCUTANEOUS

## 2017-10-02 MED ORDER — TRAMADOL HCL 50 MG PO TABS: 50.0000 mg | ORAL_TABLET | Freq: Four times a day (QID) | ORAL | Status: DC | PRN

## 2017-10-02 MED ORDER — NYSTATIN 100000 UNIT/GM EX POWD
Freq: Three times a day (TID) | CUTANEOUS | Status: DC
Start: 2017-10-02 — End: 2017-10-04
  Administered 2017-10-02 – 2017-10-04 (×6): via TOPICAL
  Filled 2017-10-02 (×2): qty 15

## 2017-10-02 MED ORDER — FAMOTIDINE 20 MG PO TABS
10.0000 mg | ORAL_TABLET | Freq: Every day | ORAL | Status: DC | PRN
Start: 2017-10-02 — End: 2017-10-04

## 2017-10-02 MED ORDER — ONDANSETRON HCL 4 MG PO TABS: 4.0000 mg | ORAL_TABLET | Freq: Four times a day (QID) | ORAL | Status: DC | PRN

## 2017-10-02 MED ORDER — PRAVASTATIN SODIUM 40 MG PO TABS
20.0000 mg | ORAL_TABLET | Freq: Every day | ORAL | Status: DC
  Administered 2017-10-02 – 2017-10-04 (×3): 20 mg via ORAL
  Filled 2017-10-02 (×3): qty 1

## 2017-10-02 MED ORDER — OXYCODONE-ACETAMINOPHEN 5-325 MG PO TABS: 1.0000 | ORAL_TABLET | Freq: Three times a day (TID) | ORAL | Status: DC | PRN

## 2017-10-02 MED ORDER — METHOCARBAMOL 500 MG PO TABS
500.0000 mg | ORAL_TABLET | Freq: Four times a day (QID) | ORAL | Status: DC
  Filled 2017-10-02 (×4): qty 1

## 2017-10-02 MED ORDER — FLUTICASONE PROPIONATE 50 MCG/ACT NA SUSP
1.0000 | Freq: Every day | NASAL | Status: DC
  Administered 2017-10-02: 1 via NASAL
  Filled 2017-10-02: qty 16

## 2017-10-02 MED ORDER — ENOXAPARIN SODIUM 60 MG/0.6ML ~~LOC~~ SOLN
55.0000 mg | SUBCUTANEOUS | Status: DC
  Administered 2017-10-02 – 2017-10-04 (×3): 55 mg via SUBCUTANEOUS
  Filled 2017-10-02 (×3): qty 0.6

## 2017-10-02 MED ORDER — ONDANSETRON HCL 4 MG/2ML IJ SOLN: 4.0000 mg | Freq: Four times a day (QID) | INTRAMUSCULAR | Status: DC | PRN

## 2017-10-02 MED ORDER — SODIUM CHLORIDE 0.9 % IV SOLN
INTRAVENOUS | Status: DC | PRN
  Administered 2017-10-03: 250 mL via INTRAVENOUS

## 2017-10-02 MED ORDER — SODIUM CHLORIDE 0.9 % IV SOLN
1.0000 g | INTRAVENOUS | Status: DC
  Administered 2017-10-03 – 2017-10-04 (×2): 1 g via INTRAVENOUS
  Filled 2017-10-02 (×2): qty 10

## 2017-10-02 MED ORDER — ACETAMINOPHEN 325 MG PO TABS: 650.0000 mg | ORAL_TABLET | Freq: Four times a day (QID) | ORAL | Status: DC | PRN

## 2017-10-02 MED ORDER — LISINOPRIL 10 MG PO TABS
10.0000 mg | ORAL_TABLET | Freq: Every day | ORAL | Status: DC
  Administered 2017-10-02 – 2017-10-04 (×3): 10 mg via ORAL
  Filled 2017-10-02 (×3): qty 1

## 2017-10-02 MED ORDER — INSULIN ASPART 100 UNIT/ML ~~LOC~~ SOLN: 0.0000 [IU] | Freq: Every day | SUBCUTANEOUS | Status: DC

## 2017-10-02 MED ORDER — ALBUTEROL SULFATE HFA 108 (90 BASE) MCG/ACT IN AERS: 2.0000 | INHALATION_SPRAY | Freq: Two times a day (BID) | RESPIRATORY_TRACT | Status: DC | PRN

## 2017-10-02 MED ORDER — TRIAMTERENE-HCTZ 75-50 MG PO TABS
1.0000 | ORAL_TABLET | Freq: Every day | ORAL | Status: DC
  Administered 2017-10-02 – 2017-10-04 (×3): 1 via ORAL
  Filled 2017-10-02 (×3): qty 1

## 2017-10-02 MED ORDER — ALBUTEROL SULFATE (2.5 MG/3ML) 0.083% IN NEBU: 2.5000 mg | INHALATION_SOLUTION | Freq: Four times a day (QID) | RESPIRATORY_TRACT | Status: DC | PRN

## 2017-10-02 MED ORDER — METOPROLOL SUCCINATE ER 50 MG PO TB24
50.0000 mg | ORAL_TABLET | Freq: Every day | ORAL | Status: DC
  Administered 2017-10-02 – 2017-10-04 (×3): 50 mg via ORAL
  Filled 2017-10-02 (×3): qty 1

## 2017-10-02 NOTE — Consult Note (Signed)
[facility] Nurse wound consult note Reason for Consult: left leg wound Patient treated outpatient for wound and associated cellulitis. Worsening symptoms despite PO antibiotics.  Reported to have a "scab that would not heal" for a month.  She admits that she may have scratched this area. Her legs to appear to have some skin changes consistent with venous disease. She reports she can not stand anything on her legs, therefore any type of compression therapy long term would not be recommended.  Wound type: superficial skin loss, however now appears dry.  Surrounding erythema  Pressure Injury POA: NA Measurement:7cm x 9cm x 0cm  Wound bed: dry, erythematous skin/rash Drainage (amount, consistency, odor) none Periwound: mild LE edema, warm, distal pulses palpable  Dressing procedure/placement/frequency: Due to her current list of medication allergies and reactions I am cautious to add anything topically. Explained to patient that systemic antibiotic will be our first choice and that I really didn't think any topical care was needed at this time.   Discussed POC with patient and bedside nurse.  Re consult if needed, will not follow at this time. Thanks  Miranda Garber [employer], RN,CWOCN, CNS, [place] [phone])

## 2017-10-02 NOTE — ED Provider Notes (Signed)
[facility] EMERGENCY DEPARTMENT Provider Note   CSN: [id number] Arrival date & time: 10/02/17  9983     History   Chief Complaint Chief Complaint  Patient presents with  . Wound Check    HPI Amanda Butler is a 62 y.o. female.  HPI   62 year old female with history of obesity, CHF, GERD, hypertension, hyper cholesteremia, and diabetes presenting for evaluation of a leg wound.  Patient mention she noticed a small bump on the left lower leg for approximately a month and a half ago.  Since then has been increasing in size and increased tenderness and redness.  She has been seen and evaluated for her wound by both her primary care provider and a wound care specialist.  She initially was treated with doxycycline for 14 days without any improvement.  She received wound care weekly by her specialist, her culture grew Serratia marcescens sensitive to Cipro and Bactrim.  Patient was taken Cipro however developed an allergic reaction and was unable to tolerate it.  She also reports she may have an allergic reaction to Septra but she cannot recall specific Allergies.  In the setting of failed outpatient treatment for her infection, her wound care specialist recommend patient to be admitted for IV antibiotic.  Past Medical History:  Diagnosis Date  . Arthritis    "knees" (09/13/2015)  . Asthma   . Chronic diastolic CHF (congestive heart failure) ([place])    a. 09/2015 Echo: EF 55-60%, mild LVH, gr2DD, no rwma, mild MR, mildly dil LA.   Marland Kitchen GERD (gastroesophageal reflux disease)   . High cholesterol   . Hypertension   . Midsternal chest pain    a. 09/2015 MV: no ischemia/infarct, EF 61%; b. 09/2015 Low prob V:Q scan.  . Morbid obesity ([place])   . Obesity   . Pneumonia ~ 2000   "mild case"  . RBBB (right bundle branch block) approx 2010   Dr Marisue Humble did ECG and told her  . Type II diabetes mellitus [facility])     Patient Active Problem List   Diagnosis Date Noted  . Chronic  right-sided low back pain with right-sided sciatica 06/22/2017  . Closed nondisplaced fracture of greater tuberosity of left humerus 04/23/2017  . Hypertension   . Type II diabetes mellitus ([place])   . High cholesterol   . Morbid obesity ([place])   . Chronic diastolic CHF (congestive heart failure) ([place])   . Midsternal chest pain   . Pain in the chest   . Essential hypertension   . Morbid obesity due to excess calories ([place])   . Chest pain 09/13/2015  . Bilateral lower extremity edema 09/13/2015  . Diabetes mellitus type 2, diet-controlled ([place]) 09/13/2015  . Obesity 09/13/2015  . Total bilirubin, elevated 09/13/2015  . HTN (hypertension) 09/13/2015  . Stasis dermatitis of both legs 09/13/2015  . Left leg cellulitis 09/13/2015  . RBBB 09/13/2015  . Asthma 09/13/2015  . GERD (gastroesophageal reflux disease) 09/13/2015  . 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    Past Surgical History:  Procedure Laterality Date  . BUNIONECTOMY Left   . COLONOSCOPY WITH PROPOFOL N/A 09/23/2016   Procedure: COLONOSCOPY WITH PROPOFOL;  Surgeon: Garlan Fair, MD;  Location: [facility] ENDOSCOPY;  Service: Endoscopy;  Laterality: N/A;  . MULTIPLE TOOTH EXTRACTIONS  ~ 1975  . TENDON RELEASE Right    Leg  . TUBAL LIGATION  ~ 1983     OB History   None      Home Medications  Prior to Admission medications   Medication Sig Start Date End Date Taking? Authorizing Provider  acetaminophen (TYLENOL) 325 MG tablet Take 2 tablets (650 mg total) by mouth every 6 (six) hours as needed. Do not take more than 4000mg  of tylenol per day 06/18/17   Couture, Cortni S, PA-C  albuterol (PROVENTIL HFA;VENTOLIN HFA) 108 (90 BASE) MCG/ACT inhaler Inhale 2 puffs into the lungs 2 (two) times daily as needed for wheezing or shortness of breath.     [provider]  albuterol (PROVENTIL) (2.5 MG/3ML) 0.083% nebulizer solution Take 2.5 mg by nebulization every 6 (six) hours as needed for wheezing or shortness of breath.     [provider]  cephALEXin (KEFLEX) 500 MG capsule Take 1 capsule (500 mg total) by mouth 4 (four) times daily. 04/10/17   Lajean Saver, MD  clotrimazole (LOTRIMIN) 1 % cream Apply to affected area 2 times daily 04/10/17   Lajean Saver, MD  famotidine (PEPCID) 10 MG tablet Take 10 mg by mouth daily as needed for heartburn.     [provider]  JANUVIA 100 MG tablet Take 100 mg by mouth daily. 07/02/16   [provider]  lisinopril (PRINIVIL,ZESTRIL) 10 MG tablet Take 1 tablet (10 mg total) by mouth daily. 07/08/16   Croitoru, Mihai, MD  methocarbamol (ROBAXIN) 500 MG tablet Take 1 tablet (500 mg total) by mouth 4 (four) times daily. 06/22/17   Aundra Dubin, PA-C  metoprolol succinate (TOPROL-XL) 50 MG 24 hr tablet Take 50 mg by mouth daily. Take with or immediately following a meal.    [provider]  mometasone (NASONEX) 50 MCG/ACT nasal spray Place 2 sprays into the nose daily.     [provider]  oxyCODONE-acetaminophen (PERCOCET/ROXICET) 5-325 MG tablet Take 1 tablet by mouth every 8 (eight) hours as needed for severe pain. Do not take while driving, working, or operating machinery 06/18/17   Couture, Cortni S, PA-C  pravastatin (PRAVACHOL) 20 MG tablet Take 20 mg by mouth daily.    [provider]  traMADol (ULTRAM) 50 MG tablet Take 1 tablet (50 mg total) by mouth every 6 (six) hours as needed. 06/22/17   Aundra Dubin, PA-C  triamterene-hydrochlorothiazide (MAXZIDE) 75-50 MG per tablet Take 1 tablet by mouth daily.    [provider]  Zinc Oxide 13 % CREA Apply 1 application topically 2 (two) times daily. 06/14/17   Albesa Seen, PA-C    Family History Family History  Problem Relation Age of Onset  . Diabetes Mother   . Hypertension Mother   . Diabetes Father   . Hypertension Father   . Heart attack Father 51  . Diabetes Maternal Aunt   . Diabetes Maternal Grandmother   . Hypertension Maternal Grandmother   .  Cancer Maternal Grandfather   . Diabetes Maternal Grandfather   . Hypertension Maternal Grandfather   . Asthma Paternal Grandmother   . Diabetes Paternal Grandmother   . Hypertension Paternal Grandmother   . Diabetes Paternal Grandfather   . Hypertension Paternal Grandfather   . Heart failure Paternal Grandfather     Social History Social History   Tobacco Use  . Smoking status: Never Smoker  . Smokeless tobacco: Never Used  Substance Use Topics  . Alcohol use: No  . Drug use: No     Allergies   Bee venom; Shellfish allergy; Aspirin; Ciprofloxacin; Iodine; Latex; Metformin and related; Morphine and related; and Nsaids   Review of Systems Review of Systems  All other systems reviewed and are negative.    Physical Exam Updated Vital Signs BP (!) 183/93 (BP Location: Right Arm)   Pulse 86   Temp 98.4 F (36.9 C) (Oral)   Resp 20   Ht 5\' 6"  (1.676 m)   Wt 120.2 kg (265 lb)   SpO2 99%   BMI 42.77 kg/m   Physical Exam  Constitutional: She appears well-developed and well-nourished. No distress.  HENT:  Head: Atraumatic.  Eyes: Conjunctivae are normal.  Neck: Neck supple.  Cardiovascular: Normal rate and regular rhythm.  Pulmonary/Chest: Effort normal and breath sounds normal.  Abdominal: Soft.  Neurological: She is alert.  Skin: Rash (Left lower extremity: There is a well marginated area of skin erythema weeping small amount of serous fluid measuring approximately 3 x 4 cm to the mid lateral aspect of the lower leg.  There is warmth and mild edema.) noted.  Psychiatric: She has a normal mood and affect.  Nursing note and vitals reviewed.    ED Treatments / Results  Labs (all labs ordered are listed, but only abnormal results are displayed) Labs Reviewed  COMPREHENSIVE METABOLIC PANEL - Abnormal; Notable for the following components:      Result Value   Glucose, Bld 192 (*)    All other components within normal limits  CBC WITH DIFFERENTIAL/PLATELET    I-STAT CG4 LACTIC ACID, ED  I-STAT CG4 LACTIC ACID, ED    EKG None  Radiology No results found.  Procedures Procedures (including critical care time)  Medications Ordered in ED Medications  cefTRIAXone (ROCEPHIN) 1 g in sodium chloride 0.9 % 100 mL IVPB (has no administration in time range)     Initial Impression / Assessment and Plan / ED Course  I have reviewed the triage vital signs and the nursing notes.  Pertinent labs & imaging results that were available during my care of the patient were reviewed by me and considered in my medical decision making (see chart for details).     BP (!) 183/93 (BP Location: Right Arm)   Pulse 86   Temp 98.4 F (36.9 C) (Oral)   Resp 20   Ht 5\' 6"  (1.676 m)   Wt 120.2 kg (265 lb)   SpO2 99%   BMI 42.77 kg/m    Final Clinical Impressions(s) / ED Diagnoses   Final diagnoses:  Left leg cellulitis    ED Discharge Orders    None     Patient sent here by her wound care specialist for cellulitis of her left lower extremity which has failed outpatient treatment.  It was noted that her wound culture grew Serratia marcescens sensitive to Cipro and Bactrim however it appears patient is allergic to Cipro and potentially allergic to Bactrim.  She is currently afebrile, nontoxic in appearance.  Plan to consult medicine for admission.  8:17 AM Pt is sensitive to ceftriaxone.  After discussion with pharmacy, pt will be started on Rocephin.  Appreciate consultation from [facility], Dyanne Carrel, NP who agrees to see and admit pt for obs.    Domenic Moras, PA-C 10/02/17 7829    Blanchie Dessert, MD 10/03/17 0100

## 2017-10-02 NOTE — Progress Notes (Signed)
Chaplain Note: Re; Advanced Directive Request  I assisted patient in acquiring and completing HCPOA and Living Will.  Documents were witnessed and notarized. Original and copies provided to patient and copy given to unit secretary to be included in patient's chart.   Sue Lush

## 2017-10-02 NOTE — ED Triage Notes (Signed)
Patient states that she was sent by provider for possible admission for IV abx for wound to Left lower leg.

## 2017-10-02 NOTE — H&P (Addendum)
History and Physical    Amanda Butler JHE:[id number] DOB: Oct 24, 1955 DOA: 10/02/2017  PCP: Gaynelle Arabian, MD Patient coming from: home  Chief Complaint: Left lower extremity cellulitis  HPI: Amanda Butler is a 62 y.o. female with medical history significant Diabetes, right bundle branch block, obesity, hypertension, GERD, chronic diastolic heart failure, asthma, chronic back pain presents to the emergency department from the wound clinic with the chief complaint persistent worsening left lower extremity cellulitis that has failed OP treatment. [facility] are asked to admit  Information is obtained from the patient and the chart. Patient states cellulitis started is a very small "scab that would not heal". Over the last month she's been going to the wound clinic and area is worsened in spite of antibiotics. Initially she was treated with doxycycline for 2 weeks without any improvement. She also received wound care weekly. Cultures reportedly grew Serratia marcescens that is sensitive to Cipro and Bactrim O patient has taken Cipro in the past and developed a "reaction" that involve heart palpitations and she could not tolerate it. She also reports a "reaction" to Septra. She denies headache dizziness syncope near-syncope. She denies fever chills nausea vomiting diarrhea.   Pt is widowed x 5 yrs (early onset Alzheimer's), has 80 yo daughter w mid thoracic paralysis from transverse myelitis at age 34, pt takes care of her. Has recently had some difficutly walking/ feeling the ground and told it is prob diab neuropathy is getting PT to try to improve. Her daughter recently placed in SNF and won't be able to come home until/ unless patient can regain her mobility / strength to take care of her. No etoh/ tobacco.   ED Course: emergency department she's afebrile hemodynamically stable and not hypoxic.  Review of Systems: As per HPI otherwise all other systems reviewed and are negative.    Ambulatory Status:lives at home with her family. Uses a walker for ambulation. Recently fell while getting out of car reports no injury  Past Medical History:  Diagnosis Date  . Arthritis    "knees" (09/13/2015)  . Asthma   . Chronic diastolic CHF (congestive heart failure) ([place])    a. 09/2015 Echo: EF 55-60%, mild LVH, gr2DD, no rwma, mild MR, mildly dil LA.   Marland Kitchen GERD (gastroesophageal reflux disease)   . High cholesterol   . Hypertension   . Midsternal chest pain    a. 09/2015 MV: no ischemia/infarct, EF 61%; b. 09/2015 Low prob V:Q scan.  . Morbid obesity ([place])   . Obesity   . Pneumonia ~ 2000   "mild case"  . RBBB (right bundle branch block) approx 2010   Dr Marisue Humble did ECG and told her  . Type II diabetes mellitus ([place])     Past Surgical History:  Procedure Laterality Date  . BUNIONECTOMY Left   . COLONOSCOPY WITH PROPOFOL N/A 09/23/2016   Procedure: COLONOSCOPY WITH PROPOFOL;  Surgeon: Garlan Fair, MD;  Location: [facility] ENDOSCOPY;  Service: Endoscopy;  Laterality: N/A;  . MULTIPLE TOOTH EXTRACTIONS  ~ 1975  . TENDON RELEASE Right    Leg  . TUBAL LIGATION  ~ 1983    Social History   Socioeconomic History  . Marital status: Widowed    Spouse name: Not on file  . Number of children: Not on file  . Years of education: Not on file  . Highest education level: Not on file  Occupational History  . 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  Financial resource strain: Not on file  . Food insecurity:    Worry: Not on file    Inability: Not on file  . Transportation needs:    Medical: Not on file    Non-medical: Not on file  Tobacco Use  . Smoking status: Never Smoker  . Smokeless tobacco: Never Used  Substance and Sexual Activity  . Alcohol use: No  . Drug use: No  . Sexual activity: Never  Lifestyle  . Physical activity:    Days per week: Not on file    Minutes per session: Not on file  . Stress: Not on file  Relationships  .  Social connections:    Talks on phone: Not on file    Gets together: Not on file    Attends religious service: Not on file    Active member of club or organization: Not on file    Attends meetings of clubs or organizations: Not on file    Relationship status: Not on file  . Intimate partner violence:    Fear of current or ex partner: Not on file    Emotionally abused: Not on file    Physically abused: Not on file    Forced sexual activity: Not on file  Other Topics Concern  . Not on file  Social History Narrative   Pt lives with disabled daughter, is her primary caregiver.    Allergies  Allergen Reactions  . Bee Venom Anaphylaxis  . Shellfish Allergy Anaphylaxis  . Aspirin Hives  . Ciprofloxacin   . Iodine Other (See Comments)    Doesn't remember   . Latex Hives  . Metformin And Related Other (See Comments)    Patient reports flu-like symptoms and fatigue  . Morphine And Related Other (See Comments)    Childhood allergy (62 years old) , pt got sicker , temp went up, dr gave her the wrong medication  . Nsaids Hives    Family History  Problem Relation Age of Onset  . Diabetes Mother   . Hypertension Mother   . Diabetes Father   . Hypertension Father   . Heart attack Father 53  . Diabetes Maternal Aunt   . Diabetes Maternal Grandmother   . Hypertension Maternal Grandmother   . Cancer Maternal Grandfather   . Diabetes Maternal Grandfather   . Hypertension Maternal Grandfather   . Asthma Paternal Grandmother   . Diabetes Paternal Grandmother   . Hypertension Paternal Grandmother   . Diabetes Paternal Grandfather   . Hypertension Paternal Grandfather   . Heart failure Paternal Grandfather     Prior to Admission medications   Medication Sig Start Date End Date Taking? Authorizing Provider  acetaminophen (TYLENOL) 325 MG tablet Take 2 tablets (650 mg total) by mouth every 6 (six) hours as needed. Do not take more than 4000mg  of tylenol per day 06/18/17   Couture, Cortni  S, PA-C  albuterol (PROVENTIL HFA;VENTOLIN HFA) 108 (90 BASE) MCG/ACT inhaler Inhale 2 puffs into the lungs 2 (two) times daily as needed for wheezing or shortness of breath.     [provider]  albuterol (PROVENTIL) (2.5 MG/3ML) 0.083% nebulizer solution Take 2.5 mg by nebulization every 6 (six) hours as needed for wheezing or shortness of breath.    [provider]  cephALEXin (KEFLEX) 500 MG capsule Take 1 capsule (500 mg total) by mouth 4 (four) times daily. 04/10/17   Lajean Saver, MD  clotrimazole (LOTRIMIN) 1 % cream Apply to affected area 2 times daily 04/10/17  Lajean Saver, MD  famotidine (PEPCID) 10 MG tablet Take 10 mg by mouth daily as needed for heartburn.     [provider]  JANUVIA 100 MG tablet Take 100 mg by mouth daily. 07/02/16   [provider]  lisinopril (PRINIVIL,ZESTRIL) 10 MG tablet Take 1 tablet (10 mg total) by mouth daily. 07/08/16   Croitoru, Mihai, MD  methocarbamol (ROBAXIN) 500 MG tablet Take 1 tablet (500 mg total) by mouth 4 (four) times daily. 06/22/17   Aundra Dubin, PA-C  metoprolol succinate (TOPROL-XL) 50 MG 24 hr tablet Take 50 mg by mouth daily. Take with or immediately following a meal.    [provider]  mometasone (NASONEX) 50 MCG/ACT nasal spray Place 2 sprays into the nose daily.     [provider]  oxyCODONE-acetaminophen (PERCOCET/ROXICET) 5-325 MG tablet Take 1 tablet by mouth every 8 (eight) hours as needed for severe pain. Do not take while driving, working, or operating machinery 06/18/17   Couture, Cortni S, PA-C  pravastatin (PRAVACHOL) 20 MG tablet Take 20 mg by mouth daily.    [provider]  traMADol (ULTRAM) 50 MG tablet Take 1 tablet (50 mg total) by mouth every 6 (six) hours as needed. 06/22/17   Aundra Dubin, PA-C  triamterene-hydrochlorothiazide (MAXZIDE) 75-50 MG per tablet Take 1 tablet by mouth daily.    [provider]  Zinc Oxide 13 % CREA Apply 1  application topically 2 (two) times daily. 06/14/17   Albesa Seen, PA-C    Physical Exam: Vitals:   10/02/17 0626  BP: (!) 183/93  Pulse: 86  Resp: 20  Temp: 98.4 F (36.9 C)  TempSrc: Oral  SpO2: 99%  Weight: 120.2 kg (265 lb)  Height: 5\' 6"  (1.676 m)     General:  Appears calm comfortable and quite unkept Eyes:  PERRL, EOMI, normal lids, iris ENT:  grossly normal hearing, lips & tongue, his membranes of her mouth are moist and pink Neck:  no LAD, masses or thyromegaly Cardiovascular:  RRR, no m/r/g. Trace bilateral LE edema.  Respiratory:  CTA bilaterally, no w/r/r. Normal respiratory effort. Abdomen:  soft, ntnd, obese positive bowel sounds throughout no guarding or rebounding R buttock has string of heaped up genital warts/ skin tags 1 x 4 cm, no drainage or signs of acute infection; also chronic skin discoloration bilat buttocks/ R post thigh; no pressure injury Skin:  Left lower extremity with moderate amount well marginated erythema and edema on lateral aspect, heat, tenderness and small amount yellowish weeping. Musculoskeletal:  grossly normal tone BUE/BLE, good ROM, no bony abnormality Psychiatric:  grossly normal mood and affect, speech fluent and appropriate, AOx3 Neurologic:  CN 2-12 grossly intact, moves all extremities in coordinated fashion, sensation intact  Labs on Admission: I have personally reviewed following labs and imaging studies  CBC: Recent Labs  Lab 10/02/17 0649  WBC 7.5  NEUTROABS 5.2  HGB 14.8  HCT 45.3  MCV 88.8  PLT 627   Basic Metabolic Panel: Recent Labs  Lab 10/02/17 0649  NA 140  K 3.9  CL 106  CO2 26  GLUCOSE 192*  BUN 7  CREATININE 0.97  CALCIUM 9.2   GFR: Estimated Creatinine Clearance: 77.4 mL/min (by C-G formula based on SCr of 0.97 mg/dL). Liver Function Tests: Recent Labs  Lab 10/02/17 0649  AST 18  ALT 17  ALKPHOS 73  BILITOT 1.2  PROT 7.6  ALBUMIN 3.6   No results for input(s): LIPASE, AMYLASE in  the  last 168 hours. No results for input(s): AMMONIA in the last 168 hours. Coagulation Profile: No results for input(s): INR, PROTIME in the last 168 hours. Cardiac Enzymes: No results for input(s): CKTOTAL, CKMB, CKMBINDEX, TROPONINI in the last 168 hours. BNP (last 3 results) No results for input(s): PROBNP in the last 8760 hours. HbA1C: No results for input(s): HGBA1C in the last 72 hours. CBG: No results for input(s): GLUCAP in the last 168 hours. Lipid Profile: No results for input(s): CHOL, HDL, LDLCALC, TRIG, CHOLHDL, LDLDIRECT in the last 72 hours. Thyroid Function Tests: No results for input(s): TSH, T4TOTAL, FREET4, T3FREE, THYROIDAB in the last 72 hours. Anemia Panel: No results for input(s): VITAMINB12, FOLATE, FERRITIN, TIBC, IRON, RETICCTPCT in the last 72 hours. Urine analysis:    Component Value Date/Time   COLORURINE YELLOW 09/13/2015 1228   APPEARANCEUR CLOUDY (A) 09/13/2015 1228   LABSPEC 1.013 09/13/2015 1228   PHURINE 5.5 09/13/2015 1228   GLUCOSEU NEGATIVE 09/13/2015 1228   HGBUR NEGATIVE 09/13/2015 1228   BILIRUBINUR NEGATIVE 09/13/2015 1228   KETONESUR NEGATIVE 09/13/2015 1228   PROTEINUR NEGATIVE 09/13/2015 1228   NITRITE NEGATIVE 09/13/2015 1228   LEUKOCYTESUR TRACE (A) 09/13/2015 1228    Creatinine Clearance: Estimated Creatinine Clearance: 77.4 mL/min (by C-G formula based on SCr of 0.97 mg/dL).  Sepsis Labs: @LABRCNTIP (procalcitonin:4,lacticidven:4) )No results found for this or any previous visit (from the past 240 hour(s)).   Radiological Exams on Admission: No results found.  EKG:   Assessment/Plan Active Problems:   Left leg cellulitis   Morbid obesity ([facility])   Diabetes mellitus type 2, diet-controlled ([facility])   Essential hypertension   Chronic diastolic CHF (congestive heart failure) ([facility])   Cellulitis   #1. Left leg cellulitis. Persistent ongoing over the last 6 weeks failing outpatient therapy. Recent culture reveals Serratia  Marcescens. Persisted and worsened in spite of outpatient antibiotics and weekly wound treatment. She is afebrile hemodynamically stable and nontoxic appearing. Has hx "reaction" to many antibiotics.  -admit to medical bed -rocephin per pharmacy -follow blood cultures -wound care consult  2. Diabetes. Serum glucose 192. Home medications include Januvia. -Obtain a hemoglobin A1c -Sliding scale insulin for optimal control  #3. Hypertension. Poor control in the emergency department home medications include lisinopril, metoprolol, maxide. -continue home meds -monitor  #4. Chronic diastolic HF. Appears compensated. Echo 2017 revealed EF 55% with mild LVH and grade 2 diastolic dysfunction -continue home meds -Monitor intake and output  #5.morbid obesity. BMI 42.7 -nutritional consult    DVT prophylaxis: lovenox  Code Status: full  Family Communication: none present  Disposition Plan: home  Consults called: wound consult  Admission status: obs    Dyanne Carrel M MD [facility]  If 7PM-7AM, please contact night-coverage [email or web address] Password [facility]  10/02/2017, 8:37 AM     Patient seen and examined and agree with assessment and plan as per K. Black, PA.  Patient w/ nonhealing wound L leg admitted for IV abx.  Also has recent onset diab neuropathy w difficulty ambulating getting outpatient Rx for this.   Kelly Splinter MD [facility] pgr [phone] 09/06/2017, 9:25 AM

## 2017-10-03 DIAGNOSIS — Z7951 Long term (current) use of inhaled steroids: Secondary | ICD-10-CM | POA: Diagnosis not present

## 2017-10-03 DIAGNOSIS — Z833 Family history of diabetes mellitus: Secondary | ICD-10-CM | POA: Diagnosis not present

## 2017-10-03 DIAGNOSIS — E119 Type 2 diabetes mellitus without complications: Secondary | ICD-10-CM | POA: Diagnosis not present

## 2017-10-03 DIAGNOSIS — E1140 Type 2 diabetes mellitus with diabetic neuropathy, unspecified: Secondary | ICD-10-CM | POA: Diagnosis present

## 2017-10-03 DIAGNOSIS — Z91030 Bee allergy status: Secondary | ICD-10-CM | POA: Diagnosis not present

## 2017-10-03 DIAGNOSIS — J45909 Unspecified asthma, uncomplicated: Secondary | ICD-10-CM | POA: Diagnosis present

## 2017-10-03 DIAGNOSIS — M170 Bilateral primary osteoarthritis of knee: Secondary | ICD-10-CM | POA: Diagnosis present

## 2017-10-03 DIAGNOSIS — Z91040 Latex allergy status: Secondary | ICD-10-CM | POA: Diagnosis not present

## 2017-10-03 DIAGNOSIS — I5032 Chronic diastolic (congestive) heart failure: Secondary | ICD-10-CM | POA: Diagnosis not present

## 2017-10-03 DIAGNOSIS — K219 Gastro-esophageal reflux disease without esophagitis: Secondary | ICD-10-CM | POA: Diagnosis present

## 2017-10-03 DIAGNOSIS — Z6841 Body Mass Index (BMI) 40.0 and over, adult: Secondary | ICD-10-CM | POA: Diagnosis not present

## 2017-10-03 DIAGNOSIS — Z885 Allergy status to narcotic agent status: Secondary | ICD-10-CM | POA: Diagnosis not present

## 2017-10-03 DIAGNOSIS — I110 Hypertensive heart disease with heart failure: Secondary | ICD-10-CM | POA: Diagnosis present

## 2017-10-03 DIAGNOSIS — E7800 Pure hypercholesterolemia, unspecified: Secondary | ICD-10-CM | POA: Diagnosis present

## 2017-10-03 DIAGNOSIS — Z79899 Other long term (current) drug therapy: Secondary | ICD-10-CM | POA: Diagnosis not present

## 2017-10-03 DIAGNOSIS — L03116 Cellulitis of left lower limb: Secondary | ICD-10-CM | POA: Diagnosis not present

## 2017-10-03 DIAGNOSIS — Z91013 Allergy to seafood: Secondary | ICD-10-CM | POA: Diagnosis not present

## 2017-10-03 DIAGNOSIS — Z886 Allergy status to analgesic agent status: Secondary | ICD-10-CM | POA: Diagnosis not present

## 2017-10-03 DIAGNOSIS — Z888 Allergy status to other drugs, medicaments and biological substances status: Secondary | ICD-10-CM | POA: Diagnosis not present

## 2017-10-03 LAB — GLUCOSE, CAPILLARY
Glucose-Capillary: 174 mg/dL — ABNORMAL HIGH (ref 65–99)
Glucose-Capillary: 183 mg/dL — ABNORMAL HIGH (ref 65–99)
Glucose-Capillary: 148 mg/dL — ABNORMAL HIGH (ref 65–99)

## 2017-10-03 LAB — CBC
HCT: 45.2 % (ref 36.0–46.0)
Hemoglobin: 14.7 g/dL (ref 12.0–15.0)
MCH: 29 pg (ref 26.0–34.0)
MCHC: 32.5 g/dL (ref 30.0–36.0)
MCV: 89.2 fL (ref 78.0–100.0)
Platelets: 236 10*3/uL (ref 150–400)
RBC: 5.07 MIL/uL (ref 3.87–5.11)
RDW: 13.5 % (ref 11.5–15.5)
WBC: 8 10*3/uL (ref 4.0–10.5)

## 2017-10-03 MED ORDER — HYDROCORTISONE 1 % EX OINT
TOPICAL_OINTMENT | Freq: Three times a day (TID) | CUTANEOUS | Status: DC
  Administered 2017-10-03 – 2017-10-04 (×3): via TOPICAL
  Filled 2017-10-03: qty 28

## 2017-10-03 NOTE — Progress Notes (Signed)
Patient Demographics:    Amanda Butler, is a 62 y.o. female, DOB - Jan 22, 1956, FIE:[id number]  Admit date - 10/02/2017   Admitting Physician Roney Jaffe, MD  Outpatient Primary MD for the patient is Gaynelle Arabian, MD  LOS - 0   Chief Complaint  Patient presents with  . Wound Check        Subjective:    Amanda Butler today has no fevers, no emesis,  No chest pain, sister-in-law at bedside, questions answered, no fevers, no chills, no vomiting or diarrhea  Assessment  & Plan :    Principal Problem:   Cellulitis Active Problems:   Diabetes mellitus type 2, diet-controlled (HCC)   Left leg cellulitis   Essential hypertension   Morbid obesity (HCC)   Chronic diastolic CHF (congestive heart failure) (HCC)  Brief summary 62 y.o. female with medical history significant Diabetes, right bundle branch block, obesity, hypertension, GERD, chronic diastolic heart failure, asthma, chronic back pain admitted on 10/02/2017 with left lower extremity cellulitis secondary to Serratia marcescens outpatient and culture, apparently failed doxycycline as outpatient and did not tolerate oral Cipro (nausea)    1)Lt LE cellulitis secondary to Serratia marcescens--- as prescribed, WBCs 8.0, possible discharge on p.o. Omnicef within the next 24 hours if continues to improve,  2)DM2 -stable, last A1c 7.1, Use Novolog/Humalog Sliding scale insulin with Accu-Cheks/Fingersticks as ordered   3)HFpEF--patient with history of chronic dCHF without acute exacerbation at this time, last known EF 55%, Toprol-XL and Maxzide  4)Morbid Obesity--BMI is 41... This complicates overall care  5)HTN-improving BP control, lisinopril 10 mg daily, metoprolol 50 mg daily,  Code Status : full   Disposition Plan  : home    DVT Prophylaxis  :  Lovenox   Lab Results  Component Value Date   PLT 236 10/03/2017    Inpatient  Medications  Scheduled Meds: . enoxaparin (LOVENOX) injection  55 mg Subcutaneous Q24H  . fluticasone  1 spray Each Nare Daily  . hydrocortisone   Topical TID  . insulin aspart  0-5 Units Subcutaneous QHS  . insulin aspart  0-9 Units Subcutaneous TID WC  . lisinopril  10 mg Oral Daily  . methocarbamol  500 mg Oral QID  . metoprolol succinate  50 mg Oral Daily  . nystatin   Topical TID  . pravastatin  20 mg Oral Daily  . triamterene-hydrochlorothiazide  1 tablet Oral Daily   Continuous Infusions: . sodium chloride 250 mL (10/03/17 0917)  . cefTRIAXone (ROCEPHIN)  IV 1 g (10/03/17 0914)   PRN Meds:.sodium chloride, acetaminophen, albuterol, famotidine, ondansetron **OR** ondansetron (ZOFRAN) IV, oxyCODONE-acetaminophen, traMADol    Anti-infectives (From admission, onward)   Start     Dose/Rate Route Frequency Ordered Stop   10/03/17 0900  cefTRIAXone (ROCEPHIN) 1 g in sodium chloride 0.9 % 100 mL IVPB     1 g 200 mL/hr over 30 Minutes Intravenous Every 24 hours 10/02/17 0827     10/02/17 0830  cefTRIAXone (ROCEPHIN) 1 g in sodium chloride 0.9 % 100 mL IVPB     1 g 200 mL/hr over 30 Minutes Intravenous  Once 10/02/17 0817 10/02/17 1102        Objective:   Vitals:   10/03/17 0026 10/03/17 0510 10/03/17 0515 10/03/17  1117  BP: (!) 147/69 130/64  130/74  Pulse: (!) 57 (!) 57  (!) 53  Resp:  18  20  Temp: 98.1 F (36.7 C) 98.4 F (36.9 C)  98.4 F (36.9 C)  TempSrc: Oral Oral  Oral  SpO2: 98% 97%  98%  Weight:   115.9 kg (255 lb 9.6 oz)   Height:        Wt Readings from Last 3 Encounters:  10/03/17 115.9 kg (255 lb 9.6 oz)  01/20/17 120.7 kg (266 lb)  09/23/16 120.2 kg (265 lb)     Intake/Output Summary (Last 24 hours) at 10/03/2017 1805 Last data filed at 10/03/2017 1248 Gross per 24 hour  Intake 715.23 ml  Output 2950 ml  Net -2234.77 ml     Physical Exam  Gen:- Awake Alert,  In no apparent distress  HEENT:- [place].AT, No sclera icterus Neck-Supple Neck,No  JVD,.  Lungs-  CTAB , good air movement CV- S1, S2 normal Abd-  +ve B.Sounds, Abd Soft, No tenderness,    Extremity/Skin:-Left lower extremity fibular side with erythema/swelling/warmth/tenderness, no streaking, no open wounds Psych-affect is appropriate, oriented x3 Neuro-no new focal deficits, no tremors   Data Review:   Micro Results No results found for this or any previous visit (from the past 240 hour(s)).  Radiology Reports No results found.   CBC Recent Labs  Lab 10/02/17 0649 10/03/17 0530  WBC 7.5 8.0  HGB 14.8 14.7  HCT 45.3 45.2  PLT 205 236  MCV 88.8 89.2  MCH 29.0 29.0  MCHC 32.7 32.5  RDW 13.2 13.5  LYMPHSABS 1.7  --   MONOABS 0.4  --   EOSABS 0.2  --   BASOSABS 0.1  --     Chemistries  Recent Labs  Lab 10/02/17 0649  NA 140  K 3.9  CL 106  CO2 26  GLUCOSE 192*  BUN 7  CREATININE 0.97  CALCIUM 9.2  AST 18  ALT 17  ALKPHOS 73  BILITOT 1.2   ------------------------------------------------------------------------------------------------------------------ No results for input(s): CHOL, HDL, LDLCALC, TRIG, CHOLHDL, LDLDIRECT in the last 72 hours.  Lab Results  Component Value Date   HGBA1C 7.1 (H) 10/02/2017   ------------------------------------------------------------------------------------------------------------------ No results for input(s): TSH, T4TOTAL, T3FREE, THYROIDAB in the last 72 hours.  Invalid input(s): FREET3 ------------------------------------------------------------------------------------------------------------------ No results for input(s): VITAMINB12, FOLATE, FERRITIN, TIBC, IRON, RETICCTPCT in the last 72 hours.  Coagulation profile No results for input(s): INR, PROTIME in the last 168 hours.  No results for input(s): DDIMER in the last 72 hours.  Cardiac Enzymes No results for input(s): CKMB, TROPONINI, MYOGLOBIN in the last 168 hours.  Invalid input(s):  CK ------------------------------------------------------------------------------------------------------------------ No results found for: BNP   Roxan Hockey M.D on 10/03/2017 at 6:05 PM  Between 7am to 7pm - Pager - [phone]  After 7pm go to [email or web address] - password [email or web address]  [facility] -  Office  [phone]   Voice Recognition Viviann Spare dictation system was used to create this note, attempts have been made to correct errors. Please contact the author with questions and/or clarifications.

## 2017-10-04 DIAGNOSIS — E119 Type 2 diabetes mellitus without complications: Secondary | ICD-10-CM

## 2017-10-04 LAB — BASIC METABOLIC PANEL
Anion gap: 12 (ref 5–15)
BUN: 19 mg/dL (ref 6–20)
CO2: 23 mmol/L (ref 22–32)
Calcium: 9.2 mg/dL (ref 8.9–10.3)
Chloride: 101 mmol/L (ref 101–111)
Creatinine, Ser: 1.12 mg/dL — ABNORMAL HIGH (ref 0.44–1.00)
GFR calc Af Amer: 59 mL/min — ABNORMAL LOW (ref >60)
GFR calc non Af Amer: 51 mL/min — ABNORMAL LOW (ref >60)
Glucose, Bld: 147 mg/dL — ABNORMAL HIGH (ref 65–99)
Potassium: 4.1 mmol/L (ref 3.5–5.1)
Sodium: 136 mmol/L (ref 135–145)

## 2017-10-04 LAB — GLUCOSE, CAPILLARY
Glucose-Capillary: 182 mg/dL — ABNORMAL HIGH (ref 65–99)
Glucose-Capillary: 147 mg/dL — ABNORMAL HIGH (ref 65–99)
Glucose-Capillary: 168 mg/dL — ABNORMAL HIGH (ref 65–99)

## 2017-10-04 MED ORDER — HYDROCORTISONE 1 % EX OINT: TOPICAL_OINTMENT | Freq: Two times a day (BID) | CUTANEOUS | 0 refills | Status: DC

## 2017-10-04 MED ORDER — CEFDINIR 300 MG PO CAPS: 300.0000 mg | ORAL_CAPSULE | Freq: Two times a day (BID) | ORAL | 0 refills | Status: AC

## 2017-10-04 NOTE — Progress Notes (Signed)
Patient given discharge instructions and all questions answered.  

## 2017-10-04 NOTE — Discharge Summary (Signed)
Amanda Butler, is a 62 y.o. female  DOB 02-02-1956  MRN [id number].  Admission date:  10/02/2017  Admitting Physician  Roney Jaffe, MD  Discharge Date:  10/04/2017   Primary MD  Gaynelle Arabian, MD  Recommendations for primary care physician for things to follow:   1)Take Omnicef/Cefdinir 300 mg twice a day for 1 week for left leg cellulitis/infection 2) hydrocortisone ointment couple times a day 3) you may follow-up with the wound care clinic for reevaluation 4) take all your other medications as prescribed including your diabetic medications  Admission Diagnosis  Left leg cellulitis [L03.116]   Discharge Diagnosis  Left leg cellulitis [H47.425]    Principal Problem:   Cellulitis Active Problems:   Diabetes mellitus type 2, diet-controlled ([place])   Left leg cellulitis   Essential hypertension   Morbid obesity ([place])   Chronic diastolic CHF (congestive heart failure) ([place])      Past Medical History:  Diagnosis Date  . Arthritis    "knees" (09/13/2015)  . Asthma   . Cellulitis   . Chronic diastolic CHF (congestive heart failure) ([place])    a. 09/2015 Echo: EF 55-60%, mild LVH, gr2DD, no rwma, mild MR, mildly dil LA.   Marland Kitchen GERD (gastroesophageal reflux disease)   . High cholesterol   . Hypertension   . Midsternal chest pain    a. 09/2015 MV: no ischemia/infarct, EF 61%; b. 09/2015 Low prob V:Q scan.  . Morbid obesity ([place])   . Obesity   . Pneumonia ~ 2000   "mild case"  . RBBB (right bundle branch block) approx 2010   Dr Marisue Humble did ECG and told her  . Type II diabetes mellitus ([place])     Past Surgical History:  Procedure Laterality Date  . BUNIONECTOMY Left   . COLONOSCOPY WITH PROPOFOL N/A 09/23/2016   Procedure: COLONOSCOPY WITH PROPOFOL;  Surgeon: Garlan Fair, MD;  Location: [facility] END[facility]COPY;  Service: Endoscopy;  Laterality: N/A;  . MULTIPLE TOOTH EXTRACTIONS  ~ 1975  . TENDON  RELEASE Right    Leg  . TUBAL LIGATION  ~ 1983     HPI  from the history and physical done on the day of admission:    Chief Complaint: Left lower extremity cellulitis  HPI: Amanda Butler is a 62 y.o. female with medical history significant Diabetes, right bundle branch block, obesity, hypertension, GERD, chronic diastolic heart failure, asthma, chronic back pain presents to the emergency department from the wound clinic with the chief complaint persistent worsening left lower extremity cellulitis that has failed OP treatment. [facility] are asked to admit  Information is obtained from the patient and the chart. Patient states cellulitis started is a very small "scab that would not heal". Over the last month she's been going to the wound clinic and area is worsened in spite of antibiotics. Initially she was treated with doxycycline for 2 weeks without any improvement. She also received wound care weekly. Cultures reportedly grew Serratia marcescens that is sensitive to Cipro and Bactrim O  patient has taken Cipro in the past and developed a "reaction" that involve heart palpitations and she could not tolerate it. She also reports a "reaction" to Septra. She denies headache dizziness syncope near-syncope. She denies fever chills nausea vomiting diarrhea.   Pt is widowed x 5 yrs (early onset Alzheimer's), has 42 yo daughter w mid thoracic paralysis from transverse myelitis at age 27, pt takes care of her. Has recently had some difficutly walking/ feeling the ground and told it is prob diab neuropathy is getting PT to try to improve. Her daughter recently placed in SNF and won't be able to come home until/ unless patient can regain her mobility / strength to take care of her. No etoh/ tobacco.   ED Course: emergency department she's afebrile hemodynamically stable and not hypoxic.    Hospital Course:   Brief summary 62 y.o.femalewith medical history significantDiabetes, right bundle  branch block, obesity, hypertension, GERD, chronic diastolic heart failure, asthma, chronic back pain admitted on 10/02/2017 with left lower extremity cellulitis secondary to Serratia marcescens outpatient and culture, apparently failed doxycycline as outpatient and did not tolerate oral Cipro (nausea)   Plan:- 1)Lt LE cellulitis secondary to Serratia marcescens--- wound culture from 09/09/2017 reviewed, and grew Serratia marcescens with multiple sensitivities, overall much improved on IV Rocephin, okay to discharge home on p.o. Omnicef , no leukocytosis no fevers  2)DM2 -stable, last A1c 7.1,  resume home diabetic regimen   3)HFpEF--patient with history of chronic dCHF without acute exacerbation at this time, last known EF 55%, stable, continue Toprol-XL and Maxzide  4)Morbid Obesity--BMI is 41... This complicates overall care, lifestyle and medications discussed with patient  5)HTN-improving BP control, continue lisinopril 10 mg daily, metoprolol 50 mg daily,  Code Status : full  Disposition Plan  : home   Discharge Condition: stable  Follow UP  Follow-up Information    Gaynelle Arabian, MD.   Specialty:  Family Medicine Contact information: [address] [place] [address] [phone]           Diet and Activity recommendation:  As advised  Discharge Instructions    Discharge Instructions    Call MD for:  difficulty breathing, headache or visual disturbances   Complete by:  As directed    Call MD for:  persistant dizziness or light-headedness   Complete by:  As directed    Call MD for:  persistant nausea and vomiting   Complete by:  As directed    Call MD for:  redness, tenderness, or signs of infection (pain, swelling, redness, odor or green/yellow discharge around incision site)   Complete by:  As directed    Call MD for:  severe uncontrolled pain   Complete by:  As directed    Call MD for:  temperature >100.4   Complete by:  As  directed    Diet Carb Modified   Complete by:  As directed    Discharge instructions   Complete by:  As directed    1)Take Omnicef/Cefdinir 300 mg twice a day for 1 week for left leg cellulitis/infection 2) hydrocortisone ointment couple times a day 3) you may follow-up with the wound care clinic for reevaluation 4) take all your other medications as prescribed including your diabetic medications   Increase activity slowly   Complete by:  As directed         Discharge Medications     Allergies as of 10/04/2017      Reactions   Bee Venom Anaphylaxis  Iodine Shortness Of Breath, Rash   Shellfish Allergy Anaphylaxis   Aspirin Hives   Latex Hives   Metformin And Related Other (See Comments)   Patient reports flu-like symptoms and fatigue   Morphine And Related Other (See Comments)   Childhood allergy (63 years old) , pt got sicker , temp went up, dr gave her the wrong medication   Nsaids Hives   Ciprofloxacin Palpitations   Joint pain      Medication List    TAKE these medications   acetaminophen 325 MG tablet Commonly known as:  TYLENOL Take 2 tablets (650 mg total) by mouth every 6 (six) hours as needed. Do not take more than 4000mg  of tylenol per day   albuterol 108 (90 Base) MCG/ACT inhaler Commonly known as:  PROVENTIL HFA;VENTOLIN HFA Inhale 2 puffs into the lungs 2 (two) times daily as needed for wheezing or shortness of breath.   albuterol (2.5 MG/3ML) 0.083% nebulizer solution Commonly known as:  PROVENTIL Take 2.5 mg by nebulization every 6 (six) hours as needed for wheezing or shortness of breath.   cefdinir 300 MG capsule Commonly known as:  OMNICEF Take 1 capsule (300 mg total) by mouth 2 (two) times daily for 7 days.   famotidine 10 MG tablet Commonly known as:  PEPCID Take 10 mg by mouth daily as needed for heartburn.   hydrocortisone 1 % ointment Apply topically 2 (two) times daily.   JANUVIA 100 MG tablet Generic drug:  sitaGLIPtin Take 100  mg by mouth daily.   lisinopril 10 MG tablet Commonly known as:  PRINIVIL,ZESTRIL Take 1 tablet (10 mg total) by mouth daily.   metoprolol succinate 50 MG 24 hr tablet Commonly known as:  TOPROL-XL Take 50 mg by mouth daily. Take with or immediately following a meal.   mometasone 50 MCG/ACT nasal spray Commonly known as:  NASONEX Place 2 sprays into the nose daily.   pioglitazone 30 MG tablet Commonly known as:  ACTOS Take 30 mg by mouth daily.   pravastatin 20 MG tablet Commonly known as:  PRAVACHOL Take 20 mg by mouth daily.   triamterene-hydrochlorothiazide 75-50 MG tablet Commonly known as:  MAXZIDE Take 1 tablet by mouth daily.       Major procedures and Radiology Reports - PLEASE review detailed and final reports for all details, in brief -   No results found.  Micro Results    No results found for this or any previous visit (from the past 240 hour(s)).  Today   Subjective    Amanda Butler today has no new complaints, no fever no chills, no vomiting or diarrhea, eating and drinking well, ambulating without any problems,          Patient has been seen and examined prior to discharge   Objective   Blood pressure 120/69, pulse (!) 56, temperature 98.4 F (36.9 C), temperature source Oral, resp. rate 18, height 5\' 6"  (1.676 m), weight 114.1 kg (251 lb 8 oz), SpO2 100 %.   Intake/Output Summary (Last 24 hours) at 10/04/2017 1151 Last data filed at 10/04/2017 1149 Gross per 24 hour  Intake 1175.52 ml  Output 1800 ml  Net -624.48 ml    Exam Gen:- Awake Alert,  In no apparent distress  HEENT:- [place].AT, No sclera icterus Neck-Supple Neck,No JVD,.  Lungs-  CTAB , good air movement CV- S1, S2 normal Abd-  +ve B.Sounds, Abd Soft, No tenderness,    Extremity/Skin:-Left lower extremity fibular side with improving erythema/swelling/warmth/tenderness, no streaking,  no open wounds Psych-affect is appropriate, oriented x3 Neuro-no new focal deficits, no  tremors    Data Review   CBC w Diff:  Lab Results  Component Value Date   WBC 8.0 10/03/2017   HGB 14.7 10/03/2017   HCT 45.2 10/03/2017   PLT 236 10/03/2017   LYMPHOPCT 23 10/02/2017   MONOPCT 5 10/02/2017   EOSPCT 2 10/02/2017   BASOPCT 1 10/02/2017    CMP:  Lab Results  Component Value Date   NA 136 10/04/2017   K 4.1 10/04/2017   CL 101 10/04/2017   CO2 23 10/04/2017   BUN 19 10/04/2017   CREATININE 1.12 (H) 10/04/2017   PROT 7.6 10/02/2017   ALBUMIN 3.6 10/02/2017   BILITOT 1.2 10/02/2017   ALKPHOS 73 10/02/2017   AST 18 10/02/2017   ALT 17 10/02/2017  .   Total Discharge time is about 33 minutes  Roxan Hockey M.D on 10/04/2017 at 11:51 AM  [facility]   Office  [phone]  Voice Recognition Viviann Spare dictation system was used to create this note, attempts have been made to correct errors. Please contact the author with questions and/or clarifications.

## 2017-10-04 NOTE — Discharge Instructions (Signed)
1)Take Omnicef/Cefdinir 300 mg twice a day for 1 week for left leg cellulitis/infection 2) hydrocortisone ointment couple times a day 3) you may follow-up with the wound care clinic for reevaluation 4) take all your other medications as prescribed including your diabetic medications

## 2017-10-14 ENCOUNTER — Encounter (HOSPITAL_BASED_OUTPATIENT_CLINIC_OR_DEPARTMENT_OTHER): Payer: 59 | Attending: Physician Assistant

## 2017-10-14 DIAGNOSIS — Z8631 Personal history of diabetic foot ulcer: Secondary | ICD-10-CM | POA: Insufficient documentation

## 2017-10-14 DIAGNOSIS — I5020 Unspecified systolic (congestive) heart failure: Secondary | ICD-10-CM | POA: Insufficient documentation

## 2017-10-14 DIAGNOSIS — Z09 Encounter for follow-up examination after completed treatment for conditions other than malignant neoplasm: Secondary | ICD-10-CM | POA: Insufficient documentation

## 2017-10-14 DIAGNOSIS — L03116 Cellulitis of left lower limb: Secondary | ICD-10-CM | POA: Insufficient documentation

## 2017-10-21 DIAGNOSIS — L03116 Cellulitis of left lower limb: Secondary | ICD-10-CM | POA: Diagnosis not present

## 2017-10-21 DIAGNOSIS — Z8631 Personal history of diabetic foot ulcer: Secondary | ICD-10-CM | POA: Diagnosis not present

## 2017-10-21 DIAGNOSIS — Z09 Encounter for follow-up examination after completed treatment for conditions other than malignant neoplasm: Secondary | ICD-10-CM | POA: Diagnosis not present

## 2017-10-21 DIAGNOSIS — I5020 Unspecified systolic (congestive) heart failure: Secondary | ICD-10-CM | POA: Diagnosis not present

## 2017-11-08 ENCOUNTER — Encounter (HOSPITAL_COMMUNITY): Payer: Self-pay | Admitting: Nurse Practitioner

## 2017-11-08 ENCOUNTER — Emergency Department (HOSPITAL_COMMUNITY)
Admission: EM | Admit: 2017-11-08 | Discharge: 2017-11-09 | Disposition: A | Discharge status: Home / Self Care | Payer: 59 | Attending: Emergency Medicine | Admitting: Emergency Medicine

## 2017-11-08 DIAGNOSIS — Z91040 Latex allergy status: Secondary | ICD-10-CM | POA: Diagnosis not present

## 2017-11-08 DIAGNOSIS — I5032 Chronic diastolic (congestive) heart failure: Secondary | ICD-10-CM | POA: Insufficient documentation

## 2017-11-08 DIAGNOSIS — Z79899 Other long term (current) drug therapy: Secondary | ICD-10-CM | POA: Diagnosis not present

## 2017-11-08 DIAGNOSIS — Z7984 Long term (current) use of oral hypoglycemic drugs: Secondary | ICD-10-CM | POA: Insufficient documentation

## 2017-11-08 DIAGNOSIS — R638 Other symptoms and signs concerning food and fluid intake: Secondary | ICD-10-CM | POA: Insufficient documentation

## 2017-11-08 DIAGNOSIS — T679XXA Effect of heat and light, unspecified, initial encounter: Secondary | ICD-10-CM

## 2017-11-08 DIAGNOSIS — R2243 Localized swelling, mass and lump, lower limb, bilateral: Secondary | ICD-10-CM | POA: Insufficient documentation

## 2017-11-08 DIAGNOSIS — R0602 Shortness of breath: Secondary | ICD-10-CM | POA: Diagnosis present

## 2017-11-08 DIAGNOSIS — R110 Nausea: Secondary | ICD-10-CM | POA: Insufficient documentation

## 2017-11-08 DIAGNOSIS — J45909 Unspecified asthma, uncomplicated: Secondary | ICD-10-CM | POA: Insufficient documentation

## 2017-11-08 DIAGNOSIS — I110 Hypertensive heart disease with heart failure: Secondary | ICD-10-CM | POA: Diagnosis not present

## 2017-11-08 DIAGNOSIS — N390 Urinary tract infection, site not specified: Secondary | ICD-10-CM | POA: Diagnosis not present

## 2017-11-08 DIAGNOSIS — Z7902 Long term (current) use of antithrombotics/antiplatelets: Secondary | ICD-10-CM | POA: Diagnosis not present

## 2017-11-08 DIAGNOSIS — E119 Type 2 diabetes mellitus without complications: Secondary | ICD-10-CM | POA: Insufficient documentation

## 2017-11-08 LAB — CBC WITH DIFFERENTIAL/PLATELET
Basophils Absolute: 0 10*3/uL (ref 0.0–0.1)
Basophils Relative: 0 %
Eosinophils Absolute: 0.1 10*3/uL (ref 0.0–0.7)
Eosinophils Relative: 1 %
HCT: 44.3 % (ref 36.0–46.0)
Hemoglobin: 15.2 g/dL — ABNORMAL HIGH (ref 12.0–15.0)
Lymphocytes Relative: 16 %
Lymphs Abs: 1.4 10*3/uL (ref 0.7–4.0)
MCH: 30.4 pg (ref 26.0–34.0)
MCHC: 34.3 g/dL (ref 30.0–36.0)
MCV: 88.6 fL (ref 78.0–100.0)
Monocytes Absolute: 0.4 10*3/uL (ref 0.1–1.0)
Monocytes Relative: 5 %
Neutro Abs: 6.6 10*3/uL (ref 1.7–7.7)
Neutrophils Relative %: 78 %
Platelets: 201 10*3/uL (ref 150–400)
RBC: 5 MIL/uL (ref 3.87–5.11)
RDW: 14.1 % (ref 11.5–15.5)
WBC: 8.4 10*3/uL (ref 4.0–10.5)

## 2017-11-08 LAB — URINALYSIS, ROUTINE W REFLEX MICROSCOPIC
Bilirubin Urine: NEGATIVE
Glucose, UA: NEGATIVE mg/dL
Hgb urine dipstick: NEGATIVE
Ketones, ur: NEGATIVE mg/dL
Nitrite: NEGATIVE
Protein, ur: NEGATIVE mg/dL
Specific Gravity, Urine: 1.019 (ref 1.005–1.030)
pH: 5 (ref 5.0–8.0)

## 2017-11-08 LAB — CBG MONITORING, ED: Glucose-Capillary: 158 mg/dL — ABNORMAL HIGH (ref 70–99)

## 2017-11-08 NOTE — ED Provider Notes (Signed)
[facility] DEPT Provider Note: Georgena Spurling, MD, FACEP  CSN: [id number] MRN: [id number] ARRIVAL: 11/08/17 at [place]: [place] Exposure   HISTORY OF PRESENT ILLNESS  11/08/17 10:58 PM Amanda Butler is a 62 y.o. female whose air-conditioner has been broken for the past week.  It is been especially hot for the past 3 days and she feels she may have gotten overheated.  Specifically she has had more shortness of breath than usual as well as nausea and decreased appetite.  She has attempted to maintain her hydration by drinking fluids.  She denies chest pain.  She has a dry mouth but states this is chronic and due to medications.  She also has edema of her lower legs, left greater than right, which is worse than usual because she has been sitting in triage for several hours and has not been able to elevate her legs as usual.  Symptoms are not severe but her daughter advised her to come and get evaluated.  She also complains of a sensation of incomplete voiding and believes she may have a urinary tract infection.   Past Medical History:  Diagnosis Date  . Arthritis    "knees" (09/13/2015)  . Asthma   . Cellulitis   . Chronic diastolic CHF (congestive heart failure) ([place])    a. 09/2015 Echo: EF 55-60%, mild LVH, gr2DD, no rwma, mild MR, mildly dil LA.   Marland Kitchen GERD (gastroesophageal reflux disease)   . High cholesterol   . Hypertension   . Midsternal chest pain    a. 09/2015 MV: no ischemia/infarct, EF 61%; b. 09/2015 Low prob V:Q scan.  . Morbid obesity ([place])   . Obesity   . Pneumonia ~ 2000   "mild case"  . RBBB (right bundle branch block) approx 2010   Dr Marisue Humble did ECG and told her  . Type II diabetes mellitus ([place])     Past Surgical History:  Procedure Laterality Date  . BUNIONECTOMY Left   . COLONOSCOPY WITH PROPOFOL N/A 09/23/2016   Procedure: COLONOSCOPY WITH PROPOFOL;  Surgeon: Garlan Fair, MD;  Location: [facility];  Service: Endoscopy;   Laterality: N/A;  . MULTIPLE TOOTH EXTRACTIONS  ~ 1975  . TENDON RELEASE Right    Leg  . TUBAL LIGATION  ~ 1983    Family History  Problem Relation Age of Onset  . Diabetes Mother   . Hypertension Mother   . Diabetes Father   . Hypertension Father   . Heart attack Father 61  . Diabetes Maternal Aunt   . Diabetes Maternal Grandmother   . Hypertension Maternal Grandmother   . Cancer Maternal Grandfather   . Diabetes Maternal Grandfather   . Hypertension Maternal Grandfather   . Asthma Paternal Grandmother   . Diabetes Paternal Grandmother   . Hypertension Paternal Grandmother   . Diabetes Paternal Grandfather   . Hypertension Paternal Grandfather   . Heart failure Paternal Grandfather     Social History   Tobacco Use  . Smoking status: Never Smoker  . Smokeless tobacco: Never Used  Substance Use Topics  . Alcohol use: No  . Drug use: No    Prior to Admission medications   Medication Sig Start Date End Date Taking? Authorizing Provider  acetaminophen (TYLENOL) 500 MG tablet Take 1,000 mg by mouth every 6 (six) hours as needed for mild pain or headache.   Yes [provider]  albuterol (PROVENTIL HFA;VENTOLIN HFA) 108 (90 BASE) MCG/ACT inhaler  Inhale 2 puffs into the lungs 2 (two) times daily as needed for wheezing or shortness of breath.    Yes [provider]  albuterol (PROVENTIL) (2.5 MG/3ML) 0.083% nebulizer solution Take 2.5 mg by nebulization every 6 (six) hours as needed for wheezing or shortness of breath.   Yes [provider]  famotidine (PEPCID) 10 MG tablet Take 10 mg by mouth daily as needed for heartburn.    Yes [provider]  hydrocortisone 1 % ointment Apply topically 2 (two) times daily. 10/04/17  Yes Emokpae, Courage, MD  JANUVIA 100 MG tablet Take 100 mg by mouth daily. 07/02/16  Yes [provider]  lisinopril (PRINIVIL,ZESTRIL) 10 MG tablet Take 1 tablet (10 mg total) by mouth daily. 07/08/16  Yes Croitoru,  Mihai, MD  metoprolol succinate (TOPROL-XL) 50 MG 24 hr tablet Take 50 mg by mouth daily. Take with or immediately following a meal.   Yes [provider]  mometasone (NASONEX) 50 MCG/ACT nasal spray Place 2 sprays into the nose daily.    Yes [provider]  pioglitazone (ACTOS) 30 MG tablet Take 30 mg by mouth daily. 07/25/17  Yes [provider]  pravastatin (PRAVACHOL) 20 MG tablet Take 20 mg by mouth daily.   Yes [provider]  triamterene-hydrochlorothiazide (MAXZIDE) 75-50 MG per tablet Take 1 tablet by mouth daily.   Yes [provider]  acetaminophen (TYLENOL) 325 MG tablet Take 2 tablets (650 mg total) by mouth every 6 (six) hours as needed. Do not take more than 4000mg  of tylenol per day Patient not taking: Reported on 11/09/2017 06/18/17   Couture, Cortni S, PA-C    Allergies Bee venom; Iodine; Shellfish allergy; Aspirin; Latex; Metformin and related; Morphine and related; Nsaids; and Ciprofloxacin   REVIEW OF SYSTEMS  Negative except as noted here or in the History of Present Illness.   PHYSICAL EXAMINATION  Initial Vital Signs Blood pressure (!) 159/75, pulse 78, temperature 98 F (36.7 C), temperature source Oral, resp. rate 16, SpO2 98 %.  Examination General: Well-developed, obese female in no acute distress; appearance consistent with age of record HENT: normocephalic; atraumatic Eyes: pupils equal, round and reactive to light; extraocular muscles intact Neck: supple Heart: regular rate and rhythm Lungs: clear to auscultation bilaterally Abdomen: soft; nondistended; nontender;bowel sounds present Extremities: No deformity; full range of motion; edema of lower legs, left greater than right Neurologic: Awake, alert and oriented; motor function intact in all extremities and symmetric; no facial droop Skin: Warm and dry; chronic appearing stasis changes of lower legs; facial hirsutism Psychiatric: Normal mood and  affect   RESULTS  Summary of this visit's results, reviewed by myself:   EKG Interpretation  Date/Time:    Ventricular Rate:    PR Interval:    QRS Duration:   QT Interval:    QTC Calculation:   R Axis:     Text Interpretation:        Laboratory Studies: Results for orders placed or performed during the hospital encounter of 11/08/17 (from the past 24 hour(s))  CBG monitoring, ED     Status: Abnormal   Collection Time: 11/08/17  7:31 PM  Result Value Ref Range   Glucose-Capillary 158 (H) 70 - 99 mg/dL  Urinalysis, Routine w reflex microscopic     Status: Abnormal   Collection Time: 11/08/17 11:28 PM  Result Value Ref Range   Color, Urine YELLOW YELLOW   APPearance HAZY (A) CLEAR   Specific Gravity, Urine 1.019 1.005 -  1.030   pH 5.0 5.0 - 8.0   Glucose, UA NEGATIVE NEGATIVE mg/dL   Hgb urine dipstick NEGATIVE NEGATIVE   Bilirubin Urine NEGATIVE NEGATIVE   Ketones, ur NEGATIVE NEGATIVE mg/dL   Protein, ur NEGATIVE NEGATIVE mg/dL   Nitrite NEGATIVE NEGATIVE   Leukocytes, UA MODERATE (A) NEGATIVE   RBC / HPF 0-5 0 - 5 RBC/hpf   WBC, UA 21-50 0 - 5 WBC/hpf   Bacteria, UA FEW (A) NONE SEEN   Squamous Epithelial / LPF 11-20 0 - 5   Mucus PRESENT    Budding Yeast PRESENT   CBC with Differential/Platelet     Status: Abnormal   Collection Time: 11/08/17 11:28 PM  Result Value Ref Range   WBC 8.4 4.0 - 10.5 K/uL   RBC 5.00 3.87 - 5.11 MIL/uL   Hemoglobin 15.2 (H) 12.0 - 15.0 g/dL   HCT 44.3 36.0 - 46.0 %   MCV 88.6 78.0 - 100.0 fL   MCH 30.4 26.0 - 34.0 pg   MCHC 34.3 30.0 - 36.0 g/dL   RDW 14.1 11.5 - 15.5 %   Platelets 201 150 - 400 K/uL   Neutrophils Relative % 78 %   Neutro Abs 6.6 1.7 - 7.7 K/uL   Lymphocytes Relative 16 %   Lymphs Abs 1.4 0.7 - 4.0 K/uL   Monocytes Relative 5 %   Monocytes Absolute 0.4 0.1 - 1.0 K/uL   Eosinophils Relative 1 %   Eosinophils Absolute 0.1 0.0 - 0.7 K/uL   Basophils Relative 0 %   Basophils Absolute 0.0 0.0 - 0.1 K/uL   Basic metabolic panel     Status: Abnormal   Collection Time: 11/08/17 11:28 PM  Result Value Ref Range   Sodium 147 (H) 135 - 145 mmol/L   Potassium 3.9 3.5 - 5.1 mmol/L   Chloride 107 98 - 111 mmol/L   CO2 26 22 - 32 mmol/L   Glucose, Bld 146 (H) 70 - 99 mg/dL   BUN 22 8 - 23 mg/dL   Creatinine, Ser 1.00 0.44 - 1.00 mg/dL   Calcium 9.6 8.9 - 10.3 mg/dL   GFR calc non Af Amer 58 (L) >60 mL/min   GFR calc Af Amer >60 >60 mL/min   Anion gap 14 5 - 15   Imaging Studies: No results found.  ED COURSE and MDM  Nursing notes and initial vitals signs, including pulse oximetry, reviewed.  Vitals:   11/08/17 1924 11/08/17 2331  BP: (!) 159/75 (!) 143/68  Pulse: 78 62  Resp: 16 16  Temp: 98 F (36.7 C)   TempSrc: Oral   SpO2: 98% 99%   Urinalysis consistent with a urinary tract infection.  Urine has been sent for culture.  We will treat for UTI.   PROCEDURES    ED DIAGNOSES     ICD-10-CM   1. Lower urinary tract infectious disease N39.0   2. Heat exposure, initial encounter T67.9XXA        Hermine Feria, Jenny Reichmann, MD 11/09/17 [phone]

## 2017-11-08 NOTE — ED Notes (Signed)
Pt feels as if the energy has been drained from here. Only reports SOB in the heat of her un air conditioned home. Stated that she hasn t been eating or drinking very much due to being tired all the time. Skin is very dry but pt stated that is normal for her.

## 2017-11-08 NOTE — ED Triage Notes (Signed)
Pt states her AC in her house has been out for the last 3 days and was concerned that her health is in danger, states he wants to evaluated for possible heat exhaustion.

## 2017-11-09 LAB — BASIC METABOLIC PANEL
Anion gap: 14 (ref 5–15)
BUN: 22 mg/dL (ref 8–23)
CO2: 26 mmol/L (ref 22–32)
Calcium: 9.6 mg/dL (ref 8.9–10.3)
Chloride: 107 mmol/L (ref 98–111)
Creatinine, Ser: 1 mg/dL (ref 0.44–1.00)
GFR calc Af Amer: >60 mL/min (ref >60)
GFR calc non Af Amer: 58 mL/min — ABNORMAL LOW (ref >60)
Glucose, Bld: 146 mg/dL — ABNORMAL HIGH (ref 70–99)
Potassium: 3.9 mmol/L (ref 3.5–5.1)
Sodium: 147 mmol/L — ABNORMAL HIGH (ref 135–145)

## 2017-11-09 MED ORDER — CEPHALEXIN 500 MG PO CAPS
1000.0000 mg | ORAL_CAPSULE | Freq: Once | ORAL | Status: AC
  Administered 2017-11-09: 1000 mg via ORAL
  Filled 2017-11-09: qty 2

## 2017-11-09 MED ORDER — CEPHALEXIN 500 MG PO CAPS: 500.0000 mg | ORAL_CAPSULE | Freq: Two times a day (BID) | ORAL | 0 refills | Status: DC

## 2017-11-12 LAB — URINE CULTURE: Culture: >=100000 — AB

## 2017-11-13 ENCOUNTER — Telehealth: Payer: Self-pay | Admitting: *Deleted

## 2017-11-13 NOTE — Telephone Encounter (Signed)
Post ED Visit - Positive Culture Follow-up: Successful Patient Follow-Up  Culture assessed and recommendations reviewed by:  []  Elenor Quinones, Pharm.D. []  Heide Guile, Pharm.D., BCPS AQ-ID []  Parks Neptune, Pharm.D., BCPS []  Alycia Rossetti, Pharm.D., BCPS []  [place], Pharm.D., BCPS, AAHIVP []  Legrand Como, Pharm.D., BCPS, AAHIVP []  Salome Arnt, PharmD, BCPS []  Johnnette Gourd, PharmD, BCPS []  Hughes Better, PharmD, BCPS [x]  Leeroy Cha, PharmD  Positive urine culture, reviewed by Suella Broad, PA  []  Patient discharged without antimicrobial prescription and treatment is now indicated []  Organism is resistant to prescribed ED discharge antimicrobial []  Patient with positive blood cultures  Advised to stop antibiotics, verbalized understanding.   Harlon Flor [facility] 11/13/2017, 11:04 AM

## 2017-11-13 NOTE — Progress Notes (Signed)
ED Antimicrobial Stewardship Positive Culture Follow Up   Amanda Butler is an 62 y.o. female who presented to [facility] on 11/08/2017 with a chief complaint of wanting to be evaluated for heat exhaustion since the air conditioner in her home had been out for 3 days. She also reports some nausea and incomplete voiding. Chief Complaint  Patient presents with  . Heat Exposure    Recent Results (from the past 720 hour(s))  Urine culture     Status: Abnormal   Collection Time: 11/09/17 12:43 AM  Result Value Ref Range Status   Specimen Description   Final    URINE, RANDOM Performed at [facility] [address] [place], [place] [address]    Special Requests   Final    NONE Performed at [facility], [place] [address] [place], [place] [address]    Culture (A)  Final    >=100,000 COLONIES/mL ENTEROCOCCUS FAECALIS >=100,000 COLONIES/mL YEAST    Report Status 11/12/2017 FINAL  Final   Organism ID, Bacteria ENTEROCOCCUS FAECALIS (A)  Final      Susceptibility   Enterococcus faecalis - MIC*    AMPICILLIN <=2 SENSITIVE Sensitive     LEVOFLOXACIN 1 SENSITIVE Sensitive     NITROFURANTOIN <=16 SENSITIVE Sensitive     VANCOMYCIN 1 SENSITIVE Sensitive     * >=100,000 COLONIES/mL ENTEROCOCCUS FAECALIS    [x]  Treated with Keflex, but no treatment is indicated (asymptomatic bacteriuria) []  Patient discharged originally without antimicrobial agent and treatment is now indicated  Recommend patient stop Keflex.  ED Provider: Suella Broad, PA   Jackson Latino, PharmD PGY1 Pharmacy Resident Phone [phone] 11/13/2017     10:11 AM

## 2017-12-25 DIAGNOSIS — L03116 Cellulitis of left lower limb: Secondary | ICD-10-CM | POA: Diagnosis not present

## 2017-12-30 DIAGNOSIS — L03116 Cellulitis of left lower limb: Secondary | ICD-10-CM | POA: Diagnosis not present

## 2017-12-30 DIAGNOSIS — E1122 Type 2 diabetes mellitus with diabetic chronic kidney disease: Secondary | ICD-10-CM | POA: Diagnosis not present

## 2017-12-30 DIAGNOSIS — I129 Hypertensive chronic kidney disease with stage 1 through stage 4 chronic kidney disease, or unspecified chronic kidney disease: Secondary | ICD-10-CM | POA: Diagnosis not present

## 2018-01-01 DIAGNOSIS — L03116 Cellulitis of left lower limb: Secondary | ICD-10-CM | POA: Diagnosis not present

## 2018-01-01 DIAGNOSIS — E1122 Type 2 diabetes mellitus with diabetic chronic kidney disease: Secondary | ICD-10-CM | POA: Diagnosis not present

## 2018-01-01 DIAGNOSIS — I129 Hypertensive chronic kidney disease with stage 1 through stage 4 chronic kidney disease, or unspecified chronic kidney disease: Secondary | ICD-10-CM | POA: Diagnosis not present

## 2018-01-05 DIAGNOSIS — R809 Proteinuria, unspecified: Secondary | ICD-10-CM | POA: Diagnosis not present

## 2018-01-05 DIAGNOSIS — E1129 Type 2 diabetes mellitus with other diabetic kidney complication: Secondary | ICD-10-CM | POA: Diagnosis not present

## 2018-01-05 DIAGNOSIS — E7800 Pure hypercholesterolemia, unspecified: Secondary | ICD-10-CM | POA: Diagnosis not present

## 2018-01-05 DIAGNOSIS — I10 Essential (primary) hypertension: Secondary | ICD-10-CM | POA: Diagnosis not present

## 2018-01-06 DIAGNOSIS — E1122 Type 2 diabetes mellitus with diabetic chronic kidney disease: Secondary | ICD-10-CM | POA: Diagnosis not present

## 2018-01-06 DIAGNOSIS — I129 Hypertensive chronic kidney disease with stage 1 through stage 4 chronic kidney disease, or unspecified chronic kidney disease: Secondary | ICD-10-CM | POA: Diagnosis not present

## 2018-01-06 DIAGNOSIS — L03116 Cellulitis of left lower limb: Secondary | ICD-10-CM | POA: Diagnosis not present

## 2018-01-08 DIAGNOSIS — I129 Hypertensive chronic kidney disease with stage 1 through stage 4 chronic kidney disease, or unspecified chronic kidney disease: Secondary | ICD-10-CM | POA: Diagnosis not present

## 2018-01-08 DIAGNOSIS — E1122 Type 2 diabetes mellitus with diabetic chronic kidney disease: Secondary | ICD-10-CM | POA: Diagnosis not present

## 2018-01-08 DIAGNOSIS — L03116 Cellulitis of left lower limb: Secondary | ICD-10-CM | POA: Diagnosis not present

## 2018-01-11 DIAGNOSIS — H538 Other visual disturbances: Secondary | ICD-10-CM | POA: Diagnosis not present

## 2018-01-11 DIAGNOSIS — E119 Type 2 diabetes mellitus without complications: Secondary | ICD-10-CM | POA: Diagnosis not present

## 2018-01-11 DIAGNOSIS — H2511 Age-related nuclear cataract, right eye: Secondary | ICD-10-CM | POA: Diagnosis not present

## 2018-01-18 DIAGNOSIS — L03116 Cellulitis of left lower limb: Secondary | ICD-10-CM | POA: Diagnosis not present

## 2018-01-18 DIAGNOSIS — I129 Hypertensive chronic kidney disease with stage 1 through stage 4 chronic kidney disease, or unspecified chronic kidney disease: Secondary | ICD-10-CM | POA: Diagnosis not present

## 2018-01-18 DIAGNOSIS — E1122 Type 2 diabetes mellitus with diabetic chronic kidney disease: Secondary | ICD-10-CM | POA: Diagnosis not present

## 2018-01-22 DIAGNOSIS — L03116 Cellulitis of left lower limb: Secondary | ICD-10-CM | POA: Diagnosis not present

## 2018-01-22 DIAGNOSIS — I129 Hypertensive chronic kidney disease with stage 1 through stage 4 chronic kidney disease, or unspecified chronic kidney disease: Secondary | ICD-10-CM | POA: Diagnosis not present

## 2018-01-22 DIAGNOSIS — E1122 Type 2 diabetes mellitus with diabetic chronic kidney disease: Secondary | ICD-10-CM | POA: Diagnosis not present

## 2018-01-24 ENCOUNTER — Other Ambulatory Visit: Payer: Self-pay

## 2018-01-24 ENCOUNTER — Emergency Department (HOSPITAL_COMMUNITY): Payer: 59

## 2018-01-24 ENCOUNTER — Emergency Department (HOSPITAL_COMMUNITY)
Admission: EM | Admit: 2018-01-24 | Discharge: 2018-01-24 | Disposition: A | Discharge status: Home / Self Care | Payer: 59 | Attending: Emergency Medicine | Admitting: Emergency Medicine

## 2018-01-24 ENCOUNTER — Encounter (HOSPITAL_COMMUNITY): Payer: Self-pay | Admitting: Emergency Medicine

## 2018-01-24 DIAGNOSIS — Y999 Unspecified external cause status: Secondary | ICD-10-CM | POA: Diagnosis not present

## 2018-01-24 DIAGNOSIS — X500XXA Overexertion from strenuous movement or load, initial encounter: Secondary | ICD-10-CM | POA: Insufficient documentation

## 2018-01-24 DIAGNOSIS — J45909 Unspecified asthma, uncomplicated: Secondary | ICD-10-CM | POA: Insufficient documentation

## 2018-01-24 DIAGNOSIS — S46911A Strain of unspecified muscle, fascia and tendon at shoulder and upper arm level, right arm, initial encounter: Secondary | ICD-10-CM | POA: Diagnosis not present

## 2018-01-24 DIAGNOSIS — I509 Heart failure, unspecified: Secondary | ICD-10-CM | POA: Insufficient documentation

## 2018-01-24 DIAGNOSIS — E119 Type 2 diabetes mellitus without complications: Secondary | ICD-10-CM | POA: Insufficient documentation

## 2018-01-24 DIAGNOSIS — Z79899 Other long term (current) drug therapy: Secondary | ICD-10-CM | POA: Insufficient documentation

## 2018-01-24 DIAGNOSIS — Y929 Unspecified place or not applicable: Secondary | ICD-10-CM | POA: Diagnosis not present

## 2018-01-24 DIAGNOSIS — R202 Paresthesia of skin: Secondary | ICD-10-CM | POA: Insufficient documentation

## 2018-01-24 DIAGNOSIS — M79601 Pain in right arm: Secondary | ICD-10-CM | POA: Diagnosis not present

## 2018-01-24 DIAGNOSIS — Z91040 Latex allergy status: Secondary | ICD-10-CM | POA: Diagnosis not present

## 2018-01-24 DIAGNOSIS — I110 Hypertensive heart disease with heart failure: Secondary | ICD-10-CM | POA: Diagnosis not present

## 2018-01-24 DIAGNOSIS — Z7984 Long term (current) use of oral hypoglycemic drugs: Secondary | ICD-10-CM | POA: Diagnosis not present

## 2018-01-24 DIAGNOSIS — Y9389 Activity, other specified: Secondary | ICD-10-CM | POA: Insufficient documentation

## 2018-01-24 DIAGNOSIS — S4991XA Unspecified injury of right shoulder and upper arm, initial encounter: Secondary | ICD-10-CM | POA: Diagnosis present

## 2018-01-24 NOTE — ED Triage Notes (Signed)
Pt c/o R arm pain since last night. Pt states sudden and difficulty raising arm above shoulder. Pain in lateral upper arm / triceps. Pt does not recall injury.

## 2018-01-24 NOTE — ED Provider Notes (Signed)
[facility] DEPT Provider Note   CSN: [id number] Arrival date & time: 01/24/18  0932     History   Chief Complaint Chief Complaint  Patient presents with  . Arm Pain    R    HPI Amanda Butler is a 62 y.o. female.  HPI Patient presents with right shoulder pain.  Began yesterday evening.  States she thinks it is from when she reached and carried a heavy folder of her daughter's.  Has really not had problems in the shoulder before but had a previous fracture of the left shoulder.  States it hurts more she attempts to raise her arm above horizontal.  States she is been moving around some to try and keep it from freezing.  States she has chronic pain in her left shoulder.  No fevers.  States there was some tingling in the right hand but that is resolved.  Mild pain up in the neck.  No chest pain or trouble breathing. Past Medical History:  Diagnosis Date  . Arthritis    "knees" (09/13/2015)  . Asthma   . Cellulitis   . Chronic diastolic CHF (congestive heart failure) ([place])    a. 09/2015 Echo: EF 55-60%, mild LVH, gr2DD, no rwma, mild MR, mildly dil LA.   Marland Kitchen GERD (gastroesophageal reflux disease)   . High cholesterol   . Hypertension   . Midsternal chest pain    a. 09/2015 MV: no ischemia/infarct, EF 61%; b. 09/2015 Low prob V:Q scan.  . Morbid obesity ([place])   . Obesity   . Pneumonia ~ 2000   "mild case"  . RBBB (right bundle branch block) approx 2010   Dr Marisue Humble did ECG and told her  . Type II diabetes mellitus [facility])     Patient Active Problem List   Diagnosis Date Noted  . Cellulitis 10/02/2017  . 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  . Closed nondisplaced fracture of greater tuberosity of left humerus 04/23/2017  . Hypertension   . Type II diabetes mellitus ([place])   . High cholesterol   . Morbid obesity ([place])   . Chronic diastolic CHF (congestive heart failure) ([place])   . Midsternal chest pain   . Pain in  the chest   . Essential hypertension   . Morbid obesity due to excess calories ([place])   . Chest pain 09/13/2015  . Bilateral lower extremity edema 09/13/2015  . Diabetes mellitus type 2, diet-controlled ([place]) 09/13/2015  . Obesity 09/13/2015  . Total bilirubin, elevated 09/13/2015  . HTN (hypertension) 09/13/2015  . Stasis dermatitis of both legs 09/13/2015  . Left leg cellulitis 09/13/2015  . RBBB 09/13/2015  . Asthma 09/13/2015  . GERD (gastroesophageal reflux disease) 09/13/2015  . 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    Past Surgical History:  Procedure Laterality Date  . BUNIONECTOMY Left   . COLONOSCOPY WITH PROPOFOL N/A 09/23/2016   Procedure: COLONOSCOPY WITH PROPOFOL;  Surgeon: Garlan Fair, MD;  Location: [facility];  Service: Endoscopy;  Laterality: N/A;  . MULTIPLE TOOTH EXTRACTIONS  ~ 1975  . TENDON RELEASE Right    Leg  . TUBAL LIGATION  ~ 1983     OB History   None      Home Medications    Prior to Admission medications   Medication Sig Start Date End Date Taking? Authorizing Provider  acetaminophen (TYLENOL) 500 MG tablet Take 1,000 mg by mouth every 6 (six) hours as needed for mild pain or headache.  [provider]  albuterol (PROVENTIL HFA;VENTOLIN HFA) 108 (90 BASE) MCG/ACT inhaler Inhale 2 puffs into the lungs 2 (two) times daily as needed for wheezing or shortness of breath.     [provider]  albuterol (PROVENTIL) (2.5 MG/3ML) 0.083% nebulizer solution Take 2.5 mg by nebulization every 6 (six) hours as needed for wheezing or shortness of breath.    [provider]  cephALEXin (KEFLEX) 500 MG capsule Take 1 capsule (500 mg total) by mouth 2 (two) times daily. 11/09/17   Molpus, John, MD  famotidine (PEPCID) 10 MG tablet Take 10 mg by mouth daily as needed for heartburn.     [provider]  hydrocortisone 1 % ointment Apply topically 2 (two) times daily. 10/04/17   Emokpae, Courage, MD  JANUVIA 100 MG tablet Take  100 mg by mouth daily. 07/02/16   [provider]  lisinopril (PRINIVIL,ZESTRIL) 10 MG tablet Take 1 tablet (10 mg total) by mouth daily. 07/08/16   Croitoru, Mihai, MD  metoprolol succinate (TOPROL-XL) 50 MG 24 hr tablet Take 50 mg by mouth daily. Take with or immediately following a meal.    [provider]  mometasone (NASONEX) 50 MCG/ACT nasal spray Place 2 sprays into the nose daily.     [provider]  pioglitazone (ACTOS) 30 MG tablet Take 30 mg by mouth daily. 07/25/17   [provider]  pravastatin (PRAVACHOL) 20 MG tablet Take 20 mg by mouth daily.    [provider]  triamterene-hydrochlorothiazide (MAXZIDE) 75-50 MG per tablet Take 1 tablet by mouth daily.    [provider]    Family History Family History  Problem Relation Age of Onset  . Diabetes Mother   . Hypertension Mother   . Diabetes Father   . Hypertension Father   . Heart attack Father 40  . Diabetes Maternal Aunt   . Diabetes Maternal Grandmother   . Hypertension Maternal Grandmother   . Cancer Maternal Grandfather   . Diabetes Maternal Grandfather   . Hypertension Maternal Grandfather   . Asthma Paternal Grandmother   . Diabetes Paternal Grandmother   . Hypertension Paternal Grandmother   . Diabetes Paternal Grandfather   . Hypertension Paternal Grandfather   . Heart failure Paternal Grandfather     Social History Social History   Tobacco Use  . Smoking status: Never Smoker  . Smokeless tobacco: Never Used  Substance Use Topics  . Alcohol use: No  . Drug use: No     Allergies   Bee venom; Iodine; Shellfish allergy; Aspirin; Latex; Metformin and related; Morphine and related; Nsaids; and Ciprofloxacin   Review of Systems Review of Systems  Constitutional: Negative for appetite change.  Respiratory: Negative for shortness of breath.   Cardiovascular: Negative for chest pain.  Musculoskeletal: Negative for neck pain.       Right shoulder  pain  Skin: Negative for rash.  Neurological: Positive for numbness.     Physical Exam Updated Vital Signs BP (!) 122/105 (BP Location: Left Arm)   Pulse 82   Temp 98.7 F (37.1 C) (Oral)   Resp 16   SpO2 97%   Physical Exam  Constitutional: She appears well-developed.  Patient is obese  HENT:  Head: Atraumatic.  Neck:  No bony tenderness but some pain over trapezius on right side.  Cardiovascular: Normal rate.  Pulmonary/Chest: She has no wheezes. She has no rales.  Musculoskeletal: She exhibits tenderness.  Mild tenderness of her right shoulder laterally.  Pain with  active range of motion above horizontal.  Less pain with passive movement.  No pain with elbow movement.  Neurovascular intact in right hand.  Radial pulse is strong.     ED Treatments / Results  Labs (all labs ordered are listed, but only abnormal results are displayed) Labs Reviewed - No data to display  EKG None  Radiology Dg Shoulder Right  Result Date: 01/24/2018 CLINICAL DATA:  Right arm pain EXAM: RIGHT SHOULDER - 2+ VIEW COMPARISON:  None. FINDINGS: Normal alignment no fracture. AC degenerative change with joint space narrowing and spurring. No focal bony abnormality. IMPRESSION: AC degenerative change.  No acute abnormality. Electronically Signed   By: Franchot Gallo M.D.   On: 01/24/2018 10:40    Procedures Procedures (including critical care time)  Medications Ordered in ED Medications - No data to display   Initial Impression / Assessment and Plan / ED Course  I have reviewed the triage vital signs and the nursing notes.  Pertinent labs & imaging results that were available during my care of the patient were reviewed by me and considered in my medical decision making (see chart for details).     Patient with right shoulder pain.  X-ray overall reassuring.  Doubt fracture.  Has seen Dr. Erlinda Hong from orthopedic before and would like to follow-up with someone else potentially.  Will  discharge home.  Will continue her range of motion exercises per  Final Clinical Impressions(s) / ED Diagnoses   Final diagnoses:  Strain of right shoulder, initial encounter    ED Discharge Orders    None       Davonna Belling, MD 01/24/18 1140

## 2018-01-26 DIAGNOSIS — E1122 Type 2 diabetes mellitus with diabetic chronic kidney disease: Secondary | ICD-10-CM | POA: Diagnosis not present

## 2018-01-26 DIAGNOSIS — L03116 Cellulitis of left lower limb: Secondary | ICD-10-CM | POA: Diagnosis not present

## 2018-01-26 DIAGNOSIS — I129 Hypertensive chronic kidney disease with stage 1 through stage 4 chronic kidney disease, or unspecified chronic kidney disease: Secondary | ICD-10-CM | POA: Diagnosis not present

## 2018-01-29 DIAGNOSIS — E1122 Type 2 diabetes mellitus with diabetic chronic kidney disease: Secondary | ICD-10-CM | POA: Diagnosis not present

## 2018-01-29 DIAGNOSIS — I129 Hypertensive chronic kidney disease with stage 1 through stage 4 chronic kidney disease, or unspecified chronic kidney disease: Secondary | ICD-10-CM | POA: Diagnosis not present

## 2018-01-29 DIAGNOSIS — L03116 Cellulitis of left lower limb: Secondary | ICD-10-CM | POA: Diagnosis not present

## 2018-02-02 DIAGNOSIS — I129 Hypertensive chronic kidney disease with stage 1 through stage 4 chronic kidney disease, or unspecified chronic kidney disease: Secondary | ICD-10-CM | POA: Diagnosis not present

## 2018-02-02 DIAGNOSIS — E1122 Type 2 diabetes mellitus with diabetic chronic kidney disease: Secondary | ICD-10-CM | POA: Diagnosis not present

## 2018-02-02 DIAGNOSIS — L03116 Cellulitis of left lower limb: Secondary | ICD-10-CM | POA: Diagnosis not present

## 2018-02-05 DIAGNOSIS — E1122 Type 2 diabetes mellitus with diabetic chronic kidney disease: Secondary | ICD-10-CM | POA: Diagnosis not present

## 2018-02-05 DIAGNOSIS — I129 Hypertensive chronic kidney disease with stage 1 through stage 4 chronic kidney disease, or unspecified chronic kidney disease: Secondary | ICD-10-CM | POA: Diagnosis not present

## 2018-02-05 DIAGNOSIS — L03116 Cellulitis of left lower limb: Secondary | ICD-10-CM | POA: Diagnosis not present

## 2018-02-09 DIAGNOSIS — E1122 Type 2 diabetes mellitus with diabetic chronic kidney disease: Secondary | ICD-10-CM | POA: Diagnosis not present

## 2018-02-09 DIAGNOSIS — L03116 Cellulitis of left lower limb: Secondary | ICD-10-CM | POA: Diagnosis not present

## 2018-02-09 DIAGNOSIS — I129 Hypertensive chronic kidney disease with stage 1 through stage 4 chronic kidney disease, or unspecified chronic kidney disease: Secondary | ICD-10-CM | POA: Diagnosis not present

## 2018-02-12 DIAGNOSIS — I129 Hypertensive chronic kidney disease with stage 1 through stage 4 chronic kidney disease, or unspecified chronic kidney disease: Secondary | ICD-10-CM | POA: Diagnosis not present

## 2018-02-12 DIAGNOSIS — L03116 Cellulitis of left lower limb: Secondary | ICD-10-CM | POA: Diagnosis not present

## 2018-02-12 DIAGNOSIS — E1122 Type 2 diabetes mellitus with diabetic chronic kidney disease: Secondary | ICD-10-CM | POA: Diagnosis not present

## 2018-02-15 ENCOUNTER — Encounter: Payer: Self-pay | Admitting: Cardiovascular Disease

## 2018-02-15 ENCOUNTER — Ambulatory Visit (INDEPENDENT_AMBULATORY_CARE_PROVIDER_SITE_OTHER): Payer: 59 | Admitting: Cardiovascular Disease

## 2018-02-15 VITALS — BP 194/86 | HR 83 | Ht 66.0 in | Wt 275.0 lb

## 2018-02-15 DIAGNOSIS — E669 Obesity, unspecified: Secondary | ICD-10-CM

## 2018-02-15 DIAGNOSIS — E7800 Pure hypercholesterolemia, unspecified: Secondary | ICD-10-CM

## 2018-02-15 DIAGNOSIS — L03116 Cellulitis of left lower limb: Secondary | ICD-10-CM | POA: Diagnosis not present

## 2018-02-15 DIAGNOSIS — R531 Weakness: Secondary | ICD-10-CM

## 2018-02-15 DIAGNOSIS — I5032 Chronic diastolic (congestive) heart failure: Secondary | ICD-10-CM | POA: Diagnosis not present

## 2018-02-15 DIAGNOSIS — I10 Essential (primary) hypertension: Secondary | ICD-10-CM

## 2018-02-15 DIAGNOSIS — I129 Hypertensive chronic kidney disease with stage 1 through stage 4 chronic kidney disease, or unspecified chronic kidney disease: Secondary | ICD-10-CM | POA: Diagnosis not present

## 2018-02-15 DIAGNOSIS — E1122 Type 2 diabetes mellitus with diabetic chronic kidney disease: Secondary | ICD-10-CM | POA: Diagnosis not present

## 2018-02-15 DIAGNOSIS — E1169 Type 2 diabetes mellitus with other specified complication: Secondary | ICD-10-CM

## 2018-02-15 DIAGNOSIS — R5381 Other malaise: Secondary | ICD-10-CM

## 2018-02-15 DIAGNOSIS — Z9181 History of falling: Secondary | ICD-10-CM

## 2018-02-15 NOTE — Progress Notes (Signed)
Cardiology Office Note    Date:  02/16/2018   ID:  Amanda Butler, DOB [birth year], MRN [id number]  PCP:  Gaynelle Arabian, MD  Cardiologist:   Sanda Klein, MD   Chief Complaint  Patient presents with  . Follow-up    CHF    History of Present Illness:  Amanda Butler is a 62 y.o. female with chronic diastolic heart failure, morbid obesity, hypertension, hyperlipidemia and diabetes mellitus with normal left ventricular systolic function, left ventricular hypertrophy and evidence of elevated filling pressures by echo.  Since the beginning of the year her daughter is living in a care facility, which has alleviated the need for increased physical work, but still provides her with a lot of stress and guilt.  She spends almost all of her time alone at home.  She would like to get out of engage in some type of physical training activity and social engagement.  She did better when she was receiving physical therapy but this has elapsed.  She still feels very unsteady on her feet even when she was at a walker.  She has had several falls, thankfully without serious injury.  Over the summer glycemic control was better and her hemoglobin A1c was down to 7.1%.  I September glycemic control but again deteriorated with an A1c of 8.1% and substantial weight gain.  Her typical blood pressure at home is around 130/70s.  She has not had any focal neurological complaints and has not had palpitations or syncope.  She has no angina at rest or with activity.  She has chronic leg edema.  Past Medical History:  Diagnosis Date  . Arthritis    "knees" (09/13/2015)  . Asthma   . Cellulitis   . Chronic diastolic CHF (congestive heart failure) ([place])    a. 09/2015 Echo: EF 55-60%, mild LVH, gr2DD, no rwma, mild MR, mildly dil LA.   Marland Kitchen GERD (gastroesophageal reflux disease)   . High cholesterol   . Hypertension   . Midsternal chest pain    a. 09/2015 MV: no ischemia/infarct, EF 61%; b. 09/2015 Low prob V:Q scan.    . Morbid obesity ([place])   . Obesity   . Pneumonia ~ 2000   "mild case"  . RBBB (right bundle branch block) approx 2010   Dr Marisue Humble did ECG and told her  . Type II diabetes mellitus ([place])     Past Surgical History:  Procedure Laterality Date  . BUNIONECTOMY Left   . COLONOSCOPY WITH PROPOFOL N/A 09/23/2016   Procedure: COLONOSCOPY WITH PROPOFOL;  Surgeon: Garlan Fair, MD;  Location: [facility];  Service: Endoscopy;  Laterality: N/A;  . MULTIPLE TOOTH EXTRACTIONS  ~ 1975  . TENDON RELEASE Right    Leg  . TUBAL LIGATION  ~ 1983    Current Medications: Outpatient Medications Prior to Visit  Medication Sig Dispense Refill  . acetaminophen (TYLENOL) 500 MG tablet Take 1,000 mg by mouth every 6 (six) hours as needed for mild pain or headache.    . albuterol (PROVENTIL HFA;VENTOLIN HFA) 108 (90 BASE) MCG/ACT inhaler Inhale 2 puffs into the lungs 2 (two) times daily as needed for wheezing or shortness of breath.     Marland Kitchen albuterol (PROVENTIL) (2.5 MG/3ML) 0.083% nebulizer solution Take 2.5 mg by nebulization every 6 (six) hours as needed for wheezing or shortness of breath.    . cephALEXin (KEFLEX) 500 MG capsule Take 1 capsule (500 mg total) by mouth 2 (two) times daily. 10 capsule 0  .  famotidine (PEPCID) 10 MG tablet Take 10 mg by mouth daily as needed for heartburn.     . hydrocortisone 1 % ointment Apply topically 2 (two) times daily. 30 g 0  . JANUVIA 100 MG tablet Take 100 mg by mouth daily.  12  . lisinopril (PRINIVIL,ZESTRIL) 10 MG tablet Take 1 tablet (10 mg total) by mouth daily. 90 tablet 1  . metoprolol succinate (TOPROL-XL) 50 MG 24 hr tablet Take 50 mg by mouth daily. Take with or immediately following a meal.    . mometasone (NASONEX) 50 MCG/ACT nasal spray Place 2 sprays into the nose daily.     . pioglitazone (ACTOS) 30 MG tablet Take 30 mg by mouth daily.  3  . pravastatin (PRAVACHOL) 20 MG tablet Take 20 mg by mouth daily.    Marland Kitchen triamterene-hydrochlorothiazide  (MAXZIDE) 75-50 MG per tablet Take 1 tablet by mouth daily.     No facility-administered medications prior to visit.      Allergies:   Bee venom; Iodine; Shellfish allergy; Aspirin; Latex; Metformin and related; Morphine and related; Nsaids; and Ciprofloxacin   Social History   Socioeconomic History  . Marital status: Widowed    Spouse name: Not on file  . Number of children: Not on file  . Years of education: Not on file  . Highest education level: Not on file  Occupational History  . 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 Financial resource strain: Not on file  . Food insecurity:    Worry: Not on file    Inability: Not on file  . Transportation needs:    Medical: Not on file    Non-medical: Not on file  Tobacco Use  . Smoking status: Never Smoker  . Smokeless tobacco: Never Used  Substance and Sexual Activity  . Alcohol use: No  . Drug use: No  . Sexual activity: Never  Lifestyle  . Physical activity:    Days per week: Not on file    Minutes per session: Not on file  . Stress: Not on file  Relationships  . Social connections:    Talks on phone: Not on file    Gets together: Not on file    Attends religious service: Not on file    Active member of club or organization: Not on file    Attends meetings of clubs or organizations: Not on file    Relationship status: Not on file  Other Topics Concern  . Not on file  Social History Narrative   Pt lives with disabled daughter, is her primary caregiver.     Family History:  The patient's family history includes Asthma in her paternal grandmother; Cancer in her maternal grandfather; Diabetes in her father, maternal aunt, maternal grandfather, maternal grandmother, mother, paternal grandfather, and paternal grandmother; Heart attack (age of onset: 70) in her father; Heart failure in her paternal grandfather; Hypertension in her father, maternal grandfather, maternal grandmother, mother, paternal  grandfather, and paternal grandmother.  Mother had a pacemaker at age 90 ROS:   Please see the history of present illness.    ROS All other systems reviewed and are negative.   PHYSICAL EXAM:   VS:  BP (!) 194/86   Pulse 83   Ht 5' 6" (1.676 m)   Wt 275 lb (124.7 kg)   BMI 44.39 kg/m       General: Alert, oriented x3, no distress, morbidly obese Head: no evidence of trauma, PERRL, EOMI, no exophtalmos or  lid lag, no myxedema, no xanthelasma; normal ears, nose and oropharynx Neck: normal jugular venous pulsations and no hepatojugular reflux; brisk carotid pulses without delay and no carotid bruits Chest: clear to auscultation, no signs of consolidation by percussion or palpation, normal fremitus, symmetrical and full respiratory excursions Cardiovascular: normal position and quality of the apical impulse, regular rhythm, normal first and second heart sounds, no murmurs, rubs or gallops Abdomen: no tenderness or distention, no masses by palpation, no abnormal pulsatility or arterial bruits, normal bowel sounds, no hepatosplenomegaly Extremities: no clubbing, cyanosis or edema; 2+ radial, ulnar and brachial pulses bilaterally; 2+ right femoral, posterior tibial and dorsalis pedis pulses; 2+ left femoral, posterior tibial and dorsalis pedis pulses; no subclavian or femoral bruits Neurological: grossly nonfocal Psych: Normal mood and affect   Wt Readings from Last 3 Encounters:  02/15/18 275 lb (124.7 kg)  10/04/17 251 lb 8 oz (114.1 kg)  01/20/17 266 lb (120.7 kg)      Studies/Labs Reviewed:   EKG:  EKG ordered today shows NSR, RBBB (old, QRS 120 ms), QTC 474 ms Recent Labs: 10/02/2017: ALT 17 11/08/2017: BUN 22; Creatinine, Ser 1.00; Hemoglobin 15.2; Platelets 201; Potassium 3.9; Sodium 147   Lipid Panel    Component Value Date/Time   CHOL 165 09/14/2015 0430   TRIG 82 09/14/2015 0430   HDL 38 (L) 09/14/2015 0430   CHOLHDL 4.3 09/14/2015 0430   VLDL 16 09/14/2015 0430    LDLCALC 111 (H) 09/14/2015 0430   Labs from January 05, 2018 from Dr. Andrew Au office  Total cholesterol 168, HDL 47, LDL 101, triglycerides 97 Hemoglobin A1c 8.1%, creatinine 0.9, potassium 4.1, normal liver function tests  ASSESSMENT:    1. Chronic diastolic heart failure ([place])   2. Essential hypertension   3. Hypercholesterolemia   4. Diabetes mellitus type 2 in obese ([place])   5. Physical deconditioning   6. Weakness   7. At risk for injury related to fall      PLAN:  In order of problems listed above:  1. CHF: Reasonably well compensated, probably euvolemic from a left heart failure point of view, very sedentary so hard to assess functional status but appears to be class II. Appears to be secondary to obesity, deconditioning, previously untreated hypertension with secondary LVH.  Previous ischemic work-up was negative.  Her true weight has varied a lot so it is hard to assess her "dry weight" but she appears to be close to that today.  I am a little concerned that she is taking alk phos of her diabetes since this may cause volume retention, but right now this does not appear to be a big deal.  If swelling/heart failure worsens would recommend switching from Actos to an SGLT2 inhibitor such as Jardiance.  Obviously, this will be more expensive.  May have to apply for pharmaceutical manufacturer assistance. 2. HTN: Her blood pressure was severely elevated when she first camie in, but she reports being very anxious.  I rechecked her blood pressure was 139/79.  She reports that her typical systolic blood pressure at home is 30.  No changes made to her medications. 3. HLP: Right at upper limit of target LDL 100 4. DM/Obesity: Weight loss strongly encouraged and discussed in a lot of detail. She does not have a sleep study. 5. Deconditioning: Recommend physical therapy for weakness, unstable gait and falls.    Medication Adjustments/Labs and Tests Ordered: Current medicines are reviewed  at length with the patient today.  Concerns regarding medicines  are outlined above.  Medication changes, Labs and Tests ordered today are listed in the Patient Instructions below. Patient Instructions  Medication Instructions:  Dr Sallyanne Kuster recommends that you continue on your current medications as directed. Please refer to the Current Medication list given to you today.  If you need a refill on your cardiac medications before your next appointment, please call your pharmacy.   Follow-Up: At [facility], you and your health needs are our priority.  As part of our continuing mission to provide you with exceptional heart care, we have created designated Provider Care Teams.  These Care Teams include your primary Cardiologist (physician) and Advanced Practice Providers (APPs -  Physician Assistants and Nurse Practitioners) who all work together to provide you with the care you need, when you need it. You will need a follow up appointment in 12 months.  Please call our office 2 months in advance to schedule this appointment.  You may see Sanda Klein, MD or one of the following Advanced Practice Providers on your designated Care Team: [place], [place] . Fabian Sharp, PA-C  Dr Sallyanne Kuster has ordered physical therapy. They will contact you to schedule an appointment.  You have been referred to your [facility] She will be in touch with you to schedule an appointment as well.    Signed, Sanda Klein, MD  02/16/2018 8:27 AM    [address], [place], [place]  [address] Phone: [phone]; Fax: [phone]

## 2018-02-15 NOTE — Patient Instructions (Signed)
Medication Instructions:  Dr Sallyanne Kuster recommends that you continue on your current medications as directed. Please refer to the Current Medication list given to you today.  If you need a refill on your cardiac medications before your next appointment, please call your pharmacy.   Follow-Up: At [facility], you and your health needs are our priority.  As part of our continuing mission to provide you with exceptional heart care, we have created designated Provider Care Teams.  These Care Teams include your primary Cardiologist (physician) and Advanced Practice Providers (APPs -  Physician Assistants and Nurse Practitioners) who all work together to provide you with the care you need, when you need it. You will need a follow up appointment in 12 months.  Please call our office 2 months in advance to schedule this appointment.  You may see Sanda Klein, MD or one of the following Advanced Practice Providers on your designated Care Team: [place], [place] . Fabian Sharp, PA-C  Dr Sallyanne Kuster has ordered physical therapy. They will contact you to schedule an appointment.  You have been referred to your [facility]. She will be in touch with you to schedule an appointment as well.

## 2018-02-16 ENCOUNTER — Encounter: Payer: Self-pay | Admitting: Cardiovascular Disease

## 2018-02-19 ENCOUNTER — Other Ambulatory Visit: Payer: Self-pay

## 2018-02-19 ENCOUNTER — Encounter (HOSPITAL_COMMUNITY): Payer: Self-pay | Admitting: Emergency Medicine

## 2018-02-19 ENCOUNTER — Observation Stay (HOSPITAL_COMMUNITY)
Admission: EM | Admit: 2018-02-19 | Discharge: 2018-02-21 | Disposition: A | Discharge status: Home / Self Care | Payer: 59 | Attending: Family Medicine | Admitting: Family Medicine

## 2018-02-19 ENCOUNTER — Emergency Department (HOSPITAL_COMMUNITY): Payer: 59

## 2018-02-19 DIAGNOSIS — L8990 Pressure ulcer of unspecified site, unspecified stage: Secondary | ICD-10-CM

## 2018-02-19 DIAGNOSIS — Z886 Allergy status to analgesic agent status: Secondary | ICD-10-CM | POA: Diagnosis not present

## 2018-02-19 DIAGNOSIS — E119 Type 2 diabetes mellitus without complications: Secondary | ICD-10-CM | POA: Diagnosis not present

## 2018-02-19 DIAGNOSIS — I872 Venous insufficiency (chronic) (peripheral): Secondary | ICD-10-CM | POA: Diagnosis present

## 2018-02-19 DIAGNOSIS — K219 Gastro-esophageal reflux disease without esophagitis: Secondary | ICD-10-CM | POA: Diagnosis not present

## 2018-02-19 DIAGNOSIS — R079 Chest pain, unspecified: Secondary | ICD-10-CM | POA: Diagnosis present

## 2018-02-19 DIAGNOSIS — I110 Hypertensive heart disease with heart failure: Secondary | ICD-10-CM | POA: Diagnosis not present

## 2018-02-19 DIAGNOSIS — Z888 Allergy status to other drugs, medicaments and biological substances status: Secondary | ICD-10-CM | POA: Insufficient documentation

## 2018-02-19 DIAGNOSIS — I5032 Chronic diastolic (congestive) heart failure: Secondary | ICD-10-CM | POA: Diagnosis present

## 2018-02-19 DIAGNOSIS — I10 Essential (primary) hypertension: Secondary | ICD-10-CM | POA: Diagnosis present

## 2018-02-19 DIAGNOSIS — E11622 Type 2 diabetes mellitus with other skin ulcer: Secondary | ICD-10-CM | POA: Insufficient documentation

## 2018-02-19 DIAGNOSIS — Z79899 Other long term (current) drug therapy: Secondary | ICD-10-CM | POA: Insufficient documentation

## 2018-02-19 DIAGNOSIS — Z885 Allergy status to narcotic agent status: Secondary | ICD-10-CM | POA: Diagnosis not present

## 2018-02-19 DIAGNOSIS — Z881 Allergy status to other antibiotic agents status: Secondary | ICD-10-CM | POA: Insufficient documentation

## 2018-02-19 DIAGNOSIS — R072 Precordial pain: Secondary | ICD-10-CM | POA: Diagnosis not present

## 2018-02-19 DIAGNOSIS — E11620 Type 2 diabetes mellitus with diabetic dermatitis: Secondary | ICD-10-CM

## 2018-02-19 DIAGNOSIS — Z7984 Long term (current) use of oral hypoglycemic drugs: Secondary | ICD-10-CM | POA: Insufficient documentation

## 2018-02-19 DIAGNOSIS — E7800 Pure hypercholesterolemia, unspecified: Secondary | ICD-10-CM | POA: Diagnosis not present

## 2018-02-19 DIAGNOSIS — J45909 Unspecified asthma, uncomplicated: Secondary | ICD-10-CM | POA: Diagnosis not present

## 2018-02-19 DIAGNOSIS — L89302 Pressure ulcer of unspecified buttock, stage 2: Secondary | ICD-10-CM | POA: Diagnosis not present

## 2018-02-19 DIAGNOSIS — R0789 Other chest pain: Secondary | ICD-10-CM | POA: Diagnosis not present

## 2018-02-19 DIAGNOSIS — R05 Cough: Secondary | ICD-10-CM | POA: Diagnosis not present

## 2018-02-19 LAB — TROPONIN I
Troponin I: <0.03 ng/mL (ref <0.03)
Troponin I: <0.03 ng/mL (ref <0.03)

## 2018-02-19 LAB — BASIC METABOLIC PANEL
Anion gap: 12 (ref 5–15)
BUN: 17 mg/dL (ref 8–23)
CO2: 26 mmol/L (ref 22–32)
Calcium: 9.3 mg/dL (ref 8.9–10.3)
Chloride: 101 mmol/L (ref 98–111)
Creatinine, Ser: 1.01 mg/dL — ABNORMAL HIGH (ref 0.44–1.00)
GFR calc Af Amer: >60 mL/min (ref >60)
GFR calc non Af Amer: 58 mL/min — ABNORMAL LOW (ref >60)
Glucose, Bld: 200 mg/dL — ABNORMAL HIGH (ref 70–99)
Potassium: 3.7 mmol/L (ref 3.5–5.1)
Sodium: 139 mmol/L (ref 135–145)

## 2018-02-19 LAB — CBC
HCT: 46 % (ref 36.0–46.0)
Hemoglobin: 15.2 g/dL — ABNORMAL HIGH (ref 12.0–15.0)
MCH: 29.9 pg (ref 26.0–34.0)
MCHC: 33 g/dL (ref 30.0–36.0)
MCV: 90.4 fL (ref 80.0–100.0)
Platelets: 229 10*3/uL (ref 150–400)
RBC: 5.09 MIL/uL (ref 3.87–5.11)
RDW: 13.2 % (ref 11.5–15.5)
WBC: 10.7 10*3/uL — ABNORMAL HIGH (ref 4.0–10.5)
nRBC: 0 % (ref 0.0–0.2)

## 2018-02-19 LAB — D-DIMER, QUANTITATIVE: D-Dimer, Quant: 0.7 ug/mL-FEU — ABNORMAL HIGH (ref 0.00–0.50)

## 2018-02-19 LAB — GLUCOSE, CAPILLARY: Glucose-Capillary: 189 mg/dL — ABNORMAL HIGH (ref 70–99)

## 2018-02-19 LAB — LIPASE, BLOOD: Lipase: 33 U/L (ref 11–51)

## 2018-02-19 LAB — BRAIN NATRIURETIC PEPTIDE: B Natriuretic Peptide: 88 pg/mL (ref 0.0–100.0)

## 2018-02-19 LAB — I-STAT TROPONIN, ED: Troponin i, poc: 0 ng/mL (ref 0.00–0.08)

## 2018-02-19 MED ORDER — HYDRALAZINE HCL 25 MG PO TABS: 25.0000 mg | ORAL_TABLET | Freq: Four times a day (QID) | ORAL | Status: DC | PRN

## 2018-02-19 MED ORDER — ALBUTEROL SULFATE (2.5 MG/3ML) 0.083% IN NEBU
2.5000 mg | INHALATION_SOLUTION | Freq: Four times a day (QID) | RESPIRATORY_TRACT | Status: DC | PRN
  Administered 2018-02-19: 2.5 mg via RESPIRATORY_TRACT
  Filled 2018-02-19: qty 3

## 2018-02-19 MED ORDER — TRIAMTERENE-HCTZ 75-50 MG PO TABS
1.0000 | ORAL_TABLET | Freq: Every day | ORAL | Status: DC
  Administered 2018-02-20 – 2018-02-21 (×2): 1 via ORAL
  Filled 2018-02-19 (×2): qty 1

## 2018-02-19 MED ORDER — ACETAMINOPHEN 325 MG PO TABS: 650.0000 mg | ORAL_TABLET | ORAL | Status: DC | PRN

## 2018-02-19 MED ORDER — ENOXAPARIN SODIUM 60 MG/0.6ML ~~LOC~~ SOLN
60.0000 mg | SUBCUTANEOUS | Status: DC
  Administered 2018-02-19 – 2018-02-20 (×2): 60 mg via SUBCUTANEOUS
  Filled 2018-02-19 (×2): qty 0.6

## 2018-02-19 MED ORDER — INSULIN ASPART 100 UNIT/ML ~~LOC~~ SOLN
0.0000 [IU] | Freq: Three times a day (TID) | SUBCUTANEOUS | Status: DC
  Administered 2018-02-20 – 2018-02-21 (×3): 3 [IU] via SUBCUTANEOUS

## 2018-02-19 MED ORDER — LINAGLIPTIN 5 MG PO TABS
5.0000 mg | ORAL_TABLET | Freq: Every day | ORAL | Status: DC
  Administered 2018-02-20 – 2018-02-21 (×2): 5 mg via ORAL
  Filled 2018-02-19 (×2): qty 1

## 2018-02-19 MED ORDER — NITROGLYCERIN 0.4 MG SL SUBL: 0.4000 mg | SUBLINGUAL_TABLET | SUBLINGUAL | Status: DC | PRN

## 2018-02-19 MED ORDER — PRAVASTATIN SODIUM 20 MG PO TABS
20.0000 mg | ORAL_TABLET | Freq: Every day | ORAL | Status: DC
  Administered 2018-02-20 – 2018-02-21 (×2): 20 mg via ORAL
  Filled 2018-02-19 (×2): qty 1

## 2018-02-19 MED ORDER — LISINOPRIL 10 MG PO TABS
10.0000 mg | ORAL_TABLET | Freq: Every day | ORAL | Status: DC
  Administered 2018-02-20 – 2018-02-21 (×2): 10 mg via ORAL
  Filled 2018-02-19 (×2): qty 1

## 2018-02-19 MED ORDER — INSULIN ASPART 100 UNIT/ML ~~LOC~~ SOLN
0.0000 [IU] | Freq: Every day | SUBCUTANEOUS | Status: DC
  Administered 2018-02-20: 2 [IU] via SUBCUTANEOUS

## 2018-02-19 MED ORDER — FAMOTIDINE 20 MG PO TABS: 10.0000 mg | ORAL_TABLET | Freq: Every day | ORAL | Status: DC | PRN

## 2018-02-19 MED ORDER — FLUTICASONE PROPIONATE 50 MCG/ACT NA SUSP
1.0000 | Freq: Every day | NASAL | Status: DC
  Administered 2018-02-20 – 2018-02-21 (×2): 1 via NASAL
  Filled 2018-02-19: qty 16

## 2018-02-19 MED ORDER — ONDANSETRON HCL 4 MG/2ML IJ SOLN: 4.0000 mg | Freq: Four times a day (QID) | INTRAMUSCULAR | Status: DC | PRN

## 2018-02-19 NOTE — H&P (Signed)
History and Physical  Patient Name: Amanda Butler     LNL:[id number]    DOB: [birth year]    DOA: 02/19/2018 PCP: Gaynelle Arabian, MD   Patient coming from: Home     Chief Complaint: Chest pain  HPI: Amanda Butler is a 62 y.o. female with dCHF, HTN, DM and morbid obesity who presents with chest pain.  The patient was in her usual state of health until the last few days when she started to feel dyspnea or fatigue with exertion more than usual.  This was without exertional chest pain, overt leg swelling, change in her chronic orthopnea, nor PND.    Then tonight, she was walking out of [employer] when she had sudden onset substernal chest pressure, associated with vomiting, pre-syncope and coughing.  She went to her car to rest, and this slowly subsided over about 20-30 minutes as she drove herself to the ER.    ED course: - Afebrile, heart rate 68, respirations pulse ox normal, blood pressure 146/53 -Initial ECG showed old right bundle branch block, no ST changes and troponin was negative. -Na 139, K 3.7, Cr 1.0 (baseline 1.0), WBC 10.7, Hgb 15.2 - She has an aspirin allergy, so this was not given -TRH was asked to admit for observation, serial troponins and risk stratification.   She has diabetes controlled with oral agents, last hemoglobin A1c was in June and was 7.1%.  Father with MI at age 69. She is on pravastatin, metoprolol, and diuretics.  Her blood pressure at her last cardiology appointment 4 days ago was in the 190s initially, came down to 140/90 before she left.  She describes a previous "heart episode" and "being in the cath lab", but she has never had left heart cath in our system, and all I can see is that she had a nuclear study in 2017 that showed no infarction or reversible territory.        Review of Systems:  Review of Systems  Constitutional: Positive for malaise/fatigue. Negative for chills and fever.  Respiratory: Positive for cough. Negative for hemoptysis,  sputum production, shortness of breath and wheezing.   Cardiovascular: Positive for chest pain. Negative for palpitations, orthopnea, claudication, leg swelling and PND.  Gastrointestinal: Positive for nausea and vomiting. Negative for abdominal pain, blood in stool, constipation, diarrhea and melena.  Neurological: Positive for dizziness.  All other systems reviewed and are negative.    Past Medical History:  Diagnosis Date  . Arthritis    "knees" (09/13/2015)  . Asthma   . Cellulitis   . Chronic diastolic CHF (congestive heart failure) ([place])    a. 09/2015 Echo: EF 55-60%, mild LVH, gr2DD, no rwma, mild MR, mildly dil LA.   Marland Kitchen GERD (gastroesophageal reflux disease)   . High cholesterol   . Hypertension   . Midsternal chest pain    a. 09/2015 MV: no ischemia/infarct, EF 61%; b. 09/2015 Low prob V:Q scan.  . Morbid obesity ([place])   . Obesity   . Pneumonia ~ 2000   "mild case"  . RBBB (right bundle branch block) approx 2010   Dr Marisue Humble did ECG and told her  . Type II diabetes mellitus ([place])     Past Surgical History:  Procedure Laterality Date  . BUNIONECTOMY Left   . COLONOSCOPY WITH PROPOFOL N/A 09/23/2016   Procedure: COLONOSCOPY WITH PROPOFOL;  Surgeon: Garlan Fair, MD;  Location: [facility];  Service: Endoscopy;  Laterality: N/A;  . MULTIPLE TOOTH EXTRACTIONS  ~ 1975  .  TENDON RELEASE Right    Leg  . TUBAL LIGATION  ~ 1983    Social History: Patient lives alone now.  Previously took care of her daughter who is paraplegic.  Patient walks with a cane. Never smoker, no alcohol.    Allergies  Allergen Reactions  . Bee Venom Anaphylaxis  . Iodine Shortness Of Breath and Rash  . Shellfish Allergy Anaphylaxis  . Aspirin Hives  . Latex Hives  . Metformin And Related Other (See Comments)    Patient reports flu-like symptoms and fatigue  . Morphine And Related Other (See Comments)    Childhood allergy (62 years old) , pt got sicker , temp went up, dr gave her the wrong  medication  . Nsaids Hives  . Ciprofloxacin Palpitations    Joint pain    Family history: family history includes Asthma in her paternal grandmother; Cancer in her maternal grandfather; Diabetes in her father, maternal aunt, maternal grandfather, maternal grandmother, mother, paternal grandfather, and paternal grandmother; Heart attack (age of onset: 19) in her father; Heart failure in her paternal grandfather; Hypertension in her father, maternal grandfather, maternal grandmother, mother, paternal grandfather, and paternal grandmother.  Prior to Admission medications   Medication Sig Start Date End Date Taking? Authorizing Provider  glimepiride (AMARYL) 2 MG tablet Take 2 mg by mouth daily with breakfast.  01/05/18  Yes [provider]  JANUVIA 100 MG tablet Take 100 mg by mouth daily. 07/02/16  Yes [provider]  lisinopril (PRINIVIL,ZESTRIL) 10 MG tablet Take 1 tablet (10 mg total) by mouth daily. 07/08/16  Yes Croitoru, Mihai, MD  metoprolol succinate (TOPROL-XL) 50 MG 24 hr tablet Take 50 mg by mouth daily. Take with or immediately following a meal.   Yes [provider]  pravastatin (PRAVACHOL) 20 MG tablet Take 20 mg by mouth daily.   Yes [provider]  triamterene-hydrochlorothiazide (MAXZIDE) 75-50 MG per tablet Take 1 tablet by mouth daily.   Yes [provider]  acetaminophen (TYLENOL) 500 MG tablet Take 1,000 mg by mouth every 6 (six) hours as needed for mild pain or headache.    [provider]  albuterol (PROVENTIL HFA;VENTOLIN HFA) 108 (90 BASE) MCG/ACT inhaler Inhale 2 puffs into the lungs 2 (two) times daily as needed for wheezing or shortness of breath.     [provider]  albuterol (PROVENTIL) (2.5 MG/3ML) 0.083% nebulizer solution Take 2.5 mg by nebulization every 6 (six) hours as needed for wheezing or shortness of breath.    [provider]  famotidine (PEPCID) 10 MG tablet Take 10 mg by mouth daily as  needed for heartburn.     [provider]  mometasone (NASONEX) 50 MCG/ACT nasal spray Place 2 sprays into the nose daily.     [provider]       Physical Exam: BP (!) 153/65   Pulse 71   Temp 98.3 F (36.8 C) (Oral)   Resp 17   SpO2 94%  General appearance: Well-developed, adult female, alert and in moderate distress from anxiety.   Eyes: Anicteric, conjunctiva pink, lids and lashes normal.     ENT: No nasal deformity, discharge, or epistaxis.  OP moist without lesions.   Skin: Warm and dry.  Chin hirsutism. Cardiac: RRR, nl S1-S2, no murmurs appreciated.  Capillary refill is brisk.  JVP not visible due to habitus.  Brawny chronic left LE edema, no pitting or edema on right.  Radial pulses 2+ and symmetric.   Respiratory: Normal respiratory  rate and rhythm.  CTAB without rales or wheezes. GI: Abdomen soft without rigidity.  No TTP. No ascites, distension.   MSK: No deformities or effusions.   Pain not reproduced with palpation of precordium.  No pain with arm movement. Neuro: Sensorium intact and responding to questions, attention normal.  Speech is fluent.  Moves all extremities equally and with normal coordination.    Psych: Behavior appropriate.  Affect tearful.  No evidence of aural or visual hallucinations or delusions.       Labs on Admission:  The metabolic panel shows normal electrolytes and renal function. The complete blood count shows normal hemoglobin and platelets. The initial troponin is negative.  Radiological Exams on Admission: Personally reviewed personally reviewed and shows no focal airspace disease, pneumothorax, or edema: Dg Chest 2 View  Result Date: 02/19/2018 CLINICAL DATA:  Patient here from home with complaints of sudden onset of chest pain, cough, and vomiting that started about an hour ago. Pt reported that she has been feeling "bad" all week. Symptoms started while out running errands today. Medical hx of CHF, diabetes, and HTN.  EXAM: CHEST - 2 VIEW COMPARISON:  07/31/2016 FINDINGS: Cardiac silhouette is borderline enlarged. No mediastinal or hilar masses. No evidence of adenopathy. Clear lungs.  No pleural effusion or pneumothorax. Skeletal structures are intact. IMPRESSION: No active cardiopulmonary disease. Electronically Signed   By: Lajean Manes M.D.   On: 02/19/2018 15:55    EKG: Independently reviewed.  Shows rate 87, old right bundle branch block, anterior T wave inversions, similar to previous.    Assessment/Plan  1. Chest pain: This is new.  The patient has HEART score of 5. Angina is somewhat worrisome (substernal, dull, severe, with nausea) in this obese, diabetic with poorly controlled HTN and family Hx.  Will also rule out pancreatitis (given vomtiing) and PE (given acuity, cough, and chronic asymmetric leg edema).   Other potential causes of chest pain (dissection, pneumonia/effusion, pericarditis) are doubted.  We have been asked to admit the patient for observation and etiology consultation with Cardiology tomorrow.  -Serial troponins are ordered -Check dimer, lipase -Telemetry -Consult to cardiology, appreciate recommendations -Check lipids and A1c   2. Hypertension:  BP elevated -Continue lisinopril, HCTZ, triamterene -Hold metoprolol for stress -If SBP >180, will start nitro  3. Chronic diastolic CHF:  No change in orthopnea, and no edema on CXR, but reports worsening exertional dyspnea, uncontrolled HTN. -Check BNP -Control BP  4. Diabetes:  Well controlled on current oral agents. -Check A1c -Recently stopped Actos, I agree -Hold glimepiride -Continue gliptin -SSI with meals -Continue pravastatin  5. Other medications:  -Continue Pepcid, FLonase, albuterol PRN      DVT prophylaxis: Lovenox Diet: NPO after 4am for anticipated stress testing Code Status: FULL  Family Communication: None presnt  Disposition Plan: Anticipate overnight observation for arrhythmia on  telemetry, serial troponins and subsequent risk stratification by Cardiology.  If testing negative, home after. Consults called: None, will need call to Cardiology in AM Admission status: Telemetry   Medical decision making: Patient seen at 6:17 PM on 02/19/2018.  The patient was discussed with Britni Henderly, PA_C. What exists of the patient's chart was reviewed in depth.  Clinical condition: stable.      Edwin Dada [facility] Pager [phone]

## 2018-02-19 NOTE — Progress Notes (Signed)
Attempted to call ED nurse [phone]) for report twice but no answer. Will wait for call back

## 2018-02-19 NOTE — ED Notes (Signed)
NT Lacinda Curvin H obtained vitals at request of EDP [place]

## 2018-02-19 NOTE — ED Triage Notes (Signed)
Patient here from home with complaints of sudden onset of chest pain, cough, and vomiting that started 19min ago. Reports that she has been feeling "bad" all week. Symptoms started while out running errands.

## 2018-02-19 NOTE — ED Notes (Signed)
Transport has been contacted and will transport patient as soon as possible.

## 2018-02-19 NOTE — ED Provider Notes (Signed)
[facility] DEPT Provider Note   CSN: [id number] Arrival date & time: 02/19/18  1449   History   Chief Complaint Chief Complaint  Patient presents with  . Chest Pain  . Cough  . Emesis    HPI Amanda Butler is a 62 y.o. female with past medical history significant for CHF, hypertension, diabetes, hypercholesterolemia, morbid obesity who presents for evaluation of chest pain.  Patient states that she was walking from [employer] out to her car when she experienced sudden onset substernal chest pain which radiated to her left arm.  Pain did not radiate to her back. Rated her pain a 10/10.  States that she became lightheaded and dizzy and diaphoretic.  Patient states that she felt nauseous and started coughing.  Patient states that the pain was so severe that she has had to drive herself to the emergency room. Patient states that her pain has mostly resolved.  She states she did see cardiology on Monday for her CHF (Dr. Sallyanne Kuster).  Denies ever having a stress test or cardiac cath. Patient states she has had generalized dyspnea with exertion over the "last few months."   History obtained from patient.  No interpreter was used.  HPI  Past Medical History:  Diagnosis Date  . Arthritis    "knees" (09/13/2015)  . Asthma   . Cellulitis   . Chronic diastolic CHF (congestive heart failure) ([place])    a. 09/2015 Echo: EF 55-60%, mild LVH, gr2DD, no rwma, mild MR, mildly dil LA.   Marland Kitchen GERD (gastroesophageal reflux disease)   . High cholesterol   . Hypertension   . Midsternal chest pain    a. 09/2015 MV: no ischemia/infarct, EF 61%; b. 09/2015 Low prob V:Q scan.  . Morbid obesity ([place])   . Obesity   . Pneumonia ~ 2000   "mild case"  . RBBB (right bundle branch block) approx 2010   Dr Marisue Humble did ECG and told her  . Type II diabetes mellitus [facility])     Patient Active Problem List   Diagnosis Date Noted  . Cellulitis 10/02/2017  . Chronic right-sided low back pain  with right-sided sciatica 06/22/2017  . Closed nondisplaced fracture of greater tuberosity of left humerus 04/23/2017  . Hypertension   . Type II diabetes mellitus ([place])   . High cholesterol   . Morbid obesity ([place])   . Chronic diastolic CHF (congestive heart failure) ([place])   . Midsternal chest pain   . Pain in the chest   . Essential hypertension   . Morbid obesity due to excess calories ([place])   . Chest pain 09/13/2015  . Bilateral lower extremity edema 09/13/2015  . Diabetes mellitus type 2, diet-controlled ([place]) 09/13/2015  . Obesity 09/13/2015  . Total bilirubin, elevated 09/13/2015  . HTN (hypertension) 09/13/2015  . Stasis dermatitis of both legs 09/13/2015  . Left leg cellulitis 09/13/2015  . RBBB 09/13/2015  . Asthma 09/13/2015  . GERD (gastroesophageal reflux disease) 09/13/2015  . 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    Past Surgical History:  Procedure Laterality Date  . BUNIONECTOMY Left   . COLONOSCOPY WITH PROPOFOL N/A 09/23/2016   Procedure: COLONOSCOPY WITH PROPOFOL;  Surgeon: Garlan Fair, MD;  Location: [facility] ENDOSCOPY;  Service: Endoscopy;  Laterality: N/A;  . MULTIPLE TOOTH EXTRACTIONS  ~ 1975  . TENDON RELEASE Right    Leg  . TUBAL LIGATION  ~ 1983     OB History   None      Home  Medications    Prior to Admission medications   Medication Sig Start Date End Date Taking? Authorizing Provider  glimepiride (AMARYL) 2 MG tablet Take 2 mg by mouth daily with breakfast.  01/05/18  Yes [provider]  JANUVIA 100 MG tablet Take 100 mg by mouth daily. 07/02/16  Yes [provider]  lisinopril (PRINIVIL,ZESTRIL) 10 MG tablet Take 1 tablet (10 mg total) by mouth daily. 07/08/16  Yes Croitoru, Mihai, MD  metoprolol succinate (TOPROL-XL) 50 MG 24 hr tablet Take 50 mg by mouth daily. Take with or immediately following a meal.   Yes [provider]  pravastatin (PRAVACHOL) 20 MG tablet Take 20 mg by mouth daily.   Yes [provider]  triamterene-hydrochlorothiazide (MAXZIDE) 75-50 MG per tablet Take 1 tablet by mouth daily.   Yes [provider]  acetaminophen (TYLENOL) 500 MG tablet Take 1,000 mg by mouth every 6 (six) hours as needed for mild pain or headache.    [provider]  albuterol (PROVENTIL HFA;VENTOLIN HFA) 108 (90 BASE) MCG/ACT inhaler Inhale 2 puffs into the lungs 2 (two) times daily as needed for wheezing or shortness of breath.     [provider]  albuterol (PROVENTIL) (2.5 MG/3ML) 0.083% nebulizer solution Take 2.5 mg by nebulization every 6 (six) hours as needed for wheezing or shortness of breath.    [provider]  cephALEXin (KEFLEX) 500 MG capsule Take 1 capsule (500 mg total) by mouth 2 (two) times daily. Patient not taking: Reported on 02/19/2018 11/09/17   Molpus, John, MD  famotidine (PEPCID) 10 MG tablet Take 10 mg by mouth daily as needed for heartburn.     [provider]  hydrocortisone 1 % ointment Apply topically 2 (two) times daily. Patient not taking: Reported on 02/19/2018 10/04/17   Roxan Hockey, MD  mometasone (NASONEX) 50 MCG/ACT nasal spray Place 2 sprays into the nose daily.     [provider]    Family History Family History  Problem Relation Age of Onset  . Diabetes Mother   . Hypertension Mother   . Diabetes Father   . Hypertension Father   . Heart attack Father 16  . Diabetes Maternal Aunt   . Diabetes Maternal Grandmother   . Hypertension Maternal Grandmother   . Cancer Maternal Grandfather   . Diabetes Maternal Grandfather   . Hypertension Maternal Grandfather   . Asthma Paternal Grandmother   . Diabetes Paternal Grandmother   . Hypertension Paternal Grandmother   . Diabetes Paternal Grandfather   . Hypertension Paternal Grandfather   . Heart failure Paternal Grandfather     Social History Social History   Tobacco Use  . Smoking status: Never Smoker  . Smokeless tobacco: Never Used  Substance Use  Topics  . Alcohol use: No  . Drug use: No     Allergies   Bee venom; Iodine; Shellfish allergy; Aspirin; Latex; Metformin and related; Morphine and related; Nsaids; and Ciprofloxacin   Review of Systems Review of Systems  Constitutional: Negative.   Respiratory: Positive for cough and shortness of breath. Negative for choking, chest tightness, wheezing and stridor.   Cardiovascular: Positive for chest pain. Negative for leg swelling.  Gastrointestinal: Positive for nausea and vomiting. Negative for abdominal distention, abdominal pain, anal bleeding, blood in stool, constipation, diarrhea and rectal pain.  Genitourinary: Negative.   Musculoskeletal: Negative.   Skin: Positive for wound.  Neurological: Positive for dizziness and light-headedness. Negative for tremors, facial asymmetry, weakness, numbness and headaches.  All other systems reviewed and are negative.    Physical Exam Updated Vital Signs BP (!) 146/53 (BP Location: Left Arm)   Pulse 68   Temp 98.3 F (36.8 C) (Oral)   Resp 17   SpO2 97%   Physical Exam  Constitutional: She appears well-developed and well-nourished.  Non-toxic appearance. She does not appear ill. No distress.  HENT:  Head: Atraumatic.  Eyes: Pupils are equal, round, and reactive to light.  No horizontal, vertical or rotational nystagmus   Neck: Normal range of motion.  Cardiovascular: Normal rate, intact distal pulses and normal pulses. Exam reveals no gallop, no distant heart sounds and no friction rub.  Pulmonary/Chest: Effort normal. No accessory muscle usage or stridor. No tachypnea. No respiratory distress. She has no decreased breath sounds. She has no wheezes. She has no rhonchi. She has no rales.  Abdominal: Soft. Bowel sounds are normal. She exhibits no distension and no mass. There is no tenderness. There is no rebound and no guarding.  Musculoskeletal: Normal range of motion.       Right lower leg: She exhibits no tenderness and no  edema.       Left lower leg: She exhibits no tenderness and no edema.  Neurological: She is alert.  Mental Status:  Alert, oriented, thought content appropriate. Speech fluent without evidence of aphasia. Able to follow 2 step commands without difficulty.  Cranial Nerves:  II:  Peripheral visual fields grossly normal, pupils equal, round, reactive to light III,IV, VI: ptosis not present, extra-ocular motions intact bilaterally  V,VII: smile symmetric, facial light touch sensation equal VIII: hearing grossly normal bilaterally  IX,X: midline uvula rise  XI: bilateral shoulder shrug equal and strong XII: midline tongue extension  Motor:  5/5 in upper and lower extremities bilaterally including strong and equal grip strength and dorsiflexion/plantar flexion Sensory: Pinprick and light touch normal in all extremities.  Deep Tendon Reflexes: 2+ and symmetric  Cerebellar: normal finger-to-nose with bilateral upper extremities Gait: normal gait and balance CV: distal pulses palpable throughout    Skin: Skin is warm and dry.  Erythema and hyperkeratosis to left lower extremity. Currently treated by outpatient wound care. No discharge or warmth to area.  Psychiatric: She has a normal mood and affect.  Nursing note and vitals reviewed.   ED Treatments / Results  Labs (all labs ordered are listed, but only abnormal results are displayed) Labs Reviewed  BASIC METABOLIC PANEL - Abnormal; Notable for the following components:      Result Value   Glucose, Bld 200 (*)    Creatinine, Ser 1.01 (*)    GFR calc non Af Amer 58 (*)    All other components within normal limits  CBC - Abnormal; Notable for the following components:   WBC 10.7 (*)    Hemoglobin 15.2 (*)    All other components within normal limits  I-STAT TROPONIN, ED    EKG EKG Interpretation  Date/Time:  Friday February 19 2018 14:56:06 EST Ventricular Rate:  87 PR Interval:    QRS Duration: 127 QT Interval:  402 QTC  Calculation: 484 R Axis:   47 Text Interpretation:  Sinus arrhythmia Right bundle branch block Baseline wander in lead(s) V4 V5 V6 No significant change since last tracing Confirmed by Orlie Dakin [phone]) on 02/19/2018 2:59:33 PM Also confirmed by Orlie Dakin [phone]), editor Philomena Doheny [phone])  on 02/19/2018 3:49:10 PM   Radiology Dg Chest 2 View  Result Date: 02/19/2018 CLINICAL DATA:  Patient here from  home with complaints of sudden onset of chest pain, cough, and vomiting that started about an hour ago. Pt reported that she has been feeling "bad" all week. Symptoms started while out running errands today. Medical hx of CHF, diabetes, and HTN. EXAM: CHEST - 2 VIEW COMPARISON:  07/31/2016 FINDINGS: Cardiac silhouette is borderline enlarged. No mediastinal or hilar masses. No evidence of adenopathy. Clear lungs.  No pleural effusion or pneumothorax. Skeletal structures are intact. IMPRESSION: No active cardiopulmonary disease. Electronically Signed   By: Lajean Manes M.D.   On: 02/19/2018 15:55    Procedures Procedures (including critical care time)  Medications Ordered in ED Medications - No data to display   Initial Impression / Assessment and Plan / ED Course  I have reviewed the triage vital signs and the nursing notes.  Pertinent labs & imaging results that were available during my care of the patient were reviewed by me and considered in my medical decision making (see chart for details).  62 year old female who appears otherwise well presents for evaluation of sudden onset chest pain. Afebrile, nonseptic,nonill appearing. Pain is substernal and radiated to her left arm.  Rated her pain a 10/10. Patient became diaphoretic, nauseous, one episode of emesis and dizzy.  Pain now resolved on initial evaluation. Has not taken anything for pain.   On reevlaution patient without pain. Troponin 0.00, CBC WBC at 55.9, Metabolic panel with elevated glucose at 200, EKG with right bundle  branch block, consistent with previous EKG. Heart score 4, no tachycardia, non hemoptysis, no recent surgery, hormone use, no leg swelling. Low suspicion for PE. Concern for cardiac etiology of Chest Pain given hx and physical exam. Will consult with Hospitalist for admission for chest pain r/o.  Consulted with Dr. Loleta Books with [facility] who agrees for admission.  Patient has been seen and evaluated by my attending Dr. Ronnald Nian who agrees with the above treatment, plan and disposition of patient.    Final Clinical Impressions(s) / ED Diagnoses   Final diagnoses:  None    ED Discharge Orders    None       Ranger Petrich A, PA-C 02/19/18 1759    Lennice Sites, DO 02/20/18 0134

## 2018-02-19 NOTE — ED Provider Notes (Signed)
Medical screening examination/treatment/procedure(s) were conducted as a shared visit with non-physician practitioner(s) and myself.  I personally evaluated the patient during the encounter. Briefly, the patient is a 62 y.o. female with history of diabetes, cholesterol, hypertension who presents with chest pain.  EKG shows sinus arrhythmia.  No ischemic changes.  Patient with cough but no sputum production.  No fever.  Patient with sudden chest pain, nausea, diaphoresis.  Had unremarkable echocardiogram 2 years ago.  Patient with overall unremarkable exam.  Clear breath sounds.  No signs of volume overload.  Chest x-ray showed no signs of pneumonia, pneumothorax, pleural effusion.  No significant electrolyte abnormality, kidney injury.  Initially within normal limits Wells criteria 0 no concern for PE.  Trop within normal limits.  However given cardiac risk factors will admit for further cardiac workup.   EKG Interpretation  Date/Time:  Friday February 19 2018 14:56:06 EST Ventricular Rate:  87 PR Interval:    QRS Duration: 127 QT Interval:  402 QTC Calculation: 484 R Axis:   47 Text Interpretation:  Sinus arrhythmia Right bundle branch block Baseline wander in lead(s) V4 V5 V6 No significant change since last tracing Confirmed by Orlie Dakin [phone]) on 02/19/2018 2:59:33 PM Also confirmed by Orlie Dakin [phone]), editor Philomena Doheny [phone])  on 02/19/2018 3:49:10 PM          Lennice Sites, DO 02/19/18 1755

## 2018-02-19 NOTE — ED Notes (Signed)
ED TO INPATIENT HANDOFF REPORT  Name/Age/Gender Amanda Butler 62 y.o. female  Code Status Code Status History    Date Active Date Inactive Code Status Order ID Comments User Context   10/02/2017 0824 10/04/2017 1727 Full Code [id number]  Radene Gunning, NP ED   09/13/2015 1418 09/16/2015 1931 Full Code [id number]  Samella Parr, NP Inpatient      Home/SNF/Other Home  Chief Complaint Chest Pain;Cough;Sneezing  Level of Care/Admitting Diagnosis ED Disposition    ED Disposition Condition [facility] Area: [place] [[id number]]  Level of Care: Telemetry [5]  Admit to tele based on following criteria: Monitor for Ischemic changes  Diagnosis: Chest pain [664403]  Admitting Physician: Edwin Dada [[id number]]  Attending Physician: Edwin Dada [[id number]]  PT Class (Do Not Modify): Observation [104]  PT Acc Code (Do Not Modify): Observation [10022]       Medical History Past Medical History:  Diagnosis Date  . Arthritis    "knees" (09/13/2015)  . Asthma   . Cellulitis   . Chronic diastolic CHF (congestive heart failure) ([place])    a. 09/2015 Echo: EF 55-60%, mild LVH, gr2DD, no rwma, mild MR, mildly dil LA.   Marland Kitchen GERD (gastroesophageal reflux disease)   . High cholesterol   . Hypertension   . Midsternal chest pain    a. 09/2015 MV: no ischemia/infarct, EF 61%; b. 09/2015 Low prob V:Q scan.  . Morbid obesity ([place])   . Obesity   . Pneumonia ~ 2000   "mild case"  . RBBB (right bundle branch block) approx 2010   Dr Marisue Humble did ECG and told her  . Type II diabetes mellitus ([facility])     Allergies Allergies  Allergen Reactions  . Bee Venom Anaphylaxis  . Iodine Shortness Of Breath and Rash  . Shellfish Allergy Anaphylaxis  . Aspirin Hives  . Latex Hives  . Metformin And Related Other (See Comments)    Patient reports flu-like symptoms and fatigue  . Morphine And Related Other (See Comments)    Childhood allergy (62 years old) ,  pt got sicker , temp went up, dr gave her the wrong medication  . Nsaids Hives  . Ciprofloxacin Palpitations    Joint pain    IV Location/Drains/Wounds Patient Lines/Drains/Airways Status   Active Line/Drains/Airways    Name:   Placement date:   Placement time:   Site:   Days:   Peripheral IV 02/19/18 Right Hand   02/19/18    1604    Hand   less than 1          Labs/Imaging Results for orders placed or performed during the hospital encounter of 02/19/18 (from the past 48 hour(s))  I-stat troponin, ED     Status: None   Collection Time: 02/19/18  4:10 PM  Result Value Ref Range   Troponin i, poc 0.00 0.00 - 0.08 ng/mL   Comment 3            Comment: Due to the release kinetics of cTnI, a negative result within the first hours of the onset of symptoms does not rule out myocardial infarction with certainty. If myocardial infarction is still suspected, repeat the test at appropriate intervals.   Basic metabolic panel     Status: Abnormal   Collection Time: 02/19/18  4:15 PM  Result Value Ref Range   Sodium 139 135 - 145 mmol/L   Potassium 3.7 3.5 - 5.1 mmol/L   Chloride  101 98 - 111 mmol/L   CO2 26 22 - 32 mmol/L   Glucose, Bld 200 (H) 70 - 99 mg/dL   BUN 17 8 - 23 mg/dL   Creatinine, Ser 1.01 (H) 0.44 - 1.00 mg/dL   Calcium 9.3 8.9 - 10.3 mg/dL   GFR calc non Af Amer 58 (L) >60 mL/min   GFR calc Af Amer >60 >60 mL/min    Comment: (NOTE) The eGFR has been calculated using the CKD EPI equation. This calculation has not been validated in all clinical situations. eGFR's persistently <60 mL/min signify possible Chronic Kidney Disease.    Anion gap 12 5 - 15    Comment: Performed at [facility], [place] [address] [place], [place] [address]  CBC     Status: Abnormal   Collection Time: 02/19/18  4:15 PM  Result Value Ref Range   WBC 10.7 (H) 4.0 - 10.5 K/uL   RBC 5.09 3.87 - 5.11 MIL/uL   Hemoglobin 15.2 (H) 12.0 - 15.0 g/dL   HCT 46.0 36.0 - 46.0 %    MCV 90.4 80.0 - 100.0 fL   MCH 29.9 26.0 - 34.0 pg   MCHC 33.0 30.0 - 36.0 g/dL   RDW 13.2 11.5 - 15.5 %   Platelets 229 150 - 400 K/uL   nRBC 0.0 0.0 - 0.2 %    Comment: Performed at [facility], [place] [address] [place],  [address]   Dg Chest 2 View  Result Date: 02/19/2018 CLINICAL DATA:  Patient here from home with complaints of sudden onset of chest pain, cough, and vomiting that started about an hour ago. Pt reported that she has been feeling "bad" all week. Symptoms started while out running errands today. Medical hx of CHF, diabetes, and HTN. EXAM: CHEST - 2 VIEW COMPARISON:  07/31/2016 FINDINGS: Cardiac silhouette is borderline enlarged. No mediastinal or hilar masses. No evidence of adenopathy. Clear lungs.  No pleural effusion or pneumothorax. Skeletal structures are intact. IMPRESSION: No active cardiopulmonary disease. Electronically Signed   By: Lajean Manes M.D.   On: 02/19/2018 15:55    Pending Labs Unresulted Labs (From admission, onward)   None      Vitals/Pain Today's Vitals   02/19/18 1501 02/19/18 1722 02/19/18 1730  BP:  (!) 146/53 (!) 153/65  Pulse:  68 71  Resp:  17   Temp:  98.3 F (36.8 C)   TempSrc:  Oral   SpO2:  97% 94%  PainSc: 7       Isolation Precautions No active isolations  Medications Medications - No data to display  Mobility walks with person assist

## 2018-02-20 ENCOUNTER — Observation Stay (HOSPITAL_BASED_OUTPATIENT_CLINIC_OR_DEPARTMENT_OTHER): Payer: 59

## 2018-02-20 ENCOUNTER — Ambulatory Visit (HOSPITAL_BASED_OUTPATIENT_CLINIC_OR_DEPARTMENT_OTHER)
Admit: 2018-02-20 | Discharge: 2018-02-20 | Disposition: A | Discharge status: Home / Self Care | Payer: 59 | Attending: Family Medicine | Admitting: Family Medicine

## 2018-02-20 DIAGNOSIS — I5032 Chronic diastolic (congestive) heart failure: Secondary | ICD-10-CM | POA: Diagnosis not present

## 2018-02-20 DIAGNOSIS — R079 Chest pain, unspecified: Secondary | ICD-10-CM | POA: Diagnosis not present

## 2018-02-20 DIAGNOSIS — M7989 Other specified soft tissue disorders: Secondary | ICD-10-CM | POA: Diagnosis not present

## 2018-02-20 DIAGNOSIS — R52 Pain, unspecified: Secondary | ICD-10-CM

## 2018-02-20 DIAGNOSIS — J45909 Unspecified asthma, uncomplicated: Secondary | ICD-10-CM | POA: Diagnosis not present

## 2018-02-20 DIAGNOSIS — I200 Unstable angina: Secondary | ICD-10-CM | POA: Diagnosis not present

## 2018-02-20 DIAGNOSIS — L8990 Pressure ulcer of unspecified site, unspecified stage: Secondary | ICD-10-CM

## 2018-02-20 DIAGNOSIS — L89302 Pressure ulcer of unspecified buttock, stage 2: Secondary | ICD-10-CM

## 2018-02-20 LAB — HIV ANTIBODY (ROUTINE TESTING W REFLEX): HIV Screen 4th Generation wRfx: NONREACTIVE

## 2018-02-20 LAB — HEMOGLOBIN A1C
Hgb A1c MFr Bld: 7.8 % — ABNORMAL HIGH (ref 4.8–5.6)
Mean Plasma Glucose: 177.16 mg/dL

## 2018-02-20 LAB — LIPID PANEL
Cholesterol: 158 mg/dL (ref 0–200)
HDL: 38 mg/dL — ABNORMAL LOW (ref >40)
LDL Cholesterol: 90 mg/dL (ref 0–99)
Total CHOL/HDL Ratio: 4.2 RATIO
Triglycerides: 152 mg/dL — ABNORMAL HIGH (ref <150)
VLDL: 30 mg/dL (ref 0–40)

## 2018-02-20 LAB — GLUCOSE, CAPILLARY
Glucose-Capillary: 193 mg/dL — ABNORMAL HIGH (ref 70–99)
Glucose-Capillary: 176 mg/dL — ABNORMAL HIGH (ref 70–99)
Glucose-Capillary: 176 mg/dL — ABNORMAL HIGH (ref 70–99)
Glucose-Capillary: 201 mg/dL — ABNORMAL HIGH (ref 70–99)

## 2018-02-20 LAB — TROPONIN I: Troponin I: <0.03 ng/mL (ref <0.03)

## 2018-02-20 MED ORDER — REGADENOSON 0.4 MG/5ML IV SOLN
INTRAVENOUS | Status: AC
  Filled 2018-02-20: qty 5

## 2018-02-20 MED ORDER — REGADENOSON 0.4 MG/5ML IV SOLN
0.4000 mg | Freq: Once | INTRAVENOUS | Status: AC
  Administered 2018-02-20: 0.4 mg via INTRAVENOUS

## 2018-02-20 NOTE — Progress Notes (Signed)
  Lexiscan Myoview  Asked by Dr. Loleta Books to perform Nuclear stress test. Primary cardiologist:  Sanda Klein, MD  CEs neg x 3.  Pharmacologic stress portion completed. Images are currently pending. Signed,  Richardson Dopp, PA-C   02/20/2018 10:53 AM

## 2018-02-20 NOTE — Progress Notes (Signed)
*  Preliminary Results* Left lower extremity venous duplex completed. Left lower extremity is negative for deep vein thrombosis. There is no evidence of left Baker's cyst.  02/20/2018 4:29 PM  Maudry Mayhew, MHA, RVT, RDCS, RDMS

## 2018-02-20 NOTE — Progress Notes (Signed)
PROGRESS NOTE    Amanda Butler  URK:[id number] DOB: 04/11/1956 DOA: 02/19/2018 PCP: Gaynelle Arabian, MD      Brief Narrative:  Amanda Butler is a 62 y.o. female with dCHF, HTN, DM and morbid obesity who presents with chest pain. Was walking out of [employer] when she had sudden onset substernal chest pressure, associated with vomiting, pre-syncope and coughing.  She went to her car to rest, and this slowly subsided over about 20-30 minutes as she drove herself to the ER.  In ER, initial ECG and troponin were unremarkable.   Assessment & Plan:  Chest pain: Troponins negative.  Dimer at borderline.  Lipase normal.  First half of stress test today, finish tomorrow due to body habitus.  No further chest pain in the hospital. -Obtain LLE [place] -Will discuss risks and benefits of CTA with patient -Finish stress tomorrow   Hypertension:  BP improvd. -Continue lisinopril, HCTZ, triamterene -Restart metoprolol now that stress portion is over  Chronic diastolic CHF:  BNP normal  Diabetes:  Glucoses normal, A1c 7.8% -Recently stopped Actos, I agree -Hold glimepiride -Continue linagliptin -Continue SSI, statin  Other medications:  -Continue Pepcid, FLonase, albuterol PRN  Pressure injury, stage II, buttocks, POA      MDM and disposition: The below labs and imaging reports were reviewed and summarized above.  Medication management as above.  The patient was admitted with chest pain.  ACS has been ruled out.  Due to habitus, this must be a 2 day study.  To complete the second day tomorrow.         DVT prophylaxis: Lovenox Code Status: FULL Family Communication: None present    Consultants:   Cardiology  Procedures:   Nuclear stress  LE Ultrasound  Antimicrobials:   None    Subjective: Feeling well.  No confusion, no fever.  NO more chest pain.  No dyspnea. No vomiting.  Objective: Vitals:   02/19/18 2133 02/20/18 0455 02/20/18 0820 02/20/18  1435  BP:  (!) 147/69 (!) 141/72 137/62  Pulse:  64 64 61  Resp:  14    Temp:  98.2 F (36.8 C) 97.8 F (36.6 C) 98 F (36.7 C)  TempSrc:  Oral Oral Oral  SpO2: 96% 98% 98% 95%    Intake/Output Summary (Last 24 hours) at 02/20/2018 1515 Last data filed at 02/19/2018 2058 Gross per 24 hour  Intake 240 ml  Output -  Net 240 ml   There were no vitals filed for this visit.  Examination: General appearance: obese adult female, alert and in no acute distress.   HEENT: Anicteric, conjunctiva pink, lids and lashes normal. No nasal deformity, discharge, epistaxis.  Lips moist.   Skin: Warm and dry.  no jaundice.  No suspicious rashes or lesions. Cardiac: RRR, nl S1-S2, no murmurs appreciated.  Capillary refill is brisk.  JVP not visible.  No LE edema ecept brawny change to left leg, chronic.  Radia  pulses 2+ and symmetric. Respiratory: Normal respiratory rate and rhythm.  CTAB without rales or wheezes. Abdomen: Abdomen soft.  no TTP. No ascites, distension, hepatosplenomegaly.   MSK: No deformities or effusions. Neuro: Awake and alert.  EOMI, moves all extremities. Speech fluent.    Psych: Sensorium intact and responding to questions, attention normal. Affect normal.  Judgment and insight appear normal.    Data Reviewed: I have personally reviewed following labs and imaging studies:  CBC: Recent Labs  Lab 02/19/18 1615  WBC 10.7*  HGB 15.2*  HCT  46.0  MCV 90.4  PLT 160   Basic Metabolic Panel: Recent Labs  Lab 02/19/18 1615  NA 139  K 3.7  CL 101  CO2 26  GLUCOSE 200*  BUN 17  CREATININE 1.01*  CALCIUM 9.3   GFR: Estimated Creatinine Clearance: 76 mL/min (A) (by C-G formula based on SCr of 1.01 mg/dL (H)). Liver Function Tests: No results for input(s): AST, ALT, ALKPHOS, BILITOT, PROT, ALBUMIN in the last 168 hours. Recent Labs  Lab 02/19/18 1814  LIPASE 33   No results for input(s): AMMONIA in the last 168 hours. Coagulation Profile: No results for  input(s): INR, PROTIME in the last 168 hours. Cardiac Enzymes: Recent Labs  Lab 02/19/18 1814 02/19/18 2056 02/20/18 0017  TROPONINI <0.03 <0.03 <0.03   BNP (last 3 results) No results for input(s): PROBNP in the last 8760 hours. HbA1C: Recent Labs    02/20/18 0343  HGBA1C 7.8*   CBG: Recent Labs  Lab 02/19/18 2056 02/20/18 0739 02/20/18 1221  GLUCAP 189* 193* 176*   Lipid Profile: Recent Labs    02/20/18 0343  CHOL 158  HDL 38*  LDLCALC 90  TRIG 152*  CHOLHDL 4.2   Thyroid Function Tests: No results for input(s): TSH, T4TOTAL, FREET4, T3FREE, THYROIDAB in the last 72 hours. Anemia Panel: No results for input(s): VITAMINB12, FOLATE, FERRITIN, TIBC, IRON, RETICCTPCT in the last 72 hours. Urine analysis:    Component Value Date/Time   COLORURINE YELLOW 11/08/2017 2328   APPEARANCEUR HAZY (A) 11/08/2017 2328   LABSPEC 1.019 11/08/2017 2328   PHURINE 5.0 11/08/2017 2328   GLUCOSEU NEGATIVE 11/08/2017 2328   HGBUR NEGATIVE 11/08/2017 2328   BILIRUBINUR NEGATIVE 11/08/2017 2328   KETONESUR NEGATIVE 11/08/2017 2328   PROTEINUR NEGATIVE 11/08/2017 2328   NITRITE NEGATIVE 11/08/2017 2328   LEUKOCYTESUR MODERATE (A) 11/08/2017 2328   Sepsis Labs: @LABRCNTIP (procalcitonin:4,lacticacidven:4)  )No results found for this or any previous visit (from the past 240 hour(s)).       Radiology Studies: Dg Chest 2 View  Result Date: 02/19/2018 CLINICAL DATA:  Patient here from home with complaints of sudden onset of chest pain, cough, and vomiting that started about an hour ago. Pt reported that she has been feeling "bad" all week. Symptoms started while out running errands today. Medical hx of CHF, diabetes, and HTN. EXAM: CHEST - 2 VIEW COMPARISON:  07/31/2016 FINDINGS: Cardiac silhouette is borderline enlarged. No mediastinal or hilar masses. No evidence of adenopathy. Clear lungs.  No pleural effusion or pneumothorax. Skeletal structures are intact. IMPRESSION: No  active cardiopulmonary disease. Electronically Signed   By: Lajean Manes M.D.   On: 02/19/2018 15:55        Scheduled Meds: . enoxaparin (LOVENOX) injection  60 mg Subcutaneous Q24H  . fluticasone  1 spray Each Nare Daily  . insulin aspart  0-15 Units Subcutaneous TID WC  . insulin aspart  0-5 Units Subcutaneous QHS  . linagliptin  5 mg Oral Daily  . lisinopril  10 mg Oral Daily  . pravastatin  20 mg Oral Daily  . triamterene-hydrochlorothiazide  1 tablet Oral Daily   Continuous Infusions:   LOS: 0 days    Time spent: 35 minuites    Edwin Dada, MD [facility] 02/20/2018, 3:15 PM     Pager [phone] --- please page though AMION:  [email or web address] Password [email or web address] If 7PM-7AM, please contact night-coverage

## 2018-02-20 NOTE — Plan of Care (Signed)

## 2018-02-21 DIAGNOSIS — J45909 Unspecified asthma, uncomplicated: Secondary | ICD-10-CM | POA: Diagnosis not present

## 2018-02-21 DIAGNOSIS — R079 Chest pain, unspecified: Secondary | ICD-10-CM | POA: Diagnosis not present

## 2018-02-21 DIAGNOSIS — I5032 Chronic diastolic (congestive) heart failure: Secondary | ICD-10-CM | POA: Diagnosis not present

## 2018-02-21 LAB — NM MYOCAR MULTI W/SPECT W/WALL MOTION / EF
Peak HR: 76 {beats}/min
Rest HR: 58 {beats}/min

## 2018-02-21 LAB — GLUCOSE, CAPILLARY
Glucose-Capillary: 194 mg/dL — ABNORMAL HIGH (ref 70–99)
Glucose-Capillary: 184 mg/dL — ABNORMAL HIGH (ref 70–99)

## 2018-02-21 MED ORDER — TECHNETIUM TC 99M TETROFOSMIN IV KIT
30.0000 | PACK | Freq: Once | INTRAVENOUS | Status: AC | PRN
  Administered 2018-02-21: 30 via INTRAVENOUS

## 2018-02-21 MED ORDER — TECHNETIUM TC 99M TETROFOSMIN IV KIT
30.0000 | PACK | Freq: Once | INTRAVENOUS | Status: AC | PRN
  Administered 2018-02-20: 30 via INTRAVENOUS

## 2018-02-21 NOTE — Discharge Summary (Signed)
Physician Discharge Summary  Amanda Butler:[id number] DOB: 03-07-1956 DOA: 02/19/2018  PCP: Amanda Arabian, MD  Admit date: 02/19/2018 Discharge date: 02/21/2018  Admitted From: Home  Disposition:  Home   Recommendations for Outpatient Follow-up:  1. Follow up with PCP in 1 month 2. SGLT-2 inhibitor would be preferable to Actos in diastolic CHF, if not cost-prohibitive   Home Health: None  Equipment/Devices: None  Discharge Condition: Good  CODE STATUS: FULL Diet recommendation: Cardiac, diabetic  Brief/Interim Summary: Amanda Schnoebelen Myersis a 62 y.o.femalewith dCHF, HTN, DM and morbid obesitywho presents with chest pain. Was walking out of [employer] when she had sudden onset substernal chest pressure, associated with vomiting, pre-syncope and coughing. She went to her car to rest, and this slowly subsided over about 20-30 minutes as she drove herself to the ER. In ER, initial ECG and troponin were unremarkable.     PRINCIPAL HOSPITAL DIAGNOSIS: Chest pain, non-cardiac    Discharge Diagnoses:   Chest pain: Serial troponins negative.  D-dimer at age-adjusted cutoff.  LE doppler negative, Wells score 1.5, low risk, doubt PE, discussed CT angiogram with patient, she declined.  Lipase normal.  Stress test low risk.     Hypertension  Chronic diastolic CHF: BNP normal, no evidence of fluid overload on exam or CXR.  Diabetes: Glucoses normal, A1c 7.8%.  See above re: Actos.  Pressure injury, stage II, buttocks, POA              Discharge Instructions  Discharge Instructions    Discharge instructions   Complete by:  As directed    From Dr. Loleta Butler: You were admitted for chest pain and dizziness.   Your EKG and blood work showed that you were not having a heart attack. Your stress test was normal, suggesting that there is no serious build up of fatty plaque in your arteries (of your heart) that is about to cause a heart attack.   I will  forward your discharge summary to Dr. Abelina Butler and Dr. Marisue Butler so they are aware.   Increase activity slowly   Complete by:  As directed      Allergies as of 02/21/2018      Reactions   Bee Venom Anaphylaxis   Iodine Shortness Of Breath, Rash   Shellfish Allergy Anaphylaxis   Aspirin Hives   Latex Hives   Metformin And Related Other (See Comments)   Patient reports flu-like symptoms and fatigue   Morphine And Related Other (See Comments)   Childhood allergy (62 years old) , pt got sicker , temp went up, dr gave her the wrong medication   Nsaids Hives   Ciprofloxacin Palpitations   Joint pain      Medication List    TAKE these medications   acetaminophen 500 MG tablet Commonly known as:  TYLENOL Take 1,000 mg by mouth every 6 (six) hours as needed for mild pain or headache.   albuterol 108 (90 Base) MCG/ACT inhaler Commonly known as:  PROVENTIL HFA;VENTOLIN HFA Inhale 2 puffs into the lungs 2 (two) times daily as needed for wheezing or shortness of breath.   albuterol (2.5 MG/3ML) 0.083% nebulizer solution Commonly known as:  PROVENTIL Take 2.5 mg by nebulization every 6 (six) hours as needed for wheezing or shortness of breath.   famotidine 10 MG tablet Commonly known as:  PEPCID Take 10 mg by mouth daily as needed for heartburn.   glimepiride 2 MG tablet Commonly known as:  AMARYL Take 2 mg by mouth daily  with breakfast.   JANUVIA 100 MG tablet Generic drug:  sitaGLIPtin Take 100 mg by mouth daily.   lisinopril 10 MG tablet Commonly known as:  PRINIVIL,ZESTRIL Take 1 tablet (10 mg total) by mouth daily.   metoprolol succinate 50 MG 24 hr tablet Commonly known as:  TOPROL-XL Take 50 mg by mouth daily. Take with or immediately following a meal.   mometasone 50 MCG/ACT nasal spray Commonly known as:  NASONEX Place 2 sprays into the nose daily.   pravastatin 20 MG tablet Commonly known as:  PRAVACHOL Take 20 mg by mouth daily.    triamterene-hydrochlorothiazide 75-50 MG tablet Commonly known as:  MAXZIDE Take 1 tablet by mouth daily.       Allergies  Allergen Reactions  . Bee Venom Anaphylaxis  . Iodine Shortness Of Breath and Rash  . Shellfish Allergy Anaphylaxis  . Aspirin Hives  . Latex Hives  . Metformin And Related Other (See Comments)    Patient reports flu-like symptoms and fatigue  . Morphine And Related Other (See Comments)    Childhood allergy (62 years old) , pt got sicker , temp went up, dr gave her the wrong medication  . Nsaids Hives  . Ciprofloxacin Palpitations    Joint pain    Consultations:  None   Procedures/Studies: Dg Chest 2 View  Result Date: 02/19/2018 CLINICAL DATA:  Patient here from home with complaints of sudden onset of chest pain, cough, and vomiting that started about an hour ago. Pt reported that she has been feeling "bad" all week. Symptoms started while out running errands today. Medical hx of CHF, diabetes, and HTN. EXAM: CHEST - 2 VIEW COMPARISON:  07/31/2016 FINDINGS: Cardiac silhouette is borderline enlarged. No mediastinal or hilar masses. No evidence of adenopathy. Clear lungs.  No pleural effusion or pneumothorax. Skeletal structures are intact. IMPRESSION: No active cardiopulmonary disease. Electronically Signed   By: Lajean Manes M.D.   On: 02/19/2018 15:55   Dg Shoulder Right  Result Date: 01/24/2018 CLINICAL DATA:  Right arm pain EXAM: RIGHT SHOULDER - 2+ VIEW COMPARISON:  None. FINDINGS: Normal alignment no fracture. AC degenerative change with joint space narrowing and spurring. No focal bony abnormality. IMPRESSION: AC degenerative change.  No acute abnormality. Electronically Signed   By: Franchot Gallo M.D.   On: 01/24/2018 10:40   Nm Myocar Multi W/spect W/wall Motion / Ef  Result Date: 02/21/2018 CLINICAL DATA:  62 year old female with chest pain, negative troponins. EXAM: MYOCARDIAL IMAGING WITH SPECT (REST AND PHARMACOLOGIC-STRESS) GATED LEFT  VENTRICULAR WALL MOTION STUDY LEFT VENTRICULAR EJECTION FRACTION TECHNIQUE: Standard myocardial SPECT imaging was performed after resting intravenous injection of 10 mCi Tc-4m tetrofosmin. Subsequently, intravenous infusion of Lexiscan was performed under the supervision of the Cardiology staff. At peak effect of the drug, 30 mCi Tc-13m tetrofosmin was injected intravenously and standard myocardial SPECT imaging was performed. Quantitative gated imaging was also performed to evaluate left ventricular wall motion, and estimate left ventricular ejection fraction. COMPARISON:  Chest x-ray 02/19/2018 FINDINGS: Perfusion: No decreased activity in the left ventricle on stress only imaging to suggest reversible ischemia or infarction. Small regions of fixed radiotracer paucity in the anterior apical and inferolateral apical walls, likely representing regions of artifact related to breast attenuation artifact and possibly diaphragmatic attenuation artifact. The inferolateral defect may represent a small remote infarct. Wall Motion: Normal left ventricular wall motion. No left ventricular dilation. Left Ventricular Ejection Fraction: 55 % End diastolic volume 740 ml End systolic volume 51 ml IMPRESSION: 1.  No reversible ischemia. Possible small remote infarct/scar in the apical inferolateral wall. 2. Normal left ventricular wall motion. 3. Left ventricular ejection fraction 55% 4. Non invasive risk stratification*: Low *2012 Appropriate Use Criteria for Coronary Revascularization Focused Update: J Am Coll Cardiol. 7425;95(6):387-564. http://content.[email or web address].aspx?articleid=[email or web address] Electronically Signed   By: Jacqulynn Cadet M.D.   On: 02/21/2018 11:02   Vas [place] Lower Extremity Venous (dvt)  Result Date: 02/21/2018  Lower Venous Study Indications: Pain, and Swelling.  Performing Technologist: Maudry Mayhew MHA, RDMS, RVT, RDCS  Examination Guidelines: A complete evaluation includes B-mode imaging,  spectral Doppler, color Doppler, and power Doppler as needed of all accessible portions of each vessel. Bilateral testing is considered an integral part of a complete examination. Limited examinations for reoccurring indications may be performed as noted.  Right Venous Findings: +---+---------------+---------+-----------+----------+-------------------+    CompressibilityPhasicitySpontaneityPropertiesSummary             +---+---------------+---------+-----------+----------+-------------------+ CFV                                             Unable to visualize +---+---------------+---------+-----------+----------+-------------------+  Left Venous Findings: +---------+---------------+---------+-----------+----------+-------------------+          CompressibilityPhasicitySpontaneityPropertiesSummary             +---------+---------------+---------+-----------+----------+-------------------+ CFV      Full           Yes      Yes                                      +---------+---------------+---------+-----------+----------+-------------------+ SFJ                                                   Unable to visualize +---------+---------------+---------+-----------+----------+-------------------+ FV Prox  Full                                                             +---------+---------------+---------+-----------+----------+-------------------+ FV Mid   Full                                                             +---------+---------------+---------+-----------+----------+-------------------+ FV DistalFull                                                             +---------+---------------+---------+-----------+----------+-------------------+ POP      Full           Yes      Yes                                      +---------+---------------+---------+-----------+----------+-------------------+  PTV      Full                                                              +---------+---------------+---------+-----------+----------+-------------------+ PERO     Full                                                             +---------+---------------+---------+-----------+----------+-------------------+    Summary: Left: There is no evidence of deep vein thrombosis in the lower extremity. However, portions of this examination were limited- see technologist comments above. No cystic structure found in the popliteal fossa.  *See table(s) above for measurements and observations. Electronically signed by Monica Martinez MD on 02/21/2018 at 10:06:44 AM.    Final        Subjective: Feeling well.  No further chest pain.  No change in chronic orthopnea, no PND, no leg swelling, no palpitations.  Tele unremarkable.    Discharge Exam: Vitals:   02/21/18 0448 02/21/18 1356  BP: 132/69 (!) 147/71  Pulse: 63 64  Resp: 20   Temp: 98.1 F (36.7 C) 98 F (36.7 C)  SpO2: 97%    Vitals:   02/20/18 1435 02/20/18 2032 02/21/18 0448 02/21/18 1356  BP: 137/62 (!) 103/57 132/69 (!) 147/71  Pulse: 61 60 63 64  Resp:  18 20   Temp: 98 F (36.7 C) (!) 97.4 F (36.3 C) 98.1 F (36.7 C) 98 F (36.7 C)  TempSrc: Oral Oral Oral Oral  SpO2: 95% 97% 97%     General: Pt is alert, awake, not in acute distress Cardiovascular: RRR, nl S1-S2, no murmurs appreciated.   No LE edema.   Respiratory: Normal respiratory rate and rhythm.  CTAB without rales or wheezes. Abdominal: Abdomen soft and non-tender.  No distension or HSM.   Neuro/Psych: Strength symmetric in upper and lower extremities.  Judgment and insight appear normal.   The results of significant diagnostics from this hospitalization (including imaging, microbiology, ancillary and laboratory) are listed below for reference.     Microbiology: No results found for this or any previous visit (from the past 240 hour(s)).   Labs: BNP (last 3 results) Recent Labs    02/19/18 2056  BNP  17.4   Basic Metabolic Panel: Recent Labs  Lab 02/19/18 1615  NA 139  K 3.7  CL 101  CO2 26  GLUCOSE 200*  BUN 17  CREATININE 1.01*  CALCIUM 9.3   Liver Function Tests: No results for input(s): AST, ALT, ALKPHOS, BILITOT, PROT, ALBUMIN in the last 168 hours. Recent Labs  Lab 02/19/18 1814  LIPASE 33   No results for input(s): AMMONIA in the last 168 hours. CBC: Recent Labs  Lab 02/19/18 1615  WBC 10.7*  HGB 15.2*  HCT 46.0  MCV 90.4  PLT 229   Cardiac Enzymes: Recent Labs  Lab 02/19/18 1814 02/19/18 2056 02/20/18 0017  TROPONINI <0.03 <0.03 <0.03   BNP: Invalid input(s): POCBNP CBG: Recent Labs  Lab 02/20/18 1221 02/20/18 1707 02/20/18 2032 02/21/18 0804 02/21/18 1215  GLUCAP 176* 176* 201* 194* 184*   D-Dimer Recent  Labs    02/19/18 1814  DDIMER 0.70*   Hgb A1c Recent Labs    02/20/18 0343  HGBA1C 7.8*   Lipid Profile Recent Labs    02/20/18 0343  CHOL 158  HDL 38*  LDLCALC 90  TRIG 152*  CHOLHDL 4.2   Thyroid function studies No results for input(s): TSH, T4TOTAL, T3FREE, THYROIDAB in the last 72 hours.  Invalid input(s): FREET3 Anemia work up No results for input(s): VITAMINB12, FOLATE, FERRITIN, TIBC, IRON, RETICCTPCT in the last 72 hours. Urinalysis    Component Value Date/Time   COLORURINE YELLOW 11/08/2017 2328   APPEARANCEUR HAZY (A) 11/08/2017 2328   LABSPEC 1.019 11/08/2017 2328   PHURINE 5.0 11/08/2017 2328   GLUCOSEU NEGATIVE 11/08/2017 2328   HGBUR NEGATIVE 11/08/2017 2328   BILIRUBINUR NEGATIVE 11/08/2017 2328   KETONESUR NEGATIVE 11/08/2017 2328   PROTEINUR NEGATIVE 11/08/2017 2328   NITRITE NEGATIVE 11/08/2017 2328   LEUKOCYTESUR MODERATE (A) 11/08/2017 2328   Sepsis Labs Invalid input(s): PROCALCITONIN,  WBC,  LACTICIDVEN Microbiology No results found for this or any previous visit (from the past 240 hour(s)).   Time coordinating discharge: 20 minutes       SIGNED:   Edwin Dada,  MD  [facility] 02/21/2018, 5:15 PM

## 2018-03-16 ENCOUNTER — Other Ambulatory Visit: Payer: Self-pay

## 2018-03-16 ENCOUNTER — Emergency Department (HOSPITAL_COMMUNITY): Payer: 59

## 2018-03-16 ENCOUNTER — Encounter (HOSPITAL_COMMUNITY): Payer: Self-pay | Admitting: Emergency Medicine

## 2018-03-16 ENCOUNTER — Emergency Department (HOSPITAL_COMMUNITY)
Admission: EM | Admit: 2018-03-16 | Discharge: 2018-03-17 | Disposition: A | Discharge status: Home / Self Care | Payer: 59 | Attending: Emergency Medicine | Admitting: Emergency Medicine

## 2018-03-16 DIAGNOSIS — Z79899 Other long term (current) drug therapy: Secondary | ICD-10-CM | POA: Insufficient documentation

## 2018-03-16 DIAGNOSIS — I5032 Chronic diastolic (congestive) heart failure: Secondary | ICD-10-CM | POA: Insufficient documentation

## 2018-03-16 DIAGNOSIS — R42 Dizziness and giddiness: Secondary | ICD-10-CM | POA: Diagnosis not present

## 2018-03-16 DIAGNOSIS — Z91040 Latex allergy status: Secondary | ICD-10-CM | POA: Diagnosis not present

## 2018-03-16 DIAGNOSIS — R197 Diarrhea, unspecified: Secondary | ICD-10-CM | POA: Diagnosis not present

## 2018-03-16 DIAGNOSIS — J45909 Unspecified asthma, uncomplicated: Secondary | ICD-10-CM | POA: Diagnosis not present

## 2018-03-16 DIAGNOSIS — I110 Hypertensive heart disease with heart failure: Secondary | ICD-10-CM | POA: Insufficient documentation

## 2018-03-16 DIAGNOSIS — R112 Nausea with vomiting, unspecified: Secondary | ICD-10-CM | POA: Insufficient documentation

## 2018-03-16 DIAGNOSIS — E119 Type 2 diabetes mellitus without complications: Secondary | ICD-10-CM | POA: Diagnosis not present

## 2018-03-16 DIAGNOSIS — J111 Influenza due to unidentified influenza virus with other respiratory manifestations: Secondary | ICD-10-CM | POA: Diagnosis not present

## 2018-03-16 DIAGNOSIS — R079 Chest pain, unspecified: Secondary | ICD-10-CM | POA: Diagnosis not present

## 2018-03-16 LAB — URINALYSIS, ROUTINE W REFLEX MICROSCOPIC
Bilirubin Urine: NEGATIVE
Glucose, UA: >=500 mg/dL — AB
Hgb urine dipstick: NEGATIVE
Ketones, ur: NEGATIVE mg/dL
Leukocytes, UA: NEGATIVE
Nitrite: NEGATIVE
Protein, ur: NEGATIVE mg/dL
Specific Gravity, Urine: 1.028 (ref 1.005–1.030)
pH: 5 (ref 5.0–8.0)

## 2018-03-16 LAB — BASIC METABOLIC PANEL
Anion gap: 14 (ref 5–15)
BUN: 12 mg/dL (ref 8–23)
CO2: 22 mmol/L (ref 22–32)
Calcium: 8.9 mg/dL (ref 8.9–10.3)
Chloride: 100 mmol/L (ref 98–111)
Creatinine, Ser: 1 mg/dL (ref 0.44–1.00)
GFR calc Af Amer: >60 mL/min (ref >60)
GFR calc non Af Amer: 59 mL/min — ABNORMAL LOW (ref >60)
Glucose, Bld: 392 mg/dL — ABNORMAL HIGH (ref 70–99)
Potassium: 3.9 mmol/L (ref 3.5–5.1)
Sodium: 136 mmol/L (ref 135–145)

## 2018-03-16 LAB — CBC
HCT: 49.2 % — ABNORMAL HIGH (ref 36.0–46.0)
Hemoglobin: 15.4 g/dL — ABNORMAL HIGH (ref 12.0–15.0)
MCH: 27.9 pg (ref 26.0–34.0)
MCHC: 31.3 g/dL (ref 30.0–36.0)
MCV: 89.1 fL (ref 80.0–100.0)
Platelets: 217 10*3/uL (ref 150–400)
RBC: 5.52 MIL/uL — ABNORMAL HIGH (ref 3.87–5.11)
RDW: 13.3 % (ref 11.5–15.5)
WBC: 6.4 10*3/uL (ref 4.0–10.5)
nRBC: 0 % (ref 0.0–0.2)

## 2018-03-16 LAB — I-STAT TROPONIN, ED: Troponin i, poc: 0.01 ng/mL (ref 0.00–0.08)

## 2018-03-16 MED ORDER — SODIUM CHLORIDE 0.9 % IV BOLUS
1000.0000 mL | Freq: Once | INTRAVENOUS | Status: AC
  Administered 2018-03-16: 1000 mL via INTRAVENOUS

## 2018-03-16 MED ORDER — ONDANSETRON HCL 4 MG/2ML IJ SOLN
4.0000 mg | Freq: Once | INTRAMUSCULAR | Status: AC
  Administered 2018-03-16: 4 mg via INTRAVENOUS
  Filled 2018-03-16: qty 2

## 2018-03-16 NOTE — ED Triage Notes (Signed)
Pt reports dizziness, cough, stuffy/runny nose, SOB, nausea, and mild chest discomfort. Hx of CHF.

## 2018-03-17 MED ORDER — ONDANSETRON HCL 4 MG PO TABS: 4.0000 mg | ORAL_TABLET | Freq: Four times a day (QID) | ORAL | 0 refills | Status: DC

## 2018-03-17 NOTE — Discharge Instructions (Signed)
Your glucose was elevated tonight.  Increase fluids.  Prescription for nausea.  Follow-up with your primary care doctor.

## 2018-03-18 LAB — URINE CULTURE: Culture: 40000 — AB

## 2018-03-19 ENCOUNTER — Telehealth: Payer: Self-pay | Admitting: *Deleted

## 2018-03-19 NOTE — Telephone Encounter (Signed)
Post ED Visit - Positive Culture Follow-up  Culture report reviewed by antimicrobial stewardship pharmacist:  []  Elenor Quinones, Pharm.D. []  Heide Guile, Pharm.D., BCPS AQ-ID []  Parks Neptune, Pharm.D., BCPS []  Alycia Rossetti, Pharm.D., BCPS []  [place], [place].D., BCPS, AAHIVP []  Legrand Como, Pharm.D., BCPS, AAHIVP []  Salome Arnt, PharmD, BCPS []  Johnnette Gourd, PharmD, BCPS [x]  Hughes Better, PharmD, BCPS []  Leeroy Cha, PharmD  Positive urine culture Urinalysis negative and no further patient follow-up is required at this time.  Harlon Flor [facility] 03/19/2018, 8:41 AM

## 2018-03-20 NOTE — ED Provider Notes (Signed)
[facility] Provider Note   CSN: [id number] Arrival date & time: 03/16/18  1616     History   Chief Complaint Chief Complaint  Patient presents with  . URI  . Chest Pain  . Dizziness    HPI Amanda Butler is a 62 y.o. female.  Nausea, vomiting, diarrhea, congestion, cough, mild chest pain for 24 hours.  Patient thinks she has picked up a respiratory infection.  No crushing substernal chest pain, dyspnea, diaphoresis.  Severity of symptoms is moderate.  Nothing makes symptoms better or worse.     Past Medical History:  Diagnosis Date  . Arthritis    "knees" (09/13/2015)  . Asthma   . Cellulitis   . Chronic diastolic CHF (congestive heart failure) ([place])    a. 09/2015 Echo: EF 55-60%, mild LVH, gr2DD, no rwma, mild MR, mildly dil LA.   Marland Kitchen GERD (gastroesophageal reflux disease)   . High cholesterol   . Hypertension   . Midsternal chest pain    a. 09/2015 MV: no ischemia/infarct, EF 61%; b. 09/2015 Low prob V:Q scan.  . Morbid obesity ([place])   . Obesity   . Pneumonia ~ 2000   "mild case"  . RBBB (right bundle branch block) approx 2010   Dr Marisue Humble did ECG and told her  . Type II diabetes mellitus [facility])     Patient Active Problem List   Diagnosis Date Noted  . Pressure injury of skin 02/20/2018  . Cellulitis 10/02/2017  . 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  . Closed nondisplaced fracture of greater tuberosity of left humerus 04/23/2017  . Type II diabetes mellitus ([place])   . High cholesterol   . Morbid obesity ([facility])   . Chronic diastolic CHF (congestive heart failure) ([place])   . Midsternal chest pain   . Pain in the chest   . Essential hypertension   . Morbid obesity due to excess calories ([place])   . Chest pain 09/13/2015  . Bilateral lower extremity edema 09/13/2015  . Diabetes mellitus type 2, diet-controlled ([place]) 09/13/2015  . Obesity 09/13/2015  . Total bilirubin, elevated 09/13/2015  .  HTN (hypertension) 09/13/2015  . Stasis dermatitis of both legs 09/13/2015  . Left leg cellulitis 09/13/2015  . RBBB 09/13/2015  . Asthma 09/13/2015  . GERD (gastroesophageal reflux disease) 09/13/2015  . 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    Past Surgical History:  Procedure Laterality Date  . BUNIONECTOMY Left   . COLONOSCOPY WITH PROPOFOL N/A 09/23/2016   Procedure: COLONOSCOPY WITH PROPOFOL;  Surgeon: Garlan Fair, MD;  Location: [facility];  Service: Endoscopy;  Laterality: N/A;  . MULTIPLE TOOTH EXTRACTIONS  ~ 1975  . TENDON RELEASE Right    Leg  . TUBAL LIGATION  ~ 1983     OB History   None      Home Medications    Prior to Admission medications   Medication Sig Start Date End Date Taking? Authorizing Provider  albuterol (PROVENTIL HFA;VENTOLIN HFA) 108 (90 BASE) MCG/ACT inhaler Inhale 2 puffs into the lungs 2 (two) times daily as needed for wheezing or shortness of breath.    Yes [provider]  albuterol (PROVENTIL) (2.5 MG/3ML) 0.083% nebulizer solution Take 2.5 mg by nebulization every 6 (six) hours as needed for wheezing or shortness of breath.   Yes [provider]  famotidine (PEPCID) 10 MG tablet Take 10 mg by mouth daily as needed for heartburn.    Yes [provider]  JANUVIA 100 MG tablet Take 100 mg by mouth daily. 07/02/16  Yes [provider]  lisinopril (PRINIVIL,ZESTRIL) 10 MG tablet Take 1 tablet (10 mg total) by mouth daily. 07/08/16  Yes Croitoru, Mihai, MD  metoprolol succinate (TOPROL-XL) 50 MG 24 hr tablet Take 50 mg by mouth daily. Take with or immediately following a meal.   Yes [provider]  mometasone (NASONEX) 50 MCG/ACT nasal spray Place 2 sprays into the nose daily.    Yes [provider]  pravastatin (PRAVACHOL) 20 MG tablet Take 20 mg by mouth daily.   Yes [provider]  triamterene-hydrochlorothiazide (MAXZIDE) 75-50 MG per tablet Take 1 tablet by mouth daily.   Yes  [provider]  ondansetron (ZOFRAN) 4 MG tablet Take 1 tablet (4 mg total) by mouth every 6 (six) hours. 03/17/18   Nat Christen, MD    Family History Family History  Problem Relation Age of Onset  . Diabetes Mother   . Hypertension Mother   . Diabetes Father   . Hypertension Father   . Heart attack Father 32  . Diabetes Maternal Aunt   . Diabetes Maternal Grandmother   . Hypertension Maternal Grandmother   . Cancer Maternal Grandfather   . Diabetes Maternal Grandfather   . Hypertension Maternal Grandfather   . Asthma Paternal Grandmother   . Diabetes Paternal Grandmother   . Hypertension Paternal Grandmother   . Diabetes Paternal Grandfather   . Hypertension Paternal Grandfather   . Heart failure Paternal Grandfather     Social History Social History   Tobacco Use  . Smoking status: Never Smoker  . Smokeless tobacco: Never Used  Substance Use Topics  . Alcohol use: No  . Drug use: No     Allergies   Bee venom; Iodine; Shellfish allergy; Aspirin; Latex; Metformin and related; Morphine and related; Nsaids; and Ciprofloxacin   Review of Systems Review of Systems  All other systems reviewed and are negative.    Physical Exam Updated Vital Signs BP (!) 159/69   Pulse 73   Temp 98.4 F (36.9 C) (Oral)   Resp 16   SpO2 95%   Physical Exam  Constitutional: She is oriented to person, place, and time.  Pale, slightly dehydrated, no acute distress  HENT:  Head: Normocephalic and atraumatic.  Eyes: Conjunctivae are normal.  Neck: Neck supple.  Cardiovascular: Normal rate and regular rhythm.  Pulmonary/Chest: Effort normal and breath sounds normal.  Abdominal: Soft. Bowel sounds are normal.  Musculoskeletal: Normal range of motion.  Neurological: She is alert and oriented to person, place, and time.  Skin: Skin is warm and dry.  Psychiatric: She has a normal mood and affect. Her behavior is normal.  Nursing note and vitals reviewed.    ED  Treatments / Results  Labs (all labs ordered are listed, but only abnormal results are displayed) Labs Reviewed  URINE CULTURE - Abnormal; Notable for the following components:      Result Value   Culture   (*)    Value: 40,000 COLONIES/mL GROUP B STREP(S.AGALACTIAE)ISOLATED TESTING AGAINST S. AGALACTIAE NOT ROUTINELY PERFORMED DUE TO PREDICTABILITY OF AMP/PEN/VAN SUSCEPTIBILITY. Performed at [facility], [place] [address], [place], [place] [address]    All other components within normal limits  BASIC METABOLIC PANEL - Abnormal; Notable for the following components:   Glucose, Bld 392 (*)    GFR calc non Af Amer 59 (*)    All other components within normal limits  CBC -  Abnormal; Notable for the following components:   RBC 5.52 (*)    Hemoglobin 15.4 (*)    HCT 49.2 (*)    All other components within normal limits  URINALYSIS, ROUTINE W REFLEX MICROSCOPIC - Abnormal; Notable for the following components:   Glucose, UA >=500 (*)    Bacteria, UA RARE (*)    All other components within normal limits  I-STAT TROPONIN, ED    EKG EKG Interpretation  Date/Time:  Tuesday March 16 2018 16:21:33 EST Ventricular Rate:  90 PR Interval:  162 QRS Duration: 120 QT Interval:  376 QTC Calculation: 459 R Axis:   -6 Text Interpretation:  Normal sinus rhythm Right bundle branch block Moderate voltage criteria for LVH, may be normal variant Abnormal ECG Confirmed by Nat Christen [phone]) on 03/16/2018 7:46:37 PM   Radiology No results found.  Procedures Procedures (including critical care time)  Medications Ordered in ED Medications  ondansetron (ZOFRAN) injection 4 mg (4 mg Intravenous Given 03/16/18 1931)  sodium chloride 0.9 % bolus 1,000 mL (0 mLs Intravenous Stopped 03/16/18 2233)  sodium chloride 0.9 % bolus 1,000 mL (0 mLs Intravenous Stopped 03/17/18 0000)     Initial Impression / Assessment and Plan / ED Course  I have reviewed the triage vital signs and the nursing  notes.  Pertinent labs & imaging results that were available during my care of the patient were reviewed by me and considered in my medical decision making (see chart for details).     History and physical most consistent with viral URI dehydration.  Glucose elevated.  EKG and troponin negative for acute findings.  She responded well to IV fluids and IV Zofran.  She was rechecked prior to discharge and felt better and looked better.  Discharge medications Zofran 4 mg. Final Clinical Impressions(s) / ED Diagnoses   Final diagnoses:  Nausea vomiting and diarrhea    ED Discharge Orders         Ordered    ondansetron (ZOFRAN) 4 MG tablet  Every 6 hours     03/17/18 0002           Nat Christen, MD 03/20/18 1259

## 2018-03-29 DIAGNOSIS — R079 Chest pain, unspecified: Secondary | ICD-10-CM | POA: Diagnosis not present

## 2018-03-29 DIAGNOSIS — E1129 Type 2 diabetes mellitus with other diabetic kidney complication: Secondary | ICD-10-CM | POA: Diagnosis not present

## 2018-03-29 DIAGNOSIS — I10 Essential (primary) hypertension: Secondary | ICD-10-CM | POA: Diagnosis not present

## 2018-04-02 ENCOUNTER — Telehealth: Payer: Self-pay

## 2018-04-12 DIAGNOSIS — E1129 Type 2 diabetes mellitus with other diabetic kidney complication: Secondary | ICD-10-CM | POA: Diagnosis not present

## 2018-04-27 ENCOUNTER — Emergency Department (HOSPITAL_COMMUNITY)
Admission: EM | Admit: 2018-04-27 | Discharge: 2018-04-27 | Disposition: A | Discharge status: Home / Self Care | Payer: BLUE CROSS/BLUE SHIELD | Attending: Emergency Medicine | Admitting: Emergency Medicine

## 2018-04-27 ENCOUNTER — Other Ambulatory Visit: Payer: Self-pay

## 2018-04-27 ENCOUNTER — Emergency Department (HOSPITAL_COMMUNITY): Payer: BLUE CROSS/BLUE SHIELD

## 2018-04-27 DIAGNOSIS — R072 Precordial pain: Secondary | ICD-10-CM | POA: Diagnosis not present

## 2018-04-27 DIAGNOSIS — I5032 Chronic diastolic (congestive) heart failure: Secondary | ICD-10-CM | POA: Insufficient documentation

## 2018-04-27 DIAGNOSIS — J45909 Unspecified asthma, uncomplicated: Secondary | ICD-10-CM | POA: Diagnosis not present

## 2018-04-27 DIAGNOSIS — Z91040 Latex allergy status: Secondary | ICD-10-CM | POA: Diagnosis not present

## 2018-04-27 DIAGNOSIS — R531 Weakness: Secondary | ICD-10-CM | POA: Diagnosis not present

## 2018-04-27 DIAGNOSIS — E119 Type 2 diabetes mellitus without complications: Secondary | ICD-10-CM | POA: Insufficient documentation

## 2018-04-27 DIAGNOSIS — R079 Chest pain, unspecified: Secondary | ICD-10-CM | POA: Diagnosis present

## 2018-04-27 DIAGNOSIS — I110 Hypertensive heart disease with heart failure: Secondary | ICD-10-CM | POA: Diagnosis not present

## 2018-04-27 LAB — CBC
HCT: 48.3 % — ABNORMAL HIGH (ref 36.0–46.0)
Hemoglobin: 15.8 g/dL — ABNORMAL HIGH (ref 12.0–15.0)
MCH: 28.6 pg (ref 26.0–34.0)
MCHC: 32.7 g/dL (ref 30.0–36.0)
MCV: 87.5 fL (ref 80.0–100.0)
Platelets: 242 10*3/uL (ref 150–400)
RBC: 5.52 MIL/uL — ABNORMAL HIGH (ref 3.87–5.11)
RDW: 13.1 % (ref 11.5–15.5)
WBC: 10.4 10*3/uL (ref 4.0–10.5)
nRBC: 0 % (ref 0.0–0.2)

## 2018-04-27 LAB — COMPREHENSIVE METABOLIC PANEL
ALT: 20 U/L (ref 0–44)
AST: 20 U/L (ref 15–41)
Albumin: 3.9 g/dL (ref 3.5–5.0)
Alkaline Phosphatase: 85 U/L (ref 38–126)
Anion gap: 15 (ref 5–15)
BUN: 12 mg/dL (ref 8–23)
CO2: 20 mmol/L — ABNORMAL LOW (ref 22–32)
Calcium: 9.6 mg/dL (ref 8.9–10.3)
Chloride: 103 mmol/L (ref 98–111)
Creatinine, Ser: 0.91 mg/dL (ref 0.44–1.00)
GFR calc Af Amer: >60 mL/min (ref >60)
GFR calc non Af Amer: >60 mL/min (ref >60)
Glucose, Bld: 143 mg/dL — ABNORMAL HIGH (ref 70–99)
Potassium: 3.9 mmol/L (ref 3.5–5.1)
Sodium: 138 mmol/L (ref 135–145)
Total Bilirubin: 1.4 mg/dL — ABNORMAL HIGH (ref 0.3–1.2)
Total Protein: 8 g/dL (ref 6.5–8.1)

## 2018-04-27 LAB — URINALYSIS, ROUTINE W REFLEX MICROSCOPIC
Bacteria, UA: NONE SEEN
Bilirubin Urine: NEGATIVE
Glucose, UA: NEGATIVE mg/dL
Hgb urine dipstick: NEGATIVE
Ketones, ur: NEGATIVE mg/dL
Leukocytes, UA: NEGATIVE
Nitrite: NEGATIVE
Protein, ur: NEGATIVE mg/dL
Specific Gravity, Urine: 1.011 (ref 1.005–1.030)
pH: 7 (ref 5.0–8.0)

## 2018-04-27 LAB — I-STAT TROPONIN, ED: Troponin i, poc: 0.01 ng/mL (ref 0.00–0.08)

## 2018-04-27 LAB — LIPASE, BLOOD: Lipase: 34 U/L (ref 11–51)

## 2018-04-27 MED ORDER — SODIUM CHLORIDE 0.9 % IV BOLUS
500.0000 mL | Freq: Once | INTRAVENOUS | Status: AC
  Administered 2018-04-27: 500 mL via INTRAVENOUS

## 2018-04-27 MED ORDER — ONDANSETRON HCL 4 MG/2ML IJ SOLN
4.0000 mg | Freq: Once | INTRAMUSCULAR | Status: AC
  Administered 2018-04-27: 4 mg via INTRAVENOUS
  Filled 2018-04-27: qty 2

## 2018-04-27 NOTE — ED Notes (Signed)
ED Provider at bedside. 

## 2018-04-27 NOTE — ED Provider Notes (Signed)
Emergency Department Provider Note   I have reviewed the triage vital signs and the nursing notes.   HISTORY  Chief Complaint Chest Pain   HPI Amanda Butler is a 63 y.o. female with PMH of CHF, HLD, HTN, and DM presents emergency department for evaluation of multiple complaints including generalized weakness, nausea, epigastric pain radiating to the chest, and near syncope.  The symptoms have been ongoing for the past several weeks.  The patient's chest pain began 2 days ago and has been constant.  No significant modifying factors.  No productive cough, fevers, chills.  Patient states that she typically has symptoms with exertion.  No pleuritic chest pain.  She denies any dysuria, hesitancy, urgency.  Patient states that she lives at home alone and has difficulty caring for herself there due to mobility issues.  She has interfaced with home health and social services but does not qualify for these programs.  Patient does follow closely with her primary care physician and has been compliant with her home medications.  No weakness or numbness.     Past Medical History:  Diagnosis Date  . Arthritis    "knees" (09/13/2015)  . Asthma   . Cellulitis   . Chronic diastolic CHF (congestive heart failure) ([place])    a. 09/2015 Echo: EF 55-60%, mild LVH, gr2DD, no rwma, mild MR, mildly dil LA.   Marland Kitchen GERD (gastroesophageal reflux disease)   . High cholesterol   . Hypertension   . Midsternal chest pain    a. 09/2015 MV: no ischemia/infarct, EF 61%; b. 09/2015 Low prob V:Q scan.  . Morbid obesity ([place])   . Obesity   . Pneumonia ~ 2000   "mild case"  . RBBB (right bundle branch block) approx 2010   Dr Marisue Humble did ECG and told her  . Type II diabetes mellitus [facility])     Patient Active Problem List   Diagnosis Date Noted  . Pressure injury of skin 02/20/2018  . Cellulitis 10/02/2017  . 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  . Closed nondisplaced fracture of  greater tuberosity of left humerus 04/23/2017  . Type II diabetes mellitus ([place])   . High cholesterol   . Morbid obesity ([place])   . Chronic diastolic CHF (congestive heart failure) ([place])   . Midsternal chest pain   . Pain in the chest   . Essential hypertension   . Morbid obesity due to excess calories ([place])   . Chest pain 09/13/2015  . Bilateral lower extremity edema 09/13/2015  . Diabetes mellitus type 2, diet-controlled ([place]) 09/13/2015  . Obesity 09/13/2015  . Total bilirubin, elevated 09/13/2015  . HTN (hypertension) 09/13/2015  . Stasis dermatitis of both legs 09/13/2015  . Left leg cellulitis 09/13/2015  . RBBB 09/13/2015  . Asthma 09/13/2015  . GERD (gastroesophageal reflux disease) 09/13/2015  . 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    Past Surgical History:  Procedure Laterality Date  . BUNIONECTOMY Left   . COLONOSCOPY WITH PROPOFOL N/A 09/23/2016   Procedure: COLONOSCOPY WITH PROPOFOL;  Surgeon: Garlan Fair, MD;  Location: [facility];  Service: Endoscopy;  Laterality: N/A;  . MULTIPLE TOOTH EXTRACTIONS  ~ 1975  . TENDON RELEASE Right    Leg  . TUBAL LIGATION  ~ 1983   Allergies Bee venom; Iodine; Shellfish allergy; Actos [pioglitazone]; Aspirin; Latex; Metformin and related; Morphine and related; Nsaids; and Ciprofloxacin  Family History  Problem Relation Age of Onset  . Diabetes Mother   . Hypertension  Mother   . Diabetes Father   . Hypertension Father   . Heart attack Father 54  . Diabetes Maternal Aunt   . Diabetes Maternal Grandmother   . Hypertension Maternal Grandmother   . Cancer Maternal Grandfather   . Diabetes Maternal Grandfather   . Hypertension Maternal Grandfather   . Asthma Paternal Grandmother   . Diabetes Paternal Grandmother   . Hypertension Paternal Grandmother   . Diabetes Paternal Grandfather   . Hypertension Paternal Grandfather   . Heart failure Paternal Grandfather     Social History Social History   Tobacco Use  .  Smoking status: Never Smoker  . Smokeless tobacco: Never Used  Substance Use Topics  . Alcohol use: No  . Drug use: No    Review of Systems  Constitutional: No fever/chills. Positive generalized weakness.  Eyes: No visual changes. ENT: No sore throat. Cardiovascular: Positive chest pain and intermittent near syncope.  Respiratory: Denies shortness of breath. Positive dry cough.  Gastrointestinal: No abdominal pain. No nausea, no vomiting.  No diarrhea.  No constipation. Genitourinary: Negative for dysuria. Musculoskeletal: Negative for back pain. Skin: Negative for rash. Neurological: Negative for headaches, focal weakness. Numbness in the 1st-3rd digits of the left hand x months.   10-point ROS otherwise negative.  ____________________________________________   PHYSICAL EXAM:  VITAL SIGNS: ED Triage Vitals [04/27/18 1532]  Enc Vitals Group     BP (!) 188/93     Pulse Rate 69     Resp 18     Temp 98.7 F (37.1 C)     Temp Source Oral     SpO2 99 %   Constitutional: Alert and oriented. Well appearing and in no acute distress. Eyes: Conjunctivae are normal.  Head: Atraumatic. Nose: No congestion/rhinnorhea. Mouth/Throat: Mucous membranes are moist. Neck: No stridor.  Cardiovascular: Normal rate, regular rhythm. Good peripheral circulation. Grossly normal heart sounds.   Respiratory: Normal respiratory effort.  No retractions. Lungs CTAB. Gastrointestinal: Soft and nontender. No distention.  Musculoskeletal: No lower extremity tenderness with bilateral 3+ pitting edema. No open wounds or ulcerations. No gross deformities of extremities. Neurologic:  Normal speech and language. Normal CN exam 2-12. Normal strength and sensation in the bilateral upper and lower extremities.  Skin:  Skin is warm, dry and intact. No rash noted.  ____________________________________________   LABS (all labs ordered are listed, but only abnormal results are displayed)  Labs Reviewed    CBC - Abnormal; Notable for the following components:      Result Value   RBC 5.52 (*)    Hemoglobin 15.8 (*)    HCT 48.3 (*)    All other components within normal limits  COMPREHENSIVE METABOLIC PANEL - Abnormal; Notable for the following components:   CO2 20 (*)    Glucose, Bld 143 (*)    Total Bilirubin 1.4 (*)    All other components within normal limits  LIPASE, BLOOD  URINALYSIS, ROUTINE W REFLEX MICROSCOPIC  I-STAT TROPONIN, ED   ____________________________________________  EKG   EKG Interpretation  Date/Time:  Tuesday April 27 2018 18:01:34 EST Ventricular Rate:  60 PR Interval:  174 QRS Duration: 137 QT Interval:  434 QTC Calculation: 434 R Axis:   33 Text Interpretation:  Sinus rhythm Right bundle branch block No STEMI. Similar to prior.  Confirmed by Nanda Quinton [phone]) on 04/27/2018 6:03:59 PM       ____________________________________________  RADIOLOGY  Dg Chest 2 View  Result Date: 04/27/2018 CLINICAL DATA:  LEFT-sided chest pain radiating  into the LEFT side of the neck and LEFT UPPER extremity associated with numbness in the LEFT hand that began 2 days ago but worsened today. EXAM: CHEST - 2 VIEW COMPARISON:  03/16/2018 and earlier. FINDINGS: Cardiomediastinal silhouette unremarkable and unchanged. Lungs clear. Bronchovascular markings normal. Pulmonary vascularity normal. No visible pleural effusions. No pneumothorax. Degenerative changes involving the thoracic spine and thoracic levoscoliosis. IMPRESSION: No acute cardiopulmonary disease. Electronically Signed   By: Evangeline Dakin M.D.   On: 04/27/2018 16:05    ____________________________________________   PROCEDURES  Procedure(s) performed:   Procedures  None ____________________________________________   INITIAL IMPRESSION / ASSESSMENT AND PLAN / ED COURSE  Pertinent labs & imaging results that were available during my care of the patient were reviewed by me and considered in my  medical decision making (see chart for details).  Presents to the emergency department with generalized weakness, constant chest pain for several days.  Symptoms seem more chronic in nature.  Her EKG and troponin are not concerning for acute coronary syndrome.  With constant pain for several days would expect new findings here.  Patient was admitted in November for chest pain with normal work-up at that time.  She was evaluated for PE and had DVT of the left lower extremity.  Patient has no focal neurological deficits to suspect stroke.  No signs or symptoms to suspect developing sepsis.  Is in a poor social situation with little family support.  I offered home health services but she states that she has had this in the past and her insurance does not cover it because she makes too much money.  Plan for IV fluids and Zofran given some mild nausea.  Urinalysis is pending.  06:35 PM UA negative for UTI. Troponin is negative. I placed an order for home health and social work to call the patient and discuss her situation. Advised close PCP and Cardiology follow up. Patient to call both providers tomorrow. Numbers provided at discharge.  ____________________________________________  FINAL CLINICAL IMPRESSION(S) / ED DIAGNOSES  Final diagnoses:  Precordial chest pain  Generalized weakness     MEDICATIONS GIVEN DURING THIS VISIT:  Medications  sodium chloride 0.9 % bolus 500 mL (500 mLs Intravenous New Bag/Given 04/27/18 1754)  ondansetron (ZOFRAN) injection 4 mg (4 mg Intravenous Given 04/27/18 1754)    Note:  This document was prepared using Dragon voice recognition software and may include unintentional dictation errors.  Nanda Quinton, MD Emergency Medicine    Kimerly Rowand, [facility], MD 04/27/18 [phone]

## 2018-04-27 NOTE — ED Triage Notes (Signed)
Pt here with chest pain that goes into her abdomen and also nausea.  Symptoms for last month.  A&Ox4

## 2018-04-27 NOTE — Discharge Instructions (Signed)
You have been seen in the Emergency Department (ED) today for chest pain.  As we have discussed todays test results are normal, but you may require further testing.  I have placed an order for home health. Someone will be in touch to discuss if this is possible for you given you insurance coverage.   Please follow up with the recommended doctor as instructed above in these documents regarding todays emergent visit and your recent symptoms to discuss further management.  Continue to take your regular medications.   Return to the Emergency Department (ED) if you experience any further chest pain/pressure/tightness, difficulty breathing, or sudden sweating, or other symptoms that concern you.   Chest Pain (Nonspecific) It is often hard to give a specific diagnosis for the cause of chest pain. There is always a chance that your pain could be related to something serious, such as a heart attack or a blood clot in the lungs. You need to follow up with your health care provider for further evaluation. CAUSES  Heartburn. Pneumonia or bronchitis. Anxiety or stress. Inflammation around your heart (pericarditis) or lung (pleuritis or pleurisy). A blood clot in the lung. A collapsed lung (pneumothorax). It can develop suddenly on its own (spontaneous pneumothorax) or from trauma to the chest. Shingles infection (herpes zoster virus). The chest wall is composed of bones, muscles, and cartilage. Any of these can be the source of the pain. The bones can be bruised by injury. The muscles or cartilage can be strained by coughing or overwork. The cartilage can be affected by inflammation and become sore (costochondritis). DIAGNOSIS  Lab tests or other studies may be needed to find the cause of your pain. Your health care provider may have you take a test called an ambulatory electrocardiogram (ECG). An ECG records your heartbeat patterns over a 24-hour period. You may also have other tests, such  as: Transthoracic echocardiogram (TTE). During echocardiography, sound waves are used to evaluate how blood flows through your heart. Transesophageal echocardiogram (TEE). Cardiac monitoring. This allows your health care provider to monitor your heart rate and rhythm in real time. Holter monitor. This is a portable device that records your heartbeat and can help diagnose heart arrhythmias. It allows your health care provider to track your heart activity for several days, if needed. Stress tests by exercise or by giving medicine that makes the heart beat faster. TREATMENT  Treatment depends on what may be causing your chest pain. Treatment may include: Acid blockers for heartburn. Anti-inflammatory medicine. Pain medicine for inflammatory conditions. Antibiotics if an infection is present. You may be advised to change lifestyle habits. This includes stopping smoking and avoiding alcohol, caffeine, and chocolate. You may be advised to keep your head raised (elevated) when sleeping. This reduces the chance of acid going backward from your stomach into your esophagus. Most of the time, nonspecific chest pain will improve within 2-3 days with rest and mild pain medicine.  HOME CARE INSTRUCTIONS  If antibiotics were prescribed, take them as directed. Finish them even if you start to feel better. For the next few days, avoid physical activities that bring on chest pain. Continue physical activities as directed. Do not use any tobacco products, including cigarettes, chewing tobacco, or electronic cigarettes. Avoid drinking alcohol. Only take medicine as directed by your health care provider. Follow your health care provider's suggestions for further testing if your chest pain does not go away. Keep any follow-up appointments you made. If you do not go to an appointment, you  could develop lasting (chronic) problems with pain. If there is any problem keeping an appointment, call to reschedule. SEEK  MEDICAL CARE IF:  Your chest pain does not go away, even after treatment. You have a rash with blisters on your chest. You have a fever. SEEK IMMEDIATE MEDICAL CARE IF:  You have increased chest pain or pain that spreads to your arm, neck, jaw, back, or abdomen. You have shortness of breath. You have an increasing cough, or you cough up blood. You have severe back or abdominal pain. You feel nauseous or vomit. You have severe weakness. You faint. You have chills. This is an emergency. Do not wait to see if the pain will go away. Get medical help at once. Call your local emergency services (911 in [place] Do not drive yourself to the hospital. MAKE SURE YOU:  Understand these instructions. Will watch your condition. Will get help right away if you are not doing well or get worse. Document Released: 01/08/2005 Document Revised: 04/05/2013 Document Reviewed: 11/04/2007 [facility] Patient Information 2015 [place], [place]. This information is not intended to replace advice given to you by your health care provider. Make sure you discuss any questions you have with your health care provider.

## 2018-05-08 ENCOUNTER — Other Ambulatory Visit: Payer: Self-pay

## 2018-05-08 ENCOUNTER — Emergency Department (HOSPITAL_COMMUNITY): Payer: BLUE CROSS/BLUE SHIELD

## 2018-05-08 ENCOUNTER — Emergency Department (HOSPITAL_COMMUNITY)
Admission: EM | Admit: 2018-05-08 | Discharge: 2018-05-08 | Disposition: A | Discharge status: Home / Self Care | Payer: BLUE CROSS/BLUE SHIELD | Attending: Emergency Medicine | Admitting: Emergency Medicine

## 2018-05-08 ENCOUNTER — Encounter (HOSPITAL_COMMUNITY): Payer: Self-pay

## 2018-05-08 DIAGNOSIS — R509 Fever, unspecified: Secondary | ICD-10-CM | POA: Diagnosis present

## 2018-05-08 DIAGNOSIS — B349 Viral infection, unspecified: Secondary | ICD-10-CM

## 2018-05-08 DIAGNOSIS — Z79899 Other long term (current) drug therapy: Secondary | ICD-10-CM | POA: Insufficient documentation

## 2018-05-08 DIAGNOSIS — I110 Hypertensive heart disease with heart failure: Secondary | ICD-10-CM | POA: Insufficient documentation

## 2018-05-08 DIAGNOSIS — Z91040 Latex allergy status: Secondary | ICD-10-CM | POA: Diagnosis not present

## 2018-05-08 DIAGNOSIS — I5032 Chronic diastolic (congestive) heart failure: Secondary | ICD-10-CM | POA: Diagnosis not present

## 2018-05-08 DIAGNOSIS — E119 Type 2 diabetes mellitus without complications: Secondary | ICD-10-CM | POA: Insufficient documentation

## 2018-05-08 LAB — CBC WITH DIFFERENTIAL/PLATELET
Abs Immature Granulocytes: 0.02 10*3/uL (ref 0.00–0.07)
Basophils Absolute: 0 10*3/uL (ref 0.0–0.1)
Basophils Relative: 1 %
Eosinophils Absolute: 0.1 10*3/uL (ref 0.0–0.5)
Eosinophils Relative: 2 %
HCT: 44.1 % (ref 36.0–46.0)
Hemoglobin: 14.2 g/dL (ref 12.0–15.0)
Immature Granulocytes: 0 %
Lymphocytes Relative: 19 %
Lymphs Abs: 1.2 10*3/uL (ref 0.7–4.0)
MCH: 28.3 pg (ref 26.0–34.0)
MCHC: 32.2 g/dL (ref 30.0–36.0)
MCV: 87.8 fL (ref 80.0–100.0)
Monocytes Absolute: 0.4 10*3/uL (ref 0.1–1.0)
Monocytes Relative: 7 %
Neutro Abs: 4.6 10*3/uL (ref 1.7–7.7)
Neutrophils Relative %: 71 %
Platelets: 181 10*3/uL (ref 150–400)
RBC: 5.02 MIL/uL (ref 3.87–5.11)
RDW: 13.2 % (ref 11.5–15.5)
WBC: 6.4 10*3/uL (ref 4.0–10.5)
nRBC: 0 % (ref 0.0–0.2)

## 2018-05-08 LAB — BASIC METABOLIC PANEL
Anion gap: 11 (ref 5–15)
BUN: 15 mg/dL (ref 8–23)
CO2: 24 mmol/L (ref 22–32)
Calcium: 8.8 mg/dL — ABNORMAL LOW (ref 8.9–10.3)
Chloride: 102 mmol/L (ref 98–111)
Creatinine, Ser: 0.92 mg/dL (ref 0.44–1.00)
GFR calc Af Amer: >60 mL/min (ref >60)
GFR calc non Af Amer: >60 mL/min (ref >60)
Glucose, Bld: 319 mg/dL — ABNORMAL HIGH (ref 70–99)
Potassium: 3.8 mmol/L (ref 3.5–5.1)
Sodium: 137 mmol/L (ref 135–145)

## 2018-05-08 LAB — INFLUENZA PANEL BY PCR (TYPE A & B)
Influenza A By PCR: NEGATIVE
Influenza B By PCR: NEGATIVE

## 2018-05-08 MED ORDER — SODIUM CHLORIDE 0.9 % IV BOLUS
250.0000 mL | Freq: Once | INTRAVENOUS | Status: AC
  Administered 2018-05-08: 250 mL via INTRAVENOUS

## 2018-05-08 MED ORDER — ONDANSETRON 8 MG PO TBDP: 8.0000 mg | ORAL_TABLET | Freq: Three times a day (TID) | ORAL | 0 refills | Status: DC | PRN

## 2018-05-08 MED ORDER — METOPROLOL TARTRATE 5 MG/5ML IV SOLN: 5.0000 mg | Freq: Once | INTRAVENOUS | Status: DC

## 2018-05-08 NOTE — ED Notes (Signed)
Patient verbalizes understanding of discharge instructions. Opportunity for questioning and answers were provided. Armband removed by staff, pt discharged from ED.  

## 2018-05-08 NOTE — ED Provider Notes (Signed)
[facility] EMERGENCY DEPARTMENT Provider Note   CSN: [id number] Arrival date & time: 05/08/18  1640     History   Chief Complaint Chief Complaint  Patient presents with  . Fever  . Nausea  . Diarrhea    HPI Amanda Butler is a 63 y.o. female.  HPI  63 year old female morbidly obese, CHF, 2 diabetes who presents today complaining of body aches, myalgias, fatigue, cough, nausea, 2 episodes of emesis, and 2 episodes of loose bowel movement with symptom onset last night.  She has been eating and drinking without difficulty today.  She reports fever up to 100.  She has some headache, nasal congestion, and sore throat.  She has been coughing with some thick sputum production.  She has had some mild dyspnea.  She has had nausea with 2 episodes of vomiting and 2 loose stools.  She denies abdominal pain.  She has some chronic erythema of her toes with some chronic skin condition.  Reports no new wounds.  She had her flu shot this year and reports Pneumovax last year.  Past Medical History:  Diagnosis Date  . Arthritis    "knees" (09/13/2015)  . Asthma   . Cellulitis   . Chronic diastolic CHF (congestive heart failure) ([place])    a. 09/2015 Echo: EF 55-60%, mild LVH, gr2DD, no rwma, mild MR, mildly dil LA.   Marland Kitchen GERD (gastroesophageal reflux disease)   . High cholesterol   . Hypertension   . Midsternal chest pain    a. 09/2015 MV: no ischemia/infarct, EF 61%; b. 09/2015 Low prob V:Q scan.  . Morbid obesity ([place])   . Obesity   . Pneumonia ~ 2000   "mild case"  . RBBB (right bundle branch block) approx 2010   Dr Marisue Humble did ECG and told her  . Type II diabetes mellitus [facility])     Patient Active Problem List   Diagnosis Date Noted  . Pressure injury of skin 02/20/2018  . Cellulitis 10/02/2017  . 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  . Closed nondisplaced fracture of greater tuberosity of left humerus 04/23/2017  . Type II diabetes  mellitus ([place])   . High cholesterol   . Morbid obesity ([place])   . Chronic diastolic CHF (congestive heart failure) ([place])   . Midsternal chest pain   . Pain in the chest   . Essential hypertension   . Morbid obesity due to excess calories ([place])   . Chest pain 09/13/2015  . Bilateral lower extremity edema 09/13/2015  . Diabetes mellitus type 2, diet-controlled ([place]) 09/13/2015  . Obesity 09/13/2015  . Total bilirubin, elevated 09/13/2015  . HTN (hypertension) 09/13/2015  . Stasis dermatitis of both legs 09/13/2015  . Left leg cellulitis 09/13/2015  . RBBB 09/13/2015  . Asthma 09/13/2015  . GERD (gastroesophageal reflux disease) 09/13/2015  . 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    Past Surgical History:  Procedure Laterality Date  . BUNIONECTOMY Left   . COLONOSCOPY WITH PROPOFOL N/A 09/23/2016   Procedure: COLONOSCOPY WITH PROPOFOL;  Surgeon: Garlan Fair, MD;  Location: [facility];  Service: Endoscopy;  Laterality: N/A;  . MULTIPLE TOOTH EXTRACTIONS  ~ 1975  . TENDON RELEASE Right    Leg  . TUBAL LIGATION  ~ 1983     OB History   No obstetric history on file.      Home Medications    Prior to Admission medications   Medication Sig Start Date End Date Taking? Authorizing  Provider  acetaminophen (TYLENOL) 500 MG tablet Take 1,000 mg by mouth every 6 (six) hours as needed.    [provider]  albuterol (PROVENTIL HFA;VENTOLIN HFA) 108 (90 BASE) MCG/ACT inhaler Inhale 2 puffs into the lungs 2 (two) times daily as needed for wheezing or shortness of breath.     [provider]  albuterol (PROVENTIL) (2.5 MG/3ML) 0.083% nebulizer solution Take 2.5 mg by nebulization every 6 (six) hours as needed for wheezing or shortness of breath.    [provider]  famotidine (PEPCID) 10 MG tablet Take 10 mg by mouth daily as needed for heartburn.     [provider]  glimepiride (AMARYL) 2 MG tablet Take 2 mg by mouth daily. 04/07/18   [provider]  JANUVIA 100 MG tablet Take 100 mg by mouth daily. 07/02/16   [provider]  lisinopril (PRINIVIL,ZESTRIL) 10 MG tablet Take 1 tablet (10 mg total) by mouth daily. 07/08/16   Croitoru, Mihai, MD  metoprolol succinate (TOPROL-XL) 50 MG 24 hr tablet Take 50 mg by mouth daily. Take with or immediately following a meal.    [provider]  mometasone (NASONEX) 50 MCG/ACT nasal spray Place 2 sprays into the nose daily.     [provider]  ondansetron (ZOFRAN) 4 MG tablet Take 1 tablet (4 mg total) by mouth every 6 (six) hours. Patient not taking: Reported on 04/27/2018 03/17/18   Nat Christen, MD  pravastatin (PRAVACHOL) 20 MG tablet Take 20 mg by mouth daily.    [provider]  triamterene-hydrochlorothiazide (MAXZIDE) 75-50 MG per tablet Take 1 tablet by mouth daily.    [provider]    Family History Family History  Problem Relation Age of Onset  . Diabetes Mother   . Hypertension Mother   . Diabetes Father   . Hypertension Father   . Heart attack Father 47  . Diabetes Maternal Aunt   . Diabetes Maternal Grandmother   . Hypertension Maternal Grandmother   . Cancer Maternal Grandfather   . Diabetes Maternal Grandfather   . Hypertension Maternal Grandfather   . Asthma Paternal Grandmother   . Diabetes Paternal Grandmother   . Hypertension Paternal Grandmother   . Diabetes Paternal Grandfather   . Hypertension Paternal Grandfather   . Heart failure Paternal Grandfather     Social History Social History   Tobacco Use  . Smoking status: Never Smoker  . Smokeless tobacco: Never Used  Substance Use Topics  . Alcohol use: No  . Drug use: No     Allergies   Bee venom; Iodine; Shellfish allergy; Actos [pioglitazone]; Aspirin; Latex; Metformin and related; Morphine and related; Nsaids; and Ciprofloxacin   Review of Systems Review of Systems  All other systems reviewed and are negative.    Physical Exam Updated  Vital Signs BP (!) 201/83 (BP Location: Right Arm)   Pulse (!) 111   Temp 99.2 F (37.3 C) (Oral)   Resp 20   SpO2 98%   Physical Exam Vitals signs and nursing note reviewed. Exam conducted with a chaperone present.  Constitutional:      General: She is not in acute distress.    Appearance: She is obese. She is ill-appearing. She is not toxic-appearing.  HENT:     Head: Normocephalic and atraumatic.     Right Ear: External ear normal.     Left Ear: External ear normal.     Nose: Nose normal.     Mouth/Throat:  Mouth: Mucous membranes are dry.  Eyes:     Pupils: Pupils are equal, round, and reactive to light.  Neck:     Musculoskeletal: Normal range of motion.  Cardiovascular:     Rate and Rhythm: Normal rate and regular rhythm.  Pulmonary:     Effort: Pulmonary effort is normal.     Breath sounds: Normal breath sounds.  Abdominal:     General: Bowel sounds are normal. There is no distension.     Palpations: Abdomen is soft.     Tenderness: There is no abdominal tenderness.  Musculoskeletal: Normal range of motion.     Comments: Chronic venous stasis dermatitis bilateral lower extremities Skin changes in skin folds with pannus to thigh consistent with intertriginous dermatitis  Skin:    Capillary Refill: Capillary refill takes less than 2 seconds.     Comments: Chronic skin changes as noted in musculoskeletal exam with changes noted in her  skin folds of abdominal pannus  Neurological:     General: No focal deficit present.     Mental Status: She is alert.     Cranial Nerves: No cranial nerve deficit.     Motor: No weakness.     Deep Tendon Reflexes: Reflexes normal.  Psychiatric:        Mood and Affect: Mood normal.      ED Treatments / Results  Labs (all labs ordered are listed, but only abnormal results are displayed) Labs Reviewed  CBC WITH DIFFERENTIAL/PLATELET  BASIC METABOLIC PANEL  INFLUENZA PANEL BY PCR (TYPE A & B)    EKG None  Radiology Dg  Chest Port 1 View  Result Date: 05/08/2018 CLINICAL DATA:  Myalgias, fever, productive cough, nausea, vomiting and diarrhea for several days. EXAM: PORTABLE CHEST 1 VIEW COMPARISON:  04/27/2000 when E. FINDINGS: Stable mildly enlarged cardiac silhouette and mildly prominent pulmonary vasculature and interstitial markings. No pleural fluid. Thoracic spine and bilateral AC joint degenerative changes. IMPRESSION: Stable mild cardiomegaly, mild pulmonary vascular congestion and mild chronic interstitial lung disease. Electronically Signed   By: Claudie Revering M.D.   On: 05/08/2018 17:31    Procedures Procedures (including critical care time)  Medications Ordered in ED Medications  sodium chloride 0.9 % bolus 250 mL (has no administration in time range)     Initial Impression / Assessment and Plan / ED Course  I have reviewed the triage vital signs and the nursing notes.  Pertinent labs & imaging results that were available during my care of the patient were reviewed by me and considered in my medical decision making (see chart for details).   This is a 63 year old female presents today with respiratory infection symptoms.  She is mildly tachycardic on initial evaluation.  However heart rate has decreased since that time.  She is also hypertensive.  Presentation symptoms consistent with infectious etiology.  Chest x-Evaristo Tsuda shows no evidence of acute infiltrate.  Flu is pending.  If flu is positive, plan Tamiflu. Give additional dose of blood pressure medicine here.   Final Clinical Impressions(s) / ED Diagnoses   Final diagnoses:  Viral infection    ED Discharge Orders    None       Pattricia Boss, MD 05/08/18 [phone]

## 2018-05-08 NOTE — ED Triage Notes (Signed)
Pt presents with generalized body aches, fever, productive cough, NVD for several days. Pt states she has received her flu and pneumonia vaccines this year. Pt endorses home temp of 101.15F. Pt endorses hx of CHF, DM.

## 2018-05-08 NOTE — Discharge Instructions (Addendum)
Please drink clear liquids.  Use Zofran as needed for nausea Please recheck blood pressure your doctor on Monday

## 2018-05-19 ENCOUNTER — Emergency Department (HOSPITAL_COMMUNITY): Payer: BLUE CROSS/BLUE SHIELD

## 2018-05-19 ENCOUNTER — Telehealth: Payer: Self-pay

## 2018-05-19 ENCOUNTER — Emergency Department (HOSPITAL_COMMUNITY)
Admission: EM | Admit: 2018-05-19 | Discharge: 2018-05-19 | Disposition: A | Discharge status: Home / Self Care | Payer: BLUE CROSS/BLUE SHIELD | Attending: Emergency Medicine | Admitting: Emergency Medicine

## 2018-05-19 ENCOUNTER — Encounter (HOSPITAL_COMMUNITY): Payer: Self-pay

## 2018-05-19 DIAGNOSIS — Z7984 Long term (current) use of oral hypoglycemic drugs: Secondary | ICD-10-CM | POA: Diagnosis not present

## 2018-05-19 DIAGNOSIS — E119 Type 2 diabetes mellitus without complications: Secondary | ICD-10-CM | POA: Diagnosis not present

## 2018-05-19 DIAGNOSIS — Z79899 Other long term (current) drug therapy: Secondary | ICD-10-CM | POA: Diagnosis not present

## 2018-05-19 DIAGNOSIS — I5032 Chronic diastolic (congestive) heart failure: Secondary | ICD-10-CM | POA: Insufficient documentation

## 2018-05-19 DIAGNOSIS — J45909 Unspecified asthma, uncomplicated: Secondary | ICD-10-CM | POA: Insufficient documentation

## 2018-05-19 DIAGNOSIS — I252 Old myocardial infarction: Secondary | ICD-10-CM | POA: Insufficient documentation

## 2018-05-19 DIAGNOSIS — I110 Hypertensive heart disease with heart failure: Secondary | ICD-10-CM | POA: Diagnosis not present

## 2018-05-19 DIAGNOSIS — R600 Localized edema: Secondary | ICD-10-CM | POA: Diagnosis not present

## 2018-05-19 DIAGNOSIS — R072 Precordial pain: Secondary | ICD-10-CM | POA: Diagnosis not present

## 2018-05-19 DIAGNOSIS — N3090 Cystitis, unspecified without hematuria: Secondary | ICD-10-CM

## 2018-05-19 DIAGNOSIS — Z91040 Latex allergy status: Secondary | ICD-10-CM | POA: Insufficient documentation

## 2018-05-19 DIAGNOSIS — R079 Chest pain, unspecified: Secondary | ICD-10-CM | POA: Diagnosis present

## 2018-05-19 LAB — CBC WITH DIFFERENTIAL/PLATELET
Abs Immature Granulocytes: 0.03 10*3/uL (ref 0.00–0.07)
Basophils Absolute: 0.1 10*3/uL (ref 0.0–0.1)
Basophils Relative: 1 %
Eosinophils Absolute: 0.2 10*3/uL (ref 0.0–0.5)
Eosinophils Relative: 2 %
HCT: 48.5 % — ABNORMAL HIGH (ref 36.0–46.0)
Hemoglobin: 15.8 g/dL — ABNORMAL HIGH (ref 12.0–15.0)
Immature Granulocytes: 0 %
Lymphocytes Relative: 16 %
Lymphs Abs: 1.7 10*3/uL (ref 0.7–4.0)
MCH: 28.6 pg (ref 26.0–34.0)
MCHC: 32.6 g/dL (ref 30.0–36.0)
MCV: 87.9 fL (ref 80.0–100.0)
Monocytes Absolute: 0.6 10*3/uL (ref 0.1–1.0)
Monocytes Relative: 6 %
Neutro Abs: 8.1 10*3/uL — ABNORMAL HIGH (ref 1.7–7.7)
Neutrophils Relative %: 75 %
Platelets: 244 10*3/uL (ref 150–400)
RBC: 5.52 MIL/uL — ABNORMAL HIGH (ref 3.87–5.11)
RDW: 13.4 % (ref 11.5–15.5)
WBC: 10.7 10*3/uL — ABNORMAL HIGH (ref 4.0–10.5)
nRBC: 0 % (ref 0.0–0.2)

## 2018-05-19 LAB — URINALYSIS, ROUTINE W REFLEX MICROSCOPIC
Bilirubin Urine: NEGATIVE
Glucose, UA: >=500 mg/dL — AB
Hgb urine dipstick: NEGATIVE
Ketones, ur: NEGATIVE mg/dL
Nitrite: NEGATIVE
Protein, ur: NEGATIVE mg/dL
Specific Gravity, Urine: 1.008 (ref 1.005–1.030)
pH: 7 (ref 5.0–8.0)

## 2018-05-19 LAB — BASIC METABOLIC PANEL
Anion gap: 13 (ref 5–15)
BUN: 13 mg/dL (ref 8–23)
CO2: 23 mmol/L (ref 22–32)
Calcium: 9.6 mg/dL (ref 8.9–10.3)
Chloride: 100 mmol/L (ref 98–111)
Creatinine, Ser: 0.93 mg/dL (ref 0.44–1.00)
GFR calc Af Amer: >60 mL/min (ref >60)
GFR calc non Af Amer: >60 mL/min (ref >60)
Glucose, Bld: 262 mg/dL — ABNORMAL HIGH (ref 70–99)
Potassium: 4.2 mmol/L (ref 3.5–5.1)
Sodium: 136 mmol/L (ref 135–145)

## 2018-05-19 LAB — TROPONIN I: Troponin I: <0.03 ng/mL (ref <0.03)

## 2018-05-19 MED ORDER — CEPHALEXIN 500 MG PO CAPS: 500.0000 mg | ORAL_CAPSULE | Freq: Four times a day (QID) | ORAL | 0 refills | Status: DC

## 2018-05-19 NOTE — ED Notes (Signed)
Pt returned from xray

## 2018-05-19 NOTE — Telephone Encounter (Signed)
Called to schedule initial care guide visit. Left vm 

## 2018-05-19 NOTE — ED Provider Notes (Signed)
[facility] EMERGENCY DEPARTMENT Provider Note   CSN: [id number] Arrival date & time: 05/19/18  1548     History   Chief Complaint Chief Complaint  Patient presents with  . Chest Pain  . Shortness of Breath    HPI ROMANITA FAGER is a 63 y.o. female.  HPI Patient presents with chest pains.  Has had last couple days.  On the left chest.  No nausea or vomiting.  Has had a cough with some shortness of breath.  No fevers.  No abdominal pain.  Unknown sick contacts.  States she feels bad all over.  States she want to make sure it was not her heart.  States she had a previous heart attack. Past Medical History:  Diagnosis Date  . Arthritis    "knees" (09/13/2015)  . Asthma   . Cellulitis   . Chronic diastolic CHF (congestive heart failure) ([place])    a. 09/2015 Echo: EF 55-60%, mild LVH, gr2DD, no rwma, mild MR, mildly dil LA.   Marland Kitchen GERD (gastroesophageal reflux disease)   . High cholesterol   . Hypertension   . Midsternal chest pain    a. 09/2015 MV: no ischemia/infarct, EF 61%; b. 09/2015 Low prob V:Q scan.  . Morbid obesity ([place])   . Obesity   . Pneumonia ~ 2000   "mild case"  . RBBB (right bundle branch block) approx 2010   Dr Marisue Humble did ECG and told her  . Type II diabetes mellitus [facility])     Patient Active Problem List   Diagnosis Date Noted  . Pressure injury of skin 02/20/2018  . Cellulitis 10/02/2017  . 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  . Closed nondisplaced fracture of greater tuberosity of left humerus 04/23/2017  . Type II diabetes mellitus ([place])   . High cholesterol   . Morbid obesity ([place])   . Chronic diastolic CHF (congestive heart failure) ([place])   . Midsternal chest pain   . Pain in the chest   . Essential hypertension   . Morbid obesity due to excess calories ([place])   . Chest pain 09/13/2015  . Bilateral lower extremity edema 09/13/2015  . Diabetes mellitus type 2, diet-controlled ([place]) 09/13/2015  .  Obesity 09/13/2015  . Total bilirubin, elevated 09/13/2015  . HTN (hypertension) 09/13/2015  . Stasis dermatitis of both legs 09/13/2015  . Left leg cellulitis 09/13/2015  . RBBB 09/13/2015  . Asthma 09/13/2015  . GERD (gastroesophageal reflux disease) 09/13/2015  . 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    Past Surgical History:  Procedure Laterality Date  . BUNIONECTOMY Left   . COLONOSCOPY WITH PROPOFOL N/A 09/23/2016   Procedure: COLONOSCOPY WITH PROPOFOL;  Surgeon: Garlan Fair, MD;  Location: [facility];  Service: Endoscopy;  Laterality: N/A;  . MULTIPLE TOOTH EXTRACTIONS  ~ 1975  . TENDON RELEASE Right    Leg  . TUBAL LIGATION  ~ 1983     OB History   No obstetric history on file.      Home Medications    Prior to Admission medications   Medication Sig Start Date End Date Taking? Authorizing Provider  acetaminophen (TYLENOL) 500 MG tablet Take 1,000 mg by mouth every 6 (six) hours as needed.    [provider]  albuterol (PROVENTIL HFA;VENTOLIN HFA) 108 (90 BASE) MCG/ACT inhaler Inhale 2 puffs into the lungs 2 (two) times daily as needed for wheezing or shortness of breath.     [provider]  albuterol (PROVENTIL) (  2.5 MG/3ML) 0.083% nebulizer solution Take 2.5 mg by nebulization every 6 (six) hours as needed for wheezing or shortness of breath.    [provider]  cephALEXin (KEFLEX) 500 MG capsule Take 1 capsule (500 mg total) by mouth 4 (four) times daily. 05/19/18   Davonna Belling, MD  famotidine (PEPCID) 10 MG tablet Take 10 mg by mouth daily as needed for heartburn.     [provider]  glimepiride (AMARYL) 2 MG tablet Take 2 mg by mouth daily. 04/07/18   [provider]  JANUVIA 100 MG tablet Take 100 mg by mouth daily. 07/02/16   [provider]  lisinopril (PRINIVIL,ZESTRIL) 10 MG tablet Take 1 tablet (10 mg total) by mouth daily. 07/08/16   Croitoru, Mihai, MD  metoprolol succinate (TOPROL-XL) 50 MG 24 hr  tablet Take 50 mg by mouth daily. Take with or immediately following a meal.    [provider]  mometasone (NASONEX) 50 MCG/ACT nasal spray Place 2 sprays into the nose daily.     [provider]  ondansetron (ZOFRAN ODT) 8 MG disintegrating tablet Take 1 tablet (8 mg total) by mouth every 8 (eight) hours as needed for nausea or vomiting. 05/08/18   Pattricia Boss, MD  ondansetron (ZOFRAN) 4 MG tablet Take 1 tablet (4 mg total) by mouth every 6 (six) hours. Patient not taking: Reported on 04/27/2018 03/17/18   Nat Christen, MD  pravastatin (PRAVACHOL) 20 MG tablet Take 20 mg by mouth daily.    [provider]  triamterene-hydrochlorothiazide (MAXZIDE) 75-50 MG per tablet Take 1 tablet by mouth daily.    [provider]    Family History Family History  Problem Relation Age of Onset  . Diabetes Mother   . Hypertension Mother   . Diabetes Father   . Hypertension Father   . Heart attack Father 51  . Diabetes Maternal Aunt   . Diabetes Maternal Grandmother   . Hypertension Maternal Grandmother   . Cancer Maternal Grandfather   . Diabetes Maternal Grandfather   . Hypertension Maternal Grandfather   . Asthma Paternal Grandmother   . Diabetes Paternal Grandmother   . Hypertension Paternal Grandmother   . Diabetes Paternal Grandfather   . Hypertension Paternal Grandfather   . Heart failure Paternal Grandfather     Social History Social History   Tobacco Use  . Smoking status: Never Smoker  . Smokeless tobacco: Never Used  Substance Use Topics  . Alcohol use: No  . Drug use: No     Allergies   Bee venom; Iodine; Shellfish allergy; Actos [pioglitazone]; Aspirin; Latex; Metformin and related; Morphine and related; Nsaids; and Ciprofloxacin   Review of Systems Review of Systems  Constitutional: Negative for appetite change.  HENT: Negative for congestion.   Respiratory: Positive for cough and shortness of breath.   Cardiovascular: Positive for  chest pain and leg swelling.  Gastrointestinal: Negative for abdominal pain.  Genitourinary: Negative for flank pain.  Musculoskeletal: Negative for back pain.  Neurological: Negative for weakness.     Physical Exam Updated Vital Signs BP (!) 137/57 (BP Location: Right Arm)   Pulse 70   Temp 97.9 F (36.6 C) (Oral)   Resp 17   Ht 5\' 6"  (1.676 m)   Wt 120.2 kg   SpO2 98%   BMI 42.77 kg/m   Physical Exam Constitutional:      Appearance: She is well-developed.  Neck:     Musculoskeletal: Neck supple.  Cardiovascular:     Rate  and Rhythm: Normal rate and regular rhythm.     Heart sounds: No murmur.  Pulmonary:     Effort: No tachypnea.     Breath sounds: No wheezing, rhonchi or rales.  Abdominal:     Tenderness: There is no abdominal tenderness.  Musculoskeletal:     Comments: Edema bilateral lower legs, worse on left.  Skin:    Capillary Refill: Capillary refill takes less than 2 seconds.  Neurological:     General: No focal deficit present.     Mental Status: She is alert.      ED Treatments / Results  Labs (all labs ordered are listed, but only abnormal results are displayed) Labs Reviewed  BASIC METABOLIC PANEL - Abnormal; Notable for the following components:      Result Value   Glucose, Bld 262 (*)    All other components within normal limits  CBC WITH DIFFERENTIAL/PLATELET - Abnormal; Notable for the following components:   WBC 10.7 (*)    RBC 5.52 (*)    Hemoglobin 15.8 (*)    HCT 48.5 (*)    Neutro Abs 8.1 (*)    All other components within normal limits  URINALYSIS, ROUTINE W REFLEX MICROSCOPIC - Abnormal; Notable for the following components:   Glucose, UA >=500 (*)    Leukocytes, UA TRACE (*)    Bacteria, UA MANY (*)    All other components within normal limits  URINE CULTURE  TROPONIN I    EKG EKG Interpretation  Date/Time:  Wednesday May 19 2018 16:03:26 EST Ventricular Rate:  75 PR Interval:    QRS Duration: 130 QT  Interval:  397 QTC Calculation: 444 R Axis:   45 Text Interpretation:  Sinus rhythm Atrial premature complexes Right bundle branch block Minimal ST elevation, inferior leads Baseline wander in lead(s) V3 V4 V5 V6 Confirmed by Davonna Belling [phone]) on 05/19/2018 4:10:24 PM   Radiology Dg Chest 2 View  Result Date: 05/19/2018 CLINICAL DATA:  Central and left chest pain radiating posteriorly with shortness of breath since 1300 hours today. EXAM: CHEST - 2 VIEW COMPARISON:  05/08/2018. FINDINGS: Borderline enlarged cardiac silhouette with a mild interval decrease in size. No significant change in mild diffuse peribronchial thickening and accentuation of the interstitial markings. Thoracic spine degenerative changes. IMPRESSION: 1. No acute abnormality. 2. Stable mild chronic bronchitic changes and mild chronic interstitial lung disease. Electronically Signed   By: Claudie Revering M.D.   On: 05/19/2018 17:21    Procedures Procedures (including critical care time)  Medications Ordered in ED Medications - No data to display   Initial Impression / Assessment and Plan / ED Course  I have reviewed the triage vital signs and the nursing notes.  Pertinent labs & imaging results that were available during my care of the patient were reviewed by me and considered in my medical decision making (see chart for details).     Patient presents with chest pain.  Has had somewhat recent work-up.  Has cardiology follow-up.  EKG and x-ray reassuring.  Has had chills and infection seem more like than ischemia.  Urine shows possible infection patient states she has had urinary frequency.  Will treat with antibiotics.  Discharge home.  Final Clinical Impressions(s) / ED Diagnoses   Final diagnoses:  Precordial pain  Cystitis    ED Discharge Orders         Ordered    cephALEXin (KEFLEX) 500 MG capsule  4 times daily     05/19/18 1919  Davonna Belling, MD 05/19/18 [phone]

## 2018-05-19 NOTE — ED Triage Notes (Signed)
Pt arrives POV from home c/o chest pain and SHOB today. CP 6 out of 10. CP is on left side, radiates down left arm per pt. Pt denies n/v and sweating. Pt a&o x4, VSS at this time.

## 2018-05-19 NOTE — ED Notes (Signed)
Patient verbalizes understanding of medications and discharge instructions. No further questions at this time. VSS and patient ambulatory at discharge.   

## 2018-05-20 ENCOUNTER — Telehealth: Payer: Self-pay | Admitting: Cardiovascular Disease

## 2018-05-20 ENCOUNTER — Telehealth: Payer: Self-pay

## 2018-05-20 NOTE — Telephone Encounter (Signed)
New Message    Patient returning Amanda Butler's phone call about care guide visit.

## 2018-05-20 NOTE — Telephone Encounter (Signed)
Will forward to [facility]

## 2018-05-20 NOTE — Telephone Encounter (Signed)
Called to schedule care guide visit. No answer. Will call back

## 2018-05-21 ENCOUNTER — Telehealth: Payer: Self-pay

## 2018-05-21 LAB — URINE CULTURE: Culture: >=100000 — AB

## 2018-05-21 NOTE — Telephone Encounter (Signed)
New Message    Patient returning Amanda Butler's phone call.  Patient request to be called on [phone].

## 2018-05-21 NOTE — Telephone Encounter (Signed)
Called to return pt phone call. Left vm.

## 2018-05-22 ENCOUNTER — Telehealth: Payer: Self-pay | Admitting: Emergency Medicine

## 2018-05-22 NOTE — Telephone Encounter (Signed)
Post ED Visit - Positive Culture Follow-up  Culture report reviewed by antimicrobial stewardship pharmacist:  []  Elenor Quinones, Pharm.D. []  Heide Guile, Pharm.D., BCPS AQ-ID []  Parks Neptune, Pharm.D., BCPS []  Alycia Rossetti, Pharm.D., BCPS []  [place], [place].D., BCPS, AAHIVP []  Legrand Como, Pharm.D., BCPS, AAHIVP []  Salome Arnt, PharmD, BCPS []  Johnnette Gourd, PharmD, BCPS []  Hughes Better, PharmD, BCPS [x]  Albertina Parr, PharmD  Positive Urine culture Treated with Cephalexin, organism sensitive to the same and no further patient follow-up is required at this time.  Larene Beach O[place]achi Navarrette 05/22/2018, 3:18 PM

## 2018-06-01 ENCOUNTER — Telehealth: Payer: Self-pay

## 2018-06-01 NOTE — Telephone Encounter (Signed)
Returning pt phone call about care guide visit. Left message.

## 2018-07-04 ENCOUNTER — Emergency Department (HOSPITAL_COMMUNITY)
Admission: EM | Admit: 2018-07-04 | Discharge: 2018-07-04 | Disposition: A | Discharge status: Home / Self Care | Payer: BLUE CROSS/BLUE SHIELD | Attending: Emergency Medicine | Admitting: Emergency Medicine

## 2018-07-04 ENCOUNTER — Other Ambulatory Visit: Payer: Self-pay

## 2018-07-04 ENCOUNTER — Emergency Department (HOSPITAL_COMMUNITY): Payer: BLUE CROSS/BLUE SHIELD

## 2018-07-04 ENCOUNTER — Encounter (HOSPITAL_COMMUNITY): Payer: Self-pay | Admitting: *Deleted

## 2018-07-04 DIAGNOSIS — I509 Heart failure, unspecified: Secondary | ICD-10-CM | POA: Insufficient documentation

## 2018-07-04 DIAGNOSIS — R0789 Other chest pain: Secondary | ICD-10-CM | POA: Diagnosis not present

## 2018-07-04 DIAGNOSIS — R05 Cough: Secondary | ICD-10-CM | POA: Diagnosis not present

## 2018-07-04 DIAGNOSIS — R059 Cough, unspecified: Secondary | ICD-10-CM

## 2018-07-04 DIAGNOSIS — R600 Localized edema: Secondary | ICD-10-CM | POA: Insufficient documentation

## 2018-07-04 DIAGNOSIS — Z79899 Other long term (current) drug therapy: Secondary | ICD-10-CM | POA: Insufficient documentation

## 2018-07-04 DIAGNOSIS — E119 Type 2 diabetes mellitus without complications: Secondary | ICD-10-CM | POA: Insufficient documentation

## 2018-07-04 DIAGNOSIS — I10 Essential (primary) hypertension: Secondary | ICD-10-CM | POA: Diagnosis not present

## 2018-07-04 DIAGNOSIS — R0602 Shortness of breath: Secondary | ICD-10-CM | POA: Insufficient documentation

## 2018-07-04 DIAGNOSIS — Z91040 Latex allergy status: Secondary | ICD-10-CM | POA: Diagnosis not present

## 2018-07-04 MED ORDER — HYDROCOD POLST-CPM POLST ER 10-8 MG/5ML PO SUER
5.0000 mL | Freq: Once | ORAL | Status: AC
  Administered 2018-07-04: 5 mL via ORAL
  Filled 2018-07-04: qty 5

## 2018-07-04 MED ORDER — ALBUTEROL SULFATE (2.5 MG/3ML) 0.083% IN NEBU
5.0000 mg | INHALATION_SOLUTION | Freq: Once | RESPIRATORY_TRACT | Status: AC
  Administered 2018-07-04: 5 mg via RESPIRATORY_TRACT
  Filled 2018-07-04: qty 6

## 2018-07-04 NOTE — ED Notes (Signed)
ED Provider at bedside. 

## 2018-07-04 NOTE — ED Triage Notes (Signed)
The pt is here with a cough and chest congestion with chest soreness from coughing all day  Unknown temp     Coughing worse for the past 2 hours

## 2018-07-04 NOTE — Discharge Instructions (Signed)
Continue your current medications. You can use a saline nasal inhaler if you feel you continue to have phlegm in your throat. Keep your scheduled appointment with Dr. Marisue Humble this week.

## 2018-07-04 NOTE — ED Notes (Addendum)
Reviewed d/c instructions with pt, who verbalized understanding and had no outstanding questions. Armband & labels removed and placed in shred bin. Pt departed in NAD.   

## 2018-07-04 NOTE — ED Notes (Signed)
Patient transported to X-ray 

## 2018-07-04 NOTE — ED Provider Notes (Signed)
[facility] EMERGENCY DEPARTMENT Provider Note   CSN: [id number] Arrival date & time: 07/04/18  0225    History   Chief Complaint Chief Complaint  Patient presents with  . Cough    HPI Amanda Butler is a 63 y.o. female.     Patient with history of asthma, dCHF, GERD, HLD, HTN, DM presents with concern for uncontrolled cough that woke her from sleep tonight. No fever, significant congestion, nausea. No LE edema greater than her usual. She reports chest pain in center of her chest when she coughs, but no chest pain at any other time tonight. She reports using her nebulizer as per regular schedule today but did not use it tonight during the cough episode. Cough has improved since leaving her home to come to the ED.  The history is provided by the patient. No language interpreter was used.  Cough  Associated symptoms: chest pain (Chest pain with cough only) and shortness of breath ("Couldn't catch my breath because of coughing")   Associated symptoms: no chills, no diaphoresis, no fever, no myalgias and no sore throat     Past Medical History:  Diagnosis Date  . Arthritis    "knees" (09/13/2015)  . Asthma   . Cellulitis   . Chronic diastolic CHF (congestive heart failure) ([place])    a. 09/2015 Echo: EF 55-60%, mild LVH, gr2DD, no rwma, mild MR, mildly dil LA.   Marland Kitchen GERD (gastroesophageal reflux disease)   . High cholesterol   . Hypertension   . Midsternal chest pain    a. 09/2015 MV: no ischemia/infarct, EF 61%; b. 09/2015 Low prob V:Q scan.  . Morbid obesity ([place])   . Obesity   . Pneumonia ~ 2000   "mild case"  . RBBB (right bundle branch block) approx 2010   Dr Marisue Humble did ECG and told her  . Type II diabetes mellitus [facility])     Patient Active Problem List   Diagnosis Date Noted  . Pressure injury of skin 02/20/2018  . Cellulitis 10/02/2017  . 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  . Closed nondisplaced fracture of  greater tuberosity of left humerus 04/23/2017  . Type II diabetes mellitus ([place])   . High cholesterol   . Morbid obesity ([place])   . Chronic diastolic CHF (congestive heart failure) ([place])   . Midsternal chest pain   . Pain in the chest   . Essential hypertension   . Morbid obesity due to excess calories ([place])   . Chest pain 09/13/2015  . Bilateral lower extremity edema 09/13/2015  . Diabetes mellitus type 2, diet-controlled ([place]) 09/13/2015  . Obesity 09/13/2015  . Total bilirubin, elevated 09/13/2015  . HTN (hypertension) 09/13/2015  . Stasis dermatitis of both legs 09/13/2015  . Left leg cellulitis 09/13/2015  . RBBB 09/13/2015  . Asthma 09/13/2015  . GERD (gastroesophageal reflux disease) 09/13/2015  . 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    Past Surgical History:  Procedure Laterality Date  . BUNIONECTOMY Left   . COLONOSCOPY WITH PROPOFOL N/A 09/23/2016   Procedure: COLONOSCOPY WITH PROPOFOL;  Surgeon: Garlan Fair, MD;  Location: [facility] ENDOSCOPY;  Service: Endoscopy;  Laterality: N/A;  . MULTIPLE TOOTH EXTRACTIONS  ~ 1975  . TENDON RELEASE Right    Leg  . TUBAL LIGATION  ~ 1983     OB History   No obstetric history on file.      Home Medications    Prior to Admission medications   Medication  Sig Start Date End Date Taking? Authorizing Provider  acetaminophen (TYLENOL) 500 MG tablet Take 1,000 mg by mouth every 6 (six) hours as needed for mild pain or fever.    Yes [provider]  albuterol (PROVENTIL HFA;VENTOLIN HFA) 108 (90 BASE) MCG/ACT inhaler Inhale 2 puffs into the lungs 2 (two) times daily as needed for wheezing or shortness of breath.    Yes [provider]  albuterol (PROVENTIL) (2.5 MG/3ML) 0.083% nebulizer solution Take 2.5 mg by nebulization every 6 (six) hours as needed for wheezing or shortness of breath.   Yes [provider]  famotidine (PEPCID) 10 MG tablet Take 10 mg by mouth daily as needed for heartburn.    Yes [provider]  glimepiride (AMARYL) 2 MG tablet Take 2 mg by mouth daily. 04/07/18  Yes [provider]  JANUVIA 100 MG tablet Take 100 mg by mouth daily. 07/02/16  Yes [provider]  lisinopril (PRINIVIL,ZESTRIL) 10 MG tablet Take 1 tablet (10 mg total) by mouth daily. 07/08/16  Yes Croitoru, Mihai, MD  metoprolol succinate (TOPROL-XL) 50 MG 24 hr tablet Take 50 mg by mouth daily. Take with or immediately following a meal.   Yes [provider]  mometasone (NASONEX) 50 MCG/ACT nasal spray Place 2 sprays into the nose daily.    Yes [provider]  pravastatin (PRAVACHOL) 20 MG tablet Take 20 mg by mouth daily.   Yes [provider]  triamterene-hydrochlorothiazide (MAXZIDE) 75-50 MG per tablet Take 1 tablet by mouth daily.   Yes [provider]  cephALEXin (KEFLEX) 500 MG capsule Take 1 capsule (500 mg total) by mouth 4 (four) times daily. Patient not taking: Reported on 07/04/2018 05/19/18   Davonna Belling, MD  ondansetron (ZOFRAN ODT) 8 MG disintegrating tablet Take 1 tablet (8 mg total) by mouth every 8 (eight) hours as needed for nausea or vomiting. Patient not taking: Reported on 07/04/2018 05/08/18   Pattricia Boss, MD  ondansetron (ZOFRAN) 4 MG tablet Take 1 tablet (4 mg total) by mouth every 6 (six) hours. Patient not taking: Reported on 04/27/2018 03/17/18   Nat Christen, MD    Family History Family History  Problem Relation Age of Onset  . Diabetes Mother   . Hypertension Mother   . Diabetes Father   . Hypertension Father   . Heart attack Father 62  . Diabetes Maternal Aunt   . Diabetes Maternal Grandmother   . Hypertension Maternal Grandmother   . Cancer Maternal Grandfather   . Diabetes Maternal Grandfather   . Hypertension Maternal Grandfather   . Asthma Paternal Grandmother   . Diabetes Paternal Grandmother   . Hypertension Paternal Grandmother   . Diabetes Paternal Grandfather   . Hypertension Paternal Grandfather   .  Heart failure Paternal Grandfather     Social History Social History   Tobacco Use  . Smoking status: Never Smoker  . Smokeless tobacco: Never Used  Substance Use Topics  . Alcohol use: No  . Drug use: No     Allergies   Bee venom; Iodine; Shellfish allergy; Actos [pioglitazone]; Aspirin; Latex; Metformin and related; Morphine and related; Nsaids; and Ciprofloxacin   Review of Systems Review of Systems  Constitutional: Negative for chills, diaphoresis and fever.  HENT: Negative.  Negative for congestion and sore throat.   Respiratory: Positive for cough and shortness of breath ("Couldn't catch my breath because of coughing").   Cardiovascular: Positive for chest pain (Chest pain with cough only).  Gastrointestinal: Negative.  Negative for abdominal pain, nausea and vomiting.  Musculoskeletal: Negative.  Negative for myalgias.  Skin: Negative.   Neurological: Negative.      Physical Exam Updated Vital Signs BP (!) 145/62   Pulse 80   Temp 98.1 F (36.7 C) (Oral)   Resp 17   Ht 5\' 6"  (1.676 m)   Wt 122.5 kg   SpO2 100%   BMI 43.58 kg/m   Physical Exam Vitals signs and nursing note reviewed.  Constitutional:      Appearance: She is well-developed.  HENT:     Head: Normocephalic.     Nose: No congestion.     Mouth/Throat:     Mouth: Mucous membranes are moist.  Eyes:     Conjunctiva/sclera: Conjunctivae normal.  Neck:     Musculoskeletal: Normal range of motion and neck supple.  Cardiovascular:     Rate and Rhythm: Normal rate and regular rhythm.     Heart sounds: No murmur.  Pulmonary:     Effort: Pulmonary effort is normal.     Breath sounds: Normal breath sounds. No wheezing, rhonchi or rales.     Comments: Patient is not coughing during exam.  Chest:     Chest wall: No tenderness.  Abdominal:     General: Bowel sounds are normal.     Palpations: Abdomen is soft.     Tenderness: There is no abdominal tenderness. There is no guarding or rebound.   Musculoskeletal: Normal range of motion.     Right lower leg: Edema present.     Left lower leg: Edema present.  Skin:    General: Skin is warm and dry.     Findings: No rash.  Neurological:     Mental Status: She is alert and oriented to person, place, and time.      ED Treatments / Results  Labs (all labs ordered are listed, but only abnormal results are displayed) Labs Reviewed - No data to display  EKG EKG Interpretation  Date/Time:  Sunday July 04 2018 02:36:29 EDT Ventricular Rate:  84 PR Interval:    QRS Duration: 133 QT Interval:  402 QTC Calculation: 476 R Axis:   28 Text Interpretation:  Sinus rhythm Right bundle branch block Baseline wander in lead(s) V5 V6 Confirmed by Veryl Speak [phone]) on 07/04/2018 3:57:11 AM   Radiology Dg Chest 2 View  Result Date: 07/04/2018 CLINICAL DATA:  Cough and shortness of breath. EXAM: CHEST - 2 VIEW COMPARISON:  05/19/2018 FINDINGS: Cardiomediastinal silhouette is normal. Mediastinal contours appear intact. Calcific atherosclerotic disease of the aorta. There is no evidence of focal airspace consolidation, pleural effusion or pneumothorax. Chronic bronchitic changes. Osseous structures are without acute abnormality. Soft tissues are grossly normal. IMPRESSION: 1. Chronic bronchitic changes. 2. No evidence of lobar consolidation. Electronically Signed   By: Fidela Salisbury M.D.   On: 07/04/2018 03:58    Procedures Procedures (including critical care time)  Medications Ordered in ED Medications  albuterol (PROVENTIL) (2.5 MG/3ML) 0.083% nebulizer solution 5 mg (5 mg Nebulization Given 07/04/18 0317)  chlorpheniramine-HYDROcodone (TUSSIONEX) 10-8 MG/5ML suspension 5 mL (5 mLs Oral Given 07/04/18 0316)     Initial Impression / Assessment and Plan / ED Course  I have reviewed the triage vital signs and the nursing notes.  Pertinent labs & imaging results that were available during my care of the patient were reviewed by me  and considered in my medical decision making (see chart for details).        Patient  to ED with complaint of cough that woke her from sleep tonight. She has chest pain that is only with cough, and no chest pain without cough. No fever.   The patient is considered low risk for exposure to COVID with no known sick contacts, no fever, no travel.   CXR is negative for infiltrates or edema. The patient has had no coughing in the ED. She is given a nebulizer treatment and Tussionex to address symptomatic relief.   Feel she is stable for discharge home. She is encouraged to keep her already scheduled appointment with her doctor in the coming week.   Final Clinical Impressions(s) / ED Diagnoses   Final diagnoses:  None   1. Cough  ED Discharge Orders    None       Charlann Lange, PA-C 07/04/18 Cresenciano Lick, MD 07/04/18 [phone]

## 2018-08-21 ENCOUNTER — Other Ambulatory Visit: Payer: Self-pay

## 2018-08-21 ENCOUNTER — Emergency Department (HOSPITAL_COMMUNITY)
Admission: EM | Admit: 2018-08-21 | Discharge: 2018-08-21 | Disposition: A | Discharge status: Home / Self Care | Payer: Medicare Other | Attending: Emergency Medicine | Admitting: Emergency Medicine

## 2018-08-21 ENCOUNTER — Encounter (HOSPITAL_COMMUNITY): Payer: Self-pay | Admitting: Emergency Medicine

## 2018-08-21 ENCOUNTER — Emergency Department (HOSPITAL_COMMUNITY): Payer: Medicare Other

## 2018-08-21 DIAGNOSIS — R42 Dizziness and giddiness: Secondary | ICD-10-CM | POA: Diagnosis present

## 2018-08-21 DIAGNOSIS — I110 Hypertensive heart disease with heart failure: Secondary | ICD-10-CM | POA: Diagnosis not present

## 2018-08-21 DIAGNOSIS — E1165 Type 2 diabetes mellitus with hyperglycemia: Secondary | ICD-10-CM | POA: Diagnosis not present

## 2018-08-21 DIAGNOSIS — R739 Hyperglycemia, unspecified: Secondary | ICD-10-CM

## 2018-08-21 DIAGNOSIS — Z1159 Encounter for screening for other viral diseases: Secondary | ICD-10-CM | POA: Diagnosis not present

## 2018-08-21 DIAGNOSIS — I5032 Chronic diastolic (congestive) heart failure: Secondary | ICD-10-CM | POA: Diagnosis not present

## 2018-08-21 DIAGNOSIS — Z79899 Other long term (current) drug therapy: Secondary | ICD-10-CM | POA: Insufficient documentation

## 2018-08-21 DIAGNOSIS — H539 Unspecified visual disturbance: Secondary | ICD-10-CM | POA: Insufficient documentation

## 2018-08-21 DIAGNOSIS — Z91040 Latex allergy status: Secondary | ICD-10-CM | POA: Insufficient documentation

## 2018-08-21 DIAGNOSIS — Z7984 Long term (current) use of oral hypoglycemic drugs: Secondary | ICD-10-CM | POA: Diagnosis not present

## 2018-08-21 LAB — COMPREHENSIVE METABOLIC PANEL
ALT: 20 U/L (ref 0–44)
AST: 17 U/L (ref 15–41)
Albumin: 3.7 g/dL (ref 3.5–5.0)
Alkaline Phosphatase: 82 U/L (ref 38–126)
Anion gap: 14 (ref 5–15)
BUN: 31 mg/dL — ABNORMAL HIGH (ref 8–23)
CO2: 24 mmol/L (ref 22–32)
Calcium: 9.5 mg/dL (ref 8.9–10.3)
Chloride: 96 mmol/L — ABNORMAL LOW (ref 98–111)
Creatinine, Ser: 1.11 mg/dL — ABNORMAL HIGH (ref 0.44–1.00)
GFR calc Af Amer: >60 mL/min (ref >60)
GFR calc non Af Amer: 52 mL/min — ABNORMAL LOW (ref >60)
Glucose, Bld: 268 mg/dL — ABNORMAL HIGH (ref 70–99)
Potassium: 4.8 mmol/L (ref 3.5–5.1)
Sodium: 134 mmol/L — ABNORMAL LOW (ref 135–145)
Total Bilirubin: 0.8 mg/dL (ref 0.3–1.2)
Total Protein: 8 g/dL (ref 6.5–8.1)

## 2018-08-21 LAB — CBG MONITORING, ED: Glucose-Capillary: 273 mg/dL — ABNORMAL HIGH (ref 70–99)

## 2018-08-21 LAB — SARS CORONAVIRUS 2 BY RT PCR (HOSPITAL ORDER, PERFORMED IN ~~LOC~~ HOSPITAL LAB): SARS Coronavirus 2: NEGATIVE

## 2018-08-21 LAB — CBC
HCT: 47 % — ABNORMAL HIGH (ref 36.0–46.0)
Hemoglobin: 15.9 g/dL — ABNORMAL HIGH (ref 12.0–15.0)
MCH: 29.1 pg (ref 26.0–34.0)
MCHC: 33.8 g/dL (ref 30.0–36.0)
MCV: 85.9 fL (ref 80.0–100.0)
Platelets: 302 10*3/uL (ref 150–400)
RBC: 5.47 MIL/uL — ABNORMAL HIGH (ref 3.87–5.11)
RDW: 12.9 % (ref 11.5–15.5)
WBC: 10.2 10*3/uL (ref 4.0–10.5)
nRBC: 0 % (ref 0.0–0.2)

## 2018-08-21 NOTE — ED Provider Notes (Signed)
[place] EMERGENCY DEPARTMENT Provider Note   CSN: [id number] Arrival date & time: 08/21/18  1530    History   Chief Complaint Chief Complaint  Patient presents with  . Dizziness  . vision changes    HPI Amanda Butler is a 63 y.o. female.     HPI  63 yo female complaining of lightheadedness with visual dots right greater than left.  Symptoms began one hour pt transport.  She was sitting on the couch.  She has had similar symptoms only once before when her grandfather died.  She felt like the symptoms got better with putting her head down or standing  Up.  She denies headache or head injury.  She recently started a new dm medicine but states she was unable to check her sugar due to broken machine.  She did not otherwise fell hypoglycemic.  She denies fever, chills, nausea, vomiting, productive cough, or diarrhea.  Past Medical History:  Diagnosis Date  . Arthritis    "knees" (09/13/2015)  . Asthma   . Cellulitis   . Chronic diastolic CHF (congestive heart failure) ([place])    a. 09/2015 Echo: EF 55-60%, mild LVH, gr2DD, no rwma, mild MR, mildly dil LA.   Marland Kitchen GERD (gastroesophageal reflux disease)   . High cholesterol   . Hypertension   . Midsternal chest pain    a. 09/2015 MV: no ischemia/infarct, EF 61%; b. 09/2015 Low prob V:Q scan.  . Morbid obesity ([place])   . Obesity   . Pneumonia ~ 2000   "mild case"  . RBBB (right bundle branch block) approx 2010   Dr Marisue Humble did ECG and told her  . Type II diabetes mellitus [facility])     Patient Active Problem List   Diagnosis Date Noted  . Pressure injury of skin 02/20/2018  . Cellulitis 10/02/2017  . 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  . Closed nondisplaced fracture of greater tuberosity of left humerus 04/23/2017  . Type II diabetes mellitus ([place])   . High cholesterol   . Morbid obesity ([place])   . Chronic diastolic CHF (congestive heart failure) ([place])   . Midsternal chest pain    . Pain in the chest   . Essential hypertension   . Morbid obesity due to excess calories ([place])   . Chest pain 09/13/2015  . Bilateral lower extremity edema 09/13/2015  . Diabetes mellitus type 2, diet-controlled ([place]) 09/13/2015  . Obesity 09/13/2015  . Total bilirubin, elevated 09/13/2015  . HTN (hypertension) 09/13/2015  . Stasis dermatitis of both legs 09/13/2015  . Left leg cellulitis 09/13/2015  . RBBB 09/13/2015  . Asthma 09/13/2015  . GERD (gastroesophageal reflux disease) 09/13/2015  . 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    Past Surgical History:  Procedure Laterality Date  . BUNIONECTOMY Left   . COLONOSCOPY WITH PROPOFOL N/A 09/23/2016   Procedure: COLONOSCOPY WITH PROPOFOL;  Surgeon: Garlan Fair, MD;  Location: [facility];  Service: Endoscopy;  Laterality: N/A;  . MULTIPLE TOOTH EXTRACTIONS  ~ 1975  . TENDON RELEASE Right    Leg  . TUBAL LIGATION  ~ 1983     OB History   No obstetric history on file.      Home Medications    Prior to Admission medications   Medication Sig Start Date End Date Taking? Authorizing Provider  acetaminophen (TYLENOL) 500 MG tablet Take 1,000 mg by mouth every 6 (six) hours as needed for mild pain or fever.  [provider]  albuterol (PROVENTIL HFA;VENTOLIN HFA) 108 (90 BASE) MCG/ACT inhaler Inhale 2 puffs into the lungs 2 (two) times daily as needed for wheezing or shortness of breath.     [provider]  albuterol (PROVENTIL) (2.5 MG/3ML) 0.083% nebulizer solution Take 2.5 mg by nebulization every 6 (six) hours as needed for wheezing or shortness of breath.    [provider]  cephALEXin (KEFLEX) 500 MG capsule Take 1 capsule (500 mg total) by mouth 4 (four) times daily. Patient not taking: Reported on 07/04/2018 05/19/18   Davonna Belling, MD  famotidine (PEPCID) 10 MG tablet Take 10 mg by mouth daily as needed for heartburn.     [provider]  glimepiride (AMARYL) 2 MG tablet Take 2 mg  by mouth daily. 04/07/18   [provider]  JANUVIA 100 MG tablet Take 100 mg by mouth daily. 07/02/16   [provider]  lisinopril (PRINIVIL,ZESTRIL) 10 MG tablet Take 1 tablet (10 mg total) by mouth daily. 07/08/16   Croitoru, Mihai, MD  metoprolol succinate (TOPROL-XL) 50 MG 24 hr tablet Take 50 mg by mouth daily. Take with or immediately following a meal.    [provider]  mometasone (NASONEX) 50 MCG/ACT nasal spray Place 2 sprays into the nose daily.     [provider]  ondansetron (ZOFRAN ODT) 8 MG disintegrating tablet Take 1 tablet (8 mg total) by mouth every 8 (eight) hours as needed for nausea or vomiting. Patient not taking: Reported on 07/04/2018 05/08/18   Pattricia Boss, MD  ondansetron (ZOFRAN) 4 MG tablet Take 1 tablet (4 mg total) by mouth every 6 (six) hours. Patient not taking: Reported on 04/27/2018 03/17/18   Nat Christen, MD  pravastatin (PRAVACHOL) 20 MG tablet Take 20 mg by mouth daily.    [provider]  triamterene-hydrochlorothiazide (MAXZIDE) 75-50 MG per tablet Take 1 tablet by mouth daily.    [provider]    Family History Family History  Problem Relation Age of Onset  . Diabetes Mother   . Hypertension Mother   . Diabetes Father   . Hypertension Father   . Heart attack Father 39  . Diabetes Maternal Aunt   . Diabetes Maternal Grandmother   . Hypertension Maternal Grandmother   . Cancer Maternal Grandfather   . Diabetes Maternal Grandfather   . Hypertension Maternal Grandfather   . Asthma Paternal Grandmother   . Diabetes Paternal Grandmother   . Hypertension Paternal Grandmother   . Diabetes Paternal Grandfather   . Hypertension Paternal Grandfather   . Heart failure Paternal Grandfather     Social History Social History   Tobacco Use  . Smoking status: Never Smoker  . Smokeless tobacco: Never Used  Substance Use Topics  . Alcohol use: No  . Drug use: No     Allergies   Bee venom;  Iodine; Shellfish allergy; Actos [pioglitazone]; Aspirin; Latex; Metformin and related; Morphine and related; Nsaids; and Ciprofloxacin   Review of Systems Review of Systems  All other systems reviewed and are negative.    Physical Exam Updated Vital Signs BP (!) 143/57 (BP Location: Right Arm)   Pulse 73   Temp 98.8 F (37.1 C) (Oral)   Resp 20   Ht 1.676 m (5\' 6" )   Wt 117.9 kg   SpO2 96%   BMI 41.97 kg/m   Physical Exam Nursing note reviewed. Exam conducted with a chaperone present.  Constitutional:      General: She is not  in acute distress.    Appearance: Normal appearance. She is obese. She is not ill-appearing or diaphoretic.  HENT:     Head: Normocephalic and atraumatic.     Right Ear: External ear normal.     Left Ear: External ear normal.     Mouth/Throat:     Mouth: Mucous membranes are moist.  Eyes:     General:        Right eye: No discharge.        Left eye: No discharge.     Extraocular Movements: Extraocular movements intact.     Conjunctiva/sclera: Conjunctivae normal.     Pupils: Pupils are equal, round, and reactive to light.  Neck:     Musculoskeletal: Normal range of motion and neck supple.  Cardiovascular:     Rate and Rhythm: Normal rate. Rhythm irregular.  Pulmonary:     Effort: Pulmonary effort is normal.     Breath sounds: Normal breath sounds.  Abdominal:     General: Abdomen is flat. Bowel sounds are normal.     Palpations: Abdomen is soft.  Musculoskeletal: Normal range of motion.        General: Swelling present. No tenderness.  Skin:    General: Skin is warm and dry.     Capillary Refill: Capillary refill takes less than 2 seconds.  Neurological:     General: No focal deficit present.     Mental Status: She is alert. Mental status is at baseline. She is disoriented.     Cranial Nerves: No cranial nerve deficit.     Sensory: No sensory deficit.     Motor: No weakness.     Coordination: Coordination normal.     Comments: No  visual field deficit noted on exam  Psychiatric:        Mood and Affect: Mood normal.        Behavior: Behavior normal.      ED Treatments / Results  Labs (all labs ordered are listed, but only abnormal results are displayed) Labs Reviewed  CBG MONITORING, ED - Abnormal; Notable for the following components:      Result Value   Glucose-Capillary 273 (*)    All other components within normal limits  CBC  COMPREHENSIVE METABOLIC PANEL  URINALYSIS, ROUTINE W REFLEX MICROSCOPIC    EKG EKG Interpretation  Date/Time:  Saturday Aug 21 2018 15:51:40 EDT Ventricular Rate:  64 PR Interval:    QRS Duration: 134 QT Interval:  410 QTC Calculation: 423 R Axis:   32 Text Interpretation:  Sinus rhythm Right bundle branch block Left ventricular hypertrophy No significant change since last tracing Confirmed by Pattricia Boss [phone]) on 08/21/2018 5:01:48 PM   Radiology Ct Head Wo Contrast  Result Date: 08/21/2018 CLINICAL DATA:  Visual deficit EXAM: CT HEAD WITHOUT CONTRAST TECHNIQUE: Contiguous axial images were obtained from the base of the skull through the vertex without intravenous contrast. COMPARISON:  12/11/2004 FINDINGS: Brain: No evidence of acute infarction, hemorrhage, hydrocephalus, extra-axial collection or mass lesion/mass effect. Vascular: No hyperdense vessel or unexpected calcification. Skull: Normal. Negative for fracture or focal lesion. Sinuses/Orbits: No acute finding. Other: None. IMPRESSION: Normal head CT Electronically Signed   By: Inez Catalina M.D.   On: 08/21/2018 17:00    Procedures Procedures (including critical care time)  Medications Ordered in ED Medications - No data to display   Initial Impression / Assessment and Plan / ED Course  I have reviewed the triage vital signs and the nursing notes.  Pertinent labs & imaging results that were available during my care of the patient were reviewed by me and considered in my medical decision making (see chart for  details).      DDX BRVO [place] Retinal detachment Vitreous hemorrhage  Discussed with Dr. Kathlen Mody on for ophthalmology and will see patient in ED  Dr. Kathlen Mody saw and evaluated her vision.  He feels patient is stable for discharge for ophthalmologic perspective.  Please see full consult Patient otherwise stable Will discharge to home Discussed results, return precautions and need for follow up    Final Clinical Impressions(s) / ED Diagnoses   Final diagnoses:  Visual changes  Hyperglycemia    ED Discharge Orders    None       Pattricia Boss, MD 08/21/18 [phone]

## 2018-08-21 NOTE — ED Notes (Signed)
Patient verbalizes understanding of discharge instructions. Opportunity for questioning and answers were provided. Armband removed by staff, pt discharged from ED.  

## 2018-08-21 NOTE — Discharge Instructions (Addendum)
Your eye exam here was normal CT of head showed no acute abnormalities your blood sugar is high at 268 Return if you have worsening symptoms especially weakness swelling from the body or other Follow-up with your doctor next week

## 2018-08-21 NOTE — ED Notes (Signed)
States she is seeing spots mostly in right eye, pain in neck and shoulders -- more than normal.  Swelling in left lower leg, reddened, will use doppler for pedal pulse-- recently on amoxicillan for same. Started new diabetes med yesterday - thinks it may have caused vision changes.   (repaglinide)

## 2018-08-21 NOTE — ED Triage Notes (Signed)
Has started on new med for diabetes and has started seeing spots and feeling dizzy about an hour ago, pt is alert, oriented x 4- w/d

## 2018-08-21 NOTE — Consult Note (Signed)
CC:  Chief Complaint  Patient presents with  . Dizziness  . vision changes    HPI: Amanda Butler is a 63 y.o. female w/ POH of RE and PMH below who presents for evaluation of dizziness and spots in vision in the right eye. Symptoms started today, sudden onset, no pain. Patient provides confusing history. Apparently vision is back to normal now, unclear if she's noticing floaters in the right eye. No flashes of light, no curtain/shade/veil coming over vision.  Recently switched to [facility] (from [facility]). Patient wondering if this is related.  CT Head was obtained and showed no acute intracranial abnormality.   Her ophthalmologist is Dr. Gevena Cotton.   ROS: Denies fever/chills, unintentional weight loss, chest pain, irregular heart rhythm, SOB, cough, wheezing, abdominal pain, melena, hematochezia, weakness, numbness, slurring of speech, facial droop, muscle weakness, joint pain, skin rash, tattoos, depressed mood  PMH: Past Medical History:  Diagnosis Date  . Arthritis    "knees" (09/13/2015)  . Asthma   . Cellulitis   . Chronic diastolic CHF (congestive heart failure) ([place])    a. 09/2015 Echo: EF 55-60%, mild LVH, gr2DD, no rwma, mild MR, mildly dil LA.   Marland Kitchen GERD (gastroesophageal reflux disease)   . High cholesterol   . Hypertension   . Midsternal chest pain    a. 09/2015 MV: no ischemia/infarct, EF 61%; b. 09/2015 Low prob V:Q scan.  . Morbid obesity ([place])   . Obesity   . Pneumonia ~ 2000   "mild case"  . RBBB (right bundle branch block) approx 2010   Dr Marisue Humble did ECG and told her  . Type II diabetes mellitus (HCC)     PSH: Past Surgical History:  Procedure Laterality Date  . BUNIONECTOMY Left   . COLONOSCOPY WITH PROPOFOL N/A 09/23/2016   Procedure: COLONOSCOPY WITH PROPOFOL;  Surgeon: Garlan Fair, MD;  Location: [facility];  Service: Endoscopy;  Laterality: N/A;  . MULTIPLE TOOTH EXTRACTIONS  ~ 1975  . TENDON RELEASE Right    Leg  . TUBAL LIGATION   ~ 1983    Meds: No current facility-administered medications on file prior to encounter.    Current Outpatient Medications on File Prior to Encounter  Medication Sig Dispense Refill  . acetaminophen (TYLENOL) 500 MG tablet Take 1,000 mg by mouth every 6 (six) hours as needed for mild pain or fever.     Marland Kitchen albuterol (PROVENTIL HFA;VENTOLIN HFA) 108 (90 BASE) MCG/ACT inhaler Inhale 2 puffs into the lungs 2 (two) times daily as needed for wheezing or shortness of breath.     Marland Kitchen albuterol (PROVENTIL) (2.5 MG/3ML) 0.083% nebulizer solution Take 2.5 mg by nebulization every 6 (six) hours as needed for wheezing or shortness of breath.    . cephALEXin (KEFLEX) 500 MG capsule Take 1 capsule (500 mg total) by mouth 4 (four) times daily. (Patient not taking: Reported on 07/04/2018) 28 capsule 0  . famotidine (PEPCID) 10 MG tablet Take 10 mg by mouth daily as needed for heartburn.     Marland Kitchen glimepiride (AMARYL) 2 MG tablet Take 2 mg by mouth daily.    Marland Kitchen JANUVIA 100 MG tablet Take 100 mg by mouth daily.  12  . lisinopril (PRINIVIL,ZESTRIL) 10 MG tablet Take 1 tablet (10 mg total) by mouth daily. 90 tablet 1  . metoprolol succinate (TOPROL-XL) 50 MG 24 hr tablet Take 50 mg by mouth daily. Take with or immediately following a meal.    . mometasone (NASONEX) 50 MCG/ACT nasal spray  Place 2 sprays into the nose daily.     . ondansetron (ZOFRAN ODT) 8 MG disintegrating tablet Take 1 tablet (8 mg total) by mouth every 8 (eight) hours as needed for nausea or vomiting. (Patient not taking: Reported on 07/04/2018) 20 tablet 0  . ondansetron (ZOFRAN) 4 MG tablet Take 1 tablet (4 mg total) by mouth every 6 (six) hours. (Patient not taking: Reported on 04/27/2018) 12 tablet 0  . pravastatin (PRAVACHOL) 20 MG tablet Take 20 mg by mouth daily.    Marland Kitchen triamterene-hydrochlorothiazide (MAXZIDE) 75-50 MG per tablet Take 1 tablet by mouth daily.      SH: Social History   Socioeconomic History  . Marital status: Widowed    Spouse  name: Not on file  . Number of children: Not on file  . Years of education: Not on file  . Highest education level: Not on file  Occupational History  . 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 Financial resource strain: Not on file  . Food insecurity:    Worry: Not on file    Inability: Not on file  . Transportation needs:    Medical: Not on file    Non-medical: Not on file  Tobacco Use  . Smoking status: Never Smoker  . Smokeless tobacco: Never Used  Substance and Sexual Activity  . Alcohol use: No  . Drug use: No  . Sexual activity: Never  Lifestyle  . Physical activity:    Days per week: Not on file    Minutes per session: Not on file  . Stress: Not on file  Relationships  . Social connections:    Talks on phone: Not on file    Gets together: Not on file    Attends religious service: Not on file    Active member of club or organization: Not on file    Attends meetings of clubs or organizations: Not on file    Relationship status: Not on file  Other Topics Concern  . Not on file  Social History Narrative   Pt lives with disabled daughter, is her primary caregiver.    FH: Family History  Problem Relation Age of Onset  . Diabetes Mother   . Hypertension Mother   . Diabetes Father   . Hypertension Father   . Heart attack Father 77  . Diabetes Maternal Aunt   . Diabetes Maternal Grandmother   . Hypertension Maternal Grandmother   . Cancer Maternal Grandfather   . Diabetes Maternal Grandfather   . Hypertension Maternal Grandfather   . Asthma Paternal Grandmother   . Diabetes Paternal Grandmother   . Hypertension Paternal Grandmother   . Diabetes Paternal Grandfather   . Hypertension Paternal Grandfather   . Heart failure Paternal Grandfather     Exam:  Lucianne Lei: OD: 5pt with correction OS: 5pt with correction   CVF: OD: full OS: full  EOM: OD: full d/v OS: full d/v  Pupils: OD: 3.5->2 mm, no APD OS: 3.5->2 mm, no  APD  IOP: by [place] OD: 24 OS: 24  External: OD: no periorbital edema, no proptosis, V1-V3 intact and symmetric, good orbicularis strength OS: no periorbital edema, no proptosis, V1-V3 intact and symmetric, good orbicularis strength  PF: 8/8  Pen Light Exam: L/L: OD: WNL OS: WNL  C/S: OD: white and quiet OS: white and quiet  K: OD: clear, no abnormal staining OS: clear, no abnormal staining  A/C: OD: grossly deep and quiet appearing by pen light OS:  grossly deep and quiet appearing by pen light  I: OD: round and regular OS: round and regular  L: OD: NSC OS: NSC  DFE: dilated @ 6:05 PM w/ Tropic and Phenyl OU  V: OD: clear OS: clear  N: OD: C/D 0.45, no disc edema, no heme OS: C/D 0.45, no disc edema, no heme  M: OD: flat, no obvious macular pathology; no exudates OS: flat, no obvious macular pathology; no exudates  V: OD: normal appearing vessels, no hemorrhage, no embolus OS: normal appearing vessels, no hemorrhage, no embolus  P: OD: retina flat 360, no obvious mass/RT/RD - few peripheral MAs OS: retina flat 360, no obvious mass/RT/RD - few peripheral MAs  A/P:  1. Subjective Visual Disturbance, right eye - Likely blood sugar fluctuation; monitor  - No sign of CRAO/BRAO and no evidence of BRVO/CRVO - No vitreous hemorrhage - Denies headache, jaw claudication, scalp tenderness - States vision has been improving since subjective visual disturbance  2. Diabetes Mellitus, Type II - Followed by Dr. Gevena Cotton - Mild peripheral NPDR OU - monitor  3. 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 Frederico Hamman in as scheduled (overdue for examination)  Marshall Cork, MD,MPH Ophthalmology

## 2018-09-14 ENCOUNTER — Other Ambulatory Visit: Payer: Self-pay

## 2018-09-14 ENCOUNTER — Encounter (HOSPITAL_BASED_OUTPATIENT_CLINIC_OR_DEPARTMENT_OTHER): Payer: Medicare Other | Attending: Internal Medicine

## 2018-09-14 DIAGNOSIS — E1140 Type 2 diabetes mellitus with diabetic neuropathy, unspecified: Secondary | ICD-10-CM | POA: Diagnosis not present

## 2018-09-14 DIAGNOSIS — I110 Hypertensive heart disease with heart failure: Secondary | ICD-10-CM | POA: Insufficient documentation

## 2018-09-14 DIAGNOSIS — Z6841 Body Mass Index (BMI) 40.0 and over, adult: Secondary | ICD-10-CM | POA: Diagnosis not present

## 2018-09-14 DIAGNOSIS — Z7984 Long term (current) use of oral hypoglycemic drugs: Secondary | ICD-10-CM | POA: Insufficient documentation

## 2018-09-14 DIAGNOSIS — E11622 Type 2 diabetes mellitus with other skin ulcer: Secondary | ICD-10-CM | POA: Diagnosis present

## 2018-09-14 DIAGNOSIS — L97822 Non-pressure chronic ulcer of other part of left lower leg with fat layer exposed: Secondary | ICD-10-CM | POA: Insufficient documentation

## 2018-09-14 DIAGNOSIS — I5030 Unspecified diastolic (congestive) heart failure: Secondary | ICD-10-CM | POA: Insufficient documentation

## 2018-09-14 DIAGNOSIS — I872 Venous insufficiency (chronic) (peripheral): Secondary | ICD-10-CM | POA: Insufficient documentation

## 2018-09-14 DIAGNOSIS — I252 Old myocardial infarction: Secondary | ICD-10-CM | POA: Diagnosis not present

## 2018-09-14 DIAGNOSIS — I890 Lymphedema, not elsewhere classified: Secondary | ICD-10-CM | POA: Diagnosis not present

## 2018-09-19 ENCOUNTER — Emergency Department (HOSPITAL_COMMUNITY): Payer: Medicare Other

## 2018-09-19 ENCOUNTER — Encounter (HOSPITAL_COMMUNITY): Payer: Self-pay

## 2018-09-19 ENCOUNTER — Inpatient Hospital Stay (HOSPITAL_COMMUNITY)
Admission: EM | Admit: 2018-09-19 | Discharge: 2018-09-22 | DRG: 603 | Disposition: A | Discharge status: Home / Self Care | Payer: Medicare Other | Attending: Internal Medicine | Admitting: Internal Medicine

## 2018-09-19 ENCOUNTER — Other Ambulatory Visit: Payer: Self-pay

## 2018-09-19 DIAGNOSIS — Z91048 Other nonmedicinal substance allergy status: Secondary | ICD-10-CM

## 2018-09-19 DIAGNOSIS — L03116 Cellulitis of left lower limb: Secondary | ICD-10-CM | POA: Diagnosis not present

## 2018-09-19 DIAGNOSIS — K219 Gastro-esophageal reflux disease without esophagitis: Secondary | ICD-10-CM | POA: Diagnosis present

## 2018-09-19 DIAGNOSIS — I5032 Chronic diastolic (congestive) heart failure: Secondary | ICD-10-CM | POA: Diagnosis present

## 2018-09-19 DIAGNOSIS — Z20828 Contact with and (suspected) exposure to other viral communicable diseases: Secondary | ICD-10-CM | POA: Diagnosis present

## 2018-09-19 DIAGNOSIS — Z888 Allergy status to other drugs, medicaments and biological substances status: Secondary | ICD-10-CM

## 2018-09-19 DIAGNOSIS — Z6841 Body Mass Index (BMI) 40.0 and over, adult: Secondary | ICD-10-CM | POA: Diagnosis not present

## 2018-09-19 DIAGNOSIS — Z7984 Long term (current) use of oral hypoglycemic drugs: Secondary | ICD-10-CM

## 2018-09-19 DIAGNOSIS — E119 Type 2 diabetes mellitus without complications: Secondary | ICD-10-CM

## 2018-09-19 DIAGNOSIS — I10 Essential (primary) hypertension: Secondary | ICD-10-CM | POA: Diagnosis not present

## 2018-09-19 DIAGNOSIS — E7800 Pure hypercholesterolemia, unspecified: Secondary | ICD-10-CM | POA: Diagnosis present

## 2018-09-19 DIAGNOSIS — Z91040 Latex allergy status: Secondary | ICD-10-CM

## 2018-09-19 DIAGNOSIS — Z886 Allergy status to analgesic agent status: Secondary | ICD-10-CM | POA: Diagnosis not present

## 2018-09-19 DIAGNOSIS — Z79899 Other long term (current) drug therapy: Secondary | ICD-10-CM | POA: Diagnosis not present

## 2018-09-19 DIAGNOSIS — I872 Venous insufficiency (chronic) (peripheral): Secondary | ICD-10-CM | POA: Diagnosis present

## 2018-09-19 DIAGNOSIS — Z885 Allergy status to narcotic agent status: Secondary | ICD-10-CM

## 2018-09-19 DIAGNOSIS — I110 Hypertensive heart disease with heart failure: Secondary | ICD-10-CM | POA: Diagnosis present

## 2018-09-19 DIAGNOSIS — Z881 Allergy status to other antibiotic agents status: Secondary | ICD-10-CM | POA: Diagnosis not present

## 2018-09-19 DIAGNOSIS — J45909 Unspecified asthma, uncomplicated: Secondary | ICD-10-CM | POA: Diagnosis present

## 2018-09-19 DIAGNOSIS — E1165 Type 2 diabetes mellitus with hyperglycemia: Secondary | ICD-10-CM | POA: Diagnosis present

## 2018-09-19 DIAGNOSIS — M79605 Pain in left leg: Secondary | ICD-10-CM | POA: Diagnosis not present

## 2018-09-19 DIAGNOSIS — I119 Hypertensive heart disease without heart failure: Secondary | ICD-10-CM | POA: Diagnosis present

## 2018-09-19 DIAGNOSIS — E669 Obesity, unspecified: Secondary | ICD-10-CM | POA: Diagnosis present

## 2018-09-19 DIAGNOSIS — I4510 Unspecified right bundle-branch block: Secondary | ICD-10-CM | POA: Diagnosis present

## 2018-09-19 DIAGNOSIS — I82402 Acute embolism and thrombosis of unspecified deep veins of left lower extremity: Secondary | ICD-10-CM | POA: Diagnosis not present

## 2018-09-19 LAB — URINALYSIS, ROUTINE W REFLEX MICROSCOPIC
Bilirubin Urine: NEGATIVE
Glucose, UA: >=500 mg/dL — AB
Hgb urine dipstick: NEGATIVE
Ketones, ur: NEGATIVE mg/dL
Leukocytes,Ua: NEGATIVE
Nitrite: NEGATIVE
Protein, ur: NEGATIVE mg/dL
Specific Gravity, Urine: 1.017 (ref 1.005–1.030)
pH: 7 (ref 5.0–8.0)

## 2018-09-19 LAB — CBC WITH DIFFERENTIAL/PLATELET
Abs Immature Granulocytes: 0.03 10*3/uL (ref 0.00–0.07)
Basophils Absolute: 0 10*3/uL (ref 0.0–0.1)
Basophils Relative: 1 %
Eosinophils Absolute: 0.1 10*3/uL (ref 0.0–0.5)
Eosinophils Relative: 1 %
HCT: 46.3 % — ABNORMAL HIGH (ref 36.0–46.0)
Hemoglobin: 14.9 g/dL (ref 12.0–15.0)
Immature Granulocytes: 0 %
Lymphocytes Relative: 18 %
Lymphs Abs: 1.3 10*3/uL (ref 0.7–4.0)
MCH: 28.7 pg (ref 26.0–34.0)
MCHC: 32.2 g/dL (ref 30.0–36.0)
MCV: 89.2 fL (ref 80.0–100.0)
Monocytes Absolute: 0.4 10*3/uL (ref 0.1–1.0)
Monocytes Relative: 6 %
Neutro Abs: 5.4 10*3/uL (ref 1.7–7.7)
Neutrophils Relative %: 74 %
Platelets: 210 10*3/uL (ref 150–400)
RBC: 5.19 MIL/uL — ABNORMAL HIGH (ref 3.87–5.11)
RDW: 13.5 % (ref 11.5–15.5)
WBC: 7.3 10*3/uL (ref 4.0–10.5)
nRBC: 0 % (ref 0.0–0.2)

## 2018-09-19 LAB — LACTIC ACID, PLASMA: Lactic Acid, Venous: 1.6 mmol/L (ref 0.5–1.9)

## 2018-09-19 LAB — HEMOGLOBIN A1C
Hgb A1c MFr Bld: 9.4 % — ABNORMAL HIGH (ref 4.8–5.6)
Mean Plasma Glucose: 223.08 mg/dL

## 2018-09-19 LAB — COMPREHENSIVE METABOLIC PANEL
ALT: 20 U/L (ref 0–44)
AST: 15 U/L (ref 15–41)
Albumin: 3.7 g/dL (ref 3.5–5.0)
Alkaline Phosphatase: 73 U/L (ref 38–126)
Anion gap: 9 (ref 5–15)
BUN: 19 mg/dL (ref 8–23)
CO2: 26 mmol/L (ref 22–32)
Calcium: 9.1 mg/dL (ref 8.9–10.3)
Chloride: 101 mmol/L (ref 98–111)
Creatinine, Ser: 0.85 mg/dL (ref 0.44–1.00)
GFR calc Af Amer: >60 mL/min (ref >60)
GFR calc non Af Amer: >60 mL/min (ref >60)
Glucose, Bld: 248 mg/dL — ABNORMAL HIGH (ref 70–99)
Potassium: 3.6 mmol/L (ref 3.5–5.1)
Sodium: 136 mmol/L (ref 135–145)
Total Bilirubin: 0.8 mg/dL (ref 0.3–1.2)
Total Protein: 7.6 g/dL (ref 6.5–8.1)

## 2018-09-19 LAB — GLUCOSE, CAPILLARY: Glucose-Capillary: 181 mg/dL — ABNORMAL HIGH (ref 70–99)

## 2018-09-19 LAB — SARS CORONAVIRUS 2 BY RT PCR (HOSPITAL ORDER, PERFORMED IN ~~LOC~~ HOSPITAL LAB): SARS Coronavirus 2: NEGATIVE

## 2018-09-19 LAB — CBG MONITORING, ED: Glucose-Capillary: 227 mg/dL — ABNORMAL HIGH (ref 70–99)

## 2018-09-19 MED ORDER — PIPERACILLIN-TAZOBACTAM 3.375 G IVPB
3.3750 g | Freq: Three times a day (TID) | INTRAVENOUS | Status: DC
  Administered 2018-09-19 – 2018-09-21 (×5): 3.375 g via INTRAVENOUS
  Filled 2018-09-19 (×7): qty 50

## 2018-09-19 MED ORDER — INSULIN GLARGINE 100 UNIT/ML ~~LOC~~ SOLN
10.0000 [IU] | Freq: Every day | SUBCUTANEOUS | Status: DC
  Administered 2018-09-19 – 2018-09-20 (×2): 10 [IU] via SUBCUTANEOUS
  Filled 2018-09-19 (×3): qty 0.1

## 2018-09-19 MED ORDER — ONDANSETRON HCL 4 MG PO TABS: 4.0000 mg | ORAL_TABLET | Freq: Four times a day (QID) | ORAL | Status: DC | PRN

## 2018-09-19 MED ORDER — LISINOPRIL 10 MG PO TABS
10.0000 mg | ORAL_TABLET | Freq: Every day | ORAL | Status: DC
  Administered 2018-09-20 – 2018-09-22 (×3): 10 mg via ORAL
  Filled 2018-09-19 (×3): qty 1

## 2018-09-19 MED ORDER — ENOXAPARIN SODIUM 60 MG/0.6ML ~~LOC~~ SOLN
60.0000 mg | SUBCUTANEOUS | Status: DC
  Administered 2018-09-19 – 2018-09-21 (×3): 60 mg via SUBCUTANEOUS
  Filled 2018-09-19 (×3): qty 0.6

## 2018-09-19 MED ORDER — HYDROCODONE-ACETAMINOPHEN 5-325 MG PO TABS: 1.0000 | ORAL_TABLET | ORAL | Status: DC | PRN

## 2018-09-19 MED ORDER — VANCOMYCIN HCL 10 G IV SOLR
1750.0000 mg | INTRAVENOUS | Status: DC
  Administered 2018-09-20 – 2018-09-21 (×2): 1750 mg via INTRAVENOUS
  Filled 2018-09-19 (×3): qty 1750

## 2018-09-19 MED ORDER — INSULIN ASPART 100 UNIT/ML ~~LOC~~ SOLN
0.0000 [IU] | Freq: Every day | SUBCUTANEOUS | Status: DC
  Administered 2018-09-20 – 2018-09-21 (×2): 3 [IU] via SUBCUTANEOUS

## 2018-09-19 MED ORDER — ACETAMINOPHEN 325 MG PO TABS: 650.0000 mg | ORAL_TABLET | Freq: Four times a day (QID) | ORAL | Status: DC | PRN

## 2018-09-19 MED ORDER — ACETAMINOPHEN 650 MG RE SUPP: 650.0000 mg | Freq: Four times a day (QID) | RECTAL | Status: DC | PRN

## 2018-09-19 MED ORDER — PRAVASTATIN SODIUM 20 MG PO TABS
20.0000 mg | ORAL_TABLET | Freq: Every day | ORAL | Status: DC
  Administered 2018-09-20 – 2018-09-22 (×3): 20 mg via ORAL
  Filled 2018-09-19 (×3): qty 1

## 2018-09-19 MED ORDER — ALBUTEROL SULFATE (2.5 MG/3ML) 0.083% IN NEBU: 2.5000 mg | INHALATION_SOLUTION | Freq: Four times a day (QID) | RESPIRATORY_TRACT | Status: DC | PRN

## 2018-09-19 MED ORDER — FUROSEMIDE 10 MG/ML IJ SOLN
20.0000 mg | Freq: Once | INTRAMUSCULAR | Status: AC
  Administered 2018-09-19: 20 mg via INTRAVENOUS
  Filled 2018-09-19: qty 2

## 2018-09-19 MED ORDER — FLUTICASONE PROPIONATE 50 MCG/ACT NA SUSP
1.0000 | Freq: Every day | NASAL | Status: DC
  Administered 2018-09-21 – 2018-09-22 (×2): 1 via NASAL
  Filled 2018-09-19: qty 16

## 2018-09-19 MED ORDER — METOPROLOL SUCCINATE ER 50 MG PO TB24
50.0000 mg | ORAL_TABLET | Freq: Every day | ORAL | Status: DC
  Administered 2018-09-20 – 2018-09-22 (×3): 50 mg via ORAL
  Filled 2018-09-19 (×3): qty 1

## 2018-09-19 MED ORDER — PIPERACILLIN-TAZOBACTAM 3.375 G IVPB 30 MIN
3.3750 g | Freq: Once | INTRAVENOUS | Status: AC
  Administered 2018-09-19: 3.375 g via INTRAVENOUS
  Filled 2018-09-19: qty 50

## 2018-09-19 MED ORDER — ONDANSETRON HCL 4 MG/2ML IJ SOLN: 4.0000 mg | Freq: Four times a day (QID) | INTRAMUSCULAR | Status: DC | PRN

## 2018-09-19 MED ORDER — FAMOTIDINE 20 MG PO TABS: 10.0000 mg | ORAL_TABLET | Freq: Every day | ORAL | Status: DC | PRN

## 2018-09-19 MED ORDER — INSULIN ASPART 100 UNIT/ML ~~LOC~~ SOLN
0.0000 [IU] | Freq: Three times a day (TID) | SUBCUTANEOUS | Status: DC
  Administered 2018-09-19 – 2018-09-20 (×2): 2 [IU] via SUBCUTANEOUS
  Administered 2018-09-20: 3 [IU] via SUBCUTANEOUS
  Administered 2018-09-20 – 2018-09-21 (×2): 5 [IU] via SUBCUTANEOUS
  Administered 2018-09-21: 3 [IU] via SUBCUTANEOUS
  Administered 2018-09-21 – 2018-09-22 (×3): 5 [IU] via SUBCUTANEOUS

## 2018-09-19 MED ORDER — TRIAMTERENE-HCTZ 75-50 MG PO TABS
1.0000 | ORAL_TABLET | Freq: Every day | ORAL | Status: DC
  Administered 2018-09-20 – 2018-09-22 (×3): 1 via ORAL
  Filled 2018-09-19 (×3): qty 1

## 2018-09-19 MED ORDER — VANCOMYCIN HCL IN DEXTROSE 1-5 GM/200ML-% IV SOLN
1000.0000 mg | Freq: Once | INTRAVENOUS | Status: AC
  Administered 2018-09-19: 1000 mg via INTRAVENOUS
  Filled 2018-09-19: qty 200

## 2018-09-19 MED ORDER — HYDRALAZINE HCL 20 MG/ML IJ SOLN
10.0000 mg | Freq: Once | INTRAMUSCULAR | Status: AC
  Administered 2018-09-19: 10 mg via INTRAVENOUS
  Filled 2018-09-19: qty 1

## 2018-09-19 MED ORDER — VANCOMYCIN HCL 10 G IV SOLR
1500.0000 mg | INTRAVENOUS | Status: AC
  Administered 2018-09-19: 1500 mg via INTRAVENOUS
  Filled 2018-09-19: qty 1500

## 2018-09-19 NOTE — ED Notes (Signed)
ED TO INPATIENT HANDOFF REPORT  ED Nurse Name and Phone #:   S Name/Age/Gender Amanda Butler 63 y.o. female Room/Bed: 1322/1322-01  Code Status   Code Status: Prior  Home/SNF/Other Home Patient oriented to: self, place, time and situation Is this baseline? Yes   Triage Complete: Triage complete  Chief Complaint Left leg cellulitis [H82.993] Type 2 diabetes mellitus without complication, unspecified whether long term insulin use ([place]) [E11.9]  Triage Note Pt states that she had her left leg wrapped Tuesday. Pt states her dr told her to come to the ED for dressing changed. Pt states she was sitting on the couch and had a puddle under her.    Allergies Allergies  Allergen Reactions  . Bee Venom Anaphylaxis  . Iodine Shortness Of Breath and Rash  . Shellfish Allergy Anaphylaxis  . Actos [Pioglitazone] Other (See Comments)    Erratic heartbeat  . Aspirin Hives  . Latex Hives  . Metformin And Related Other (See Comments)    Patient reports flu-like symptoms and fatigue  . Morphine And Related Other (See Comments)    Childhood allergy (63 years old) , pt got sicker , temp went up, dr gave her the wrong medication  . Nsaids Hives  . Ciprofloxacin Palpitations    Joint pain    Level of Care/Admitting Diagnosis ED Disposition    ED Disposition Condition [facility] Area: [place] [[id number]]  Level of Care: Med-Surg [16]  Covid Evaluation: Screening Protocol (No Symptoms)  Diagnosis: Cellulitis [893810]  Admitting Physician: RAI, Vernon K [[id number]]  Attending Physician: RAI, RIPUDEEP K [[id number]]  Estimated length of stay: past midnight tomorrow  Certification:: I certify this patient will need inpatient services for at least 2 midnights  PT Class (Do Not Modify): Inpatient [101]  PT Acc Code (Do Not Modify): Private [1]       B Medical/Surgery History Past Medical History:  Diagnosis Date  . Arthritis    "knees" (09/13/2015)  .  Asthma   . Cellulitis   . Chronic diastolic CHF (congestive heart failure) ([place])    a. 09/2015 Echo: EF 55-60%, mild LVH, gr2DD, no rwma, mild MR, mildly dil LA.   Marland Kitchen GERD (gastroesophageal reflux disease)   . High cholesterol   . Hypertension   . Midsternal chest pain    a. 09/2015 MV: no ischemia/infarct, EF 61%; b. 09/2015 Low prob V:Q scan.  . Morbid obesity ([place])   . Obesity   . Pneumonia ~ 2000   "mild case"  . RBBB (right bundle branch block) approx 2010   Dr Marisue Humble did ECG and told her  . Type II diabetes mellitus ([place])    Past Surgical History:  Procedure Laterality Date  . BUNIONECTOMY Left   . COLONOSCOPY WITH PROPOFOL N/A 09/23/2016   Procedure: COLONOSCOPY WITH PROPOFOL;  Surgeon: Garlan Fair, MD;  [facility];  Service: Endoscopy;  Laterality: N/A;  . MULTIPLE TOOTH EXTRACTIONS  ~ 1975  . TENDON RELEASE Right    Leg  . TUBAL LIGATION  ~ 1983     A IV Location/Drains/Wounds Patient Lines/Drains/Airways Status   Active Line/Drains/Airways    Name:   Placement date:   Placement time:   Site:   Days:   Peripheral IV 09/19/18 Left Forearm   09/19/18    1230    Forearm   less than 1   Pressure Injury 02/19/18 Stage II -  Partial thickness loss of dermis presenting as a  shallow open ulcer with a red, pink wound bed without slough.   02/19/18    2047     212   Pressure Injury 02/19/18 Stage I -  Intact skin with non-blanchable redness of a localized area usually over a bony prominence.   02/19/18    2047     212   Wound / Incision (Open or Dehisced) 02/19/18 Diabetic ulcer Pretibial Left   02/19/18    2048    Pretibial   212          Intake/Output Last 24 hours No intake or output data in the 24 hours ending 09/19/18 1623  Labs/Imaging Results for orders placed or performed during the hospital encounter of 09/19/18 (from the past 48 hour(s))  CBC with Differential     Status: Abnormal   Collection Time: 09/19/18 12:25 PM  Result Value Ref Range    WBC 7.3 4.0 - 10.5 K/uL   RBC 5.19 (H) 3.87 - 5.11 MIL/uL   Hemoglobin 14.9 12.0 - 15.0 g/dL   HCT 46.3 (H) 36.0 - 46.0 %   MCV 89.2 80.0 - 100.0 fL   MCH 28.7 26.0 - 34.0 pg   MCHC 32.2 30.0 - 36.0 g/dL   RDW 13.5 11.5 - 15.5 %   Platelets 210 150 - 400 K/uL   nRBC 0.0 0.0 - 0.2 %   Neutrophils Relative % 74 %   Neutro Abs 5.4 1.7 - 7.7 K/uL   Lymphocytes Relative 18 %   Lymphs Abs 1.3 0.7 - 4.0 K/uL   Monocytes Relative 6 %   Monocytes Absolute 0.4 0.1 - 1.0 K/uL   Eosinophils Relative 1 %   Eosinophils Absolute 0.1 0.0 - 0.5 K/uL   Basophils Relative 1 %   Basophils Absolute 0.0 0.0 - 0.1 K/uL   Immature Granulocytes 0 %   Abs Immature Granulocytes 0.03 0.00 - 0.07 K/uL    Comment: Performed at [facility], [place] [address] [place], [place] [address]  Lactic acid, plasma     Status: None   Collection Time: 09/19/18 12:25 PM  Result Value Ref Range   Lactic Acid, Venous 1.6 0.5 - 1.9 mmol/L    Comment: Performed at [facility], [place] [address], [place], [place] [address]  Urinalysis, Routine w reflex microscopic     Status: Abnormal   Collection Time: 09/19/18 12:25 PM  Result Value Ref Range   Color, Urine YELLOW YELLOW   APPearance CLEAR CLEAR   Specific Gravity, Urine 1.017 1.005 - 1.030   pH 7.0 5.0 - 8.0   Glucose, UA >=500 (A) NEGATIVE mg/dL   Hgb urine dipstick NEGATIVE NEGATIVE   Bilirubin Urine NEGATIVE NEGATIVE   Ketones, ur NEGATIVE NEGATIVE mg/dL   Protein, ur NEGATIVE NEGATIVE mg/dL   Nitrite NEGATIVE NEGATIVE   Leukocytes,Ua NEGATIVE NEGATIVE   RBC / HPF 0-5 0 - 5 RBC/hpf   WBC, UA 0-5 0 - 5 WBC/hpf   Bacteria, UA RARE (A) NONE SEEN   Squamous Epithelial / LPF 0-5 0 - 5   Mucus PRESENT    Budding Yeast PRESENT    Hyaline Casts, UA PRESENT     Comment: Performed at [facility], [place] [address], [place], [place] [address]  Comprehensive metabolic panel     Status: Abnormal   Collection Time:  09/19/18 12:25 PM  Result Value Ref Range   Sodium 136 135 - 145 mmol/L   Potassium 3.6 3.5 - 5.1 mmol/L  Chloride 101 98 - 111 mmol/L   CO2 26 22 - 32 mmol/L   Glucose, Bld 248 (H) 70 - 99 mg/dL   BUN 19 8 - 23 mg/dL   Creatinine, Ser 0.85 0.44 - 1.00 mg/dL   Calcium 9.1 8.9 - 10.3 mg/dL   Total Protein 7.6 6.5 - 8.1 g/dL   Albumin 3.7 3.5 - 5.0 g/dL   AST 15 15 - 41 U/L   ALT 20 0 - 44 U/L   Alkaline Phosphatase 73 38 - 126 U/L   Total Bilirubin 0.8 0.3 - 1.2 mg/dL   GFR calc non Af Amer >60 >60 mL/min   GFR calc Af Amer >60 >60 mL/min   Anion gap 9 5 - 15    Comment: Performed at [facility], [place] [address], [place], [place] [address]  CBG monitoring, ED     Status: Abnormal   Collection Time: 09/19/18 12:28 PM  Result Value Ref Range   Glucose-Capillary 227 (H) 70 - 99 mg/dL  SARS Coronavirus 2 (CEPHEID - Performed in [place] hospital lab), Hosp Order     Status: None   Collection Time: 09/19/18 12:39 PM  Result Value Ref Range   SARS Coronavirus 2 NEGATIVE NEGATIVE    Comment: (NOTE) If result is NEGATIVE SARS-CoV-2 target nucleic acids are NOT DETECTED. The SARS-CoV-2 RNA is generally detectable in upper and lower  respiratory specimens during the acute phase of infection. The lowest  concentration of SARS-CoV-2 viral copies this assay can detect is 250  copies / mL. A negative result does not preclude SARS-CoV-2 infection  and should not be used as the sole basis for treatment or other  patient management decisions.  A negative result may occur with  improper specimen collection / handling, submission of specimen other  than nasopharyngeal swab, presence of viral mutation(s) within the  areas targeted by this assay, and inadequate number of viral copies  (<250 copies / mL). A negative result must be combined with clinical  observations, patient history, and epidemiological information. If result is POSITIVE SARS-CoV-2 target nucleic acids are  DETECTED. The SARS-CoV-2 RNA is generally detectable in upper and lower  respiratory specimens dur ing the acute phase of infection.  Positive  results are indicative of active infection with SARS-CoV-2.  Clinical  correlation with patient history and other diagnostic information is  necessary to determine patient infection status.  Positive results do  not rule out bacterial infection or co-infection with other viruses. If result is PRESUMPTIVE POSTIVE SARS-CoV-2 nucleic acids MAY BE PRESENT.   A presumptive positive result was obtained on the submitted specimen  and confirmed on repeat testing.  While 2019 novel coronavirus  (SARS-CoV-2) nucleic acids may be present in the submitted sample  additional confirmatory testing may be necessary for epidemiological  and / or clinical management purposes  to differentiate between  SARS-CoV-2 and other Sarbecovirus currently known to infect humans.  If clinically indicated additional testing with an alternate test  methodology [id number]) is advised. The SARS-CoV-2 RNA is generally  detectable in upper and lower respiratory sp ecimens during the acute  phase of infection. The expected result is Negative. Fact Sheet for Patients:  StrictlyIdeas.no Fact Sheet for Healthcare Providers: BankingDealers.co.za This test is not yet approved or cleared by the [place] FDA and has been authorized for detection and/or diagnosis of SARS-CoV-2 by FDA under an Emergency Use Authorization (EUA).  This EUA will remain in effect (meaning this test can be used)  for the duration of the COVID-19 declaration under Section 564(b)(1) of the Act, 21 U.S.C. section 360bbb-3(b)(1), unless the authorization is terminated or revoked sooner. Performed at [facility], [place] [address] [place], [place] [address]    Dg Tibia/fibula Left  Result Date: 09/19/2018 CLINICAL DATA:  Left leg cellulitis.  EXAM: LEFT TIBIA AND FIBULA - 2 VIEW; LEFT ANKLE COMPLETE - 3+ VIEW COMPARISON:  None. FINDINGS: No acute fracture or dislocation. Old trauma to the medial malleolus. Chronic deformity of the fifth metatarsal neck. The ankle mortise is symmetric. The talar dome is intact. No tibiotalar joint effusion. Mild tibiotalar and midfoot degenerative spurring. Moderate osteoarthritis of the left knee. Bone mineralization is normal. Large Achilles and plantar enthesophytes. Diffuse soft tissue swelling. IMPRESSION: 1. Diffuse soft tissue swelling.  No acute osseous abnormality. Electronically Signed   By: Titus Dubin M.D.   On: 09/19/2018 12:59   Dg Ankle Complete Left  Result Date: 09/19/2018 CLINICAL DATA:  Left leg cellulitis. EXAM: LEFT TIBIA AND FIBULA - 2 VIEW; LEFT ANKLE COMPLETE - 3+ VIEW COMPARISON:  None. FINDINGS: No acute fracture or dislocation. Old trauma to the medial malleolus. Chronic deformity of the fifth metatarsal neck. The ankle mortise is symmetric. The talar dome is intact. No tibiotalar joint effusion. Mild tibiotalar and midfoot degenerative spurring. Moderate osteoarthritis of the left knee. Bone mineralization is normal. Large Achilles and plantar enthesophytes. Diffuse soft tissue swelling. IMPRESSION: 1. Diffuse soft tissue swelling.  No acute osseous abnormality. Electronically Signed   By: Titus Dubin M.D.   On: 09/19/2018 12:59    Pending Labs Unresulted Labs (From admission, onward)    Start     Ordered   09/19/18 1540  Hemoglobin A1c  Once,   R    Comments:  To assess prior glycemic control    09/19/18 1539   09/19/18 1133  Culture, blood (routine x 2)  BLOOD CULTURE X 2,   STAT     09/19/18 1132   09/19/18 1131  Lactic acid, plasma  Now then every 2 hours,   STAT     09/19/18 1131   09/19/18 1131  Wound or Superficial Culture  Once,   STAT     09/19/18 1131   Signed and Held  Urine culture  Once,   R     Signed and Held   Signed and Held  Hemoglobin A1c  Once,    R     Signed and Held   Signed and Held  Basic metabolic panel  Tomorrow morning,   R     Signed and Held   Signed and Held  CBC  Tomorrow morning,   R     Signed and Held   Signed and Held  CBC  (enoxaparin (LOVENOX)    CrCl >/= 30 ml/min)  Once,   R    Comments:  Baseline for enoxaparin therapy IF NOT ALREADY DRAWN.  Notify MD if PLT < 100 K.    Signed and Held   Signed and Held  Creatinine, serum  (enoxaparin (LOVENOX)    CrCl >/= 30 ml/min)  Once,   R    Comments:  Baseline for enoxaparin therapy IF NOT ALREADY DRAWN.    Signed and Held   Signed and Held  Creatinine, serum  (enoxaparin (LOVENOX)    CrCl >/= 30 ml/min)  Weekly,   R    Comments:  while on enoxaparin therapy    Signed and Held  Vitals/Pain Today's Vitals   09/19/18 1400 09/19/18 1430 09/19/18 1500 09/19/18 1530  BP: (!) 160/75 (!) 197/83 (!) 166/85 (!) 172/76  Pulse: 67 68 71 63  Resp: 18 20 20 19   Temp:      TempSrc:      SpO2: 98% 99% 99% 100%  Weight:      Height:      PainSc:        Isolation Precautions No active isolations  Medications Medications  hydrALAZINE (APRESOLINE) injection 10 mg (has no administration in time range)  insulin aspart (novoLOG) injection 0-5 Units (has no administration in time range)  insulin aspart (novoLOG) injection 0-15 Units (has no administration in time range)  insulin glargine (LANTUS) injection 10 Units (has no administration in time range)  furosemide (LASIX) injection 20 mg (has no administration in time range)  piperacillin-tazobactam (ZOSYN) IVPB 3.375 g (0 g Intravenous Stopped 09/19/18 1354)  vancomycin (VANCOCIN) IVPB 1000 mg/200 mL premix (0 mg Intravenous Stopped 09/19/18 1419)    Mobility walks with device Moderate fall risk       R Recommendations: See Admitting Provider Note  Report given to:   Additional Notes:

## 2018-09-19 NOTE — H&P (Signed)
History and Physical        Hospital Admission Note Date: 09/19/2018  Patient name: Amanda Butler Medical record number: [id number] Date of birth: 04-02-1956 Age: 63 y.o. Gender: female  PCP: Gaynelle Arabian, MD    Patient coming from: home   I have reviewed all records in the [facility].    Chief Complaint:  Left leg pain, tenderness and redness, progressively worsening in the last 3 months  HPI: Patient is a 62 year old female with history of type 2 diabetes mellitus, hypertension, diastolic CHF, recurrent cellulitis in the left leg presented to ED with worsening left leg pain, redness and tenderness.  Patient reports that she has seen her PCP as well as the wound clinic in the last 3 months and has been on 3 different rounds of antibiotics including Augmentin and clindamycin and Levaquin with no significant improvement.  Patient was seen in the wound clinic this week on 6/2 and had her legs wrapped but yesterday she started having foul-smelling drainage noticed from her dressing.  She called the wound clinic who recommended the patient to come to the ER for further evaluation.  She states having subjective fever and chills in last 2 weeks, not on antibiotics in the last 2 weeks.  COVID-19 test negative  ED work-up/course:  In ED, temp 98.3, respirate 18, pulse 89, BP 204/76 WBC 7.3, hemoglobin 14.9, sodium 136, creatinine 0.8  Left tib-fib x-ray and ankle x-ray showed soft tissue swelling, no acute osseous abnormality.  Review of Systems: Positives marked in 'bold' Constitutional: Denies diaphoresis, poor appetite and fatigue. + Subjective fevers 2 weeks ago HEENT: Denies photophobia, eye pain, redness, hearing loss, ear pain, congestion, sore throat, rhinorrhea, sneezing, mouth sores, trouble swallowing, neck pain, neck stiffness and tinnitus.   Respiratory: Denies  SOB, DOE, cough, chest tightness,  and wheezing.   Cardiovascular: Denies chest pain, palpitations and leg swelling.  Gastrointestinal: Denies nausea, vomiting, abdominal pain, diarrhea, constipation, blood in stool and abdominal distention.  Genitourinary: Denies dysuria, urgency, frequency, hematuria, flank pain and difficulty urinating.  Musculoskeletal: Denies myalgias, back pain, joint swelling, arthralgias and gait problem.  Skin: Worsening left leg cellulitis, no improvement in the last 3 months Neurological: Denies dizziness, seizures, syncope, weakness, light-headedness, numbness and headaches.  Hematological: Denies adenopathy. Easy bruising, personal or family bleeding history  Psychiatric/Behavioral: Denies suicidal ideation, mood changes, confusion, nervousness, sleep disturbance and agitation  Past Medical History: Past Medical History:  Diagnosis Date   Arthritis    "knees" (09/13/2015)   Asthma    Cellulitis    Chronic diastolic CHF (congestive heart failure) ([place])    a. 09/2015 Echo: EF 55-60%, mild LVH, gr2DD, no rwma, mild MR, mildly dil LA.    GERD (gastroesophageal reflux disease)    High cholesterol    Hypertension    Midsternal chest pain    a. 09/2015 MV: no ischemia/infarct, EF 61%; b. 09/2015 Low prob V:Q scan.   Morbid obesity ([place])    Obesity    Pneumonia ~ 2000   "mild case"   RBBB (right bundle branch block) approx 2010   Dr Marisue Humble did ECG and told her   Type II diabetes  mellitus [facility])     Past Surgical History:  Procedure Laterality Date   BUNIONECTOMY Left    COLONOSCOPY WITH PROPOFOL N/A 09/23/2016   Procedure: COLONOSCOPY WITH PROPOFOL;  Surgeon: Garlan Fair, MD;  Location: [facility] END[facility]COPY;  Service: Endoscopy;  Laterality: N/A;   MULTIPLE TOOTH EXTRACTIONS  ~ [place] Right    Leg   TUBAL LIGATION  ~ 1983    Medications: Prior to Admission medications   Medication Sig Start Date End Date Taking? Authorizing  Provider  acetaminophen (TYLENOL) 325 MG tablet Take 650 mg by mouth every 6 (six) hours as needed for moderate pain.   Yes [provider]  albuterol (PROVENTIL HFA;VENTOLIN HFA) 108 (90 BASE) MCG/ACT inhaler Inhale 2 puffs into the lungs 2 (two) times daily as needed for wheezing or shortness of breath.    Yes [provider]  albuterol (PROVENTIL) (2.5 MG/3ML) 0.083% nebulizer solution Take 2.5 mg by nebulization every 6 (six) hours as needed for wheezing or shortness of breath.   Yes [provider]  famotidine (PEPCID) 10 MG tablet Take 10 mg by mouth daily as needed for heartburn.    Yes [provider]  glimepiride (AMARYL) 2 MG tablet Take 2 mg by mouth daily. 04/07/18  Yes [provider]  lisinopril (PRINIVIL,ZESTRIL) 10 MG tablet Take 1 tablet (10 mg total) by mouth daily. 07/08/16  Yes Croitoru, Mihai, MD  metoprolol succinate (TOPROL-XL) 50 MG 24 hr tablet Take 50 mg by mouth daily. Take with or immediately following a meal.   Yes [provider]  mometasone (NASONEX) 50 MCG/ACT nasal spray Place 2 sprays into the nose daily.    Yes [provider]  pravastatin (PRAVACHOL) 20 MG tablet Take 20 mg by mouth daily.   Yes [provider]  repaglinide (PRANDIN) 1 MG tablet Take 1 mg by mouth See admin instructions. Take 1 tablet 15 to 30 mins before meals once a day for diabetes 08/19/18  Yes [provider]  triamterene-hydrochlorothiazide (MAXZIDE) 75-50 MG per tablet Take 1 tablet by mouth daily.   Yes [provider]  cephALEXin (KEFLEX) 500 MG capsule Take 1 capsule (500 mg total) by mouth 4 (four) times daily. Patient not taking: Reported on 07/04/2018 05/19/18   Davonna Belling, MD  ondansetron (ZOFRAN ODT) 8 MG disintegrating tablet Take 1 tablet (8 mg total) by mouth every 8 (eight) hours as needed for nausea or vomiting. Patient not taking: Reported on 07/04/2018 05/08/18   Pattricia Boss, MD    ondansetron (ZOFRAN) 4 MG tablet Take 1 tablet (4 mg total) by mouth every 6 (six) hours. Patient not taking: Reported on 04/27/2018 03/17/18   Nat Christen, MD    Allergies:   Allergies  Allergen Reactions   Bee Venom Anaphylaxis   Iodine Shortness Of Breath and Rash   Shellfish Allergy Anaphylaxis   Actos [Pioglitazone] Other (See Comments)    Erratic heartbeat   Aspirin Hives   Latex Hives   Metformin And Related Other (See Comments)    Patient reports flu-like symptoms and fatigue   Morphine And Related Other (See Comments)    Childhood allergy (63 years old) , pt got sicker , temp went up, dr gave her the wrong medication   Nsaids Hives   Ciprofloxacin Palpitations    Joint pain    Social History:  reports that she has never smoked. She has never used smokeless tobacco. She reports that she does not drink alcohol or  use drugs.  Family History: Family History  Problem Relation Age of Onset   Diabetes Mother    Hypertension Mother    Diabetes Father    Hypertension Father    Heart attack Father 74   Diabetes Maternal Aunt    Diabetes Maternal Grandmother    Hypertension Maternal Grandmother    Cancer Maternal Grandfather    Diabetes Maternal Grandfather    Hypertension Maternal Grandfather    Asthma Paternal Grandmother    Diabetes Paternal Grandmother    Hypertension Paternal Grandmother    Diabetes Paternal Grandfather    Hypertension Paternal Grandfather    Heart failure Paternal Grandfather     Physical Exam: Blood pressure (!) 172/76, pulse 63, temperature 98.3 F (36.8 C), temperature source Oral, resp. rate 19, height 5\' 6"  (1.676 m), weight 122.5 kg, SpO2 100 %. General: Alert, awake, oriented x3, in no acute distress. Eyes: pink conjunctiva,anicteric sclera, pupils equal and reactive to light and accomodation, HEENT: normocephalic, atraumatic, oropharynx clear Neck: supple, no masses or lymphadenopathy, no goiter, no bruits,  no JVD CVS: Regular rate and rhythm, without murmurs, rubs or gallops. 1-2+ lower extremity edema, L>R Resp : Clear to auscultation bilaterally, no wheezing, rales or rhonchi. GI : Soft, nontender, nondistended, positive bowel sounds, no masses. No hepatomegaly. No hernia.  Musculoskeletal: No clubbing or cyanosis, positive pedal pulses. No contracture. ROM intact  Neuro: Grossly intact, no focal neurological deficits, strength 5/5 upper and lower extremities bilaterally Psych: alert and oriented x 3, normal mood and affect Skin: see below, redness, tender and warm left lower extremity       LABS on Admission: I have personally reviewed all the labs and imagings below    Basic Metabolic Panel: Recent Labs  Lab 09/19/18 1225  NA 136  K 3.6  CL 101  CO2 26  GLUCOSE 248*  BUN 19  CREATININE 0.85  CALCIUM 9.1   Liver Function Tests: Recent Labs  Lab 09/19/18 1225  AST 15  ALT 20  ALKPHOS 73  BILITOT 0.8  PROT 7.6  ALBUMIN 3.7   No results for input(s): LIPASE, AMYLASE in the last 168 hours. No results for input(s): AMMONIA in the last 168 hours. CBC: Recent Labs  Lab 09/19/18 1225  WBC 7.3  NEUTROABS 5.4  HGB 14.9  HCT 46.3*  MCV 89.2  PLT 210   Cardiac Enzymes: No results for input(s): CKTOTAL, CKMB, CKMBINDEX, TROPONINI in the last 168 hours. BNP: Invalid input(s): POCBNP CBG: Recent Labs  Lab 09/19/18 1228  GLUCAP 227*    Radiological Exams on Admission:  Dg Tibia/fibula Left  Result Date: 09/19/2018 CLINICAL DATA:  Left leg cellulitis. EXAM: LEFT TIBIA AND FIBULA - 2 VIEW; LEFT ANKLE COMPLETE - 3+ VIEW COMPARISON:  None. FINDINGS: No acute fracture or dislocation. Old trauma to the medial malleolus. Chronic deformity of the fifth metatarsal neck. The ankle mortise is symmetric. The talar dome is intact. No tibiotalar joint effusion. Mild tibiotalar and midfoot degenerative spurring. Moderate osteoarthritis of the left knee. Bone mineralization is  normal. Large Achilles and plantar enthesophytes. Diffuse soft tissue swelling. IMPRESSION: 1. Diffuse soft tissue swelling.  No acute osseous abnormality. Electronically Signed   By: Titus Dubin M.D.   On: 09/19/2018 12:59   Dg Ankle Complete Left  Result Date: 09/19/2018 CLINICAL DATA:  Left leg cellulitis. EXAM: LEFT TIBIA AND FIBULA - 2 VIEW; LEFT ANKLE COMPLETE - 3+ VIEW COMPARISON:  None. FINDINGS: No acute fracture or dislocation. Old trauma to the  medial malleolus. Chronic deformity of the fifth metatarsal neck. The ankle mortise is symmetric. The talar dome is intact. No tibiotalar joint effusion. Mild tibiotalar and midfoot degenerative spurring. Moderate osteoarthritis of the left knee. Bone mineralization is normal. Large Achilles and plantar enthesophytes. Diffuse soft tissue swelling. IMPRESSION: 1. Diffuse soft tissue swelling.  No acute osseous abnormality. Electronically Signed   By: Titus Dubin M.D.   On: 09/19/2018 12:59      EKG: Independently reviewed.  Rate 70, normal sinus rhythm, RBBB   Assessment/Plan Principal Problem:   Left leg cellulitis outpatient failure to therapy, multiple rounds of antibiotics -Patient has been on 3 different antibiotics in the last 3 months, has also followed with wound care however no improvement and was draining yesterday -Placed on IV vancomycin and Zosyn (uncontrolled diabetic) -Left leg seems more edematous and tight, obtain Doppler ultrasound of the left leg to rule out DVT, Lasix 20 mg IV x1 -Wound care consult once cellulitis starts improving  Active Problems: Accelerated hypertension -BP 204/76 at the time of triage -Restart home medications, hydralazine 10 mg IV x1 in ED, Lasix 20 mg IV x1    Diabetes mellitus type 2, uncontrolled, with hyperglycemia and cellulitis (HCC) -Obtain hemoglobin A1c -Placed on moderate sliding scale insulin, Lantus 10 units qhs -Hold oral hypoglycemics    Obesity -Counseled on diet and  weight control    GERD (gastroesophageal reflux disease) -Continue famotidine    Chronic diastolic CHF (congestive heart failure) ([place]) -Will give 1 dose of Lasix 20 mg IV, reassess - strict I's and O's, daily weights   DVT prophylaxis: Lovenox  CODE STATUS: Full CODE STATUS  Consults called: None  Family Communication: Admission, patients condition and plan of care including tests being ordered have been discussed with the patient  who indicates understanding and agree with the plan and Code Status  Admission status: Inpatient MedSurg  Disposition plan: Further plan will depend as patient's clinical course evolves and further radiologic and laboratory data become available.    At the time of admission, it appears that the appropriate admission status for this patient is INPATIENT . This is judged to be reasonable and necessary in order to provide the required intensity of service to ensure the patient's safety given the presenting symptoms worsening left lower extremity cellulitis with outpatient failure to treatment, physical exam findings, and initial radiographic and laboratory data in the context of their chronic comorbidities.  The medical decision making on this patient was of high complexity and the patient is at high risk for clinical deterioration, therefore this is a level 3 visit.   Time Spent on Admission: 32mins    Karynn Deblasi M.D. [facility] 09/19/2018, 3:44 PM

## 2018-09-19 NOTE — Progress Notes (Signed)
Pharmacy Antibiotic Note  Amanda Butler is a 63 y.o. female admitted on 09/19/2018 with cellulitis.  In the ED, patient received Vancomycin 1gm IV and Zosyn 3.375gm IV x 1 dose each.  Pharmacy has been consulted for Vancomycin and Zosyn dosing.  Plan:  Zosyn 3.375g IV q8h (4 hour infusion).   Will give an additional Vancomycin 1500mg  IV dose x 1 now for total loading dose of 2500 (20 mg/kg).  Then will continue with Vancomycin 1750 mg IV Q 24 hrs. Goal AUC 400-550.  Expected AUC: 514.5  SCr used: 0.85  Check daily SCr while on both Vancomycin and Zosyn   Height: 5\' 6"  (167.6 cm) Weight: 269 lb 6.4 oz (122.2 kg) IBW/kg (Calculated) : 59.3  Temp (24hrs), Avg:98.2 F (36.8 C), Min:98 F (36.7 C), Max:98.3 F (36.8 C)  Recent Labs  Lab 09/19/18 1225  WBC 7.3  CREATININE 0.85  LATICACIDVEN 1.6    Estimated Creatinine Clearance: 88 mL/min (by C-G formula based on SCr of 0.85 mg/dL).    Allergies  Allergen Reactions  . Bee Venom Anaphylaxis  . Iodine Shortness Of Breath and Rash  . Shellfish Allergy Anaphylaxis  . Actos [Pioglitazone] Other (See Comments)    Erratic heartbeat  . Aspirin Hives  . Latex Hives  . Metformin And Related Other (See Comments)    Patient reports flu-like symptoms and fatigue  . Morphine And Related Other (See Comments)    Childhood allergy (63 years old) , pt got sicker , temp went up, dr gave her the wrong medication  . Nsaids Hives  . Ciprofloxacin Palpitations    Joint pain    Antimicrobials this admission: 6/7 Vanc >>   6/7 Zosyn >>    Dose adjustments this admission:    Microbiology results: 6/7 BCx: ordered 6/7 Wound Cx: ordered   6/7 Covid 19: negative     Thank you for allowing pharmacy to be a part of this patient's care.  Everette Rank, PharmD 09/19/2018 8:25 PM

## 2018-09-19 NOTE — ED Triage Notes (Signed)
Pt states that she had her left leg wrapped Tuesday. Pt states her dr told her to come to the ED for dressing changed. Pt states she was sitting on the couch and had a puddle under her.

## 2018-09-19 NOTE — ED Provider Notes (Signed)
[facility] DEPT Provider Note   CSN: [id number] Arrival date & time: 09/19/18  7616    History   Chief Complaint Chief Complaint  Patient presents with  . Cellulitis    HPI Amanda Butler is a 63 y.o. female with PMHx Type II DM, HTN, CHF with EF 55-60% who presents to the ED for "dressing change." Pt reports left leg cellulitis for which she has been seeing her PCP as well as wound clinic for the past 3 months; she has been on 3 different antibiotics including augmentin, levaquin, and clindamycin. Pt last saw wound clinic on Tuesday 06/02 and had her leg wrapped; she reports noticing drainage from her dressing yesterday with a foul smell; patient called wound clinic who told her to come to the ED last night but patient wanted to wait until today due to the curfew that was in place last night. Pt states she has not been on any antibiotics for the past 3-4 weeks. Denies fever or chills at home. No other complaints at this time.        Past Medical History:  Diagnosis Date  . Arthritis    "knees" (09/13/2015)  . Asthma   . Cellulitis   . Chronic diastolic CHF (congestive heart failure) ([place])    a. 09/2015 Echo: EF 55-60%, mild LVH, gr2DD, no rwma, mild MR, mildly dil LA.   Marland Kitchen GERD (gastroesophageal reflux disease)   . High cholesterol   . Hypertension   . Midsternal chest pain    a. 09/2015 MV: no ischemia/infarct, EF 61%; b. 09/2015 Low prob V:Q scan.  . Morbid obesity ([place])   . Obesity   . Pneumonia ~ 2000   "mild case"  . RBBB (right bundle branch block) approx 2010   Dr Marisue Humble did ECG and told her  . Type II diabetes mellitus [facility])     Patient Active Problem List   Diagnosis Date Noted  . Accelerated hypertension 09/19/2018  . Pressure injury of skin 02/20/2018  . Cellulitis 10/02/2017  . 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  . Closed nondisplaced fracture of greater tuberosity of left humerus 04/23/2017   . Type II diabetes mellitus ([place])   . High cholesterol   . Morbid obesity ([place])   . Chronic diastolic CHF (congestive heart failure) ([place])   . Midsternal chest pain   . Pain in the chest   . Essential hypertension   . Morbid obesity due to excess calories ([place])   . Chest pain 09/13/2015  . Bilateral lower extremity edema 09/13/2015  . Diabetes mellitus type 2, diet-controlled ([place]) 09/13/2015  . Obesity 09/13/2015  . Total bilirubin, elevated 09/13/2015  . HTN (hypertension) 09/13/2015  . Stasis dermatitis of both legs 09/13/2015  . Left leg cellulitis 09/13/2015  . RBBB 09/13/2015  . Asthma 09/13/2015  . GERD (gastroesophageal reflux disease) 09/13/2015  . 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    Past Surgical History:  Procedure Laterality Date  . BUNIONECTOMY Left   . COLONOSCOPY WITH PROPOFOL N/A 09/23/2016   Procedure: COLONOSCOPY WITH PROPOFOL;  Surgeon: Garlan Fair, MD;  Location: [facility] END[facility];  Service: Endoscopy;  Laterality: N/A;  . MULTIPLE TOOTH EXTRACTIONS  ~ 1975  . TENDON RELEASE Right    Leg  . TUBAL LIGATION  ~ 1983     OB History   No obstetric history on file.      Home Medications    Prior to Admission medications   Medication Sig  Start Date End Date Taking? Authorizing Provider  acetaminophen (TYLENOL) 325 MG tablet Take 650 mg by mouth every 6 (six) hours as needed for moderate pain.   Yes [provider]  albuterol (PROVENTIL HFA;VENTOLIN HFA) 108 (90 BASE) MCG/ACT inhaler Inhale 2 puffs into the lungs 2 (two) times daily as needed for wheezing or shortness of breath.    Yes [provider]  albuterol (PROVENTIL) (2.5 MG/3ML) 0.083% nebulizer solution Take 2.5 mg by nebulization every 6 (six) hours as needed for wheezing or shortness of breath.   Yes [provider]  famotidine (PEPCID) 10 MG tablet Take 10 mg by mouth daily as needed for heartburn.    Yes [provider]  glimepiride (AMARYL) 2 MG tablet Take 2  mg by mouth daily. 04/07/18  Yes [provider]  lisinopril (PRINIVIL,ZESTRIL) 10 MG tablet Take 1 tablet (10 mg total) by mouth daily. 07/08/16  Yes Croitoru, Mihai, MD  metoprolol succinate (TOPROL-XL) 50 MG 24 hr tablet Take 50 mg by mouth daily. Take with or immediately following a meal.   Yes [provider]  mometasone (NASONEX) 50 MCG/ACT nasal spray Place 2 sprays into the nose daily.    Yes [provider]  pravastatin (PRAVACHOL) 20 MG tablet Take 20 mg by mouth daily.   Yes [provider]  repaglinide (PRANDIN) 1 MG tablet Take 1 mg by mouth See admin instructions. Take 1 tablet 15 to 30 mins before meals once a day for diabetes 08/19/18  Yes [provider]  triamterene-hydrochlorothiazide (MAXZIDE) 75-50 MG per tablet Take 1 tablet by mouth daily.   Yes [provider]  cephALEXin (KEFLEX) 500 MG capsule Take 1 capsule (500 mg total) by mouth 4 (four) times daily. Patient not taking: Reported on 07/04/2018 05/19/18   Davonna Belling, MD  ondansetron (ZOFRAN ODT) 8 MG disintegrating tablet Take 1 tablet (8 mg total) by mouth every 8 (eight) hours as needed for nausea or vomiting. Patient not taking: Reported on 07/04/2018 05/08/18   Pattricia Boss, MD  ondansetron (ZOFRAN) 4 MG tablet Take 1 tablet (4 mg total) by mouth every 6 (six) hours. Patient not taking: Reported on 04/27/2018 03/17/18   Nat Christen, MD    Family History Family History  Problem Relation Age of Onset  . Diabetes Mother   . Hypertension Mother   . Diabetes Father   . Hypertension Father   . Heart attack Father 64  . Diabetes Maternal Aunt   . Diabetes Maternal Grandmother   . Hypertension Maternal Grandmother   . Cancer Maternal Grandfather   . Diabetes Maternal Grandfather   . Hypertension Maternal Grandfather   . Asthma Paternal Grandmother   . Diabetes Paternal Grandmother   . Hypertension Paternal Grandmother   . Diabetes Paternal Grandfather   .  Hypertension Paternal Grandfather   . Heart failure Paternal Grandfather     Social History Social History   Tobacco Use  . Smoking status: Never Smoker  . Smokeless tobacco: Never Used  Substance Use Topics  . Alcohol use: No  . Drug use: No     Allergies   Bee venom; Iodine; Shellfish allergy; Actos [pioglitazone]; Aspirin; Latex; Metformin and related; Morphine and related; Nsaids; and Ciprofloxacin   Review of Systems Review of Systems  Constitutional: Negative for chills and fever.  HENT: Negative for congestion.   Eyes: Negative for redness.  Respiratory: Negative for cough and shortness of breath.   Cardiovascular: Negative for chest pain.  Gastrointestinal: Negative  for abdominal pain, nausea and vomiting.  Genitourinary: Negative for difficulty urinating.  Musculoskeletal: Positive for arthralgias.  Skin: Positive for wound.  Neurological: Negative for headaches.     Physical Exam Updated Vital Signs BP (!) 204/76 (BP Location: Right Arm)   Pulse 89   Temp 98.3 F (36.8 C) (Oral)   Resp 18   Ht 5\' 6"  (1.676 m)   Wt 122.5 kg   SpO2 97%   BMI 43.58 kg/m   Physical Exam Vitals signs and nursing note reviewed.  Constitutional:      Appearance: She is obese. She is not ill-appearing.  HENT:     Head: Normocephalic and atraumatic.  Eyes:     Conjunctiva/sclera: Conjunctivae normal.  Neck:     Musculoskeletal: Neck supple.  Cardiovascular:     Rate and Rhythm: Normal rate and regular rhythm.     Pulses: Normal pulses.     Comments: 2+ DP pulses bilaterally Pulmonary:     Effort: Pulmonary effort is normal.     Breath sounds: Normal breath sounds. No wheezing, rhonchi or rales.  Abdominal:     Palpations: Abdomen is soft.     Tenderness: There is no abdominal tenderness. There is no guarding or rebound.  Musculoskeletal:     Comments: See photos below for left leg; foul smelling leg with increased warmth over area and mild tenderness to palpation;  2+ DP pulse on left foot  Skin:    General: Skin is warm and dry.  Neurological:     Mental Status: She is alert.           ED Treatments / Results  Labs (all labs ordered are listed, but only abnormal results are displayed) Labs Reviewed  CBC WITH DIFFERENTIAL/PLATELET - Abnormal; Notable for the following components:      Result Value   RBC 5.19 (*)    HCT 46.3 (*)    All other components within normal limits  URINALYSIS, ROUTINE W REFLEX MICROSCOPIC - Abnormal; Notable for the following components:   Glucose, UA >=500 (*)    Bacteria, UA RARE (*)    All other components within normal limits  COMPREHENSIVE METABOLIC PANEL - Abnormal; Notable for the following components:   Glucose, Bld 248 (*)    All other components within normal limits  CBG MONITORING, ED - Abnormal; Notable for the following components:   Glucose-Capillary 227 (*)    All other components within normal limits  SARS CORONAVIRUS 2 (HOSPITAL ORDER, [facility])  AEROBIC CULTURE (SUPERFICIAL SPECIMEN)  CULTURE, BLOOD (ROUTINE X 2)  CULTURE, BLOOD (ROUTINE X 2)  URINE CULTURE  LACTIC ACID, PLASMA  LACTIC ACID, PLASMA  HEMOGLOBIN A1C  HEMOGLOBIN A1C  CBC  CREATININE, SERUM    EKG EKG Interpretation  Date/Time:  Sunday September 19 2018 12:33:30 EDT Ventricular Rate:  70 PR Interval:    QRS Duration: 135 QT Interval:  371 QTC Calculation: 401 R Axis:   0 Text Interpretation:  Sinus rhythm Right bundle branch block Left ventricular hypertrophy Baseline wander in lead(s) V1 V4 Confirmed by Gerlene Fee [phone]) on 09/19/2018 3:09:12 PM   Radiology Dg Tibia/fibula Left  Result Date: 09/19/2018 CLINICAL DATA:  Left leg cellulitis. EXAM: LEFT TIBIA AND FIBULA - 2 VIEW; LEFT ANKLE COMPLETE - 3+ VIEW COMPARISON:  None. FINDINGS: No acute fracture or dislocation. Old trauma to the medial malleolus. Chronic deformity of the fifth metatarsal neck. The ankle mortise is symmetric. The  talar dome is  intact. No tibiotalar joint effusion. Mild tibiotalar and midfoot degenerative spurring. Moderate osteoarthritis of the left knee. Bone mineralization is normal. Large Achilles and plantar enthesophytes. Diffuse soft tissue swelling. IMPRESSION: 1. Diffuse soft tissue swelling.  No acute osseous abnormality. Electronically Signed   By: Titus Dubin M.D.   On: 09/19/2018 12:59   Dg Ankle Complete Left  Result Date: 09/19/2018 CLINICAL DATA:  Left leg cellulitis. EXAM: LEFT TIBIA AND FIBULA - 2 VIEW; LEFT ANKLE COMPLETE - 3+ VIEW COMPARISON:  None. FINDINGS: No acute fracture or dislocation. Old trauma to the medial malleolus. Chronic deformity of the fifth metatarsal neck. The ankle mortise is symmetric. The talar dome is intact. No tibiotalar joint effusion. Mild tibiotalar and midfoot degenerative spurring. Moderate osteoarthritis of the left knee. Bone mineralization is normal. Large Achilles and plantar enthesophytes. Diffuse soft tissue swelling. IMPRESSION: 1. Diffuse soft tissue swelling.  No acute osseous abnormality. Electronically Signed   By: Titus Dubin M.D.   On: 09/19/2018 12:59    Procedures Procedures (including critical care time)  Medications Ordered in ED Medications  hydrALAZINE (APRESOLINE) injection 10 mg (has no administration in time range)  lisinopril (ZESTRIL) tablet 10 mg (has no administration in time range)  metoprolol succinate (TOPROL-XL) 24 hr tablet 50 mg (has no administration in time range)  pravastatin (PRAVACHOL) tablet 20 mg (has no administration in time range)  triamterene-hydrochlorothiazide (MAXZIDE) 75-50 MG per tablet 1 tablet (has no administration in time range)  famotidine (PEPCID) tablet 10 mg (has no administration in time range)  albuterol (PROVENTIL) (2.5 MG/3ML) 0.083% nebulizer solution 2.5 mg (has no administration in time range)  fluticasone (FLONASE) 50 MCG/ACT nasal spray 1 spray (has no administration in time range)   acetaminophen (TYLENOL) tablet 650 mg (has no administration in time range)    Or  acetaminophen (TYLENOL) suppository 650 mg (has no administration in time range)  ondansetron (ZOFRAN) tablet 4 mg (has no administration in time range)    Or  ondansetron (ZOFRAN) injection 4 mg (has no administration in time range)  enoxaparin (LOVENOX) injection 40 mg (has no administration in time range)  HYDROcodone-acetaminophen (NORCO/VICODIN) 5-325 MG per tablet 1 tablet (has no administration in time range)  insulin aspart (novoLOG) injection 0-5 Units (has no administration in time range)  insulin aspart (novoLOG) injection 0-15 Units (has no administration in time range)  insulin glargine (LANTUS) injection 10 Units (has no administration in time range)  furosemide (LASIX) injection 20 mg (has no administration in time range)  piperacillin-tazobactam (ZOSYN) IVPB 3.375 g (0 g Intravenous Stopped 09/19/18 1354)  vancomycin (VANCOCIN) IVPB 1000 mg/200 mL premix (0 mg Intravenous Stopped 09/19/18 1419)     Initial Impression / Assessment and Plan / ED Course  I have reviewed the triage vital signs and the nursing notes.  Pertinent labs & imaging results that were available during my care of the patient were reviewed by me and considered in my medical decision making (see chart for details).  Pt is a 63 year old female with PMHx CHF, HTN, DM who presents to the ED today for left leg cellulitis; has failed 3 rounds of antibiotics in the past 3 months; most recently on clindamycin but states she had a reaction to it. Seen at wound clinic 4 days ago for same and had leg wrapped; drainage and foul smell noted yesterday; wound unwrapped in the ED; patient found to have 3 small maggots on bed underneath leg after unwrapping; leg is obviously warm to the  touch but patient has good peripheral pulse; patient is afebrile in the ED today; does not meet SIRS criteria currently; will obtain baseline labs including CBC,  CMP, lactic, U/A, wound cultures, and blood cultures. Xrays ordered of left tib fib and left ankle as well to rule out osteo. Zosyn and Vanc started given patient has failed 3 different antibiotics outpatient.   Xrays without signs of osteo; cbc without leukocytosis; lactic acid within normal limits; no electrolyte abnormalities; awaiting U/A. Dr. Sedonia Small attending physician evaluated patient as well; believes patient may need to be admitted for IV antibiotics. Will obtain covid swab today prior to admission.   Clinical Course as of Sep 19 1627  Sun Sep 19, 2018  1438 Discussed case with Hospitalist Dr. Tana Coast who will admit patient for IV abx   [MV]    Clinical Course User Index [MV] Eustaquio Maize, PA-C           Final Clinical Impressions(s) / ED Diagnoses   Final diagnoses:  Left leg cellulitis  Type 2 diabetes mellitus without complication, unspecified whether long term insulin use [facility])    ED Discharge Orders    None       Eustaquio Maize, PA-C 09/19/18 1629    Maudie Flakes, MD 09/22/18 [phone]

## 2018-09-19 NOTE — ED Notes (Signed)
ED Provider at bedside. 

## 2018-09-19 NOTE — ED Notes (Signed)
Patient transported to X-ray 

## 2018-09-20 ENCOUNTER — Inpatient Hospital Stay (HOSPITAL_COMMUNITY): Payer: Medicare Other

## 2018-09-20 DIAGNOSIS — I82402 Acute embolism and thrombosis of unspecified deep veins of left lower extremity: Secondary | ICD-10-CM

## 2018-09-20 LAB — CBC
HCT: 45.3 % (ref 36.0–46.0)
Hemoglobin: 14.6 g/dL (ref 12.0–15.0)
MCH: 28.6 pg (ref 26.0–34.0)
MCHC: 32.2 g/dL (ref 30.0–36.0)
MCV: 88.6 fL (ref 80.0–100.0)
Platelets: 227 10*3/uL (ref 150–400)
RBC: 5.11 MIL/uL (ref 3.87–5.11)
RDW: 13.5 % (ref 11.5–15.5)
WBC: 8.4 10*3/uL (ref 4.0–10.5)
nRBC: 0 % (ref 0.0–0.2)

## 2018-09-20 LAB — BASIC METABOLIC PANEL
Anion gap: 11 (ref 5–15)
BUN: 18 mg/dL (ref 8–23)
CO2: 26 mmol/L (ref 22–32)
Calcium: 9.1 mg/dL (ref 8.9–10.3)
Chloride: 98 mmol/L (ref 98–111)
Creatinine, Ser: 0.86 mg/dL (ref 0.44–1.00)
GFR calc Af Amer: >60 mL/min (ref >60)
GFR calc non Af Amer: >60 mL/min (ref >60)
Glucose, Bld: 217 mg/dL — ABNORMAL HIGH (ref 70–99)
Potassium: 3.6 mmol/L (ref 3.5–5.1)
Sodium: 135 mmol/L (ref 135–145)

## 2018-09-20 LAB — GLUCOSE, CAPILLARY
Glucose-Capillary: 206 mg/dL — ABNORMAL HIGH (ref 70–99)
Glucose-Capillary: 235 mg/dL — ABNORMAL HIGH (ref 70–99)

## 2018-09-20 MED ORDER — SODIUM CHLORIDE 0.9 % IV SOLN
INTRAVENOUS | Status: DC | PRN
  Administered 2018-09-20: 500 mL via INTRAVENOUS

## 2018-09-20 NOTE — Progress Notes (Signed)
Inpatient Diabetes Program Recommendations  AACE/ADA: New Consensus Statement on Inpatient Glycemic Control (2015)  Target Ranges:  Prepandial:   less than 140 mg/dL      Peak postprandial:   less than 180 mg/dL (1-2 hours)      Critically ill patients:  140 - 180 mg/dL   Results for NYEEMAH, JENNETTE (MRN [id number]) as of 09/20/2018 08:31  Ref. Range 09/19/2018 12:28 09/19/2018 21:43 09/20/2018 07:11  Glucose-Capillary Latest Ref Range: 70 - 99 mg/dL 227 (H) 181 (H)    10 units LANTUS 206 (H)  2 units NOVOLOG    Results for JASLENE, MARSTELLER (MRN [id number]) as of 09/20/2018 08:31  Ref. Range 02/20/2018 03:43 09/19/2018 16:48  Hemoglobin A1C Latest Ref Range: 4.8 - 5.6 % 7.8 (H)  (177 mg/dl) 9.4 (H)  (223 mg/dl)     Admit with: Left leg cellulitis outpatient failure to therapy  History: DM, CHF  Home DM Meds: Amaryl 2 mg Daily       Prandin 1 mg TID  Current Orders: Lantus 10 units QHS      Novolog Moderate Correction Scale/ SSI (0-15 units) TID AC + HS     PCP: Dr. Gaynelle Arabian with [facility]  Current A1c of 9.4% shows worsening CBG control at home since November of last year.  Note Lantus and Novolog both started last PMm as home oral DM meds are currently on hold.     --Will follow patient during hospitalization--  Wyn Quaker RN, MSN, CDE Diabetes Coordinator Inpatient Glycemic Control Team Team Pager: [phone] (8a-5p)

## 2018-09-20 NOTE — Consult Note (Signed)
[place] Nurse wound consult note Reason for Consult: cellulitis Wound type: cellulitis, partial thickness area on the posterior calf Pressure Injury POA: NA Wound bed: partial thickness skin loss, weeping minimally Drainage (amount, consistency, odor) serous  Periwound: intact, redness improved, skin changes consistent with venous insufficiency  Dressing procedure/placement/frequency: Single layer of xeroform over weeping area, kerlix and ACE wrap if needed with minimal stretch to hold dressings. Change daily to allow for assessment of cellulitis  Discussed POC with patient and bedside nurse.  Re consult if needed, will not follow at this time. Thanks  Emine Lopata [employer], RN,CWOCN, CNS, [place] [phone])

## 2018-09-20 NOTE — Progress Notes (Signed)
PROGRESS NOTE    Amanda Butler  YFV:[id number] DOB: [birth year] DOA: 09/19/2018 PCP: Gaynelle Arabian, MD   Brief Narrative: Patient is a 63 year old female with history of type 2 diabetes mellitus, hypertension, diastolic CHF, recurrent cellulitis of the left lower extremity, chronic bilateral lower extremity skin changes who presents to the emergency department with complains of left leg pain, redness and tenderness.  She follows with her PCP in wound clinic for the last 3 months for her left lower extremity cellulitis and has been on 3 rounds of different antibiotics including Augmentin, clindamycin and Levaquin.  She reported that she started having foul-smelling drainage from her dressing of the left lower extremity.  Wound care recommended her to come to the emergency department.  Started on IV antibiotics.  Wound care consulted.  Assessment & Plan:   Principal Problem:   Left leg cellulitis Active Problems:   Diabetes mellitus type 2, diet-controlled (HCC)   Obesity   GERD (gastroesophageal reflux disease)   Chronic diastolic CHF (congestive heart failure) (HCC)   Accelerated hypertension  Recurrent left leg cellulitis: Most likely exacerbated by bilateral venous stasis changes.  Failed outpatient antibiotic therapy.  Started on vancomycin and Zosyn which will continue.  Follow-up cultures.  We will follow-up venous duplex.  Also given Lasix 20 mg IV once.  Wound care consulted.  Hypertension: Hypotensive on presentation.  Currently blood pressures acceptable.  Continue current medicines  Type 2 DM:  hemoglobin A1c of 9.4.  Follows with her PCP.  Continue sliding scale and Lantus here.  Bilateral chronic venous stasis changes/skin thickening/segmentation/long toenails: Needs to follow-up with podiatry as an outpatient.  Obesity: Counseled on diet and weight control.  GERD: Continue famotidine  Chronic diastolic CHF: Echo in 1638 showed ejection fraction of 55 to 60%, grade 2  diastolic dysfunction.  On triamterene-hydrochlorothiazide at home.         DVT prophylaxis: Lovenox Code Status: Full Family Communication: None present at the bedside Disposition Plan: Home in 1 to 2 days   Consultants: None  Procedures: None  Antimicrobials:  Anti-infectives (From admission, onward)   Start     Dose/Rate Route Frequency Ordered Stop   09/20/18 1600  vancomycin (VANCOCIN) 1,750 mg in sodium chloride 0.9 % 500 mL IVPB     1,750 mg 250 mL/hr over 120 Minutes Intravenous Every 24 hours 09/19/18 2031     09/19/18 2100  piperacillin-tazobactam (ZOSYN) IVPB 3.375 g     3.375 g 12.5 mL/hr over 240 Minutes Intravenous Every 8 hours 09/19/18 2016     09/19/18 2030  vancomycin (VANCOCIN) 1,500 mg in sodium chloride 0.9 % 500 mL IVPB     1,500 mg 250 mL/hr over 120 Minutes Intravenous STAT 09/19/18 2017 09/19/18 2355   09/19/18 1300  vancomycin (VANCOCIN) IVPB 1000 mg/200 mL premix     1,000 mg 200 mL/hr over 60 Minutes Intravenous  Once 09/19/18 1249 09/19/18 1419   09/19/18 1245  piperacillin-tazobactam (ZOSYN) IVPB 3.375 g     3.375 g 100 mL/hr over 30 Minutes Intravenous  Once 09/19/18 1240 09/19/18 1354      Subjective: Patient seen and examined the bedside this morning.  Hemodynamically stable.  Comfortable.  Left lower extremity cellulitis might have improved since yesterday.  Less drainage and less swelling  Objective: Vitals:   09/19/18 1640 09/19/18 1645 09/19/18 2112 09/20/18 0612  BP: (!) 186/75  138/64 138/81  Pulse: 62  63 68  Resp: 14  18 18   Temp: 98 F (36.7  C)  98.3 F (36.8 C) 98.1 F (36.7 C)  TempSrc: Oral  Oral Oral  SpO2: 100%  99% 98%  Weight:  122.2 kg    Height:  5\' 6"  (1.676 m)      Intake/Output Summary (Last 24 hours) at 09/20/2018 1228 Last data filed at 09/20/2018 1031 Gross per 24 hour  Intake 1188.55 ml  Output 2801 ml  Net -1612.45 ml   Filed Weights   09/19/18 0908 09/19/18 1645  Weight: 122.5 kg 122.2 kg     Examination:  General exam: Appears calm and comfortable ,Not in distress,obese HEENT:PERRL,Oral mucosa moist, Ear/Nose normal on gross exam Respiratory system: Bilateral equal air entry, normal vesicular breath sounds, no wheezes or crackles  Cardiovascular system: S1 & S2 heard, RRR. No JVD, murmurs, rubs, gallops or clicks. No pedal edema. Gastrointestinal system: Abdomen is nondistended, soft and nontender. No organomegaly or masses felt. Normal bowel sounds heard. Central nervous system: Alert and oriented. No focal neurological deficits. Extremities: Trace edema of bilateral lower extremities, no clubbing ,no cyanosis, distal peripheral pulses palpable. Skin: Chronic skin changes on bilateral lower extremities, left lower extremity erythematous, edematous, shallow ulcer on the posterior side of the left leg      Data Reviewed: I have personally reviewed following labs and imaging studies  CBC: Recent Labs  Lab 09/19/18 1225 09/20/18 0333  WBC 7.3 8.4  NEUTROABS 5.4  --   HGB 14.9 14.6  HCT 46.3* 45.3  MCV 89.2 88.6  PLT 210 242   Basic Metabolic Panel: Recent Labs  Lab 09/19/18 1225 09/20/18 0333  NA 136 135  K 3.6 3.6  CL 101 98  CO2 26 26  GLUCOSE 248* 217*  BUN 19 18  CREATININE 0.85 0.86  CALCIUM 9.1 9.1   GFR: Estimated Creatinine Clearance: 87 mL/min (by C-G formula based on SCr of 0.86 mg/dL). Liver Function Tests: Recent Labs  Lab 09/19/18 1225  AST 15  ALT 20  ALKPHOS 73  BILITOT 0.8  PROT 7.6  ALBUMIN 3.7   No results for input(s): LIPASE, AMYLASE in the last 168 hours. No results for input(s): AMMONIA in the last 168 hours. Coagulation Profile: No results for input(s): INR, PROTIME in the last 168 hours. Cardiac Enzymes: No results for input(s): CKTOTAL, CKMB, CKMBINDEX, TROPONINI in the last 168 hours. BNP (last 3 results) No results for input(s): PROBNP in the last 8760 hours. HbA1C: Recent Labs    09/19/18 1648  HGBA1C 9.4*    CBG: Recent Labs  Lab 09/19/18 1228 09/19/18 2143 09/20/18 0711 09/20/18 1138  GLUCAP 227* 181* 206* 235*   Lipid Profile: No results for input(s): CHOL, HDL, LDLCALC, TRIG, CHOLHDL, LDLDIRECT in the last 72 hours. Thyroid Function Tests: No results for input(s): TSH, T4TOTAL, FREET4, T3FREE, THYROIDAB in the last 72 hours. Anemia Panel: No results for input(s): VITAMINB12, FOLATE, FERRITIN, TIBC, IRON, RETICCTPCT in the last 72 hours. Sepsis Labs: Recent Labs  Lab 09/19/18 1225  LATICACIDVEN 1.6    Recent Results (from the past 240 hour(s))  Wound or Superficial Culture     Status: None (Preliminary result)   Collection Time: 09/19/18 12:25 PM  Result Value Ref Range Status   Specimen Description   Final    WOUND Performed at [facility], [place] [address] [place], [place] [address]    Special Requests   Final    LEFT LEG Performed at [facility], [place] [address], [place], [place] [address]    Gram  Stain   Final    NO WBC SEEN FEW GRAM POSITIVE COCCI RARE GRAM POSITIVE RODS    Culture   Final    TOO YOUNG TO READ Performed at [facility], [place] [address] [place], [place] [address]    Report Status PENDING  Incomplete  Culture, blood (routine x 2)     Status: None (Preliminary result)   Collection Time: 09/19/18 12:25 PM  Result Value Ref Range Status   Specimen Description   Final    BLOOD RIGHT HAND Performed at [facility] [address] [place], [place] [address]    Special Requests   Final    BOTTLES DRAWN AEROBIC AND ANAEROBIC Blood Culture results may not be optimal due to an inadequate volume of blood received in culture bottles Performed at [facility] [address] [place], [place] [address]    Culture   Final    NO GROWTH < 24 HOURS Performed at [facility] [address] [place], [place] [address]    Report Status PENDING  Incomplete  Culture,  blood (routine x 2)     Status: None (Preliminary result)   Collection Time: 09/19/18 12:25 PM  Result Value Ref Range Status   Specimen Description   Final    BLOOD RIGHT ANTECUBITAL Performed at [facility], [place] [address], [place], [place] [address]    Special Requests   Final    BOTTLES DRAWN AEROBIC AND ANAEROBIC Blood Culture adequate volume Performed at [facility] [address] [place], [place] [address]    Culture   Final    NO GROWTH < 24 HOURS Performed at [facility] [address] [place], [place] [address]    Report Status PENDING  Incomplete  SARS Coronavirus 2 (CEPHEID - Performed in [place] hospital lab), Hosp Order     Status: None   Collection Time: 09/19/18 12:39 PM  Result Value Ref Range Status   SARS Coronavirus 2 NEGATIVE NEGATIVE Final    Comment: (NOTE) If result is NEGATIVE SARS-CoV-2 target nucleic acids are NOT DETECTED. The SARS-CoV-2 RNA is generally detectable in upper and lower  respiratory specimens during the acute phase of infection. The lowest  concentration of SARS-CoV-2 viral copies this assay can detect is 250  copies / mL. A negative result does not preclude SARS-CoV-2 infection  and should not be used as the sole basis for treatment or other  patient management decisions.  A negative result may occur with  improper specimen collection / handling, submission of specimen other  than nasopharyngeal swab, presence of viral mutation(s) within the  areas targeted by this assay, and inadequate number of viral copies  (<250 copies / mL). A negative result must be combined with clinical  observations, patient history, and epidemiological information. If result is POSITIVE SARS-CoV-2 target nucleic acids are DETECTED. The SARS-CoV-2 RNA is generally detectable in upper and lower  respiratory specimens dur ing the acute phase of infection.  Positive  results are indicative of active infection with  SARS-CoV-2.  Clinical  correlation with patient history and other diagnostic information is  necessary to determine patient infection status.  Positive results do  not rule out bacterial infection or co-infection with other viruses. If result is PRESUMPTIVE POSTIVE SARS-CoV-2 nucleic acids MAY BE PRESENT.   A presumptive positive result was obtained on the submitted specimen  and confirmed on repeat testing.  While 2019 novel  coronavirus  (SARS-CoV-2) nucleic acids may be present in the submitted sample  additional confirmatory testing may be necessary for epidemiological  and / or clinical management purposes  to differentiate between  SARS-CoV-2 and other Sarbecovirus currently known to infect humans.  If clinically indicated additional testing with an alternate test  methodology [phone]) is advised. The SARS-CoV-2 RNA is generally  detectable in upper and lower respiratory sp ecimens during the acute  phase of infection. The expected result is Negative. Fact Sheet for Patients:  StrictlyIdeas.no Fact Sheet for Healthcare Providers: BankingDealers.co.za This test is not yet approved or cleared by the [place] FDA and has been authorized for detection and/or diagnosis of SARS-CoV-2 by FDA under an Emergency Use Authorization (EUA).  This EUA will remain in effect (meaning this test can be used) for the duration of the COVID-19 declaration under Section 564(b)(1) of the Act, 21 U.S.C. section 360bbb-3(b)(1), unless the authorization is terminated or revoked sooner. Performed at [facility], [place] [address] [place], [place] [address]          Radiology Studies: Dg Tibia/fibula Left  Result Date: 09/19/2018 CLINICAL DATA:  Left leg cellulitis. EXAM: LEFT TIBIA AND FIBULA - 2 VIEW; LEFT ANKLE COMPLETE - 3+ VIEW COMPARISON:  None. FINDINGS: No acute fracture or dislocation. Old trauma to the medial malleolus.  Chronic deformity of the fifth metatarsal neck. The ankle mortise is symmetric. The talar dome is intact. No tibiotalar joint effusion. Mild tibiotalar and midfoot degenerative spurring. Moderate osteoarthritis of the left knee. Bone mineralization is normal. Large Achilles and plantar enthesophytes. Diffuse soft tissue swelling. IMPRESSION: 1. Diffuse soft tissue swelling.  No acute osseous abnormality. Electronically Signed   By: Titus Dubin M.D.   On: 09/19/2018 12:59   Dg Ankle Complete Left  Result Date: 09/19/2018 CLINICAL DATA:  Left leg cellulitis. EXAM: LEFT TIBIA AND FIBULA - 2 VIEW; LEFT ANKLE COMPLETE - 3+ VIEW COMPARISON:  None. FINDINGS: No acute fracture or dislocation. Old trauma to the medial malleolus. Chronic deformity of the fifth metatarsal neck. The ankle mortise is symmetric. The talar dome is intact. No tibiotalar joint effusion. Mild tibiotalar and midfoot degenerative spurring. Moderate osteoarthritis of the left knee. Bone mineralization is normal. Large Achilles and plantar enthesophytes. Diffuse soft tissue swelling. IMPRESSION: 1. Diffuse soft tissue swelling.  No acute osseous abnormality. Electronically Signed   By: Titus Dubin M.D.   On: 09/19/2018 12:59        Scheduled Meds: . enoxaparin (LOVENOX) injection  60 mg Subcutaneous Q24H  . fluticasone  1 spray Each Nare Daily  . insulin aspart  0-15 Units Subcutaneous TID WC  . insulin aspart  0-5 Units Subcutaneous QHS  . insulin glargine  10 Units Subcutaneous QHS  . lisinopril  10 mg Oral Daily  . metoprolol succinate  50 mg Oral Daily  . pravastatin  20 mg Oral Daily  . triamterene-hydrochlorothiazide  1 tablet Oral Daily   Continuous Infusions: . piperacillin-tazobactam (ZOSYN)  IV 3.375 g (09/20/18 0507)  . vancomycin       LOS: 1 day    Time spent: 35 mins.More than 50% of that time was spent in counseling and/or coordination of care.      Shelly Coss, MD [facility] Pager  [phone]  If 7PM-7AM, please contact night-coverage [email or web address] Password [facility] 09/20/2018, 12:28 PM

## 2018-09-20 NOTE — Progress Notes (Signed)
Results for Amanda Butler, Amanda Butler (MRN [id number]) as of 09/20/2018 08:31  Ref. Range 02/20/2018 03:43 09/19/2018 16:48  Hemoglobin A1C Latest Ref Range: 4.8 - 5.6 % 7.8 (H)  (177 mg/dl) 9.4 (H)  (223 mg/dl)   Admit with: Left leg cellulitisoutpatient failure to therapy  History: DM, CHF  Home DM Meds: Amaryl 2 mg Daily                             Prandin 1 mg TID  Current Orders: Lantus 10 units QHS                            Novolog Moderate Correction Scale/ SSI (0-15 units) TID AC + HS   PCP: Dr. Gaynelle Arabian with [facility] with patient about her current A1c of 9.4%.  Explained what an A1c is and what it measures.  Reminded patient that her goal A1c is 7% or less per ADA standards to prevent both acute and long-term complications.  Explained to patient the extreme importance of good glucose control at home.  Encouraged patient to check her CBGs at least bid at home (fasting and another check within the day) and to record all CBGs in a logbook for her PCP to review.   Patient told me she has not been eating well since the pandemic started.  On a fixed income and has a hard time standing to prepare her food.  Often goes through fast food drive thru for meals.  Stated to me that she has been making poor food choices lately and knows that her food choices, lack of exercise, and her LE infection are making her CBG control worse.  Stated to me she has had DM a long time and knows what to eat, however, her current situation makes it hard for her to eat well.  Plans to try to make better choices after discharge.  Pt told me she has a CBG meter but only checking daily (if she remembers).  Encouraged more frequent CBG checks at home and reviewed CBG goals for home as well.  Pt stated she needs a new CBG meter and plans to purchase one OTC after d/c.  Pt told me her PCP had stated he wanted to recheck her A1c at her next visit in August and pt also stated her PCP discussed the  possibility of starting insulin at home, however, pt says she can't take insulin right now b/c her fridge is broken and she can't afford a new one at this time.  Food does not stay cold in the fridge and spoils quickly.  Stated she tried to buy a small dorm fridge from [place] but it never came and she got a refund.  Also stated to me she has no one to help her get a dorm sized fridge in her house and get it set up (lives alone).  Her PCP recently switched her from Januvia once daily to Prandin TID for affordability issues (pt on fixed income with Medicare and the Januvia was costing her >$200 per month).  Plan to follow up with Dr. Marisue Humble (PCP) in August.    --Will follow patient during hospitalization--  Wyn Quaker RN, MSN, CDE Diabetes Coordinator Inpatient Glycemic Control Team Team Pager: [phone] (8a-5p)

## 2018-09-20 NOTE — Progress Notes (Signed)
VASCULAR LAB PRELIMINARY  PRELIMINARY  PRELIMINARY  PRELIMINARY  Left lower extremity venous duplex completed.    Preliminary report:  See CV proc for preliminary results.   Krissa Utke, RVT 09/20/2018, 1:36 PM

## 2018-09-20 NOTE — Plan of Care (Signed)
  Problem: Health Behavior/Discharge Planning: Goal: Ability to manage health-related needs will improve Outcome: Progressing   Problem: Clinical Measurements: Goal: Ability to maintain clinical measurements within normal limits will improve Outcome: Progressing Goal: Will remain free from infection Outcome: Progressing Goal: Diagnostic test results will improve Outcome: Progressing Goal: Respiratory complications will improve Outcome: Progressing Goal: Cardiovascular complication will be avoided Outcome: Progressing   Problem: Activity: Goal: Risk for activity intolerance will decrease Outcome: Progressing   Problem: Pain Managment: Goal: General experience of comfort will improve Outcome: Progressing   Problem: Safety: Goal: Ability to remain free from injury will improve Outcome: Progressing   Problem: Skin Integrity: Goal: Risk for impaired skin integrity will decrease Outcome: Progressing   Problem: Clinical Measurements: Goal: Ability to avoid or minimize complications of infection will improve Outcome: Progressing   Problem: Skin Integrity: Goal: Skin integrity will improve Outcome: Progressing   Problem: Education: Goal: Knowledge of General Education information will improve Description Including pain rating scale, medication(s)/side effects and non-pharmacologic comfort measures Outcome: Progressing  Plan of care discussed with patient today.

## 2018-09-21 LAB — URINE CULTURE: Culture: 10000 — AB

## 2018-09-21 LAB — GLUCOSE, CAPILLARY
Glucose-Capillary: 168 mg/dL — ABNORMAL HIGH (ref 70–99)
Glucose-Capillary: 261 mg/dL — ABNORMAL HIGH (ref 70–99)
Glucose-Capillary: 229 mg/dL — ABNORMAL HIGH (ref 70–99)
Glucose-Capillary: 270 mg/dL — ABNORMAL HIGH (ref 70–99)

## 2018-09-21 LAB — CREATININE, SERUM
Creatinine, Ser: 1.05 mg/dL — ABNORMAL HIGH (ref 0.44–1.00)
GFR calc Af Amer: >60 mL/min (ref >60)
GFR calc non Af Amer: 56 mL/min — ABNORMAL LOW (ref >60)

## 2018-09-21 MED ORDER — INSULIN GLARGINE 100 UNIT/ML ~~LOC~~ SOLN
20.0000 [IU] | Freq: Every day | SUBCUTANEOUS | Status: DC
  Administered 2018-09-21: 20 [IU] via SUBCUTANEOUS
  Filled 2018-09-21 (×2): qty 0.2

## 2018-09-21 NOTE — Progress Notes (Signed)
PROGRESS NOTE    Amanda Butler  ALP:[id number] DOB: 1955-05-09 DOA: 09/19/2018 PCP: Gaynelle Arabian, MD   Brief Narrative: Patient is a 63 year old female with history of type 2 diabetes mellitus, hypertension, diastolic CHF, recurrent cellulitis of the left lower extremity, chronic bilateral lower extremity skin changes who presents to the emergency department with complains of left leg pain, redness and tenderness.  She follows with her PCP in wound clinic for the last 3 months for her left lower extremity cellulitis and has been on 3 rounds of different antibiotics including Augmentin, clindamycin and Levaquin.  She reported that she started having foul-smelling drainage from her dressing of the left lower extremity.  Wound care recommended her to come to the emergency department.  Started on IV antibiotics.  Wound care consulted.  Assessment & Plan:   Principal Problem:   Left leg cellulitis Active Problems:   Diabetes mellitus type 2, diet-controlled (HCC)   Obesity   GERD (gastroesophageal reflux disease)   Chronic diastolic CHF (congestive heart failure) (HCC)   Accelerated hypertension  Recurrent left leg cellulitis: Most likely exacerbated by bilateral venous stasis changes.  Failed outpatient antibiotic therapy.  Started on vancomycin and Zosyn which will continue.  Follow-up cultures. No DVT as per  venous duplex.  Also given Lasix 20 mg IV once.  Wound care consulted. Cellulitis looks  much better today. She needs to follow-up with wound care as an outpatient.  Hypertension: Hypertensive on presentation.  Currently blood pressures acceptable.  Continue current medicines  Type 2 DM:  hemoglobin A1c of 9.4.  Follows with her PCP.  Continue sliding scale and Lantus here.  Bilateral chronic venous stasis changes/skin thickening/segmentation/long toenails: Needs to follow-up with podiatry as an outpatient.  Obesity: Counseled on diet and weight control.  GERD: Continue  famotidine  Chronic diastolic CHF: Echo in 3532 showed ejection fraction of 55 to 99%, grade 2 diastolic dysfunction.  On triamterene-hydrochlorothiazide at home.         DVT prophylaxis: Lovenox Code Status: Full Family Communication: None present at the bedside Disposition Plan: Home tomorrow   Consultants: None  Procedures: None  Antimicrobials:  Anti-infectives (From admission, onward)   Start     Dose/Rate Route Frequency Ordered Stop   09/20/18 1600  vancomycin (VANCOCIN) 1,750 mg in sodium chloride 0.9 % 500 mL IVPB     1,750 mg 250 mL/hr over 120 Minutes Intravenous Every 24 hours 09/19/18 2031     09/19/18 2100  piperacillin-tazobactam (ZOSYN) IVPB 3.375 g     3.375 g 12.5 mL/hr over 240 Minutes Intravenous Every 8 hours 09/19/18 2016     09/19/18 2030  vancomycin (VANCOCIN) 1,500 mg in sodium chloride 0.9 % 500 mL IVPB     1,500 mg 250 mL/hr over 120 Minutes Intravenous STAT 09/19/18 2017 09/19/18 2355   09/19/18 1300  vancomycin (VANCOCIN) IVPB 1000 mg/200 mL premix     1,000 mg 200 mL/hr over 60 Minutes Intravenous  Once 09/19/18 1249 09/19/18 1419   09/19/18 1245  piperacillin-tazobactam (ZOSYN) IVPB 3.375 g     3.375 g 100 mL/hr over 30 Minutes Intravenous  Once 09/19/18 1240 09/19/18 1354      Subjective: Patient seen and examined the bedside this morning.  Hemodynamically stable.  Left lower extremity wrapped with dressing.  Edema looks better today.  She feels better Objective: Vitals:   09/20/18 0612 09/20/18 1359 09/20/18 2113 09/21/18 0553  BP: 138/81 126/65 122/66 116/66  Pulse: 68 (!) 52 (!) 59 Marland Kitchen)  58  Resp: 18 16 18 18   Temp: 98.1 F (36.7 C) 98.2 F (36.8 C) 98.7 F (37.1 C) 97.7 F (36.5 C)  TempSrc: Oral Oral Oral Oral  SpO2: 98% 99% 95% 98%  Weight:      Height:        Intake/Output Summary (Last 24 hours) at 09/21/2018 1143 Last data filed at 09/21/2018 1000 Gross per 24 hour  Intake 820 ml  Output 2700 ml  Net -1880 ml   Filed  Weights   09/19/18 0908 09/19/18 1645  Weight: 122.5 kg 122.2 kg    Examination:  General exam: Appears calm and comfortable ,Not in distress,obese HEENT:PERRL,Oral mucosa moist, Ear/Nose normal on gross exam Respiratory system: Bilateral equal air entry, normal vesicular breath sounds, no wheezes or crackles  Cardiovascular system: S1 & S2 heard, RRR. No JVD, murmurs, rubs, gallops or clicks. Gastrointestinal system: Abdomen is nondistended, soft and nontender. No organomegaly or masses felt. Normal bowel sounds heard. Central nervous system: Alert and oriented. No focal neurological deficits. Extremities: No edema, no clubbing ,no cyanosis, distal peripheral pulses palpable. Skin: Left lower extremity wrapped with dressing, no edema on the right lower extremity   Data Reviewed: I have personally reviewed following labs and imaging studies  CBC: Recent Labs  Lab 09/19/18 1225 09/20/18 0333  WBC 7.3 8.4  NEUTROABS 5.4  --   HGB 14.9 14.6  HCT 46.3* 45.3  MCV 89.2 88.6  PLT 210 989   Basic Metabolic Panel: Recent Labs  Lab 09/19/18 1225 09/20/18 0333 09/21/18 0420  NA 136 135  --   K 3.6 3.6  --   CL 101 98  --   CO2 26 26  --   GLUCOSE 248* 217*  --   BUN 19 18  --   CREATININE 0.85 0.86 1.05*  CALCIUM 9.1 9.1  --    GFR: Estimated Creatinine Clearance: 71.3 mL/min (A) (by C-G formula based on SCr of 1.05 mg/dL (H)). Liver Function Tests: Recent Labs  Lab 09/19/18 1225  AST 15  ALT 20  ALKPHOS 73  BILITOT 0.8  PROT 7.6  ALBUMIN 3.7   No results for input(s): LIPASE, AMYLASE in the last 168 hours. No results for input(s): AMMONIA in the last 168 hours. Coagulation Profile: No results for input(s): INR, PROTIME in the last 168 hours. Cardiac Enzymes: No results for input(s): CKTOTAL, CKMB, CKMBINDEX, TROPONINI in the last 168 hours. BNP (last 3 results) No results for input(s): PROBNP in the last 8760 hours. HbA1C: Recent Labs    09/19/18 1648    HGBA1C 9.4*   CBG: Recent Labs  Lab 09/20/18 0711 09/20/18 1138 09/20/18 1636 09/20/18 2155 09/21/18 0716  GLUCAP 206* 235* 168* 261* 229*   Lipid Profile: No results for input(s): CHOL, HDL, LDLCALC, TRIG, CHOLHDL, LDLDIRECT in the last 72 hours. Thyroid Function Tests: No results for input(s): TSH, T4TOTAL, FREET4, T3FREE, THYROIDAB in the last 72 hours. Anemia Panel: No results for input(s): VITAMINB12, FOLATE, FERRITIN, TIBC, IRON, RETICCTPCT in the last 72 hours. Sepsis Labs: Recent Labs  Lab 09/19/18 1225  LATICACIDVEN 1.6    Recent Results (from the past 240 hour(s))  Wound or Superficial Culture     Status: None (Preliminary result)   Collection Time: 09/19/18 12:25 PM  Result Value Ref Range Status   Specimen Description   Final    WOUND Performed at [facility], [place] [address], [place], [place] [address]    Special Requests   Final  LEFT LEG Performed at [facility], [place] [address] [place], [place] [address]    Gram Stain   Final    NO WBC SEEN FEW GRAM POSITIVE COCCI RARE GRAM POSITIVE RODS    Culture   Final    TOO YOUNG TO READ Performed at [facility], [place] [address], [place], [place] [address]    Report Status PENDING  Incomplete  Culture, blood (routine x 2)     Status: None (Preliminary result)   Collection Time: 09/19/18 12:25 PM  Result Value Ref Range Status   Specimen Description   Final    BLOOD RIGHT HAND Performed at [place] [address], [place], [place] [address]    Special Requests   Final    BOTTLES DRAWN AEROBIC AND ANAEROBIC Blood Culture results may not be optimal due to an inadequate volume of blood received in culture bottles Performed at [facility] [address], [place], [place] [address]    Culture   Final    NO GROWTH < 24 HOURS Performed at [place] [address], [place], [place] [address]    Report  Status PENDING  Incomplete  Culture, blood (routine x 2)     Status: None (Preliminary result)   Collection Time: 09/19/18 12:25 PM  Result Value Ref Range Status   Specimen Description   Final    BLOOD RIGHT ANTECUBITAL Performed at [facility], [place] [address] [place], [place] [address]    Special Requests   Final    BOTTLES DRAWN AEROBIC AND ANAEROBIC Blood Culture adequate volume Performed at [facility] [address] [place], [place] [address]    Culture   Final    NO GROWTH < 24 HOURS Performed at [facility] [address], [place], [place] [address]    Report Status PENDING  Incomplete  SARS Coronavirus 2 (CEPHEID - Performed in [place] hospital lab), Hosp Order     Status: None   Collection Time: 09/19/18 12:39 PM  Result Value Ref Range Status   SARS Coronavirus 2 NEGATIVE NEGATIVE Final    Comment: (NOTE) If result is NEGATIVE SARS-CoV-2 target nucleic acids are NOT DETECTED. The SARS-CoV-2 RNA is generally detectable in upper and lower  respiratory specimens during the acute phase of infection. The lowest  concentration of SARS-CoV-2 viral copies this assay can detect is 250  copies / mL. A negative result does not preclude SARS-CoV-2 infection  and should not be used as the sole basis for treatment or other  patient management decisions.  A negative result may occur with  improper specimen collection / handling, submission of specimen other  than nasopharyngeal swab, presence of viral mutation(s) within the  areas targeted by this assay, and inadequate number of viral copies  (<250 copies / mL). A negative result must be combined with clinical  observations, patient history, and epidemiological information. If result is POSITIVE SARS-CoV-2 target nucleic acids are DETECTED. The SARS-CoV-2 RNA is generally detectable in upper and lower  respiratory specimens dur ing the acute phase of infection.  Positive  results are  indicative of active infection with SARS-CoV-2.  Clinical  correlation with patient history and other diagnostic information is  necessary to determine patient infection status.  Positive results do  not rule out bacterial infection or co-infection with other viruses. If result is PRESUMPTIVE POSTIVE SARS-CoV-2 nucleic acids MAY BE PRESENT.   A  presumptive positive result was obtained on the submitted specimen  and confirmed on repeat testing.  While 2019 novel coronavirus  (SARS-CoV-2) nucleic acids may be present in the submitted sample  additional confirmatory testing may be necessary for epidemiological  and / or clinical management purposes  to differentiate between  SARS-CoV-2 and other Sarbecovirus currently known to infect humans.  If clinically indicated additional testing with an alternate test  methodology [id number]) is advised. The SARS-CoV-2 RNA is generally  detectable in upper and lower respiratory sp ecimens during the acute  phase of infection. The expected result is Negative. Fact Sheet for Patients:  StrictlyIdeas.no Fact Sheet for Healthcare Providers: BankingDealers.co.za This test is not yet approved or cleared by the [place] FDA and has been authorized for detection and/or diagnosis of SARS-CoV-2 by FDA under an Emergency Use Authorization (EUA).  This EUA will remain in effect (meaning this test can be used) for the duration of the COVID-19 declaration under Section 564(b)(1) of the Act, 21 U.S.C. section 360bbb-3(b)(1), unless the authorization is terminated or revoked sooner. Performed at [facility], [place] [address] [place], [place] [address]   Urine culture     Status: None (Preliminary result)   Collection Time: 09/19/18  9:19 PM  Result Value Ref Range Status   Specimen Description   Final    URINE, RANDOM Performed at [facility] [address]  [place], [place] [address]    Special Requests   Final    NONE Performed at [facility], [place] [address], [place], [place] [address]    Culture   Final    CULTURE REINCUBATED FOR BETTER GROWTH Performed at [facility], [place] [address], [place], [place] [address]    Report Status PENDING  Incomplete         Radiology Studies: Dg Tibia/fibula Left  Result Date: 09/19/2018 CLINICAL DATA:  Left leg cellulitis. EXAM: LEFT TIBIA AND FIBULA - 2 VIEW; LEFT ANKLE COMPLETE - 3+ VIEW COMPARISON:  None. FINDINGS: No acute fracture or dislocation. Old trauma to the medial malleolus. Chronic deformity of the fifth metatarsal neck. The ankle mortise is symmetric. The talar dome is intact. No tibiotalar joint effusion. Mild tibiotalar and midfoot degenerative spurring. Moderate osteoarthritis of the left knee. Bone mineralization is normal. Large Achilles and plantar enthesophytes. Diffuse soft tissue swelling. IMPRESSION: 1. Diffuse soft tissue swelling.  No acute osseous abnormality. Electronically Signed   By: Titus Dubin M.D.   On: 09/19/2018 12:59   Dg Ankle Complete Left  Result Date: 09/19/2018 CLINICAL DATA:  Left leg cellulitis. EXAM: LEFT TIBIA AND FIBULA - 2 VIEW; LEFT ANKLE COMPLETE - 3+ VIEW COMPARISON:  None. FINDINGS: No acute fracture or dislocation. Old trauma to the medial malleolus. Chronic deformity of the fifth metatarsal neck. The ankle mortise is symmetric. The talar dome is intact. No tibiotalar joint effusion. Mild tibiotalar and midfoot degenerative spurring. Moderate osteoarthritis of the left knee. Bone mineralization is normal. Large Achilles and plantar enthesophytes. Diffuse soft tissue swelling. IMPRESSION: 1. Diffuse soft tissue swelling.  No acute osseous abnormality. Electronically Signed   By: Titus Dubin M.D.   On: 09/19/2018 12:59   Vas [place] Lower Extremity Venous (dvt)  Result Date: 09/20/2018  Lower Venous Study Indications: Swelling.   Limitations: Body habitus and skin texture. Comparison Study: Prior study from 02/20/18 is available for comparison Performing Technologist: Sharion Dove RVS  Examination Guidelines: A complete evaluation includes B-mode imaging, spectral Doppler,  color Doppler, and power Doppler as needed of all accessible portions of each vessel. Bilateral testing is considered an integral part of a complete examination. Limited examinations for reoccurring indications may be performed as noted.  Right Technical Findings: Right leg not evaluated.  +---------+---------------+---------+-----------+----------+--------------+  LEFT      Compressibility Phasicity Spontaneity Properties Summary         +---------+---------------+---------+-----------+----------+--------------+  CFV                                                        Not visualized  +---------+---------------+---------+-----------+----------+--------------+  SFJ                                                        Not visualized  +---------+---------------+---------+-----------+----------+--------------+  FV Prox   Full                                                             +---------+---------------+---------+-----------+----------+--------------+  FV Mid    Full                                                             +---------+---------------+---------+-----------+----------+--------------+  FV Distal Full                                                             +---------+---------------+---------+-----------+----------+--------------+  PFV       Full                                                             +---------+---------------+---------+-----------+----------+--------------+  POP       Full                                                             +---------+---------------+---------+-----------+----------+--------------+  PTV       Full                                                              +---------+---------------+---------+-----------+----------+--------------+  PERO      Full                                                             +---------+---------------+---------+-----------+----------+--------------+     Summary: Left: There is no evidence of deep vein thrombosis in the lower extremity. However, portions of this examination were limited- see technologist comments above.  *See table(s) above for measurements and observations. Electronically signed by Monica Martinez MD on 09/20/2018 at 4:19:08 PM.    Final         Scheduled Meds:  enoxaparin (LOVENOX) injection  60 mg Subcutaneous Q24H   fluticasone  1 spray Each Nare Daily   insulin aspart  0-15 Units Subcutaneous TID WC   insulin aspart  0-5 Units Subcutaneous QHS   insulin glargine  10 Units Subcutaneous QHS   lisinopril  10 mg Oral Daily   metoprolol succinate  50 mg Oral Daily   pravastatin  20 mg Oral Daily   triamterene-hydrochlorothiazide  1 tablet Oral Daily   Continuous Infusions:  sodium chloride 500 mL (09/20/18 2102)   piperacillin-tazobactam (ZOSYN)  IV 3.375 g (09/21/18 0513)   vancomycin 1,750 mg (09/20/18 1810)     LOS: 2 days    Time spent: 35 mins.More than 50% of that time was spent in counseling and/or coordination of care.      Shelly Coss, MD [facility] Pager [phone]  If 7PM-7AM, please contact night-coverage [email or web address] Password [email or web address] 09/21/2018, 11:43 AM

## 2018-09-22 LAB — CREATININE, SERUM
Creatinine, Ser: 1.12 mg/dL — ABNORMAL HIGH (ref 0.44–1.00)
GFR calc Af Amer: 60 mL/min — ABNORMAL LOW (ref >60)
GFR calc non Af Amer: 52 mL/min — ABNORMAL LOW (ref >60)

## 2018-09-22 LAB — AEROBIC CULTURE W GRAM STAIN (SUPERFICIAL SPECIMEN): Gram Stain: NONE SEEN

## 2018-09-22 LAB — GLUCOSE, CAPILLARY: Glucose-Capillary: 250 mg/dL — ABNORMAL HIGH (ref 70–99)

## 2018-09-22 MED ORDER — SULFAMETHOXAZOLE-TRIMETHOPRIM 800-160 MG PO TABS: 1.0000 | ORAL_TABLET | Freq: Two times a day (BID) | ORAL | 0 refills | Status: AC

## 2018-09-22 NOTE — Progress Notes (Signed)
Writer gave discharge instructions to patient. Patient had no questions. NT or writer will wheel patient out to her car once she is ready to go

## 2018-09-22 NOTE — TOC Transition Note (Signed)
Transition of Care [facility]) - CM/SW Discharge Note   Patient Details  Name: MIA MILAN MRN: [id number] Date of Birth: 1955-06-03  [facility] of [facility]) CM/SW Contact:  Leeroy Cha, RN Phone Number: 09/22/2018, 11:37 AM   Clinical Narrative:    dcd to home   Final next level of care: Home/Self Care Barriers to Discharge: No Barriers Identified   Patient Goals and CMS Choice Patient states their goals for this hospitalization and ongoing recovery are:: to take of my leg and foot CMS Medicare.gov Compare Post Acute Care list provided to:: Patient    Discharge Placement                       Discharge Plan and Services                                     Social Determinants of Health (SDOH) Interventions     Readmission Risk Interventions No flowsheet data found.

## 2018-09-22 NOTE — Progress Notes (Signed)
Pharmacy Antibiotic Note  Amanda Butler is a 63 y.o. female admitted on 09/19/2018 with cellulitis.  In the ED, patient received Vancomycin 1gm IV and Zosyn 3.375gm IV x 1 dose each.    She has had several rounds of abx. Wound culture is growing out staph aureus so it's not surprising. Sens should be back tomorrow so we will hold off level until then. Scr has been hovering around 1.12. Zosyn has been dced  Plan:   Vancomycin 1750 mg IV Q 24 hrs. Goal AUC 400-550.  Expected AUC: 514.5  SCr used: 0.85  Check daily SCr while on both Vancomycin  F/u vanc levels after sens are back   Height: 5\' 6"  (167.6 cm) Weight: 269 lb 6.4 oz (122.2 kg) IBW/kg (Calculated) : 59.3  Temp (24hrs), Avg:98 F (36.7 C), Min:97.7 F (36.5 C), Max:98.2 F (36.8 C)  Recent Labs  Lab 09/19/18 1225 09/20/18 0333 09/21/18 0420 09/22/18 0331  WBC 7.3 8.4  --   --   CREATININE 0.85 0.86 1.05* 1.12*  LATICACIDVEN 1.6  --   --   --     Estimated Creatinine Clearance: 66.8 mL/min (A) (by C-G formula based on SCr of 1.12 mg/dL (H)).    Allergies  Allergen Reactions  . Bee Venom Anaphylaxis  . Iodine Shortness Of Breath and Rash  . Shellfish Allergy Anaphylaxis  . Actos [Pioglitazone] Other (See Comments)    Erratic heartbeat  . Aspirin Hives  . Latex Hives  . Metformin And Related Other (See Comments)    Patient reports flu-like symptoms and fatigue  . Morphine And Related Other (See Comments)    Childhood allergy (63 years old) , pt got sicker , temp went up, dr gave her the wrong medication  . Nsaids Hives  . Ciprofloxacin Palpitations    Joint pain    Antimicrobials this admission: 6/7 Vanc >>   6/7 Zosyn >> 6/9  Dose adjustments this admission:    Microbiology results: 6/7 BCx: ngtd 6/7 Wound Cx: staph aureus   6/7 Covid 19: negative   6/7 urine>>10k yeast  Onnie Boer, PharmD, [place], AAHIVP, CPP Infectious Disease Pharmacist 09/22/2018 8:41 AM

## 2018-09-22 NOTE — Discharge Summary (Signed)
Physician Discharge Summary  Amanda Butler HRC:[id number] DOB: 1955-08-07 DOA: 09/19/2018  PCP: Gaynelle Arabian, MD  Admit date: 09/19/2018 Discharge date: 09/22/2018  Admitted From: Home Disposition:  Home  Discharge Condition:Stable CODE STATUS:FULL,  Diet recommendation: Heart Healthy   Brief/Interim Summary: Patient is a 63 year old female with history of type 2 diabetes mellitus, hypertension, diastolic CHF, recurrent cellulitis of the left lower extremity, chronic bilateral lower extremity skin changes who presents to the emergency department with complains of left leg pain, redness and tenderness.  She follows with her PCP in wound clinic for the last 3 months for her left lower extremity cellulitis and has been on 3 rounds of different antibiotics including Augmentin, clindamycin and Levaquin.  She reported that she started having foul-smelling drainage from her dressing of the left lower extremity.  Wound care recommended her to come to the emergency department.  Started on IV antibiotics.  Wound care consulted. Patient's left lower extremity cellulitis continued to improve with IV antibiotics.  Currently she is hemodynamically stable, afebrile.    Antibiotics has been changed to oral.  She is stable for discharge to home today.  Following problems were addressed during her hospitalization:  Recurrent left leg cellulitis: Most likely exacerbated by bilateral venous stasis changes.  Failed outpatient antibiotic therapy.  Started on vancomycin and Zosyn .  Cultures remain negative. No DVT as per  venous duplex.  Also given Lasix 20 mg IV once.  Wound care consulted. Cellulitis looks  much better today. She needs to follow-up with wound care as an outpatient.Antibiotics will be changed to oral.  Hypertension: Hypertensive on presentation.  Currently blood pressures acceptable.  Continue current medicines  Type 2 DM:  hemoglobin A1c of 9.4.  Follows with her PCP.  Resume home regimen on  discharge.  Bilateral chronic venous stasis changes/skin thickening/segmentation/long toenails: Needs to follow-up with podiatry as an outpatient.  Obesity: Counseled on diet and weight control.  GERD: Continue famotidine  Chronic diastolic CHF: Echo in 6803 showed ejection fraction of 55 to 21%, grade 2 diastolic dysfunction.  On triamterene-hydrochlorothiazide at home.    Discharge Diagnoses:  Principal Problem:   Left leg cellulitis Active Problems:   Diabetes mellitus type 2, diet-controlled (HCC)   Obesity   GERD (gastroesophageal reflux disease)   Chronic diastolic CHF (congestive heart failure) (HCC)   Accelerated hypertension    Discharge Instructions  Discharge Instructions    Diet - low sodium heart healthy   Complete by:  As directed    Discharge instructions   Complete by:  As directed    1)Please follow up with wound care clinic within a week. 2)Take prescribed medicine as instructed. 3)Follow up with your PCP in 1-2 weeks.   Increase activity slowly   Complete by:  As directed      Allergies as of 09/22/2018      Reactions   Bee Venom Anaphylaxis   Iodine Shortness Of Breath, Rash   Shellfish Allergy Anaphylaxis   Actos [pioglitazone] Other (See Comments)   Erratic heartbeat   Aspirin Hives   Latex Hives   Metformin And Related Other (See Comments)   Patient reports flu-like symptoms and fatigue   Morphine And Related Other (See Comments)   Childhood allergy (63 years old) , pt got sicker , temp went up, dr gave her the wrong medication   Nsaids Hives   Ciprofloxacin Palpitations   Joint pain      Medication List    STOP taking these medications  cephALEXin 500 MG capsule Commonly known as:  KEFLEX   ondansetron 4 MG tablet Commonly known as:  ZOFRAN   ondansetron 8 MG disintegrating tablet Commonly known as:  Zofran ODT     TAKE these medications   acetaminophen 325 MG tablet Commonly known as:  TYLENOL Take 650 mg by mouth  every 6 (six) hours as needed for moderate pain.   albuterol 108 (90 Base) MCG/ACT inhaler Commonly known as:  VENTOLIN HFA Inhale 2 puffs into the lungs 2 (two) times daily as needed for wheezing or shortness of breath.   albuterol (2.5 MG/3ML) 0.083% nebulizer solution Commonly known as:  PROVENTIL Take 2.5 mg by nebulization every 6 (six) hours as needed for wheezing or shortness of breath.   famotidine 10 MG tablet Commonly known as:  PEPCID Take 10 mg by mouth daily as needed for heartburn.   glimepiride 2 MG tablet Commonly known as:  AMARYL Take 2 mg by mouth daily.   lisinopril 10 MG tablet Commonly known as:  ZESTRIL Take 1 tablet (10 mg total) by mouth daily.   metoprolol succinate 50 MG 24 hr tablet Commonly known as:  TOPROL-XL Take 50 mg by mouth daily. Take with or immediately following a meal.   mometasone 50 MCG/ACT nasal spray Commonly known as:  NASONEX Place 2 sprays into the nose daily.   pravastatin 20 MG tablet Commonly known as:  PRAVACHOL Take 20 mg by mouth daily.   repaglinide 1 MG tablet Commonly known as:  PRANDIN Take 1 mg by mouth See admin instructions. Take 1 tablet 15 to 30 mins before meals once a day for diabetes   sulfamethoxazole-trimethoprim 800-160 MG tablet Commonly known as:  BACTRIM DS Take 1 tablet by mouth 2 (two) times daily for 7 days.   triamterene-hydrochlorothiazide 75-50 MG tablet Commonly known as:  MAXZIDE Take 1 tablet by mouth daily.      Follow-up Information    Gaynelle Arabian, MD. Schedule an appointment as soon as possible for a visit in 1 week(s).   Specialty:  Family Medicine Contact information: [address] [place] [address] [phone]          Allergies  Allergen Reactions  . Bee Venom Anaphylaxis  . Iodine Shortness Of Breath and Rash  . Shellfish Allergy Anaphylaxis  . Actos [Pioglitazone] Other (See Comments)    Erratic heartbeat  . Aspirin Hives  . Latex Hives   . Metformin And Related Other (See Comments)    Patient reports flu-like symptoms and fatigue  . Morphine And Related Other (See Comments)    Childhood allergy (63 years old) , pt got sicker , temp went up, dr gave her the wrong medication  . Nsaids Hives  . Ciprofloxacin Palpitations    Joint pain    Consultations:  None   Procedures/Studies: Dg Tibia/fibula Left  Result Date: 09/19/2018 CLINICAL DATA:  Left leg cellulitis. EXAM: LEFT TIBIA AND FIBULA - 2 VIEW; LEFT ANKLE COMPLETE - 3+ VIEW COMPARISON:  None. FINDINGS: No acute fracture or dislocation. Old trauma to the medial malleolus. Chronic deformity of the fifth metatarsal neck. The ankle mortise is symmetric. The talar dome is intact. No tibiotalar joint effusion. Mild tibiotalar and midfoot degenerative spurring. Moderate osteoarthritis of the left knee. Bone mineralization is normal. Large Achilles and plantar enthesophytes. Diffuse soft tissue swelling. IMPRESSION: 1. Diffuse soft tissue swelling.  No acute osseous abnormality. Electronically Signed   By: Titus Dubin M.D.   On: 09/19/2018  12:59   Dg Ankle Complete Left  Result Date: 09/19/2018 CLINICAL DATA:  Left leg cellulitis. EXAM: LEFT TIBIA AND FIBULA - 2 VIEW; LEFT ANKLE COMPLETE - 3+ VIEW COMPARISON:  None. FINDINGS: No acute fracture or dislocation. Old trauma to the medial malleolus. Chronic deformity of the fifth metatarsal neck. The ankle mortise is symmetric. The talar dome is intact. No tibiotalar joint effusion. Mild tibiotalar and midfoot degenerative spurring. Moderate osteoarthritis of the left knee. Bone mineralization is normal. Large Achilles and plantar enthesophytes. Diffuse soft tissue swelling. IMPRESSION: 1. Diffuse soft tissue swelling.  No acute osseous abnormality. Electronically Signed   By: Titus Dubin M.D.   On: 09/19/2018 12:59   Vas [place] Lower Extremity Venous (dvt)  Result Date: 09/20/2018  Lower Venous Study Indications: Swelling.   Limitations: Body habitus and skin texture. Comparison Study: Prior study from 02/20/18 is available for comparison Performing Technologist: Sharion Dove RVS  Examination Guidelines: A complete evaluation includes B-mode imaging, spectral Doppler, color Doppler, and power Doppler as needed of all accessible portions of each vessel. Bilateral testing is considered an integral part of a complete examination. Limited examinations for reoccurring indications may be performed as noted.  Right Technical Findings: Right leg not evaluated.  +---------+---------------+---------+-----------+----------+--------------+ LEFT     CompressibilityPhasicitySpontaneityPropertiesSummary        +---------+---------------+---------+-----------+----------+--------------+ CFV                                                   Not visualized +---------+---------------+---------+-----------+----------+--------------+ SFJ                                                   Not visualized +---------+---------------+---------+-----------+----------+--------------+ FV Prox  Full                                                        +---------+---------------+---------+-----------+----------+--------------+ FV Mid   Full                                                        +---------+---------------+---------+-----------+----------+--------------+ FV DistalFull                                                        +---------+---------------+---------+-----------+----------+--------------+ PFV      Full                                                        +---------+---------------+---------+-----------+----------+--------------+ POP      Full                                                        +---------+---------------+---------+-----------+----------+--------------+  PTV      Full                                                         +---------+---------------+---------+-----------+----------+--------------+ PERO     Full                                                        +---------+---------------+---------+-----------+----------+--------------+     Summary: Left: There is no evidence of deep vein thrombosis in the lower extremity. However, portions of this examination were limited- see technologist comments above.  *See table(s) above for measurements and observations. Electronically signed by Monica Martinez MD on 09/20/2018 at 4:19:08 PM.    Final        Subjective: Patient seen and examined the bedside this morning.  Hemodynamically stable.  Afebrile.  Cellulitis looks significantly improved today.  Still for discharge.  Discharge Exam: Vitals:   09/22/18 0548 09/22/18 0921  BP: (!) 145/74 129/82  Pulse: (!) 54 60  Resp: 16 18  Temp: 97.7 F (36.5 C) 98.1 F (36.7 C)  SpO2: 98% 100%   Vitals:   09/21/18 1427 09/21/18 2027 09/22/18 0548 09/22/18 0921  BP: (!) 111/51 (!) 146/91 (!) 145/74 129/82  Pulse: (!) 55 60 (!) 54 60  Resp: 16 17 16 18   Temp: 98.1 F (36.7 C) 98.2 F (36.8 C) 97.7 F (36.5 C) 98.1 F (36.7 C)  TempSrc: Oral Oral Oral Oral  SpO2: 97% 97% 98% 100%  Weight:      Height:        General: Pt is alert, awake, not in acute distress Cardiovascular: RRR, S1/S2 +, no rubs, no gallops Respiratory: CTA bilaterally, no wheezing, no rhonchi Abdominal: Soft, NT, ND, bowel sounds + Extremities: no edema, no cyanosis, chronic skin changes on bilateral lower extremities, improving cellulitis on left lower extremity    The results of significant diagnostics from this hospitalization (including imaging, microbiology, ancillary and laboratory) are listed below for reference.     Microbiology: Recent Results (from the past 240 hour(s))  Wound or Superficial Culture     Status: None (Preliminary result)   Collection Time: 09/19/18 12:25 PM  Result Value Ref Range Status   Specimen  Description   Final    WOUND Performed at [facility] [address], [place], [place] [address]    Special Requests   Final    LEFT LEG Performed at [facility], [place] [address], [place], [place] [address]    Gram Stain   Final    NO WBC SEEN FEW GRAM POSITIVE COCCI RARE GRAM POSITIVE RODS    Culture   Final    FEW STAPHYLOCOCCUS AUREUS SUSCEPTIBILITIES TO FOLLOW Performed at [facility], [place] [address] [place], [place] [address]    Report Status PENDING  Incomplete  Culture, blood (routine x 2)     Status: None (Preliminary result)   Collection Time: 09/19/18 12:25 PM  Result Value Ref Range Status   Specimen Description   Final    BLOOD RIGHT HAND Performed at [facility] [address], [place],  [address]  Special Requests   Final    BOTTLES DRAWN AEROBIC AND ANAEROBIC Blood Culture results may not be optimal due to an inadequate volume of blood received in culture bottles Performed at [facility] [address] [place], [place] [address]    Culture   Final    NO GROWTH 2 DAYS Performed at [facility] [address] [place], [place] [address]    Report Status PENDING  Incomplete  Culture, blood (routine x 2)     Status: None (Preliminary result)   Collection Time: 09/19/18 12:25 PM  Result Value Ref Range Status   Specimen Description   Final    BLOOD RIGHT ANTECUBITAL Performed at [facility], [place] [address], [place], [place] [address]    Special Requests   Final    BOTTLES DRAWN AEROBIC AND ANAEROBIC Blood Culture adequate volume Performed at [facility] [address], [place], [place] [address]    Culture   Final    NO GROWTH 2 DAYS Performed at [facility] [address] [place], [place] [address]    Report Status PENDING  Incomplete  SARS Coronavirus 2 (CEPHEID - Performed in [facility] hospital lab), Hosp  Order     Status: None   Collection Time: 09/19/18 12:39 PM  Result Value Ref Range Status   SARS Coronavirus 2 NEGATIVE NEGATIVE Final    Comment: (NOTE) If result is NEGATIVE SARS-CoV-2 target nucleic acids are NOT DETECTED. The SARS-CoV-2 RNA is generally detectable in upper and lower  respiratory specimens during the acute phase of infection. The lowest  concentration of SARS-CoV-2 viral copies this assay can detect is 250  copies / mL. A negative result does not preclude SARS-CoV-2 infection  and should not be used as the sole basis for treatment or other  patient management decisions.  A negative result may occur with  improper specimen collection / handling, submission of specimen other  than nasopharyngeal swab, presence of viral mutation(s) within the  areas targeted by this assay, and inadequate number of viral copies  (<250 copies / mL). A negative result must be combined with clinical  observations, patient history, and epidemiological information. If result is POSITIVE SARS-CoV-2 target nucleic acids are DETECTED. The SARS-CoV-2 RNA is generally detectable in upper and lower  respiratory specimens dur ing the acute phase of infection.  Positive  results are indicative of active infection with SARS-CoV-2.  Clinical  correlation with patient history and other diagnostic information is  necessary to determine patient infection status.  Positive results do  not rule out bacterial infection or co-infection with other viruses. If result is PRESUMPTIVE POSTIVE SARS-CoV-2 nucleic acids MAY BE PRESENT.   A presumptive positive result was obtained on the submitted specimen  and confirmed on repeat testing.  While 2019 novel coronavirus  (SARS-CoV-2) nucleic acids may be present in the submitted sample  additional confirmatory testing may be necessary for epidemiological  and / or clinical management purposes  to differentiate between  SARS-CoV-2 and other Sarbecovirus currently  known to infect humans.  If clinically indicated additional testing with an alternate test  methodology [phone]) is advised. The SARS-CoV-2 RNA is generally  detectable in upper and lower respiratory sp ecimens during the acute  phase of infection. The expected result is Negative. Fact Sheet for Patients:  StrictlyIdeas.no Fact Sheet for Healthcare Providers: BankingDealers.co.za This test is not yet approved or cleared by the [place] FDA and has been  authorized for detection and/or diagnosis of SARS-CoV-2 by FDA under an Emergency Use Authorization (EUA).  This EUA will remain in effect (meaning this test can be used) for the duration of the COVID-19 declaration under Section 564(b)(1) of the Act, 21 U.S.C. section 360bbb-3(b)(1), unless the authorization is terminated or revoked sooner. Performed at [facility], [place] [address], [place], [place] [address]   Urine culture     Status: Abnormal   Collection Time: 09/19/18  9:19 PM  Result Value Ref Range Status   Specimen Description   Final    URINE, RANDOM Performed at [facility] [address], [place], [place] [address]    Special Requests   Final    NONE Performed at [facility], [place] [address] [place], [place] [address]    Culture 10,000 COLONIES/mL YEAST (A)  Final   Report Status 09/21/2018 FINAL  Final     Labs: BNP (last 3 results) Recent Labs    02/19/18 2056  BNP 91.4   Basic Metabolic Panel: Recent Labs  Lab 09/19/18 1225 09/20/18 0333 09/21/18 0420 09/22/18 0331  NA 136 135  --   --   K 3.6 3.6  --   --   CL 101 98  --   --   CO2 26 26  --   --   GLUCOSE 248* 217*  --   --   BUN 19 18  --   --   CREATININE 0.85 0.86 1.05* 1.12*  CALCIUM 9.1 9.1  --   --    Liver Function Tests: Recent Labs  Lab 09/19/18 1225  AST 15  ALT 20  ALKPHOS 73  BILITOT 0.8  PROT 7.6  ALBUMIN 3.7    No results for input(s): LIPASE, AMYLASE in the last 168 hours. No results for input(s): AMMONIA in the last 168 hours. CBC: Recent Labs  Lab 09/19/18 1225 09/20/18 0333  WBC 7.3 8.4  NEUTROABS 5.4  --   HGB 14.9 14.6  HCT 46.3* 45.3  MCV 89.2 88.6  PLT 210 227   Cardiac Enzymes: No results for input(s): CKTOTAL, CKMB, CKMBINDEX, TROPONINI in the last 168 hours. BNP: Invalid input(s): POCBNP CBG: Recent Labs  Lab 09/20/18 1138 09/20/18 1636 09/20/18 2155 09/21/18 0716 09/21/18 2019  GLUCAP 235* 168* 261* 229* 270*   D-Dimer No results for input(s): DDIMER in the last 72 hours. Hgb A1c Recent Labs    09/19/18 1648  HGBA1C 9.4*   Lipid Profile No results for input(s): CHOL, HDL, LDLCALC, TRIG, CHOLHDL, LDLDIRECT in the last 72 hours. Thyroid function studies No results for input(s): TSH, T4TOTAL, T3FREE, THYROIDAB in the last 72 hours.  Invalid input(s): FREET3 Anemia work up No results for input(s): VITAMINB12, FOLATE, FERRITIN, TIBC, IRON, RETICCTPCT in the last 72 hours. Urinalysis    Component Value Date/Time   COLORURINE YELLOW 09/19/2018 1225   APPEARANCEUR CLEAR 09/19/2018 1225   LABSPEC 1.017 09/19/2018 1225   PHURINE 7.0 09/19/2018 1225   GLUCOSEU >=500 (A) 09/19/2018 1225   HGBUR NEGATIVE 09/19/2018 1225   BILIRUBINUR NEGATIVE 09/19/2018 1225   KETONESUR NEGATIVE 09/19/2018 1225   PROTEINUR NEGATIVE 09/19/2018 1225   NITRITE NEGATIVE 09/19/2018 1225   LEUKOCYTESUR NEGATIVE 09/19/2018 1225   Sepsis Labs Invalid input(s): PROCALCITONIN,  WBC,  LACTICIDVEN Microbiology Recent Results (from the past 240 hour(s))  Wound or Superficial Culture     Status: None (Preliminary result)   Collection Time: 09/19/18 12:25 PM  Result Value Ref  Range Status   Specimen Description   Final    WOUND Performed at [facility] [address] [place], [place] [address]    Special Requests   Final    LEFT LEG Performed at [facility], [place] [address] [place], [place] [address]    Gram Stain   Final    NO WBC SEEN FEW GRAM POSITIVE COCCI RARE GRAM POSITIVE RODS    Culture   Final    FEW STAPHYLOCOCCUS AUREUS SUSCEPTIBILITIES TO FOLLOW Performed at [facility], [place] [address], [place], [place] [address]    Report Status PENDING  Incomplete  Culture, blood (routine x 2)     Status: None (Preliminary result)   Collection Time: 09/19/18 12:25 PM  Result Value Ref Range Status   Specimen Description   Final    BLOOD RIGHT HAND Performed at [facility] [address] [place], [place] [address]    Special Requests   Final    BOTTLES DRAWN AEROBIC AND ANAEROBIC Blood Culture results may not be optimal due to an inadequate volume of blood received in culture bottles Performed at [facility] [address] [place], [place] [address]    Culture   Final    NO GROWTH 2 DAYS Performed at [facility] [address], [place], [place] [address]    Report Status PENDING  Incomplete  Culture, blood (routine x 2)     Status: None (Preliminary result)   Collection Time: 09/19/18 12:25 PM  Result Value Ref Range Status   Specimen Description   Final    BLOOD RIGHT ANTECUBITAL Performed at [facility], [place] [address] [place], [place] [address]    Special Requests   Final    BOTTLES DRAWN AEROBIC AND ANAEROBIC Blood Culture adequate volume Performed at [facility] [address] [place], [place] [address]    Culture   Final    NO GROWTH 2 DAYS Performed at [facility] [address], [place], [place] [address]    Report Status PENDING  Incomplete  SARS Coronavirus 2 (CEPHEID - Performed in [facility] hospital lab), Hosp Order     Status: None   Collection Time: 09/19/18 12:39 PM  Result Value Ref Range Status   SARS Coronavirus 2 NEGATIVE NEGATIVE Final    Comment: (NOTE) If result is NEGATIVE SARS-CoV-2  target nucleic acids are NOT DETECTED. The SARS-CoV-2 RNA is generally detectable in upper and lower  respiratory specimens during the acute phase of infection. The lowest  concentration of SARS-CoV-2 viral copies this assay can detect is 250  copies / mL. A negative result does not preclude SARS-CoV-2 infection  and should not be used as the sole basis for treatment or other  patient management decisions.  A negative result may occur with  improper specimen collection / handling, submission of specimen other  than nasopharyngeal swab, presence of viral mutation(s) within the  areas targeted by this assay, and inadequate number of viral copies  (<250 copies / mL). A negative result must be combined with clinical  observations, patient history, and epidemiological information. If result is POSITIVE SARS-CoV-2 target nucleic acids are DETECTED. The SARS-CoV-2 RNA is generally detectable in upper and lower  respiratory specimens dur ing the acute phase of infection.  Positive  results are indicative of active infection with SARS-CoV-2.  Clinical  correlation with patient history and other diagnostic information  is  necessary to determine patient infection status.  Positive results do  not rule out bacterial infection or co-infection with other viruses. If result is PRESUMPTIVE POSTIVE SARS-CoV-2 nucleic acids MAY BE PRESENT.   A presumptive positive result was obtained on the submitted specimen  and confirmed on repeat testing.  While 2019 novel coronavirus  (SARS-CoV-2) nucleic acids may be present in the submitted sample  additional confirmatory testing may be necessary for epidemiological  and / or clinical management purposes  to differentiate between  SARS-CoV-2 and other Sarbecovirus currently known to infect humans.  If clinically indicated additional testing with an alternate test  methodology [phone]) is advised. The SARS-CoV-2 RNA is generally  detectable in upper and lower  respiratory sp ecimens during the acute  phase of infection. The expected result is Negative. Fact Sheet for Patients:  StrictlyIdeas.no Fact Sheet for Healthcare Providers: BankingDealers.co.za This test is not yet approved or cleared by the [place] FDA and has been authorized for detection and/or diagnosis of SARS-CoV-2 by FDA under an Emergency Use Authorization (EUA).  This EUA will remain in effect (meaning this test can be used) for the duration of the COVID-19 declaration under Section 564(b)(1) of the Act, 21 U.S.C. section 360bbb-3(b)(1), unless the authorization is terminated or revoked sooner. Performed at [facility], [place] [address], [place], [place] [address]   Urine culture     Status: Abnormal   Collection Time: 09/19/18  9:19 PM  Result Value Ref Range Status   Specimen Description   Final    URINE, RANDOM Performed at [facility] [address] [place], [place] [address]    Special Requests   Final    NONE Performed at [facility], [place] [address], [place], [place] [address]    Culture 10,000 COLONIES/mL YEAST (A)  Final   Report Status 09/21/2018 FINAL  Final    Please note: You were cared for by a hospitalist during your hospital stay. Once you are discharged, your primary care physician will handle any further medical issues. Please note that NO REFILLS for any discharge medications will be authorized once you are discharged, as it is imperative that you return to your primary care physician (or establish a relationship with a primary care physician if you do not have one) for your post hospital discharge needs so that they can reassess your need for medications and monitor your lab values.    Time coordinating discharge: 40 minutes  SIGNED:   Shelly Coss, MD  [facility] 09/22/2018, 11:03 AM Pager [phone]  If 7PM-7AM, please  contact night-coverage [email or web address] Password [email or web address]

## 2018-09-23 LAB — GLUCOSE, CAPILLARY
Glucose-Capillary: 145 mg/dL — ABNORMAL HIGH (ref 70–99)
Glucose-Capillary: 209 mg/dL — ABNORMAL HIGH (ref 70–99)
Glucose-Capillary: 173 mg/dL — ABNORMAL HIGH (ref 70–99)
Glucose-Capillary: 203 mg/dL — ABNORMAL HIGH (ref 70–99)

## 2018-09-24 LAB — CULTURE, BLOOD (ROUTINE X 2)
Culture: NO GROWTH
Culture: NO GROWTH
Special Requests: ADEQUATE

## 2018-09-30 DIAGNOSIS — Z7984 Long term (current) use of oral hypoglycemic drugs: Secondary | ICD-10-CM | POA: Diagnosis not present

## 2018-09-30 DIAGNOSIS — I5030 Unspecified diastolic (congestive) heart failure: Secondary | ICD-10-CM | POA: Diagnosis not present

## 2018-09-30 DIAGNOSIS — L97822 Non-pressure chronic ulcer of other part of left lower leg with fat layer exposed: Secondary | ICD-10-CM | POA: Diagnosis not present

## 2018-09-30 DIAGNOSIS — I252 Old myocardial infarction: Secondary | ICD-10-CM | POA: Diagnosis not present

## 2018-09-30 DIAGNOSIS — Z6841 Body Mass Index (BMI) 40.0 and over, adult: Secondary | ICD-10-CM | POA: Diagnosis not present

## 2018-09-30 DIAGNOSIS — I890 Lymphedema, not elsewhere classified: Secondary | ICD-10-CM | POA: Diagnosis not present

## 2018-09-30 DIAGNOSIS — I872 Venous insufficiency (chronic) (peripheral): Secondary | ICD-10-CM | POA: Diagnosis not present

## 2018-09-30 DIAGNOSIS — I110 Hypertensive heart disease with heart failure: Secondary | ICD-10-CM | POA: Diagnosis not present

## 2018-09-30 DIAGNOSIS — E1140 Type 2 diabetes mellitus with diabetic neuropathy, unspecified: Secondary | ICD-10-CM | POA: Diagnosis not present

## 2018-09-30 DIAGNOSIS — E11622 Type 2 diabetes mellitus with other skin ulcer: Secondary | ICD-10-CM | POA: Diagnosis present

## 2018-10-07 DIAGNOSIS — E11622 Type 2 diabetes mellitus with other skin ulcer: Secondary | ICD-10-CM | POA: Diagnosis not present

## 2018-10-14 ENCOUNTER — Encounter (HOSPITAL_BASED_OUTPATIENT_CLINIC_OR_DEPARTMENT_OTHER): Payer: Medicare Other | Attending: Internal Medicine

## 2018-10-14 DIAGNOSIS — I110 Hypertensive heart disease with heart failure: Secondary | ICD-10-CM | POA: Insufficient documentation

## 2018-10-14 DIAGNOSIS — I252 Old myocardial infarction: Secondary | ICD-10-CM | POA: Insufficient documentation

## 2018-10-14 DIAGNOSIS — I509 Heart failure, unspecified: Secondary | ICD-10-CM | POA: Diagnosis not present

## 2018-10-14 DIAGNOSIS — Z872 Personal history of diseases of the skin and subcutaneous tissue: Secondary | ICD-10-CM | POA: Diagnosis not present

## 2018-10-14 DIAGNOSIS — E1140 Type 2 diabetes mellitus with diabetic neuropathy, unspecified: Secondary | ICD-10-CM | POA: Diagnosis not present

## 2018-10-14 DIAGNOSIS — I890 Lymphedema, not elsewhere classified: Secondary | ICD-10-CM | POA: Insufficient documentation

## 2018-10-14 DIAGNOSIS — Z09 Encounter for follow-up examination after completed treatment for conditions other than malignant neoplasm: Secondary | ICD-10-CM | POA: Insufficient documentation

## 2018-11-14 ENCOUNTER — Other Ambulatory Visit: Payer: Self-pay

## 2018-11-14 ENCOUNTER — Ambulatory Visit (HOSPITAL_COMMUNITY)
Admission: EM | Admit: 2018-11-14 | Discharge: 2018-11-14 | Disposition: A | Discharge status: Home / Self Care | Payer: Medicare Other | Attending: Family Medicine | Admitting: Family Medicine

## 2018-11-14 ENCOUNTER — Encounter (HOSPITAL_COMMUNITY): Payer: Self-pay | Admitting: Family Medicine

## 2018-11-14 DIAGNOSIS — L03116 Cellulitis of left lower limb: Secondary | ICD-10-CM | POA: Diagnosis not present

## 2018-11-14 MED ORDER — CEFTRIAXONE SODIUM 1 G IJ SOLR
INTRAMUSCULAR | Status: AC
  Filled 2018-11-14: qty 10

## 2018-11-14 MED ORDER — DOXYCYCLINE HYCLATE 100 MG PO TABS: 100.0000 mg | ORAL_TABLET | Freq: Two times a day (BID) | ORAL | 0 refills | Status: DC

## 2018-11-14 MED ORDER — CEFTRIAXONE SODIUM 1 G IJ SOLR
1.0000 g | Freq: Once | INTRAMUSCULAR | Status: AC
  Administered 2018-11-14: 1 g via INTRAMUSCULAR

## 2018-11-14 NOTE — ED Provider Notes (Signed)
[place]    CSN: [id number] Arrival date & time: 11/14/18  1013     History   Chief Complaint Chief Complaint  Patient presents with  . Leg Swelling  . Recurrent Skin Infections    cellulitis    HPI Amanda Butler is a 63 y.o. female.   Initial [place] visit for this 63 yo with left leg pain. She was seen two months ago and admitted because of cellulitis.  She has diabetes which is poorly controlled, and heart disease.  She lives alone and is disabled, noting that she does not have a refrigerator. The left leg started swelling and becoming sore two days ago.       Past Medical History:  Diagnosis Date  . Arthritis    "knees" (09/13/2015)  . Asthma   . Cellulitis   . Chronic diastolic CHF (congestive heart failure) ([place])    a. 09/2015 Echo: EF 55-60%, mild LVH, gr2DD, no rwma, mild MR, mildly dil LA.   Marland Kitchen GERD (gastroesophageal reflux disease)   . High cholesterol   . Hypertension   . Midsternal chest pain    a. 09/2015 MV: no ischemia/infarct, EF 61%; b. 09/2015 Low prob V:Q scan.  . Morbid obesity ([place])   . Obesity   . Pneumonia ~ 2000   "mild case"  . RBBB (right bundle branch block) approx 2010   Dr Marisue Humble did ECG and told her  . Type II diabetes mellitus [facility])     Patient Active Problem List   Diagnosis Date Noted  . Accelerated hypertension 09/19/2018  . Pressure injury of skin 02/20/2018  . Cellulitis 10/02/2017  . 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  . Closed nondisplaced fracture of greater tuberosity of left humerus 04/23/2017  . Type II diabetes mellitus ([place])   . High cholesterol   . Morbid obesity ([place])   . Chronic diastolic CHF (congestive heart failure) ([place])   . Midsternal chest pain   . Pain in the chest   . Essential hypertension   . Morbid obesity due to excess calories ([place])   . Chest pain 09/13/2015  . Bilateral lower extremity edema 09/13/2015  . Diabetes mellitus type 2, diet-controlled  ([place]) 09/13/2015  . Obesity 09/13/2015  . Total bilirubin, elevated 09/13/2015  . HTN (hypertension) 09/13/2015  . Stasis dermatitis of both legs 09/13/2015  . Left leg cellulitis 09/13/2015  . RBBB 09/13/2015  . Asthma 09/13/2015  . GERD (gastroesophageal reflux disease) 09/13/2015  . 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    Past Surgical History:  Procedure Laterality Date  . BUNIONECTOMY Left   . COLONOSCOPY WITH PROPOFOL N/A 09/23/2016   Procedure: COLONOSCOPY WITH PROPOFOL;  Surgeon: Garlan Fair, MD;  Location: [facility] ENDOSCOPY;  Service: Endoscopy;  Laterality: N/A;  . MULTIPLE TOOTH EXTRACTIONS  ~ 1975  . TENDON RELEASE Right    Leg  . TUBAL LIGATION  ~ 1983    OB History   No obstetric history on file.      Home Medications    Prior to Admission medications   Medication Sig Start Date End Date Taking? Authorizing Provider  acetaminophen (TYLENOL) 325 MG tablet Take 650 mg by mouth every 6 (six) hours as needed for moderate pain.    [provider]  albuterol (PROVENTIL HFA;VENTOLIN HFA) 108 (90 BASE) MCG/ACT inhaler Inhale 2 puffs into the lungs 2 (two) times daily as needed for wheezing or shortness of breath.     [provider]  albuterol (PROVENTIL) (2.5 MG/3ML) 0.083% nebulizer solution Take 2.5 mg by nebulization every 6 (six) hours as needed for wheezing or shortness of breath.    [provider]  doxycycline (VIBRA-TABS) 100 MG tablet Take 1 tablet (100 mg total) by mouth 2 (two) times daily. 11/14/18   Robyn Haber, MD  famotidine (PEPCID) 10 MG tablet Take 10 mg by mouth daily as needed for heartburn.     [provider]  glimepiride (AMARYL) 2 MG tablet Take 2 mg by mouth daily. 04/07/18   [provider]  lisinopril (PRINIVIL,ZESTRIL) 10 MG tablet Take 1 tablet (10 mg total) by mouth daily. 07/08/16   Croitoru, Mihai, MD  metoprolol succinate (TOPROL-XL) 50 MG 24 hr tablet Take 50 mg by mouth daily. Take with or  immediately following a meal.    [provider]  mometasone (NASONEX) 50 MCG/ACT nasal spray Place 2 sprays into the nose daily.     [provider]  pravastatin (PRAVACHOL) 20 MG tablet Take 20 mg by mouth daily.    [provider]  repaglinide (PRANDIN) 1 MG tablet Take 1 mg by mouth See admin instructions. Take 1 tablet 15 to 30 mins before meals once a day for diabetes 08/19/18   [provider]  triamterene-hydrochlorothiazide (MAXZIDE) 75-50 MG per tablet Take 1 tablet by mouth daily.    [provider]    Family History Family History  Problem Relation Age of Onset  . Diabetes Mother   . Hypertension Mother   . Diabetes Father   . Hypertension Father   . Heart attack Father 21  . Diabetes Maternal Aunt   . Diabetes Maternal Grandmother   . Hypertension Maternal Grandmother   . Cancer Maternal Grandfather   . Diabetes Maternal Grandfather   . Hypertension Maternal Grandfather   . Asthma Paternal Grandmother   . Diabetes Paternal Grandmother   . Hypertension Paternal Grandmother   . Diabetes Paternal Grandfather   . Hypertension Paternal Grandfather   . Heart failure Paternal Grandfather     Social History Social History   Tobacco Use  . Smoking status: Never Smoker  . Smokeless tobacco: Never Used  Substance Use Topics  . Alcohol use: No  . Drug use: No     Allergies   Bee venom, Iodine, Shellfish allergy, Actos [pioglitazone], Aspirin, Latex, Metformin and related, Morphine and related, Nsaids, and Ciprofloxacin   Review of Systems Review of Systems  Musculoskeletal: Positive for gait problem.  Skin: Positive for rash.  All other systems reviewed and are negative.    Physical Exam Triage Vital Signs ED Triage Vitals  Enc Vitals Group     BP      Pulse      Resp      Temp      Temp src      SpO2      Weight      Height      Head Circumference      Peak Flow      Pain Score      Pain Loc      Pain  Edu?      Excl. in [place]?    No data found.  Updated Vital Signs BP (!) 182/92 (BP Location: Right Arm)   Pulse 100   Temp 98.1 F (36.7 C) (Oral)   Resp 16   SpO2 96%   V Physical Exam Vitals signs and nursing note reviewed.  Constitutional:  Appearance: She is obese.  Eyes:     Conjunctiva/sclera: Conjunctivae normal.  Neck:     Musculoskeletal: Normal range of motion and neck supple.  Pulmonary:     Effort: Pulmonary effort is normal.  Musculoskeletal:     Comments: Patient sitting in wheel chair with obvious swelling and redness left leg   Skin:    General: Skin is warm.     Findings: Erythema and rash present.  Neurological:     General: No focal deficit present.     Mental Status: She is alert.  Psychiatric:        Mood and Affect: Mood normal.        UC Treatments / Results  Labs (all labs ordered are listed, but only abnormal results are displayed) Labs Reviewed - No data to display  EKG   Radiology No results found.  Procedures Procedures (including critical care time)  Medications Ordered in UC Medications  cefTRIAXone (ROCEPHIN) injection 1 g (has no administration in time range)    Initial Impression / Assessment and Plan / UC Course  I have reviewed the triage vital signs and the nursing notes.  Pertinent labs & imaging results that were available during my care of the patient were reviewed by me and considered in my medical decision making (see chart for details).    Final Clinical Impressions(s) / UC Diagnoses   Final diagnoses:  Cellulitis of left lower extremity     Discharge Instructions     If your leg shows signs of worsening such as increasing swelling, redness, or pain, please go to the emergency department.  If you are improving, call your personal physician in the next few days to schedule a consultation.    ED Prescriptions    Medication Sig Dispense Auth. Provider   doxycycline (VIBRA-TABS) 100 MG tablet  Take 1 tablet (100 mg total) by mouth 2 (two) times daily. 20 tablet Robyn Haber, MD     Controlled Substance Prescriptions [place] Controlled Substance Registry consulted? Not Applicable   Robyn Haber, MD 11/14/18 1045

## 2018-11-14 NOTE — Discharge Instructions (Addendum)
If your leg shows signs of worsening such as increasing swelling, redness, or pain, please go to the emergency department.  If you are improving, call your personal physician in the next few days to schedule a consultation.

## 2018-11-16 ENCOUNTER — Encounter (HOSPITAL_COMMUNITY): Payer: Self-pay | Admitting: Emergency Medicine

## 2018-11-16 ENCOUNTER — Other Ambulatory Visit: Payer: Self-pay

## 2018-11-16 ENCOUNTER — Emergency Department (HOSPITAL_COMMUNITY): Payer: Medicare Other

## 2018-11-16 ENCOUNTER — Inpatient Hospital Stay (HOSPITAL_COMMUNITY)
Admission: EM | Admit: 2018-11-16 | Discharge: 2018-11-19 | DRG: 603 | Disposition: A | Discharge status: Home Health | Payer: Medicare Other | Attending: Internal Medicine | Admitting: Internal Medicine

## 2018-11-16 DIAGNOSIS — L03116 Cellulitis of left lower limb: Secondary | ICD-10-CM | POA: Diagnosis not present

## 2018-11-16 DIAGNOSIS — R059 Cough, unspecified: Secondary | ICD-10-CM

## 2018-11-16 DIAGNOSIS — I10 Essential (primary) hypertension: Secondary | ICD-10-CM | POA: Diagnosis present

## 2018-11-16 DIAGNOSIS — E7800 Pure hypercholesterolemia, unspecified: Secondary | ICD-10-CM | POA: Diagnosis present

## 2018-11-16 DIAGNOSIS — J45909 Unspecified asthma, uncomplicated: Secondary | ICD-10-CM | POA: Diagnosis present

## 2018-11-16 DIAGNOSIS — Z6841 Body Mass Index (BMI) 40.0 and over, adult: Secondary | ICD-10-CM

## 2018-11-16 DIAGNOSIS — Z91040 Latex allergy status: Secondary | ICD-10-CM

## 2018-11-16 DIAGNOSIS — Z599 Problem related to housing and economic circumstances, unspecified: Secondary | ICD-10-CM

## 2018-11-16 DIAGNOSIS — Z885 Allergy status to narcotic agent status: Secondary | ICD-10-CM

## 2018-11-16 DIAGNOSIS — R05 Cough: Secondary | ICD-10-CM

## 2018-11-16 DIAGNOSIS — I110 Hypertensive heart disease with heart failure: Secondary | ICD-10-CM | POA: Diagnosis present

## 2018-11-16 DIAGNOSIS — Z91030 Bee allergy status: Secondary | ICD-10-CM

## 2018-11-16 DIAGNOSIS — Z91013 Allergy to seafood: Secondary | ICD-10-CM

## 2018-11-16 DIAGNOSIS — I878 Other specified disorders of veins: Secondary | ICD-10-CM | POA: Diagnosis present

## 2018-11-16 DIAGNOSIS — Z881 Allergy status to other antibiotic agents status: Secondary | ICD-10-CM

## 2018-11-16 DIAGNOSIS — K219 Gastro-esophageal reflux disease without esophagitis: Secondary | ICD-10-CM | POA: Diagnosis present

## 2018-11-16 DIAGNOSIS — E11620 Type 2 diabetes mellitus with diabetic dermatitis: Secondary | ICD-10-CM

## 2018-11-16 DIAGNOSIS — Z20828 Contact with and (suspected) exposure to other viral communicable diseases: Secondary | ICD-10-CM | POA: Diagnosis present

## 2018-11-16 DIAGNOSIS — E119 Type 2 diabetes mellitus without complications: Secondary | ICD-10-CM

## 2018-11-16 DIAGNOSIS — I4510 Unspecified right bundle-branch block: Secondary | ICD-10-CM | POA: Diagnosis present

## 2018-11-16 DIAGNOSIS — M170 Bilateral primary osteoarthritis of knee: Secondary | ICD-10-CM | POA: Diagnosis present

## 2018-11-16 DIAGNOSIS — I5032 Chronic diastolic (congestive) heart failure: Secondary | ICD-10-CM | POA: Diagnosis present

## 2018-11-16 DIAGNOSIS — E785 Hyperlipidemia, unspecified: Secondary | ICD-10-CM | POA: Diagnosis present

## 2018-11-16 DIAGNOSIS — I872 Venous insufficiency (chronic) (peripheral): Secondary | ICD-10-CM | POA: Diagnosis present

## 2018-11-16 DIAGNOSIS — Z888 Allergy status to other drugs, medicaments and biological substances status: Secondary | ICD-10-CM

## 2018-11-16 DIAGNOSIS — Z886 Allergy status to analgesic agent status: Secondary | ICD-10-CM

## 2018-11-16 DIAGNOSIS — Z833 Family history of diabetes mellitus: Secondary | ICD-10-CM

## 2018-11-16 DIAGNOSIS — E871 Hypo-osmolality and hyponatremia: Secondary | ICD-10-CM | POA: Diagnosis present

## 2018-11-16 DIAGNOSIS — Z8249 Family history of ischemic heart disease and other diseases of the circulatory system: Secondary | ICD-10-CM

## 2018-11-16 DIAGNOSIS — Z825 Family history of asthma and other chronic lower respiratory diseases: Secondary | ICD-10-CM

## 2018-11-16 DIAGNOSIS — E1165 Type 2 diabetes mellitus with hyperglycemia: Secondary | ICD-10-CM | POA: Diagnosis present

## 2018-11-16 LAB — CBC WITH DIFFERENTIAL/PLATELET
Abs Immature Granulocytes: 0.01 10*3/uL (ref 0.00–0.07)
Basophils Absolute: 0 10*3/uL (ref 0.0–0.1)
Basophils Relative: 1 %
Eosinophils Absolute: 0.1 10*3/uL (ref 0.0–0.5)
Eosinophils Relative: 2 %
HCT: 42.5 % (ref 36.0–46.0)
Hemoglobin: 14.4 g/dL (ref 12.0–15.0)
Immature Granulocytes: 0 %
Lymphocytes Relative: 17 %
Lymphs Abs: 0.7 10*3/uL (ref 0.7–4.0)
MCH: 29.2 pg (ref 26.0–34.0)
MCHC: 33.9 g/dL (ref 30.0–36.0)
MCV: 86.2 fL (ref 80.0–100.0)
Monocytes Absolute: 0.3 10*3/uL (ref 0.1–1.0)
Monocytes Relative: 6 %
Neutro Abs: 3.1 10*3/uL (ref 1.7–7.7)
Neutrophils Relative %: 74 %
Platelets: 162 10*3/uL (ref 150–400)
RBC: 4.93 MIL/uL (ref 3.87–5.11)
RDW: 13.8 % (ref 11.5–15.5)
WBC: 4.2 10*3/uL (ref 4.0–10.5)
nRBC: 0 % (ref 0.0–0.2)

## 2018-11-16 LAB — BASIC METABOLIC PANEL
Anion gap: 13 (ref 5–15)
BUN: 10 mg/dL (ref 8–23)
CO2: 23 mmol/L (ref 22–32)
Calcium: 8.8 mg/dL — ABNORMAL LOW (ref 8.9–10.3)
Chloride: 98 mmol/L (ref 98–111)
Creatinine, Ser: 0.89 mg/dL (ref 0.44–1.00)
GFR calc Af Amer: >60 mL/min (ref >60)
GFR calc non Af Amer: >60 mL/min (ref >60)
Glucose, Bld: 416 mg/dL — ABNORMAL HIGH (ref 70–99)
Potassium: 3.6 mmol/L (ref 3.5–5.1)
Sodium: 134 mmol/L — ABNORMAL LOW (ref 135–145)

## 2018-11-16 LAB — GLUCOSE, CAPILLARY: Glucose-Capillary: 225 mg/dL — ABNORMAL HIGH (ref 70–99)

## 2018-11-16 LAB — SARS CORONAVIRUS 2 (TAT 6-24 HRS): SARS Coronavirus 2: NEGATIVE

## 2018-11-16 LAB — CBG MONITORING, ED: Glucose-Capillary: 297 mg/dL — ABNORMAL HIGH (ref 70–99)

## 2018-11-16 LAB — C-REACTIVE PROTEIN: CRP: 0.8 mg/dL (ref <1.0)

## 2018-11-16 LAB — SEDIMENTATION RATE: Sed Rate: 20 mm/hr (ref 0–22)

## 2018-11-16 LAB — LACTIC ACID, PLASMA
Lactic Acid, Venous: 3.1 mmol/L (ref 0.5–1.9)
Lactic Acid, Venous: 1.9 mmol/L (ref 0.5–1.9)

## 2018-11-16 MED ORDER — METOPROLOL SUCCINATE ER 50 MG PO TB24
50.0000 mg | ORAL_TABLET | Freq: Every day | ORAL | Status: DC
  Administered 2018-11-17 – 2018-11-19 (×3): 50 mg via ORAL
  Filled 2018-11-16 (×3): qty 1

## 2018-11-16 MED ORDER — VANCOMYCIN HCL 10 G IV SOLR
1750.0000 mg | INTRAVENOUS | Status: DC
  Administered 2018-11-17 – 2018-11-18 (×2): 1750 mg via INTRAVENOUS
  Filled 2018-11-16 (×2): qty 1750

## 2018-11-16 MED ORDER — TRIAMTERENE-HCTZ 75-50 MG PO TABS
1.0000 | ORAL_TABLET | Freq: Every day | ORAL | Status: DC
  Administered 2018-11-17 – 2018-11-19 (×3): 1 via ORAL
  Filled 2018-11-16 (×3): qty 1

## 2018-11-16 MED ORDER — SODIUM CHLORIDE 0.9 % IV BOLUS
1000.0000 mL | Freq: Once | INTRAVENOUS | Status: AC
  Administered 2018-11-16: 13:00:00 1000 mL via INTRAVENOUS

## 2018-11-16 MED ORDER — VANCOMYCIN HCL IN DEXTROSE 1-5 GM/200ML-% IV SOLN: 1000.0000 mg | Freq: Once | INTRAVENOUS | Status: DC

## 2018-11-16 MED ORDER — SODIUM CHLORIDE 0.9 % IV SOLN
2.0000 g | Freq: Once | INTRAVENOUS | Status: AC
  Administered 2018-11-16: 12:00:00 2 g via INTRAVENOUS
  Filled 2018-11-16: qty 20

## 2018-11-16 MED ORDER — ALBUTEROL SULFATE HFA 108 (90 BASE) MCG/ACT IN AERS
1.0000 | INHALATION_SPRAY | Freq: Four times a day (QID) | RESPIRATORY_TRACT | Status: DC | PRN
  Administered 2018-11-16: 22:00:00 2 via RESPIRATORY_TRACT
  Filled 2018-11-16: qty 6.7

## 2018-11-16 MED ORDER — INSULIN ASPART 100 UNIT/ML ~~LOC~~ SOLN
0.0000 [IU] | Freq: Three times a day (TID) | SUBCUTANEOUS | Status: DC
  Administered 2018-11-16 – 2018-11-17 (×2): 8 [IU] via SUBCUTANEOUS
  Administered 2018-11-17 – 2018-11-18 (×4): 5 [IU] via SUBCUTANEOUS
  Administered 2018-11-18: 13:00:00 8 [IU] via SUBCUTANEOUS
  Administered 2018-11-19: 09:00:00 5 [IU] via SUBCUTANEOUS

## 2018-11-16 MED ORDER — INSULIN ASPART 100 UNIT/ML ~~LOC~~ SOLN
0.0000 [IU] | Freq: Every day | SUBCUTANEOUS | Status: DC
  Administered 2018-11-16 – 2018-11-17 (×2): 2 [IU] via SUBCUTANEOUS

## 2018-11-16 MED ORDER — FAMOTIDINE 10 MG PO TABS
10.0000 mg | ORAL_TABLET | Freq: Every day | ORAL | Status: DC | PRN
  Administered 2018-11-16: 15:00:00 10 mg via ORAL
  Filled 2018-11-16 (×2): qty 1

## 2018-11-16 MED ORDER — GUAIFENESIN ER 600 MG PO TB12
600.0000 mg | ORAL_TABLET | Freq: Two times a day (BID) | ORAL | Status: DC
  Administered 2018-11-16: 22:00:00 600 mg via ORAL
  Filled 2018-11-16 (×2): qty 1

## 2018-11-16 MED ORDER — PRAVASTATIN SODIUM 10 MG PO TABS
20.0000 mg | ORAL_TABLET | Freq: Every day | ORAL | Status: DC
  Administered 2018-11-17 – 2018-11-18 (×2): 20 mg via ORAL
  Filled 2018-11-16 (×2): qty 2

## 2018-11-16 MED ORDER — VANCOMYCIN HCL 10 G IV SOLR
2000.0000 mg | Freq: Once | INTRAVENOUS | Status: AC
  Administered 2018-11-16: 12:00:00 2000 mg via INTRAVENOUS
  Filled 2018-11-16: qty 2000

## 2018-11-16 MED ORDER — SODIUM CHLORIDE 0.9 % IV SOLN: 2.0000 g | INTRAVENOUS | Status: DC

## 2018-11-16 MED ORDER — SODIUM CHLORIDE 0.9 % IV SOLN
2.0000 g | INTRAVENOUS | Status: DC
  Administered 2018-11-17 – 2018-11-18 (×2): 2 g via INTRAVENOUS
  Filled 2018-11-16 (×2): qty 20

## 2018-11-16 MED ORDER — ALBUTEROL SULFATE (2.5 MG/3ML) 0.083% IN NEBU: 2.5000 mg | INHALATION_SOLUTION | Freq: Four times a day (QID) | RESPIRATORY_TRACT | Status: DC | PRN

## 2018-11-16 MED ORDER — HYDRALAZINE HCL 20 MG/ML IJ SOLN
10.0000 mg | INTRAMUSCULAR | Status: DC | PRN
  Administered 2018-11-16: 15:00:00 10 mg via INTRAVENOUS
  Filled 2018-11-16: qty 1

## 2018-11-16 NOTE — Progress Notes (Signed)
Pharmacy Antibiotic Note  Amanda Butler is a 63 y.o. female admitted on 11/16/2018 with cellulitis. Pharmacy has been consulted for vancomycin dosing. Pt is afebrile and WBC is WNL. Scr is WNL. Lactic acid is elevated. Pt received ceftriaxone x 1 on 8/2 and discharged with a prescription for doxycycline. She did not start this though.   Plan: Vancomycin 2gm IV X 1 then 1750mg  IV Q24H F/u renal fxn, C&S, clinical status and peak/trough at SS  Height: 5\' 6"  (167.6 cm) Weight: 280 lb (127 kg) IBW/kg (Calculated) : 59.3  Temp (24hrs), Avg:98.4 F (36.9 C), Min:98.4 F (36.9 C), Max:98.4 F (36.9 C)  Recent Labs  Lab 11/16/18 0948  WBC 4.2  CREATININE 0.89  LATICACIDVEN 3.1*    Estimated Creatinine Clearance: 86 mL/min (by C-G formula based on SCr of 0.89 mg/dL).    Allergies  Allergen Reactions  . Bee Venom Anaphylaxis  . Iodine Shortness Of Breath and Rash  . Shellfish Allergy Anaphylaxis  . Actos [Pioglitazone] Other (See Comments)    Erratic heartbeat  . Aspirin Hives  . Latex Hives  . Metformin And Related Other (See Comments)    Patient reports flu-like symptoms and fatigue  . Morphine And Related Other (See Comments)    Childhood allergy (63 years old) , pt got sicker , temp went up, dr gave her the wrong medication  . Nsaids Hives  . Ciprofloxacin Palpitations    Joint pain    Antimicrobials this admission: Vanc 8/4>> CTX x 1 8/4  Dose adjustments this admission: N/A  Microbiology results: Pending  Thank you for allowing pharmacy to be a part of this patient's care.  Neima Lacross, Rande Lawman 11/16/2018 11:01 AM

## 2018-11-16 NOTE — Consult Note (Signed)
[facility] Nurse wound consult note Reason for Consult:LLE cellulitis with small area of purulence. Wound type:infectious Pressure Injury POA: NA Measurement: See photo in EMR Wound bed: Area of purulence (pustule) noted on lateral aspect of left LE Drainage (amount, consistency, odor) small amount serous to light yellow Periwound: erythematous, erythematous Dressing procedure/placement/frequency:Recommend washing of LE with soap and water, rinsing lesions with NS and gently patting dry. Cover affected area with xeroform gauze (folded), top with ABD and secure with Kerlix roll gauze/paper tape. Change daily.ELevate LEs while in bed. [place] nursing team will not follow, but will remain available to this patient, the nursing and medical teams.  Please re-consult if needed. Thanks, Maudie Flakes, MSN, RN, [place], Arther Abbott  Pager# [phone]

## 2018-11-16 NOTE — Plan of Care (Signed)
  Problem: Clinical Measurements: Goal: Ability to maintain clinical measurements within normal limits will improve Outcome: Progressing   Problem: Activity: Goal: Risk for activity intolerance will decrease Outcome: Progressing   Problem: Nutrition: Goal: Adequate nutrition will be maintained Outcome: Progressing   Problem: Coping: Goal: Level of anxiety will decrease Outcome: Progressing   Problem: Elimination: Goal: Will not experience complications related to bowel motility Outcome: Progressing   Problem: Safety: Goal: Ability to remain free from injury will improve Outcome: Progressing   Problem: Skin Integrity: Goal: Risk for impaired skin integrity will decrease Outcome: Progressing

## 2018-11-16 NOTE — ED Notes (Signed)
NT assisting pt with ambulating to the rest room

## 2018-11-16 NOTE — ED Notes (Signed)
Dinner Tray Ordered @ 1633-per Benjamine Mola, RN paged by Levada Dy

## 2018-11-16 NOTE — H&P (Addendum)
History and Physical    Amanda Butler KDT:[id number] DOB: 11-12-1955 DOA: 11/16/2018  Referring MD/NP/PA: Nanda Quinton, MD PCP: Gaynelle Arabian, MD  Patient coming from: Home  Chief Complaint: Leg swelling, pain, and drainage.  I have personally briefly reviewed patient's old medical records in [place]   HPI: Amanda Butler is a 63 y.o. female with medical history significant of hypertension, diabetes mellitus type 2, diastolic CHF, recurrent cellulitis of the left leg; who presents with reports of leg swelling and drainage.  Symptoms initially started 4 days ago.   Seen in urgent care 2 days ago, treated with 1 dose of Rocephin IV, and given a prescription sent for doxycycline.  Patient notes that she had not taken any of the doxycycline that was prescribed.  Complains of increased redness, warmth, and drainage which started in the last day.  Denies having any fevers, chills, nausea, vomiting, diarrhea, dysuria, or sick contacts.  She notes associated symptoms of nasal congestion, rhinorrhea, and intermittent cough that she relates to seasonal allergies.  She states that her refrigerator has been broken and changes in insurance have made it hard for her to be compliant with her diabetic regimen.  She previously reports being better controlled on Januvia, but was unable to afford it after changing insurance.  Patient relates the lack of her refrigerator to her inability to be on insulin and why she has been eating takeout as the cause of her elevated blood sugars.  Patient reports she is reached out to multiple different people and she is been told each time that she makes too much money to get assistance.  Last hospitalized in June for left leg cellulitis after failing 3 rounds of oral antibiotics in outpatient setting.  She was treated with IV vancomycin and Zosyn with improvement of symptoms and discharged home on Keflex.  She reports having follow-up with wound care prior to symptoms  improving.  ED Course: Upon admission into the emergency department patient was noted to be afebrile with blood pressures elevated up to 186/78, and all other vital signs maintained within normal limits.  Labs revealed CBC within normal limits, sodium 134, glucose 416, anion gap within normal limits, and initial lactic acid 3.1.  Blood cultures were obtained.  Antibiotics of vancomycin and Rocephin were given.  TRH called to admit.  Review of Systems  Constitutional: Negative for fever and malaise/fatigue.  HENT: Positive for congestion. Negative for ear pain and sinus pain.   Eyes: Negative for photophobia and pain.  Respiratory: Positive for cough, sputum production and wheezing. Negative for shortness of breath.   Cardiovascular: Negative for chest pain and leg swelling.  Gastrointestinal: Negative for abdominal pain, nausea and vomiting.  Genitourinary: Negative for dysuria and frequency.  Musculoskeletal: Positive for myalgias. Negative for falls.  Skin:       Positive for skin color change  Neurological: Negative for loss of consciousness and weakness.  Endo/Heme/Allergies: Positive for environmental allergies.  Psychiatric/Behavioral: Negative for substance abuse.    Past Medical History:  Diagnosis Date  . Arthritis    "knees" (09/13/2015)  . Asthma   . Cellulitis   . Chronic diastolic CHF (congestive heart failure) ([place])    a. 09/2015 Echo: EF 55-60%, mild LVH, gr2DD, no rwma, mild MR, mildly dil LA.   Marland Kitchen GERD (gastroesophageal reflux disease)   . High cholesterol   . Hypertension   . Midsternal chest pain    a. 09/2015 MV: no ischemia/infarct, EF 61%; b. 09/2015 Low  prob V:Q scan.  . Morbid obesity ([place])   . Obesity   . Pneumonia ~ 2000   "mild case"  . RBBB (right bundle branch block) approx 2010   Dr Marisue Humble did ECG and told her  . Type II diabetes mellitus ([place])     Past Surgical History:  Procedure Laterality Date  . BUNIONECTOMY Left   . COLONOSCOPY WITH PROPOFOL  N/A 09/23/2016   Procedure: COLONOSCOPY WITH PROPOFOL;  Surgeon: Garlan Fair, MD;  Location: [facility];  Service: Endoscopy;  Laterality: N/A;  . MULTIPLE TOOTH EXTRACTIONS  ~ 1975  . TENDON RELEASE Right    Leg  . TUBAL LIGATION  ~ 1983     reports that she has never smoked. She has never used smokeless tobacco. She reports that she does not drink alcohol or use drugs.  Allergies  Allergen Reactions  . Bee Venom Anaphylaxis  . Iodine Shortness Of Breath and Rash  . Shellfish Allergy Anaphylaxis  . Actos [Pioglitazone] Other (See Comments)    Erratic heartbeat  . Aspirin Hives  . Latex Hives  . Metformin And Related Other (See Comments)    Patient reports flu-like symptoms and fatigue  . Morphine And Related Other (See Comments)    Childhood allergy (63 years old) , pt got sicker , temp went up, dr gave her the wrong medication  . Nsaids Hives  . Ciprofloxacin Palpitations    Joint pain    Family History  Problem Relation Age of Onset  . Diabetes Mother   . Hypertension Mother   . Diabetes Father   . Hypertension Father   . Heart attack Father 86  . Diabetes Maternal Aunt   . Diabetes Maternal Grandmother   . Hypertension Maternal Grandmother   . Cancer Maternal Grandfather   . Diabetes Maternal Grandfather   . Hypertension Maternal Grandfather   . Asthma Paternal Grandmother   . Diabetes Paternal Grandmother   . Hypertension Paternal Grandmother   . Diabetes Paternal Grandfather   . Hypertension Paternal Grandfather   . Heart failure Paternal Grandfather     Prior to Admission medications   Medication Sig Start Date End Date Taking? Authorizing Provider  acetaminophen (TYLENOL) 325 MG tablet Take 650 mg by mouth every 6 (six) hours as needed for moderate pain.   Yes [provider]  albuterol (PROVENTIL HFA;VENTOLIN HFA) 108 (90 BASE) MCG/ACT inhaler Inhale 2 puffs into the lungs 2 (two) times daily as needed for wheezing or shortness of breath.     Yes [provider]  albuterol (PROVENTIL) (2.5 MG/3ML) 0.083% nebulizer solution Take 2.5 mg by nebulization every 6 (six) hours as needed for wheezing or shortness of breath.   Yes [provider]  famotidine (PEPCID) 10 MG tablet Take 10 mg by mouth daily as needed for heartburn.    Yes [provider]  glimepiride (AMARYL) 2 MG tablet Take 2 mg by mouth daily. 04/07/18  Yes [provider]  metoprolol succinate (TOPROL-XL) 50 MG 24 hr tablet Take 50 mg by mouth daily. Take with or immediately following a meal.   Yes [provider]  mometasone (NASONEX) 50 MCG/ACT nasal spray Place 2 sprays into the nose daily.    Yes [provider]  pravastatin (PRAVACHOL) 20 MG tablet Take 20 mg by mouth daily.   Yes [provider]  repaglinide (PRANDIN) 1 MG tablet Take 1 mg by mouth See admin instructions. Take 1 tablet 15 to 30 mins before meals once  a day for diabetes 08/19/18  Yes [provider]  triamterene-hydrochlorothiazide (MAXZIDE) 75-50 MG per tablet Take 1 tablet by mouth daily.   Yes [provider]  doxycycline (VIBRA-TABS) 100 MG tablet Take 1 tablet (100 mg total) by mouth 2 (two) times daily. Patient not taking: Reported on 11/16/2018 11/14/18   Robyn Haber, MD  lisinopril (PRINIVIL,ZESTRIL) 10 MG tablet Take 1 tablet (10 mg total) by mouth daily. Patient not taking: Reported on 11/16/2018 07/08/16   Croitoru, Dani Gobble, MD    Physical Exam:  Constitutional: Obese female in no acute discomfort Vitals:   11/16/18 0930 11/16/18 1000 11/16/18 1030 11/16/18 1100  BP: (!) 183/106 (!) 186/78 (!) 180/83 (!) 181/77  Pulse: 94 90 84 74  Resp: 13 15 17 16   Temp:      TempSrc:      SpO2: 95% 92% 95% 95%  Weight:      Height:       Eyes: PERRL, lids and conjunctivae normal ENMT: Mucous membranes are moist. Posterior pharynx clear of any exudate or lesions.  Neck: normal, supple, no masses, no thyromegaly  Respiratory: clear to auscultation bilaterally, no wheezing, no crackles. Normal respiratory effort. No accessory muscle use.  Cardiovascular: Regular rate and rhythm, no murmurs / rubs / gallops.  +1 pitting lower extremity edema. 2+ pedal pulses. No carotid bruits.  Abdomen: no tenderness, no masses palpated. No hepatosplenomegaly. Bowel sounds positive.  Musculoskeletal: no clubbing / cyanosis. No joint deformity upper and lower extremities. Good ROM, no contractures. Normal muscle tone.  Skin:Erythema of the left leg with weeping wounds and signs of stasis dermatitis as seen below.    Neurologic: CN 2-12 grossly intact. Sensation intact, DTR normal. Strength 5/5 in all 4.  Psychiatric: Normal judgment and insight. Alert and oriented x 3. Normal mood.     Labs on Admission: I have personally reviewed following labs and imaging studies  CBC: Recent Labs  Lab 11/16/18 0948  WBC 4.2  NEUTROABS 3.1  HGB 14.4  HCT 42.5  MCV 86.2  PLT 372   Basic Metabolic Panel: Recent Labs  Lab 11/16/18 0948  NA 134*  K 3.6  CL 98  CO2 23  GLUCOSE 416*  BUN 10  CREATININE 0.89  CALCIUM 8.8*   GFR: Estimated Creatinine Clearance: 86 mL/min (by C-G formula based on SCr of 0.89 mg/dL). Liver Function Tests: No results for input(s): AST, ALT, ALKPHOS, BILITOT, PROT, ALBUMIN in the last 168 hours. No results for input(s): LIPASE, AMYLASE in the last 168 hours. No results for input(s): AMMONIA in the last 168 hours. Coagulation Profile: No results for input(s): INR, PROTIME in the last 168 hours. Cardiac Enzymes: No results for input(s): CKTOTAL, CKMB, CKMBINDEX, TROPONINI in the last 168 hours. BNP (last 3 results) No results for input(s): PROBNP in the last 8760 hours. HbA1C: No results for input(s): HGBA1C in the last 72 hours. CBG: No results for input(s): GLUCAP in the last 168 hours. Lipid Profile: No results for input(s): CHOL, HDL, LDLCALC, TRIG, CHOLHDL, LDLDIRECT in the last  72 hours. Thyroid Function Tests: No results for input(s): TSH, T4TOTAL, FREET4, T3FREE, THYROIDAB in the last 72 hours. Anemia Panel: No results for input(s): VITAMINB12, FOLATE, FERRITIN, TIBC, IRON, RETICCTPCT in the last 72 hours. Urine analysis:    Component Value Date/Time   COLORURINE YELLOW 09/19/2018 1225   APPEARANCEUR CLEAR 09/19/2018 1225   LABSPEC 1.017 09/19/2018 1225   PHURINE 7.0 09/19/2018 1225   GLUCOSEU >=500 (A) 09/19/2018 1225  HGBUR NEGATIVE 09/19/2018 [place] 09/19/2018 [place] 09/19/2018 1225   PROTEINUR NEGATIVE 09/19/2018 1225   NITRITE NEGATIVE 09/19/2018 1225   LEUKOCYTESUR NEGATIVE 09/19/2018 1225   Sepsis Labs: No results found for this or any previous visit (from the past 240 hour(s)).   Radiological Exams on Admission: No results found.   Assessment/Plan Purulent cellulitis of the left leg: Recurrent.  Patient presents with erythema, pain, and swelling of the left lower extremity.  Seen in urgent care and given 1 dose of Rocephin and prescribed prescription of doxycycline.  She did not take any of the doxycycline, but came to the hospital for worsening of symptoms.  Previously hospitalized in June after failed outpatient treatment.  She has a history of chronically elevated D-dimers, but was noted to have negative signs of DVT by Doppler ultrasound in June. Initially started on vancomycin and Rocephin -Admit to a MedSurg bed -Follow-up blood cultures -Add on ESR and CRP -Continue antibiotics of vancomycin per pharmacy and Rocephin due to patient being poorly controlled diabetic -Wound care consult  Diabetes mellitus type 2, with hyperglycemia: Last hemoglobin A1c noted to be uncontrolled at 9.4 on 09/19/2018.  Patient presents with glucose elevated up to 416 without significant anion gap. Home medications include Amaryl and Prandin. -Hypoglycemic protocols -Hold Prandin and Amaryl -CBGs q. AC with moderate SSI   Hyponatremia: Acute.  Sodium mildly low at 134, but within normal limits when adjusting for hyperglycemia. -Continue to monitor  Allergies vs URI: Patient reports having cough, intermittent wheezing, and nasal congestion that she relates to allergies.  Denies any fever or recent sick contacts. -Follow-up COVID-19 screening -Check chest x-ray -Butyryl inhaler as needed for shortness of breath only  Essential hypertension: Uncontrolled.  Systolic blood pressures elevated up to 180 on admission.  Reports taking Home medications include metoprolol and triamterene-hydrochlorothiazide already for today. -Continue home blood pressure medication -Hydralazine IV as needed   Diastolic congestive heart failure: Chronic last EF noted be 55 to 60% with grade 2 diastolic dysfunction.  Patient does not appear grossly fluid overloaded at this time. -Monitor intake and output -Daily weights -Follow-up chest x-ray  Hyperlipidemia -Continue pravastatin  Morbid obesity: BMI 45.19 kg/m  DVT prophylaxis: Lovenox Code Status: Full Family Communication: No family present at bedside Disposition Plan: Possible discharge home in 1 to 2 days Consults called: none  Admission status: observation  Norval Morton MD [facility] Pager [phone]   If 7PM-7AM, please contact night-coverage [email or web address] Password [facility]  11/16/2018, 12:44 PM

## 2018-11-16 NOTE — ED Notes (Signed)
ED TO INPATIENT HANDOFF REPORT  ED Nurse Name and Phone #: Benjamine Mola [phone]  S Name/Age/Gender Amanda Butler 63 y.o. female Room/Bed: 017C/017C  Code Status   Code Status: Prior  Home/SNF/Other Home Patient oriented to: self, place, time and situation Is this baseline? Yes   Triage Complete: Triage complete  Chief Complaint Cellulitis  Triage Note Pt went to urgent care Sunday for left leg swelling, rubor, and pain. Gave her antibiotic shot and told her if it doesn't get better by today, to seek additional treatment. Urgent care gave a prescription for doxycycline, patient was unable to fill it until today due to cost of medication.    Allergies Allergies  Allergen Reactions  . Bee Venom Anaphylaxis  . Iodine Shortness Of Breath and Rash  . Shellfish Allergy Anaphylaxis  . Actos [Pioglitazone] Other (See Comments)    Erratic heartbeat  . Aspirin Hives  . Latex Hives  . Metformin And Related Other (See Comments)    Patient reports flu-like symptoms and fatigue  . Morphine And Related Other (See Comments)    Childhood allergy (63 years old) , pt got sicker , temp went up, dr gave her the wrong medication  . Nsaids Hives  . Ciprofloxacin Palpitations    Joint pain    Level of Care/Admitting Diagnosis ED Disposition    ED Disposition Condition [facility] Area: [place] [100100]  Level of Care: Telemetry Medical [104]  I expect the patient will be discharged within 24 hours: No (not a candidate for 5C-Observation unit)  Covid Evaluation: Asymptomatic Screening Protocol (No Symptoms)  Diagnosis: Cellulitis of left leg [829562]  Admitting Physician: Norval Morton [[id number]]  Attending Physician: Norval Morton [[id number]]  PT Class (Do Not Modify): Observation [104]  PT Acc Code (Do Not Modify): Observation [10022]       B Medical/Surgery History Past Medical History:  Diagnosis Date  . Arthritis    "knees" (09/13/2015)   . Asthma   . Cellulitis   . Chronic diastolic CHF (congestive heart failure) ([place])    a. 09/2015 Echo: EF 55-60%, mild LVH, gr2DD, no rwma, mild MR, mildly dil LA.   Marland Kitchen GERD (gastroesophageal reflux disease)   . High cholesterol   . Hypertension   . Midsternal chest pain    a. 09/2015 MV: no ischemia/infarct, EF 61%; b. 09/2015 Low prob V:Q scan.  . Morbid obesity ([place])   . Obesity   . Pneumonia ~ 2000   "mild case"  . RBBB (right bundle branch block) approx 2010   Dr Marisue Humble did ECG and told her  . Type II diabetes mellitus ([place])    Past Surgical History:  Procedure Laterality Date  . BUNIONECTOMY Left   . COLONOSCOPY WITH PROPOFOL N/A 09/23/2016   Procedure: COLONOSCOPY WITH PROPOFOL;  Surgeon: Garlan Fair, MD;  Location: [facility];  Service: Endoscopy;  Laterality: N/A;  . MULTIPLE TOOTH EXTRACTIONS  ~ 1975  . TENDON RELEASE Right    Leg  . TUBAL LIGATION  ~ 1983     A IV Location/Drains/Wounds Patient Lines/Drains/Airways Status   Active Line/Drains/Airways    Name:   Placement date:   Placement time:   Site:   Days:   Peripheral IV 11/16/18 Right Hand   11/16/18    0944    Hand   less than 1   Wound / Incision (Open or Dehisced) 02/19/18 Diabetic ulcer Pretibial Left   02/19/18    2048  Pretibial   270          Intake/Output Last 24 hours  Intake/Output Summary (Last 24 hours) at 11/16/2018 1724 Last data filed at 11/16/2018 1441 Gross per 24 hour  Intake 1569.3 ml  Output -  Net 1569.3 ml    Labs/Imaging Results for orders placed or performed during the hospital encounter of 11/16/18 (from the past 48 hour(s))  Basic metabolic panel     Status: Abnormal   Collection Time: 11/16/18  9:48 AM  Result Value Ref Range   Sodium 134 (L) 135 - 145 mmol/L   Potassium 3.6 3.5 - 5.1 mmol/L   Chloride 98 98 - 111 mmol/L   CO2 23 22 - 32 mmol/L   Glucose, Bld 416 (H) 70 - 99 mg/dL   BUN 10 8 - 23 mg/dL   Creatinine, Ser 0.89 0.44 - 1.00 mg/dL   Calcium 8.8  (L) 8.9 - 10.3 mg/dL   GFR calc non Af Amer >60 >60 mL/min   GFR calc Af Amer >60 >60 mL/min   Anion gap 13 5 - 15    Comment: Performed at [facility], [place] [address], [place], [place] [address]  CBC with Differential     Status: None   Collection Time: 11/16/18  9:48 AM  Result Value Ref Range   WBC 4.2 4.0 - 10.5 K/uL   RBC 4.93 3.87 - 5.11 MIL/uL   Hemoglobin 14.4 12.0 - 15.0 g/dL   HCT 42.5 36.0 - 46.0 %   MCV 86.2 80.0 - 100.0 fL   MCH 29.2 26.0 - 34.0 pg   MCHC 33.9 30.0 - 36.0 g/dL   RDW 13.8 11.5 - 15.5 %   Platelets 162 150 - 400 K/uL   nRBC 0.0 0.0 - 0.2 %   Neutrophils Relative % 74 %   Neutro Abs 3.1 1.7 - 7.7 K/uL   Lymphocytes Relative 17 %   Lymphs Abs 0.7 0.7 - 4.0 K/uL   Monocytes Relative 6 %   Monocytes Absolute 0.3 0.1 - 1.0 K/uL   Eosinophils Relative 2 %   Eosinophils Absolute 0.1 0.0 - 0.5 K/uL   Basophils Relative 1 %   Basophils Absolute 0.0 0.0 - 0.1 K/uL   Immature Granulocytes 0 %   Abs Immature Granulocytes 0.01 0.00 - 0.07 K/uL    Comment: Performed at [facility], [address], [place], [place] [address]  Lactic acid, plasma     Status: Abnormal   Collection Time: 11/16/18  9:48 AM  Result Value Ref Range   Lactic Acid, Venous 3.1 (HH) 0.5 - 1.9 mmol/L    Comment: CRITICAL RESULT CALLED TO, READ BACK BY AND VERIFIED WITH: Karin Lieu RN [phone] BY A BENNETT Performed at [facility], [place] [address], [place], [place] [address]   Lactic acid, plasma     Status: None   Collection Time: 11/16/18 11:32 AM  Result Value Ref Range   Lactic Acid, Venous 1.9 0.5 - 1.9 mmol/L    Comment: Performed at [facility] [address], [place], [place] [address]  CBG monitoring, ED     Status: Abnormal   Collection Time: 11/16/18  4:59 PM  Result Value Ref Range   Glucose-Capillary 297 (H) 70 - 99 mg/dL   Dg Chest Port 1 View  Result Date: 11/16/2018 CLINICAL DATA:  63 year old female with history of left leg swelling  since Saturday. Cough. EXAM: PORTABLE CHEST 1 VIEW COMPARISON:  Chest x-ray 07/04/2018. FINDINGS: Lung volumes are normal. No consolidative airspace disease. No pleural effusions. No pneumothorax. No pulmonary nodule or mass noted. Pulmonary vasculature and the cardiomediastinal silhouette are within normal limits. Aortic atherosclerosis. IMPRESSION: 1.  No radiographic evidence of acute cardiopulmonary disease. 2. Aortic atherosclerosis. Electronically Signed   By: Vinnie Langton M.D.   On: 11/16/2018 17:12    Pending Labs Unresulted Labs (From admission, onward)    Start     Ordered   11/17/18 0500  CBC with Differential/Platelet  Tomorrow morning,   R     11/16/18 1303   11/17/18 2956  Basic metabolic panel  Tomorrow morning,   R     11/16/18 1303   11/16/18 1301  Sedimentation rate  Add-on,   AD     11/16/18 1301   11/16/18 1301  C-reactive protein  Add-on,   AD     11/16/18 1301   11/16/18 1213  SARS CORONAVIRUS 2 Nasal Swab Aptima Multi Swab  (Asymptomatic Patients Labs)  ONCE - STAT,   STAT    Question Answer Comment  Is this test for diagnosis or screening Screening   Symptomatic for COVID-19 as defined by CDC No   Hospitalized for COVID-19 No   Admitted to ICU for COVID-19 No   Previously tested for COVID-19 Yes   Resident in a congregate (group) care setting No   Employed in healthcare setting No   Pregnant No      11/16/18 1212   11/16/18 1056  Blood Culture (routine x 2)  BLOOD CULTURE X 2,   STAT     11/16/18 1056          Vitals/Pain Today's Vitals   11/16/18 1300 11/16/18 1355 11/16/18 1400 11/16/18 1430  BP: (!) 173/76  (!) 166/90 (!) 181/84  Pulse: 76  83 75  Resp: 15  (!) 21   Temp:      TempSrc:      SpO2: 94%  99% 96%  Weight:      Height:      PainSc:  4       Isolation Precautions No active isolations  Medications Medications  vancomycin (VANCOCIN) 1,750 mg in sodium chloride 0.9 % 500 mL IVPB (has no administration in time range)   triamterene-hydrochlorothiazide (MAXZIDE) 75-50 MG per tablet 1 tablet (has no administration in time range)  metoprolol succinate (TOPROL-XL) 24 hr tablet 50 mg (has no administration in time range)  famotidine (PEPCID) tablet 10 mg (10 mg Oral Given 11/16/18 1502)  hydrALAZINE (APRESOLINE) injection 10 mg (10 mg Intravenous Given 11/16/18 1503)  cefTRIAXone (ROCEPHIN) 2 g in sodium chloride 0.9 % 100 mL IVPB (has no administration in time range)  insulin aspart (novoLOG) injection 0-15 Units (8 Units Subcutaneous Given 11/16/18 1708)  insulin aspart (novoLOG) injection 0-5 Units (has no administration in time range)  albuterol (VENTOLIN HFA) 108 (90 Base) MCG/ACT inhaler 1-2 puff (has no administration in time range)  pravastatin (PRAVACHOL) tablet 20 mg (has no administration in time range)  guaiFENesin (MUCINEX) 12 hr tablet 600 mg (has no administration in time range)  cefTRIAXone (ROCEPHIN) 2 g in sodium chloride 0.9 % 100 mL IVPB (0 g Intravenous Stopped 11/16/18 1206)  vancomycin (VANCOCIN) 2,000 mg in sodium chloride 0.9 % 500 mL IVPB (0 mg Intravenous Stopped 11/16/18 1429)  sodium chloride 0.9 % bolus 1,000 mL (0 mLs Intravenous Stopped 11/16/18 1441)    Mobility walks with device Moderate fall risk   Focused Assessments Pulmonary  Assessment Handoff:  Lung sounds:  clear diminished  O2 Device: Room Air     ,    R Recommendations: See Admitting Provider Note  Report given to:   Additional Notes:

## 2018-11-16 NOTE — ED Provider Notes (Signed)
Emergency Department Provider Note   I have reviewed the triage vital signs and the nursing notes.   HISTORY  Chief Complaint Cellulitis   HPI Amanda Butler is a 63 y.o. female with PMH of poorly controlled DM, HTN, HLD, CHF, and chronic venous stasis dermatitis with recurrent LLE cellulitis presents to the emergency department for evaluation of left leg swelling, pain, redness.  She was seen at urgent care 2 days prior for this same complaint.  She was given a shot of Rocephin and discharged home with a prescription for doxycycline.  She was unable to get this medication filled until today but has not started taking the medication.  She noticed increased redness and some drainage in the area and so presented to the emergency department.  She states that the leg feels warm at times but she has not experienced any fever, chills, diaphoresis.  She notes prior history of infection in this leg requiring hospitalization.  Denies any chest pain or shortness of breath symptoms. No URI symptoms.   Past Medical History:  Diagnosis Date  . Arthritis    "knees" (09/13/2015)  . Asthma   . Cellulitis   . Chronic diastolic CHF (congestive heart failure) ([place])    a. 09/2015 Echo: EF 55-60%, mild LVH, gr2DD, no rwma, mild MR, mildly dil LA.   Marland Kitchen GERD (gastroesophageal reflux disease)   . High cholesterol   . Hypertension   . Midsternal chest pain    a. 09/2015 MV: no ischemia/infarct, EF 61%; b. 09/2015 Low prob V:Q scan.  . Morbid obesity ([place])   . Obesity   . Pneumonia ~ 2000   "mild case"  . RBBB (right bundle branch block) approx 2010   Dr Marisue Humble did ECG and told her  . Type II diabetes mellitus [facility])     Patient Active Problem List   Diagnosis Date Noted  . Cellulitis of left leg 11/16/2018  . Accelerated hypertension 09/19/2018  . Pressure injury of skin 02/20/2018  . Cellulitis 10/02/2017  . 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  . Closed  nondisplaced fracture of greater tuberosity of left humerus 04/23/2017  . Type II diabetes mellitus ([place])   . High cholesterol   . Morbid obesity ([place])   . Chronic diastolic CHF (congestive heart failure) ([place])   . Midsternal chest pain   . Pain in the chest   . Essential hypertension   . Morbid obesity due to excess calories ([place])   . Chest pain 09/13/2015  . Bilateral lower extremity edema 09/13/2015  . Diabetes mellitus type 2, diet-controlled ([place]) 09/13/2015  . Obesity 09/13/2015  . Total bilirubin, elevated 09/13/2015  . HTN (hypertension) 09/13/2015  . Stasis dermatitis of both legs 09/13/2015  . Left leg cellulitis 09/13/2015  . RBBB 09/13/2015  . Asthma 09/13/2015  . GERD (gastroesophageal reflux disease) 09/13/2015  . 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    Past Surgical History:  Procedure Laterality Date  . BUNIONECTOMY Left   . COLONOSCOPY WITH PROPOFOL N/A 09/23/2016   Procedure: COLONOSCOPY WITH PROPOFOL;  Surgeon: Garlan Fair, MD;  Location: [facility] ENDOSCOPY;  Service: Endoscopy;  Laterality: N/A;  . MULTIPLE TOOTH EXTRACTIONS  ~ 1975  . TENDON RELEASE Right    Leg  . TUBAL LIGATION  ~ 1983    Allergies Bee venom, Iodine, Shellfish allergy, Actos [pioglitazone], Aspirin, Latex, Metformin and related, Morphine and related, Nsaids, and Ciprofloxacin  Family History  Problem Relation Age of Onset  . Diabetes  Mother   . Hypertension Mother   . Diabetes Father   . Hypertension Father   . Heart attack Father 25  . Diabetes Maternal Aunt   . Diabetes Maternal Grandmother   . Hypertension Maternal Grandmother   . Cancer Maternal Grandfather   . Diabetes Maternal Grandfather   . Hypertension Maternal Grandfather   . Asthma Paternal Grandmother   . Diabetes Paternal Grandmother   . Hypertension Paternal Grandmother   . Diabetes Paternal Grandfather   . Hypertension Paternal Grandfather   . Heart failure Paternal Grandfather     Social History Social History    Tobacco Use  . Smoking status: Never Smoker  . Smokeless tobacco: Never Used  Substance Use Topics  . Alcohol use: No  . Drug use: No    Review of Systems  Constitutional: No fever/chills Eyes: No visual changes. ENT: No sore throat. Cardiovascular: Denies chest pain. Respiratory: Denies shortness of breath. Gastrointestinal: No abdominal pain.  No nausea, no vomiting.  No diarrhea.  No constipation. Genitourinary: Negative for dysuria. Musculoskeletal: Negative for back pain. Skin: Left leg rash and skin drainage.  Neurological: Negative for headaches, focal weakness or numbness.  10-point ROS otherwise negative.  ____________________________________________   PHYSICAL EXAM:  VITAL SIGNS: ED Triage Vitals [11/16/18 0915]  Enc Vitals Group     BP (!) 155/123     Pulse Rate 99     Resp 16     Temp 98.4 F (36.9 C)     Temp Source Oral     SpO2 97 %     Weight 280 lb (127 kg)     Height 5\' 6"  (1.676 m)   Constitutional: Alert and oriented. Well appearing and in no acute distress. Eyes: Conjunctivae are normal.  Head: Atraumatic. Nose: No congestion/rhinnorhea. Mouth/Throat: Mucous membranes are moist.  Neck: No stridor.  Cardiovascular: Normal rate, regular rhythm. Good peripheral circulation. Grossly normal heart sounds.   Respiratory: Normal respiratory effort.  No retractions. Lungs CTAB. Gastrointestinal: Soft and nontender. No distention.  Musculoskeletal: Chronic venous stasis changes with pitting edema bilaterally. 1+. Normal ROM of the bilateral LEs.  Neurologic:  Normal speech and language. Skin:  Skin is warm and dry. Venous stasis changes worse on the left with occasional pustular appearance with overlying cellulitis type changes.   ____________________________________________   LABS (all labs ordered are listed, but only abnormal results are displayed)  Labs Reviewed  BASIC METABOLIC PANEL - Abnormal; Notable for the following components:       Result Value   Sodium 134 (*)    Glucose, Bld 416 (*)    Calcium 8.8 (*)    All other components within normal limits  LACTIC ACID, PLASMA - Abnormal; Notable for the following components:   Lactic Acid, Venous 3.1 (*)    All other components within normal limits  CBG MONITORING, ED - Abnormal; Notable for the following components:   Glucose-Capillary 297 (*)    All other components within normal limits  CULTURE, BLOOD (ROUTINE X 2)  CULTURE, BLOOD (ROUTINE X 2)  SARS CORONAVIRUS 2  CBC WITH DIFFERENTIAL/PLATELET  LACTIC ACID, PLASMA  SEDIMENTATION RATE  C-REACTIVE PROTEIN  CBC WITH DIFFERENTIAL/PLATELET  BASIC METABOLIC PANEL   ____________________________________________  RADIOLOGY  Dg Chest Port 1 View  Result Date: 11/16/2018 CLINICAL DATA:  63 year old female with history of left leg swelling since Saturday. Cough. EXAM: PORTABLE CHEST 1 VIEW COMPARISON:  Chest x-ray 07/04/2018. FINDINGS: Lung volumes are normal. No consolidative airspace disease. No pleural  effusions. No pneumothorax. No pulmonary nodule or mass noted. Pulmonary vasculature and the cardiomediastinal silhouette are within normal limits. Aortic atherosclerosis. IMPRESSION: 1.  No radiographic evidence of acute cardiopulmonary disease. 2. Aortic atherosclerosis. Electronically Signed   By: Vinnie Langton M.D.   On: 11/16/2018 17:12    ____________________________________________   PROCEDURES  Procedure(s) performed:   Procedures  None ____________________________________________   INITIAL IMPRESSION / ASSESSMENT AND PLAN / ED COURSE  Pertinent labs & imaging results that were available during my care of the patient were reviewed by me and considered in my medical decision making (see chart for details).   Patient presents to the emergency department with left leg swelling and redness.  She has history of recurrent cellulitis in this leg thought to be secondary to chronic venous stasis dermatitis  and secondary infection.  She was able to obtain doxycycline but did not start this medication as she was just able to obtain it today and had difficulty with affording it initially.  Her only antibiotic so far has been 1 shot of Rocephin 2 days ago.  She is afebrile here.  No SIRS criteria.  For screening labs including lactate.  No crepitus of the lower extremity to suspect gas-forming organism or deep ulceration to suspect osteomyelitis.  She is a poorly controlled diabetic.   Labs show elevated lactate which cleared with IVF. Abx started in the ED. Patient at high risk for worsening cellulitis and developing sepsis. Will admit for IV Abx and monitoring along with wound care.   Discussed patient's case with Hospitalist, Dr. Tamala Julian to request admission. Patient and family (if present) updated with plan. Care transferred to Hospitalist service.  I reviewed all nursing notes, vitals, pertinent old records, EKGs, labs, imaging (as available).  ____________________________________________  FINAL CLINICAL IMPRESSION(S) / ED DIAGNOSES  Final diagnoses:  Left leg cellulitis     MEDICATIONS GIVEN DURING THIS VISIT:  Medications  vancomycin (VANCOCIN) 1,750 mg in sodium chloride 0.9 % 500 mL IVPB (has no administration in time range)  triamterene-hydrochlorothiazide (MAXZIDE) 75-50 MG per tablet 1 tablet (has no administration in time range)  metoprolol succinate (TOPROL-XL) 24 hr tablet 50 mg (has no administration in time range)  famotidine (PEPCID) tablet 10 mg (10 mg Oral Given 11/16/18 1502)  hydrALAZINE (APRESOLINE) injection 10 mg (10 mg Intravenous Given 11/16/18 1503)  cefTRIAXone (ROCEPHIN) 2 g in sodium chloride 0.9 % 100 mL IVPB (has no administration in time range)  insulin aspart (novoLOG) injection 0-15 Units (8 Units Subcutaneous Given 11/16/18 1708)  insulin aspart (novoLOG) injection 0-5 Units (has no administration in time range)  albuterol (VENTOLIN HFA) 108 (90 Base) MCG/ACT  inhaler 1-2 puff (has no administration in time range)  pravastatin (PRAVACHOL) tablet 20 mg (has no administration in time range)  guaiFENesin (MUCINEX) 12 hr tablet 600 mg (has no administration in time range)  cefTRIAXone (ROCEPHIN) 2 g in sodium chloride 0.9 % 100 mL IVPB (0 g Intravenous Stopped 11/16/18 1206)  vancomycin (VANCOCIN) 2,000 mg in sodium chloride 0.9 % 500 mL IVPB (0 mg Intravenous Stopped 11/16/18 1429)  sodium chloride 0.9 % bolus 1,000 mL (0 mLs Intravenous Stopped 11/16/18 1441)     Note:  This document was prepared using Dragon voice recognition software and may include unintentional dictation errors.  Nanda Quinton, MD Emergency Medicine    , [facility], MD 11/16/18 [phone]

## 2018-11-16 NOTE — Progress Notes (Signed)
Inpatient Diabetes Program Recommendations  AACE/ADA: New Consensus Statement on Inpatient Glycemic Control (2015)  Target Ranges:  Prepandial:   less than 140 mg/dL      Peak postprandial:   less than 180 mg/dL (1-2 hours)      Critically ill patients:  140 - 180 mg/dL   Results for APRILLE, SAWHNEY (MRN [id number]) as of 11/16/2018 14:39  Ref. Range 11/16/2018 09:48  Glucose Latest Ref Range: 70 - 99 mg/dL 416 (H)   Review of Glycemic Control  Diabetes history: DM 2 Outpatient Diabetes medications: Glimepiride 2 mg Daily, Prandin 1 mg with meals  Current orders for Inpatient glycemic control: Novolog 0-15 units tid + 0-5 units qhs  Inpatient Diabetes Program Recommendations:    Admitting glucose 416. Last admission patient was on basal insulin. Please consider Lantus 10 units.  A1c 9.4% on 6/7. DM Coordinator spoke with patient at length on 6/8.  Thanks,  Tama Headings RN, MSN, BC-ADM Inpatient Diabetes Coordinator Team Pager [phone] (8a-5p)

## 2018-11-16 NOTE — ED Notes (Signed)
Wound care completed.

## 2018-11-16 NOTE — ED Triage Notes (Signed)
Pt went to urgent care Sunday for left leg swelling, rubor, and pain. Gave her antibiotic shot and told her if it doesn't get better by today, to seek additional treatment. Urgent care gave a prescription for doxycycline, patient was unable to fill it until today due to cost of medication.

## 2018-11-17 ENCOUNTER — Encounter (HOSPITAL_COMMUNITY): Payer: Self-pay | Admitting: General Practice

## 2018-11-17 DIAGNOSIS — L0390 Cellulitis, unspecified: Secondary | ICD-10-CM

## 2018-11-17 DIAGNOSIS — I872 Venous insufficiency (chronic) (peripheral): Secondary | ICD-10-CM | POA: Diagnosis not present

## 2018-11-17 DIAGNOSIS — L03116 Cellulitis of left lower limb: Secondary | ICD-10-CM | POA: Diagnosis not present

## 2018-11-17 DIAGNOSIS — I10 Essential (primary) hypertension: Secondary | ICD-10-CM | POA: Diagnosis not present

## 2018-11-17 DIAGNOSIS — I5032 Chronic diastolic (congestive) heart failure: Secondary | ICD-10-CM | POA: Diagnosis not present

## 2018-11-17 HISTORY — DX: Cellulitis, unspecified: L03.90

## 2018-11-17 LAB — GLUCOSE, CAPILLARY
Glucose-Capillary: 221 mg/dL — ABNORMAL HIGH (ref 70–99)
Glucose-Capillary: 257 mg/dL — ABNORMAL HIGH (ref 70–99)
Glucose-Capillary: 236 mg/dL — ABNORMAL HIGH (ref 70–99)
Glucose-Capillary: 220 mg/dL — ABNORMAL HIGH (ref 70–99)

## 2018-11-17 LAB — CBC WITH DIFFERENTIAL/PLATELET
Abs Immature Granulocytes: 0.01 10*3/uL (ref 0.00–0.07)
Basophils Absolute: 0 10*3/uL (ref 0.0–0.1)
Basophils Relative: 1 %
Eosinophils Absolute: 0.2 10*3/uL (ref 0.0–0.5)
Eosinophils Relative: 2 %
HCT: 40.3 % (ref 36.0–46.0)
Hemoglobin: 13.5 g/dL (ref 12.0–15.0)
Immature Granulocytes: 0 %
Lymphocytes Relative: 23 %
Lymphs Abs: 1.5 10*3/uL (ref 0.7–4.0)
MCH: 28.9 pg (ref 26.0–34.0)
MCHC: 33.5 g/dL (ref 30.0–36.0)
MCV: 86.3 fL (ref 80.0–100.0)
Monocytes Absolute: 0.5 10*3/uL (ref 0.1–1.0)
Monocytes Relative: 8 %
Neutro Abs: 4.2 10*3/uL (ref 1.7–7.7)
Neutrophils Relative %: 66 %
Platelets: 155 10*3/uL (ref 150–400)
RBC: 4.67 MIL/uL (ref 3.87–5.11)
RDW: 14 % (ref 11.5–15.5)
WBC: 6.4 10*3/uL (ref 4.0–10.5)
nRBC: 0 % (ref 0.0–0.2)

## 2018-11-17 LAB — BASIC METABOLIC PANEL
Anion gap: 13 (ref 5–15)
BUN: 9 mg/dL (ref 8–23)
CO2: 23 mmol/L (ref 22–32)
Calcium: 8.7 mg/dL — ABNORMAL LOW (ref 8.9–10.3)
Chloride: 101 mmol/L (ref 98–111)
Creatinine, Ser: 0.73 mg/dL (ref 0.44–1.00)
GFR calc Af Amer: >60 mL/min (ref >60)
GFR calc non Af Amer: >60 mL/min (ref >60)
Glucose, Bld: 224 mg/dL — ABNORMAL HIGH (ref 70–99)
Potassium: 3.5 mmol/L (ref 3.5–5.1)
Sodium: 137 mmol/L (ref 135–145)

## 2018-11-17 LAB — MRSA PCR SCREENING: MRSA by PCR: POSITIVE — AB

## 2018-11-17 MED ORDER — CHLORHEXIDINE GLUCONATE CLOTH 2 % EX PADS
6.0000 | MEDICATED_PAD | Freq: Every day | CUTANEOUS | Status: DC
  Administered 2018-11-18 – 2018-11-19 (×2): 6 via TOPICAL

## 2018-11-17 MED ORDER — INSULIN ASPART 100 UNIT/ML ~~LOC~~ SOLN
6.0000 [IU] | Freq: Three times a day (TID) | SUBCUTANEOUS | Status: DC
  Administered 2018-11-18 – 2018-11-19 (×4): 6 [IU] via SUBCUTANEOUS

## 2018-11-17 MED ORDER — INSULIN GLARGINE 100 UNIT/ML ~~LOC~~ SOLN
15.0000 [IU] | Freq: Every day | SUBCUTANEOUS | Status: DC
  Administered 2018-11-17: 10:00:00 15 [IU] via SUBCUTANEOUS
  Filled 2018-11-17 (×2): qty 0.15

## 2018-11-17 MED ORDER — GUAIFENESIN ER 600 MG PO TB12
1200.0000 mg | ORAL_TABLET | Freq: Two times a day (BID) | ORAL | Status: DC
  Administered 2018-11-17 – 2018-11-19 (×5): 1200 mg via ORAL
  Filled 2018-11-17 (×5): qty 2

## 2018-11-17 MED ORDER — ENOXAPARIN SODIUM 60 MG/0.6ML ~~LOC~~ SOLN
60.0000 mg | SUBCUTANEOUS | Status: DC
  Administered 2018-11-17 – 2018-11-18 (×2): 60 mg via SUBCUTANEOUS
  Filled 2018-11-17 (×2): qty 0.6

## 2018-11-17 MED ORDER — LORATADINE 10 MG PO TABS
10.0000 mg | ORAL_TABLET | Freq: Every day | ORAL | Status: DC
  Administered 2018-11-17 – 2018-11-19 (×3): 10 mg via ORAL
  Filled 2018-11-17 (×3): qty 1

## 2018-11-17 MED ORDER — MUPIROCIN 2 % EX OINT
1.0000 "application " | TOPICAL_OINTMENT | Freq: Two times a day (BID) | CUTANEOUS | Status: DC
  Administered 2018-11-17 – 2018-11-19 (×4): 1 via NASAL
  Filled 2018-11-17 (×2): qty 22

## 2018-11-17 MED ORDER — POTASSIUM CHLORIDE CRYS ER 20 MEQ PO TBCR
40.0000 meq | EXTENDED_RELEASE_TABLET | Freq: Once | ORAL | Status: AC
  Administered 2018-11-17: 10:00:00 40 meq via ORAL
  Filled 2018-11-17: qty 2

## 2018-11-17 MED ORDER — LISINOPRIL 10 MG PO TABS
10.0000 mg | ORAL_TABLET | Freq: Every day | ORAL | Status: DC
  Administered 2018-11-17 – 2018-11-19 (×3): 10 mg via ORAL
  Filled 2018-11-17 (×3): qty 1

## 2018-11-17 MED ORDER — FLUTICASONE PROPIONATE 50 MCG/ACT NA SUSP
2.0000 | Freq: Every day | NASAL | Status: DC
  Administered 2018-11-17 – 2018-11-19 (×3): 2 via NASAL
  Filled 2018-11-17: qty 16

## 2018-11-17 NOTE — Progress Notes (Signed)
Attempted to listen and console the patient regarding her anxiety due to possible SNF placement.  The Primary nurse Hoyle Sauer) is at the bedside.  The patient appears very anxious with rambling conversation, crying at times and reported that she felt like she was being forced to go to a SNF.  The patient kept repeating the same story, reports that she does not want to go to a [facility] because she is afraid that she will die there and that she will never get to see her daughter, who is also in a SNF  The patient was encouraged to think about her situation, to speak with her Doctor tomorrow.  Multiple complaints/needs reported- needing a refrigerator to store insulin, taking care of everyone else but no one to take care of her.  The patient appears unkept and obviously has poor hygiene.  Left lower leg noted to have a dressing in place.

## 2018-11-17 NOTE — Progress Notes (Addendum)
Discussed discharge planning with pt. Pt is hesitant to come home and citing multiple reasons. Pt states she lives at home alone and has difficulty "moving around the house" at times. States her mobility has changed since she's "dealth with this problem off on on for a long time". Pt states she can not administer insulin at home d/t dexterity issues in her hands. Feels a nurse can come to her come a "couple times a day" to administer medication. Was informed Medicare would make decision related to this request. Pt also states she has financial problems affording diabetic medications. States she has no refrigerator to store her diabetic medications. When asked how she stores her food pt states she "orders out everyday" and "doesn't buy things that need to be refrigerated". Pt states she is on a "$1100 a month income" and can not afford a refrigerator. States she is ok to "stay here as long as I can". Did offer social work to assist with financial difficulties and Nurse, mental health. Pt declines citing dissatisfaction with social worker's resources. States social work has "set up for me to get a refrigerator three times and each time someone stole it". States she had to get a lawyer involved to settle said matter. Pt went on to accuse staff of "forcing" her into " a nursing home". Was reminded of patient rights and educated on how recommendations are made. Reminded pt that she has verbalized several concerns to this writer related to self care at home. Pt tearful and difficult to console. Did notify MD and charge nurse. Pt's daughter called claiming she is pt's POA. No such record on file. Did update daughter on pt's condition with pt permission. Informed daughter of pt rights and ensured her they will be maintained during her admission at this hospital. Daughter and pt made mentions of contacting a lawyer multiple times. Daughter and pt was given patient advocate information and reminded of how to report any concerns at  any time during stay. Daughter was thankful to this Probation officer. Pt has no further concerns to report. Will continue to maintain dignity and pt right's while encouraging recommendations for care.

## 2018-11-17 NOTE — Plan of Care (Signed)
  Problem: Skin Integrity: Goal: Risk for impaired skin integrity will decrease Outcome: Progressing   Problem: Clinical Measurements: Goal: Cardiovascular complication will be avoided Outcome: Progressing

## 2018-11-17 NOTE — Progress Notes (Addendum)
Inpatient Diabetes Program Recommendations  AACE/ADA: New Consensus Statement on Inpatient Glycemic Control (2015)  Target Ranges:  Prepandial:   less than 140 mg/dL      Peak postprandial:   less than 180 mg/dL (1-2 hours)      Critically ill patients:  140 - 180 mg/dL   Lab Results  Component Value Date   GLUCAP 257 (H) 11/17/2018   HGBA1C 9.4 (H) 09/19/2018    Review of Glycemic Control Results for WINSLOW, VERRILL (MRN [id number]) as of 11/17/2018 16:01  Ref. Range 11/16/2018 23:28 11/17/2018 07:40 11/17/2018 11:03  Glucose-Capillary Latest Ref Range: 70 - 99 mg/dL 225 (H) 221 (H) 257 (H)   Diabetes history: DM 2 Outpatient Diabetes medications: Glimepiride 2 mg Daily, Prandin 1 mg with meals  Current orders for Inpatient glycemic control: Novolog 0-15 units tid + 0-5 units qhs, Lantus 15 units QD  Inpatient Diabetes Program Recommendations:    Consider increasing Lantus to 20 units QD and adding Novolog 6 units TID (assuming patient is consuming >50% of meal).   Spoke to patient regarding outpatient DM control. The conversation was strained as patient frequency would talk over me and had multiple excuses for her health. Reports stopping Januvia in May due to cost and she attributes this to worsening glycemic control. Also, patient does not have a working refrigerator, which is been an issues for several months. After asking specific questions, patient's monthly earnings is $2400, she has ordered refrigerators multiple times and they cannot be delivered due to the integrity of her home and she cannot move even a college sized refrigerator in due to her health status.  Reviewed patient's previous A1c of 9.4%. This was a result from June and I suspect that in the setting of chronic cellulitis today's result would not show improvement. Explained what a A1c is and what it measures. Also reviewed goal A1c with patient, importance of good glucose control @ home, and blood sugar goals. Reviewed  patho of DM, need for insulin, role of pancreas, impact of cellulitis and risk for repeated events with poor glycemic control, including amputation, vascular change and commorbidities.   Patient has a meter and was checking once a day, per advice of PCP. Encouraged that considering infection would recommend checking more frequently and following up with PCP to establish better control.   Patient eats take out and has limited options for food, given lack of ability to refrigerate items.   Discussed insulin, current inpatient glucose trends and potential need for insulin at home, in lieu of cellulits and current trends. Reviewed with patient that she would need a working refrigerator unless insulin pens were ordered each month. However, Patient is not willing to perform injections. Patient states, "I cannot do injections because of my arthritis. I think Home health can come take care of me." Education provided on home health and that frequency would be 1-2 days per week which would not be appropriate. I question the long term ability for this patient to provide self-care. Patient has no questions at this time.  In preparation for discharge, could consider adding on Metformin 500 mg BID and increasing Glimepiride. Is to follow up with PCP on Friday. Will continue to follow.   Thanks, Bronson Curb, MSN, RNC-OB Diabetes Coordinator [phone] (8a-5p)

## 2018-11-17 NOTE — Progress Notes (Signed)
[facility] PROGRESS NOTE  Amanda Butler EUM:[id number] DOB: 1955/07/08 DOA: 11/16/2018 PCP: Gaynelle Arabian, MD  Assessment/Plan:  Recurrent cellulitis of the left leg: Most likely related to bilateral venous stasis changes. Patient reports symptoms began after she removed compression stockings as they were uncomfortable. Seen in urgent care and given IM injection and a prescription which she did not fill. Hospitalized in June for same after failed outpatient treatment. she is afebrile and non-toxic appearing. CRP and sed rate within limits of norma.  -continue IV antibiotics -Follow blood cultures -elevate left leg -appreciate wound consult recs as follows: Recommend washing of LE with soap and water, rinsing lesions with NS and gently patting dry. Cover affected area with xeroform gauze (folded), top with ABD and secure with Kerlix roll gauze/paper tape. Change daily.ELevate LEs while in bed.  Diabetes mellitus type 2, with hyperglycemia: uncontrolled.  Last hemoglobin A1c noted to be  9.4 on 09/19/2018.  Patient presented with glucose elevated up to 416 without significant anion gap. Home medications include Amaryl and Prandin. lantus started -Hypoglycemic protocols -Hold Prandin and Amaryl -CBGs q. AC with moderate SSI -diabetes coordinator consult  Hyponatremia: Acute. Mild.  Sodium mildly low at 134. -Continue to monitor  Allergies vs URI: Patient reports having cough, intermittent wheezing, and nasal congestion that she relates to allergies.  Denies any fever or recent sick contacts. COVID-19 negative. Chest xray without cardio pulm process -home inhalers -Butyryl inhaler as needed for shortness of breath only  Essential hypertension: Uncontrolled.  Systolic blood pressures elevated up to 180 on admission.  Home medications include metoprolol and triamterene-hydrochlorothiazide..improved control with home meds. Wonder if she is really compliant at home. -Continue home blood  pressure medication -Hydralazine IV as needed   Diastolic congestive heart failure: compensated.  Chronic last EF noted be 55 to 60% with grade 2 diastolic dysfunction.. -Monitor intake and output -Daily weights  Hyperlipidemia -Continue pravastatin   Code Status: full Family Communication: patient Disposition Plan: home hopefully tomorrow   Consultants:  wound  Procedures:    Antibiotics:  Vancomycin 8/4>>>  Rocephin 8/4>>>  HPI/Subjective: Sitting in recliner. Smiling. Denies pain/discomfort   Objective: Vitals:   11/17/18 0352 11/17/18 0744  BP: (!) 154/65 103/68  Pulse: 72 70  Resp: 20 18  Temp: 98.6 F (37 C) 98.2 F (36.8 C)  SpO2: 98% 98%    Intake/Output Summary (Last 24 hours) at 11/17/2018 1325 Last data filed at 11/17/2018 0959 Gross per 24 hour  Intake 2199.78 ml  Output -  Net 2199.78 ml   Filed Weights   11/16/18 0915  Weight: 127 kg    Exam:   General:  Obese awake no acute distress  Cardiovascular: rrr no mgr 1-2+ LE edema bilaterally dressing to left LE clean and dry  Respiratory: normal effort BS clear bilaterally no wheeze  Abdomen: obese soft +BS no guarding or rebounding  Musculoskeletal: joints without swelling/erythema   Data Reviewed: Basic Metabolic Panel: Recent Labs  Lab 11/16/18 0948 11/17/18 0250  NA 134* 137  K 3.6 3.5  CL 98 101  CO2 23 23  GLUCOSE 416* 224*  BUN 10 9  CREATININE 0.89 0.73  CALCIUM 8.8* 8.7*   Liver Function Tests: No results for input(s): AST, ALT, ALKPHOS, BILITOT, PROT, ALBUMIN in the last 168 hours. No results for input(s): LIPASE, AMYLASE in the last 168 hours. No results for input(s): AMMONIA in the last 168 hours. CBC: Recent Labs  Lab 11/16/18 0948 11/17/18 0250  WBC 4.2 6.4  NEUTROABS 3.1 4.2  HGB 14.4 13.5  HCT 42.5 40.3  MCV 86.2 86.3  PLT 162 155   Cardiac Enzymes: No results for input(s): CKTOTAL, CKMB, CKMBINDEX, TROPONINI in the last 168 hours. BNP  (last 3 results) Recent Labs    02/19/18 2056  BNP 88.0    ProBNP (last 3 results) No results for input(s): PROBNP in the last 8760 hours.  CBG: Recent Labs  Lab 11/16/18 1659 11/16/18 2328 11/17/18 0740 11/17/18 1103  GLUCAP 297* 225* 221* 257*    Recent Results (from the past 240 hour(s))  SARS CORONAVIRUS 2 Nasal Swab Aptima Multi Swab     Status: None   Collection Time: 11/16/18 12:13 PM   Specimen: Aptima Multi Swab; Nasal Swab  Result Value Ref Range Status   SARS Coronavirus 2 NEGATIVE NEGATIVE Final    Comment: (NOTE) SARS-CoV-2 target nucleic acids are NOT DETECTED. The SARS-CoV-2 RNA is generally detectable in upper and lower respiratory specimens during the acute phase of infection. Negative results do not preclude SARS-CoV-2 infection, do not rule out co-infections with other pathogens, and should not be used as the sole basis for treatment or other patient management decisions. Negative results must be combined with clinical observations, patient history, and epidemiological information. The expected result is Negative. Fact Sheet for Patients: SugarRoll.be Fact Sheet for Healthcare Providers: https://www.woods-mathews.com/ This test is not yet approved or cleared by the [place] FDA and  has been authorized for detection and/or diagnosis of SARS-CoV-2 by FDA under an Emergency Use Authorization (EUA). This EUA will remain  in effect (meaning this test can be used) for the duration of the COVID-19 declaration under Section 56 4(b)(1) of the Act, 21 U.S.C. section 360bbb-3(b)(1), unless the authorization is terminated or revoked sooner. Performed at [facility], [place] [address] [place], [place] [address]      Studies: Dg Chest Port 1 View  Result Date: 11/16/2018 CLINICAL DATA:  63 year old female with history of left leg swelling since Saturday. Cough. EXAM: PORTABLE CHEST 1 VIEW COMPARISON:  Chest  x-ray 07/04/2018. FINDINGS: Lung volumes are normal. No consolidative airspace disease. No pleural effusions. No pneumothorax. No pulmonary nodule or mass noted. Pulmonary vasculature and the cardiomediastinal silhouette are within normal limits. Aortic atherosclerosis. IMPRESSION: 1.  No radiographic evidence of acute cardiopulmonary disease. 2. Aortic atherosclerosis. Electronically Signed   By: Vinnie Langton M.D.   On: 11/16/2018 17:12    Scheduled Meds: . fluticasone  2 spray Each Nare Daily  . guaiFENesin  1,200 mg Oral BID  . insulin aspart  0-15 Units Subcutaneous TID WC  . insulin aspart  0-5 Units Subcutaneous QHS  . insulin glargine  15 Units Subcutaneous Daily  . loratadine  10 mg Oral Daily  . metoprolol succinate  50 mg Oral Daily  . pravastatin  20 mg Oral q1800  . triamterene-hydrochlorothiazide  1 tablet Oral Daily   Continuous Infusions: . cefTRIAXone (ROCEPHIN)  IV    . vancomycin      Principal Problem:   Left leg cellulitis Active Problems:   Chronic diastolic CHF (congestive heart failure) (HCC)   HTN (hypertension)   Morbid obesity due to excess calories (HCC)   Type II diabetes mellitus ([place])   Cellulitis of left leg    Time spent: 45 minutes    [place] NP  [facility]  If 7PM-7AM, please contact night-coverage at [email or web address], password [facility] 11/17/2018, 1:25 PM  LOS: 0 days

## 2018-11-18 DIAGNOSIS — E7800 Pure hypercholesterolemia, unspecified: Secondary | ICD-10-CM | POA: Diagnosis present

## 2018-11-18 DIAGNOSIS — I10 Essential (primary) hypertension: Secondary | ICD-10-CM | POA: Diagnosis not present

## 2018-11-18 DIAGNOSIS — E871 Hypo-osmolality and hyponatremia: Secondary | ICD-10-CM | POA: Diagnosis present

## 2018-11-18 DIAGNOSIS — Z881 Allergy status to other antibiotic agents status: Secondary | ICD-10-CM | POA: Diagnosis not present

## 2018-11-18 DIAGNOSIS — Z599 Problem related to housing and economic circumstances, unspecified: Secondary | ICD-10-CM | POA: Diagnosis not present

## 2018-11-18 DIAGNOSIS — M170 Bilateral primary osteoarthritis of knee: Secondary | ICD-10-CM | POA: Diagnosis present

## 2018-11-18 DIAGNOSIS — J45909 Unspecified asthma, uncomplicated: Secondary | ICD-10-CM | POA: Diagnosis present

## 2018-11-18 DIAGNOSIS — Z886 Allergy status to analgesic agent status: Secondary | ICD-10-CM | POA: Diagnosis not present

## 2018-11-18 DIAGNOSIS — I5032 Chronic diastolic (congestive) heart failure: Secondary | ICD-10-CM | POA: Diagnosis present

## 2018-11-18 DIAGNOSIS — Z20828 Contact with and (suspected) exposure to other viral communicable diseases: Secondary | ICD-10-CM | POA: Diagnosis present

## 2018-11-18 DIAGNOSIS — I878 Other specified disorders of veins: Secondary | ICD-10-CM | POA: Diagnosis present

## 2018-11-18 DIAGNOSIS — L03116 Cellulitis of left lower limb: Secondary | ICD-10-CM | POA: Diagnosis present

## 2018-11-18 DIAGNOSIS — Z91030 Bee allergy status: Secondary | ICD-10-CM | POA: Diagnosis not present

## 2018-11-18 DIAGNOSIS — Z885 Allergy status to narcotic agent status: Secondary | ICD-10-CM | POA: Diagnosis not present

## 2018-11-18 DIAGNOSIS — R05 Cough: Secondary | ICD-10-CM | POA: Diagnosis present

## 2018-11-18 DIAGNOSIS — I4510 Unspecified right bundle-branch block: Secondary | ICD-10-CM | POA: Diagnosis present

## 2018-11-18 DIAGNOSIS — I872 Venous insufficiency (chronic) (peripheral): Secondary | ICD-10-CM | POA: Diagnosis present

## 2018-11-18 DIAGNOSIS — K219 Gastro-esophageal reflux disease without esophagitis: Secondary | ICD-10-CM | POA: Diagnosis present

## 2018-11-18 DIAGNOSIS — I110 Hypertensive heart disease with heart failure: Secondary | ICD-10-CM | POA: Diagnosis present

## 2018-11-18 DIAGNOSIS — Z6841 Body Mass Index (BMI) 40.0 and over, adult: Secondary | ICD-10-CM | POA: Diagnosis not present

## 2018-11-18 DIAGNOSIS — Z91040 Latex allergy status: Secondary | ICD-10-CM | POA: Diagnosis not present

## 2018-11-18 DIAGNOSIS — Z91013 Allergy to seafood: Secondary | ICD-10-CM | POA: Diagnosis not present

## 2018-11-18 DIAGNOSIS — E1165 Type 2 diabetes mellitus with hyperglycemia: Secondary | ICD-10-CM | POA: Diagnosis present

## 2018-11-18 DIAGNOSIS — E785 Hyperlipidemia, unspecified: Secondary | ICD-10-CM | POA: Diagnosis present

## 2018-11-18 DIAGNOSIS — Z888 Allergy status to other drugs, medicaments and biological substances status: Secondary | ICD-10-CM | POA: Diagnosis not present

## 2018-11-18 LAB — CBC WITH DIFFERENTIAL/PLATELET
Abs Immature Granulocytes: 0.01 K/uL (ref 0.00–0.07)
Basophils Absolute: 0 K/uL (ref 0.0–0.1)
Basophils Relative: 1 %
Eosinophils Absolute: 0.2 K/uL (ref 0.0–0.5)
Eosinophils Relative: 3 %
HCT: 43.8 % (ref 36.0–46.0)
Hemoglobin: 14.6 g/dL (ref 12.0–15.0)
Immature Granulocytes: 0 %
Lymphocytes Relative: 21 %
Lymphs Abs: 1.3 K/uL (ref 0.7–4.0)
MCH: 29.3 pg (ref 26.0–34.0)
MCHC: 33.3 g/dL (ref 30.0–36.0)
MCV: 88 fL (ref 80.0–100.0)
Monocytes Absolute: 0.5 K/uL (ref 0.1–1.0)
Monocytes Relative: 8 %
Neutro Abs: 4.4 K/uL (ref 1.7–7.7)
Neutrophils Relative %: 67 %
Platelets: 175 K/uL (ref 150–400)
RBC: 4.98 MIL/uL (ref 3.87–5.11)
RDW: 14.1 % (ref 11.5–15.5)
WBC: 6.5 K/uL (ref 4.0–10.5)
nRBC: 0 % (ref 0.0–0.2)

## 2018-11-18 LAB — BASIC METABOLIC PANEL WITH GFR
Anion gap: 11 (ref 5–15)
BUN: 13 mg/dL (ref 8–23)
CO2: 24 mmol/L (ref 22–32)
Calcium: 9 mg/dL (ref 8.9–10.3)
Chloride: 97 mmol/L — ABNORMAL LOW (ref 98–111)
Creatinine, Ser: 0.85 mg/dL (ref 0.44–1.00)
GFR calc Af Amer: >60 mL/min
GFR calc non Af Amer: >60 mL/min
Glucose, Bld: 226 mg/dL — ABNORMAL HIGH (ref 70–99)
Potassium: 4.3 mmol/L (ref 3.5–5.1)
Sodium: 132 mmol/L — ABNORMAL LOW (ref 135–145)

## 2018-11-18 LAB — GLUCOSE, CAPILLARY
Glucose-Capillary: 216 mg/dL — ABNORMAL HIGH (ref 70–99)
Glucose-Capillary: 251 mg/dL — ABNORMAL HIGH (ref 70–99)
Glucose-Capillary: 225 mg/dL — ABNORMAL HIGH (ref 70–99)
Glucose-Capillary: 192 mg/dL — ABNORMAL HIGH (ref 70–99)

## 2018-11-18 LAB — HEMOGLOBIN A1C
Hgb A1c MFr Bld: 9.2 % — ABNORMAL HIGH (ref 4.8–5.6)
Mean Plasma Glucose: 217.34 mg/dL

## 2018-11-18 MED ORDER — INSULIN GLARGINE 100 UNIT/ML ~~LOC~~ SOLN
20.0000 [IU] | Freq: Every day | SUBCUTANEOUS | Status: DC
  Administered 2018-11-18: 11:00:00 20 [IU] via SUBCUTANEOUS
  Filled 2018-11-18 (×2): qty 0.2

## 2018-11-18 MED ORDER — CEPHALEXIN 500 MG PO CAPS
500.0000 mg | ORAL_CAPSULE | Freq: Three times a day (TID) | ORAL | Status: DC
  Administered 2018-11-19: 06:00:00 500 mg via ORAL
  Filled 2018-11-18: qty 1

## 2018-11-18 MED ORDER — DOXYCYCLINE HYCLATE 100 MG PO TABS
100.0000 mg | ORAL_TABLET | Freq: Two times a day (BID) | ORAL | Status: DC
  Administered 2018-11-19: 100 mg via ORAL
  Filled 2018-11-18: qty 1

## 2018-11-18 NOTE — Progress Notes (Signed)
[facility] PROGRESS NOTE  Amanda Butler VOZ:[id number] DOB: [birth year] DOA: 11/16/2018 PCP: Gaynelle Arabian, MD  Assessment/Plan:  Recurrent cellulitisof the left leg: much improved today. Much less swelling and erythema.Most likely related to bilateral venous stasis changes. Patient reports symptoms began after she removed compression stockings as they were uncomfortable. Seen in urgent care and given IM injection and a prescription which she did not fill. Hospitalized in June for same after failed outpatient treatment.she is afebrile and non-toxic appearing. CRP and sed rate within limits of normal. Blood cultures with no growth to date.   -continue IV antibiotics as still some drainage from leg -elevate left leg -appreciate wound consult recs as follows: Recommend washing of LE with soap and water, rinsing lesions with NS and gently patting dry. Cover affected area with xeroform gauze (folded), top with ABD and secure with Kerlix roll gauze/paper tape. Change daily.ELevate LEs while in bed.   Diabetes mellitus type 2, with hyperglycemia: uncontrolled.  Last hemoglobin A1c noted to be  9.2 on 11/17/18. Patient presented with glucose elevated up to 416 without significant anion gap. Home medications include Amaryl and Prandin. lantus started. Evaluated by diabetes coordinator who recommends Lantus 20u daily and meal coverage with SS. Patient did share an unwillingness to perform injections. Patient has impression that [facility] can come daily and give injections. She was educated regarding [place]. Will likely be discharged with metformin 500mg  bid and increased glimepriride.  -HoldingPrandinand Amaryl -CBGs q. AC with moderate SSI - appreciate diabetes coordinator consult  Hyponatremia: Acute. Mild. Sodium mildly low at 132. -Continue to monitor  Allergies vs QQV:ZDGLOVF reports having cough, intermittent wheezing, and nasal congestion that she relates to allergies. Denies any  fever or recent sick contacts. COVID-19 negative. Chest xray without cardio pulm process -home inhalers -Butyryl inhaler as needed for shortness of breath only  Essential hypertension: improved control.Home medications include metoprolol and triamterene-hydrochlorothiazide and lisinopril.  -Continue home blood pressure medication -Hydralazine IV as needed  Diastolic congestive heart failure: compensated.  Chronic last EF noted be 55 to 60% with grade 2 diastolic dysfunction.. -Monitor intake and output -Daily weights  Hyperlipidemia -Continue pravastatin   Code Status: full Family Communication: patient Disposition Plan: home when ready likely tomorrow   Consultants:    Procedures:    Antibiotics:  Vancomycin 8/4>>>  Rocephin 8/4>>>  HPI/Subjective: Sitting up in recliner watching TV with left leg elevated. Denies pain/discomfort  Objective: Vitals:   11/18/18 0902 11/18/18 1238  BP: (!) 147/77 116/61  Pulse: 92 (!) 54  Resp:  18  Temp:  98.1 F (36.7 C)  SpO2:  98%    Intake/Output Summary (Last 24 hours) at 11/18/2018 1407 Last data filed at 11/18/2018 1339 Gross per 24 hour  Intake 1800 ml  Output 0 ml  Net 1800 ml   Filed Weights   11/16/18 0915  Weight: 127 kg    Exam:   General:  Awake alert obese no acute distress  Cardiovascular: rrr no mgr Left leg with minimal swelling and decreased erythema. No odor  Respiratory: normal effort BS clear bilaterally no wheeze  Abdomen: obese soft +BS no guarding or rebounding  Musculoskeletal: joints without swelling/erythema   Data Reviewed: Basic Metabolic Panel: Recent Labs  Lab 11/16/18 0948 11/17/18 0250 11/18/18 0544  NA 134* 137 132*  K 3.6 3.5 4.3  CL 98 101 97*  CO2 23 23 24   GLUCOSE 416* 224* 226*  BUN 10 9 13   CREATININE 0.89 0.73 0.85  CALCIUM  8.8* 8.7* 9.0   Liver Function Tests: No results for input(s): AST, ALT, ALKPHOS, BILITOT, PROT, ALBUMIN in the last 168  hours. No results for input(s): LIPASE, AMYLASE in the last 168 hours. No results for input(s): AMMONIA in the last 168 hours. CBC: Recent Labs  Lab 11/16/18 0948 11/17/18 0250 11/18/18 0544  WBC 4.2 6.4 6.5  NEUTROABS 3.1 4.2 4.4  HGB 14.4 13.5 14.6  HCT 42.5 40.3 43.8  MCV 86.2 86.3 88.0  PLT 162 155 175   Cardiac Enzymes: No results for input(s): CKTOTAL, CKMB, CKMBINDEX, TROPONINI in the last 168 hours. BNP (last 3 results) Recent Labs    02/19/18 2056  BNP 88.0    ProBNP (last 3 results) No results for input(s): PROBNP in the last 8760 hours.  CBG: Recent Labs  Lab 11/17/18 1103 11/17/18 1606 11/17/18 2137 11/18/18 0731 11/18/18 1112  GLUCAP 257* 236* 220* 216* 251*    Recent Results (from the past 240 hour(s))  Blood Culture (routine x 2)     Status: None (Preliminary result)   Collection Time: 11/16/18 11:17 AM   Specimen: BLOOD RIGHT HAND  Result Value Ref Range Status   Specimen Description BLOOD RIGHT HAND  Final   Special Requests   Final    BOTTLES DRAWN AEROBIC AND ANAEROBIC Blood Culture adequate volume   Culture   Final    NO GROWTH 1 DAY Performed at [facility], [place] [address] [place], [place] [address]    Report Status PENDING  Incomplete  Blood Culture (routine x 2)     Status: None (Preliminary result)   Collection Time: 11/16/18 11:43 AM   Specimen: BLOOD RIGHT ARM  Result Value Ref Range Status   Specimen Description BLOOD RIGHT ARM  Final   Special Requests   Final    BOTTLES DRAWN AEROBIC AND ANAEROBIC Blood Culture adequate volume   Culture   Final    NO GROWTH 1 DAY Performed at [facility], [place] [address] [place], [place] [address]    Report Status PENDING  Incomplete  SARS CORONAVIRUS 2 Nasal Swab Aptima Multi Swab     Status: None   Collection Time: 11/16/18 12:13 PM   Specimen: Aptima Multi Swab; Nasal Swab  Result Value Ref Range Status   SARS Coronavirus 2 NEGATIVE NEGATIVE Final    Comment:  (NOTE) SARS-CoV-2 target nucleic acids are NOT DETECTED. The SARS-CoV-2 RNA is generally detectable in upper and lower respiratory specimens during the acute phase of infection. Negative results do not preclude SARS-CoV-2 infection, do not rule out co-infections with other pathogens, and should not be used as the sole basis for treatment or other patient management decisions. Negative results must be combined with clinical observations, patient history, and epidemiological information. The expected result is Negative. Fact Sheet for Patients: SugarRoll.be Fact Sheet for Healthcare Providers: https://www.woods-mathews.com/ This test is not yet approved or cleared by the [place] FDA and  has been authorized for detection and/or diagnosis of SARS-CoV-2 by FDA under an Emergency Use Authorization (EUA). This EUA will remain  in effect (meaning this test can be used) for the duration of the COVID-19 declaration under Section 56 4(b)(1) of the Act, 21 U.S.C. section 360bbb-3(b)(1), unless the authorization is terminated or revoked sooner. Performed at [facility], [place] [address], [place], [place] [address]   MRSA PCR Screening     Status: Abnormal   Collection Time: 11/17/18  3:12 PM   Specimen: Nasal Mucosa;  Nasopharyngeal  Result Value Ref Range Status   MRSA by PCR POSITIVE (A) NEGATIVE Final    Comment:        The GeneXpert MRSA Assay (FDA approved for NASAL specimens only), is one component of a comprehensive MRSA colonization surveillance program. It is not intended to diagnose MRSA infection nor to guide or monitor treatment for MRSA infections. RESULT CALLED TO, READ BACK BY AND VERIFIED WITH: Leonides Grills RN 17:15 11/17/18 ([email or web address]) Performed at [facility], [place] [address] [place], [place] [address]      Studies: Dg Chest Port 1 View  Result Date: 11/16/2018 CLINICAL DATA:  63 year old female with history of  left leg swelling since Saturday. Cough. EXAM: PORTABLE CHEST 1 VIEW COMPARISON:  Chest x-ray 07/04/2018. FINDINGS: Lung volumes are normal. No consolidative airspace disease. No pleural effusions. No pneumothorax. No pulmonary nodule or mass noted. Pulmonary vasculature and the cardiomediastinal silhouette are within normal limits. Aortic atherosclerosis. IMPRESSION: 1.  No radiographic evidence of acute cardiopulmonary disease. 2. Aortic atherosclerosis. Electronically Signed   By: Vinnie Langton M.D.   On: 11/16/2018 17:12    Scheduled Meds: . Chlorhexidine Gluconate Cloth  6 each Topical Q0600  . enoxaparin (LOVENOX) injection  60 mg Subcutaneous Q24H  . fluticasone  2 spray Each Nare Daily  . guaiFENesin  1,200 mg Oral BID  . insulin aspart  0-15 Units Subcutaneous TID WC  . insulin aspart  0-5 Units Subcutaneous QHS  . insulin aspart  6 Units Subcutaneous TID WC  . insulin glargine  20 Units Subcutaneous Daily  . lisinopril  10 mg Oral Daily  . loratadine  10 mg Oral Daily  . metoprolol succinate  50 mg Oral Daily  . mupirocin ointment  1 application Nasal BID  . pravastatin  20 mg Oral q1800  . triamterene-hydrochlorothiazide  1 tablet Oral Daily   Continuous Infusions: . cefTRIAXone (ROCEPHIN)  IV 2 g (11/18/18 1105)  . vancomycin 1,750 mg (11/18/18 1300)    Principal Problem:   Left leg cellulitis Active Problems:   HTN (hypertension)   Stasis dermatitis of both legs   Chronic diastolic CHF (congestive heart failure) (HCC)   Morbid obesity due to excess calories ([place])   Type II diabetes mellitus ([place])    Time spent: 70 minutes    [place] NP  [facility]  If 7PM-7AM, please contact night-coverage at [email or web address], password [facility] 11/18/2018, 2:07 PM  LOS: 0 days

## 2018-11-18 NOTE — Plan of Care (Signed)
  Problem: Activity: Goal: Risk for activity intolerance will decrease Outcome: Progressing   Problem: Nutrition: Goal: Adequate nutrition will be maintained Outcome: Progressing   Problem: Pain Managment: Goal: General experience of comfort will improve Outcome: Progressing   

## 2018-11-18 NOTE — Evaluation (Signed)
Occupational Therapy Evaluation Patient Details Name: Amanda Butler MRN: [id number] DOB: 1955/04/18 Today's Date: 11/18/2018    History of Present Illness Pt is a 63 year old woman admitted 11/16/18 with recurrent L LE cellultis. Pt admitted for same in June 2020. PMH: DM, CHF, HTN, morbid obesity.   Clinical Impression   Pt typically functions at a modified independently level with a cane. Pt is functioning close to her baseline and has all necessary equipment at home. No further OT needs.    Follow Up Recommendations  No OT follow up    Equipment Recommendations  None recommended by OT    Recommendations for Other Services       Precautions / Restrictions Restrictions Weight Bearing Restrictions: No      Mobility Bed Mobility               General bed mobility comments: pt received in chair, returned to chair  Transfers Overall transfer level: Modified independent Equipment used: Rolling walker (2 wheeled)                  Balance                                           ADL either performed or assessed with clinical judgement   ADL Overall ADL's : Modified independent;At baseline                                       General ADL Comments: pt avoids socks, wears slip on shoes     Vision Baseline Vision/History: Wears glasses Patient Visual Report: No change from baseline       Perception     Praxis      Pertinent Vitals/Pain Pain Assessment: No/denies pain     Hand Dominance Right   Extremity/Trunk Assessment Upper Extremity Assessment Upper Extremity Assessment: Overall WFL for tasks assessed   Lower Extremity Assessment Lower Extremity Assessment: Defer to PT evaluation   Cervical / Trunk Assessment Cervical / Trunk Assessment: Other exceptions Cervical / Trunk Exceptions: obesity   Communication Communication Communication: No difficulties   Cognition Arousal/Alertness:  Awake/alert Behavior During Therapy: WFL for tasks assessed/performed Overall Cognitive Status: Within Functional Limits for tasks assessed                                     General Comments       Exercises     Shoulder Instructions      Home Living Family/patient expects to be discharged to:: Private residence Living Arrangements: Alone   Type of Home: House Home Access: Ramped entrance     Home Layout: One level     Bathroom Shower/Tub: Occupational psychologist: Standard     Home Equipment: Environmental consultant - 2 wheels;Wheelchair - manual;Cane - single point;Shower seat;Bedside commode;Adaptive equipment Adaptive Equipment: Sock aid;Reacher;Long-handled shoe horn;Long-handled sponge        Prior Functioning/Environment Level of Independence: Independent with assistive device(s)        Comments: walks with a cane and drives        OT Problem List:        OT Treatment/Interventions:      OT Goals(Current goals can  be found in the care plan section) Acute Rehab OT Goals Patient Stated Goal: return home  OT Frequency:     Barriers to D/C:            Co-evaluation              AM-PAC OT "6 Clicks" Daily Activity     Outcome Measure Help from another person eating meals?: None Help from another person taking care of personal grooming?: None Help from another person toileting, which includes using toliet, bedpan, or urinal?: None Help from another person bathing (including washing, rinsing, drying)?: None Help from another person to put on and taking off regular upper body clothing?: None Help from another person to put on and taking off regular lower body clothing?: None 6 Click Score: 24   End of Session Equipment Utilized During Treatment: Rolling walker  Activity Tolerance: Patient tolerated treatment well Patient left: in chair  OT Visit Diagnosis: Other abnormalities of gait and mobility (R26.89)                Time:  0786-7544 OT Time Calculation (min): 18 min Charges:  OT General Charges $OT Visit: 1 Visit OT Evaluation $OT Eval Moderate Complexity: 1 Mod  Nestor Lewandowsky, OTR/L Acute Rehabilitation Services Pager: [phone] Office: [phone]  Amanda Butler 11/18/2018, 2:37 PM

## 2018-11-18 NOTE — Progress Notes (Signed)
Right lower extremity dressing changed- scant amount of yellowish drainage noted. Xeroform was placed.  The area was wrapped with Kerlix.  The patient has kept lower extremities elevated in recliner most of the day.

## 2018-11-18 NOTE — Evaluation (Signed)
Physical Therapy Evaluation & Discharge Patient Details Name: Amanda Butler MRN: [id number] DOB: 03-11-1956 Today's Date: 11/18/2018   History of Present Illness  Pt is a 63 year old woman admitted 11/16/18 with recurrent L LE cellultis. Pt admitted for same in June 2020. PMH: DM, CHF, HTN, morbid obesity.    Clinical Impression  Pt presented in bathroom with NT getting a wash-up. Prior to admission, pt reported that she ambulates with use of a cane and was independent with ADLs/IADLs. Pt lives alone in a single level home with a ramped entrance. At the time of evaluation, pt at supervision to mod I level for all mobility with use of RW to ambulate. Pt with no LOB or need for physical assistance throughout. Pt reported that she feels she is at her baseline in regards to functional mobility. No further acute PT needs identified at this time. PT signing off.     Follow Up Recommendations No PT follow up    Equipment Recommendations  None recommended by PT    Recommendations for Other Services       Precautions / Restrictions Precautions Precautions: None Restrictions Weight Bearing Restrictions: No      Mobility  Bed Mobility               General bed mobility comments: pt in bathroom upon arrival  Transfers Overall transfer level: Modified independent Equipment used: Rolling walker (2 wheeled)                Ambulation/Gait Ambulation/Gait assistance: Supervision Gait Distance (Feet): 30 Feet Assistive device: Rolling walker (2 wheeled) Gait Pattern/deviations: Step-through pattern;Decreased stride length Gait velocity: decreased   General Gait Details: pt steady with RW, no LOB or need for physical assistance  Stairs            Wheelchair Mobility    Modified Rankin (Stroke Patients Only)       Balance Overall balance assessment: Mild deficits observed, not formally tested                                           Pertinent  Vitals/Pain Pain Assessment: No/denies pain    Home Living Family/patient expects to be discharged to:: Private residence Living Arrangements: Alone   Type of Home: House Home Access: Ramped entrance     Home Layout: One level Home Equipment: Environmental consultant - 2 wheels;Wheelchair - manual;Cane - single point;Shower seat;Bedside commode;Adaptive equipment      Prior Function Level of Independence: Independent with assistive device(s)         Comments: walks with a cane and drives     Hand Dominance   Dominant Hand: Right    Extremity/Trunk Assessment   Upper Extremity Assessment Upper Extremity Assessment: Overall WFL for tasks assessed    Lower Extremity Assessment Lower Extremity Assessment: Overall WFL for tasks assessed    Cervical / Trunk Assessment Cervical / Trunk Assessment: Other exceptions Cervical / Trunk Exceptions: body habitus  Communication   Communication: No difficulties  Cognition Arousal/Alertness: Awake/alert Behavior During Therapy: WFL for tasks assessed/performed Overall Cognitive Status: Within Functional Limits for tasks assessed                                        General Comments  Exercises     Assessment/Plan    PT Assessment Patent does not need any further PT services  PT Problem List         PT Treatment Interventions      PT Goals (Current goals can be found in the Care Plan section)  Acute Rehab PT Goals Patient Stated Goal: return home PT Goal Formulation: All assessment and education complete, DC therapy    Frequency     Barriers to discharge        Co-evaluation PT/OT/SLP Co-Evaluation/Treatment: Yes Reason for Co-Treatment: To address functional/ADL transfers PT goals addressed during session: Mobility/safety with mobility;Balance;Proper use of DME;Strengthening/ROM         AM-PAC PT "6 Clicks" Mobility  Outcome Measure Help needed turning from your back to your side while in a flat  bed without using bedrails?: None Help needed moving from lying on your back to sitting on the side of a flat bed without using bedrails?: None Help needed moving to and from a bed to a chair (including a wheelchair)?: None Help needed standing up from a chair using your arms (e.g., wheelchair or bedside chair)?: None Help needed to walk in hospital room?: None Help needed climbing 3-5 steps with a railing? : A Little 6 Click Score: 23    End of Session   Activity Tolerance: Patient tolerated treatment well Patient left: in chair;with call bell/phone within reach Nurse Communication: Mobility status PT Visit Diagnosis: Other abnormalities of gait and mobility (R26.89)    Time: 1346-1401 PT Time Calculation (min) (ACUTE ONLY): 15 min   Charges:   PT Evaluation $PT Eval Low Complexity: [place], PT, DPT  Acute Rehabilitation Services Pager [phone] Office [place] 11/18/2018, 4:36 PM

## 2018-11-19 LAB — BASIC METABOLIC PANEL
Anion gap: 12 (ref 5–15)
BUN: 18 mg/dL (ref 8–23)
CO2: 24 mmol/L (ref 22–32)
Calcium: 9.2 mg/dL (ref 8.9–10.3)
Chloride: 98 mmol/L (ref 98–111)
Creatinine, Ser: 0.86 mg/dL (ref 0.44–1.00)
GFR calc Af Amer: >60 mL/min (ref >60)
GFR calc non Af Amer: >60 mL/min (ref >60)
Glucose, Bld: 218 mg/dL — ABNORMAL HIGH (ref 70–99)
Potassium: 3.8 mmol/L (ref 3.5–5.1)
Sodium: 134 mmol/L — ABNORMAL LOW (ref 135–145)

## 2018-11-19 LAB — GLUCOSE, CAPILLARY
Glucose-Capillary: 217 mg/dL — ABNORMAL HIGH (ref 70–99)
Glucose-Capillary: 195 mg/dL — ABNORMAL HIGH (ref 70–99)

## 2018-11-19 MED ORDER — INSULIN GLARGINE 100 UNIT/ML ~~LOC~~ SOLN
24.0000 [IU] | Freq: Every day | SUBCUTANEOUS | Status: DC
  Administered 2018-11-19: 11:00:00 24 [IU] via SUBCUTANEOUS
  Filled 2018-11-19: qty 0.24

## 2018-11-19 MED ORDER — GLIMEPIRIDE 4 MG PO TABS: 4.0000 mg | ORAL_TABLET | ORAL | 11 refills | Status: DC

## 2018-11-19 MED ORDER — CEPHALEXIN 500 MG PO CAPS: 500.0000 mg | ORAL_CAPSULE | Freq: Three times a day (TID) | ORAL | 0 refills | Status: AC

## 2018-11-19 MED FILL — GLIMEPIRIDE 2 MG TABLET: 2 | 30 days supply | Qty: 60 | Fill #0

## 2018-11-19 MED FILL — CEPHALEXIN 500 MG CAPSULE: 500 | 5 days supply | Qty: 15 | Fill #0

## 2018-11-19 NOTE — Progress Notes (Signed)
The patient will be discharged to her home.  Discharge instructions reviewed with the patient.  The patient verbalizes understanding those instructions.  A discharge packet was provided.

## 2018-11-19 NOTE — Discharge Summary (Signed)
Physician Discharge Summary  Amanda Butler VFI:[id number] DOB: 1955-11-12 DOA: 11/16/2018  PCP: Amanda Arabian, MD  Admit date: 11/16/2018 Discharge date: 11/19/2018  Time spent: 40 minutes  Recommendations for Outpatient Follow-up:  1. Follow up with PCP 1-2 weeks for evaluation of cellulitis and diabetes control 2. HH RN for assistance with dressing changes   Discharge Diagnoses:  Principal Problem:   Left leg cellulitis Active Problems:   HTN (hypertension)   Stasis dermatitis of both legs   Chronic diastolic CHF (congestive heart failure) (Amanda Butler)   Morbid obesity due to excess calories (Amanda Butler)   Type II diabetes mellitus (Amanda Butler)   Cellulitis of left leg   Discharge Condition: stable  Diet recommendation: heart healthy carb modified  Filed Weights   11/16/18 0915  Weight: 127 kg    History of present illness:  Amanda Butler is a 63 y.o. female with medical history significant of hypertension, diabetes mellitus type 2, diastolic CHF, recurrent cellulitis of the left leg; who presented 8/4 with reports of leg swelling and drainage.  Symptoms initially started 4 days prior after she removed compression stockings.   Seen in urgent care 2 days prior, treated with 1 dose of Rocephin IM, and given a prescription sent for doxycycline.  Patient noted that she had not taken any of the doxycycline that was prescribed.  Complained of increased redness, warmth, and drainage which started in the previous day.  Denied having any fevers, chills, nausea, vomiting, diarrhea, dysuria, or sick contacts.  She noted associated symptoms of nasal congestion, rhinorrhea, and intermittent cough that she related to seasonal allergies.  She stated that her refrigerator had been broken and changes in insurance have made it hard for her to be compliant with her diabetic regimen.  She previously reported being better controlled on Januvia, but was unable to afford it after changing insurance.  Patient related the  lack of her refrigerator to her inability to be on insulin and why she had been eating takeout as the cause of her elevated blood sugars.    Last hospitalized in June for left leg cellulitis after failing 3 rounds of oral antibiotics in outpatient setting.  She was treated with IV vancomycin and Zosyn with improvement of symptoms and discharged home on Keflex.  She reported having follow-up with wound care prior to symptoms improving  Hospital Course:  Recurrentcellulitisof the left leg: Most likely related to bilateral venous stasis changes. Patient reports symptoms began after she removed compression stockings as they were uncomfortable. Seen in urgent care and given IM rocephin and a prescription for doxy which she did not fill. Hospitalized in [place] failed outpatient treatment.she remained afebrile and non-toxic appearing. CRP and sed rate within limits of normal. Blood cultures with no growth to date.  provided with IV vanc and rocephin. Improved at discharge. Will discharge with keflex and doxy and HH RN toassist with dressing changes. :Recommend washing of LE with soap and water, rinsing lesions with NS and gently patting dry. Cover affected area with xeroform gauze (folded), top with ABD and secure with Kerlix roll gauze/paper tape. Change daily.ELevate LEs while in bed.   Diabetes mellitus type 2, with hyperglycemia:uncontrolled.Last hemoglobin A1c noted to be 9.2 on 11/17/18. Patient presentedwith glucose elevated up to 416 without significant anion gap. Home medications include Amaryl and Prandin. lantus started. Evaluated by diabetes coordinator who recommends Lantus 20u daily and meal coverage with SS. Patient did share an unwillingness to perform injections. Planned to discharge with Metformin but  patient "allergic". Will increase her current glimepriride to 4mg  and continue prandin. Close OP follow up  Hyponatremia: Acute.Mild.resolved.   Allergies vs  NTZ:GYFVCBS reports having cough, intermittent wheezing, and nasal congestion that she relates to allergies. Denied any fever or recent sick contacts.COVID-19 negative. Chest xray without cardio pulm process - Essential hypertension:  controlled. Homemedications include metoprolol and triamterene-hydrochlorothiazide and lisinopril.   Diastolic congestive heart failure:compensated.last EF noted be 55 to 60% with grade 2 diastolic dysfunction..  Hyperlipidemia -Continue pravastatin  Procedures:    Consultations:  Diabetes coordinator  Discharge Exam: Vitals:   11/19/18 0837 11/19/18 0953  BP: 135/62 124/62  Pulse: 61 60  Resp:    Temp:  98.3 F (36.8 C)  SpO2:  94%    General: sitting up in chair eating. No acute distress Cardiovascular: rrr no mgr no LE edema Respiratory: normal effort BS clear Skin: Left lower extremity dressing is clean and dry. No odor.   Discharge Instructions   Discharge Instructions    Call MD for:  temperature >100.4   Complete by: As directed    Diet - low sodium heart healthy   Complete by: As directed    Discharge instructions   Complete by: As directed    Take medications as directed Monitor blood sugar 4 times daily. Document all readings each day. Take this data to follow up appointment with PCP Daily dressing changes as instructed Keep left leg elevated as much as possible   Increase activity slowly   Complete by: As directed      Allergies as of 11/19/2018      Reactions   Bee Venom Anaphylaxis   Iodine Shortness Of Breath, Rash   Shellfish Allergy Anaphylaxis   Actos [pioglitazone] Other (See Comments)   Erratic heartbeat   Aspirin Hives   Latex Hives   Metformin And Related Other (See Comments)   Patient reports flu-like symptoms and fatigue   Morphine And Related Other (See Comments)   Childhood allergy (63 years old) , pt got sicker , temp went up, dr gave her the wrong medication   Nsaids Hives   Ciprofloxacin  Palpitations   Joint pain      Medication List    TAKE these medications   acetaminophen 325 MG tablet Commonly known as: TYLENOL Take 650 mg by mouth every 6 (six) hours as needed for moderate pain.   albuterol 108 (90 Base) MCG/ACT inhaler Commonly known as: VENTOLIN HFA Inhale 2 puffs into the lungs 2 (two) times daily as needed for wheezing or shortness of breath.   albuterol (2.5 MG/3ML) 0.083% nebulizer solution Commonly known as: PROVENTIL Take 2.5 mg by nebulization every 6 (six) hours as needed for wheezing or shortness of breath.   cephALEXin 500 MG capsule Commonly known as: KEFLEX Take 1 capsule (500 mg total) by mouth every 8 (eight) hours for 5 days.   doxycycline 100 MG tablet Commonly known as: VIBRA-TABS Take 1 tablet (100 mg total) by mouth 2 (two) times daily.   famotidine 10 MG tablet Commonly known as: PEPCID Take 10 mg by mouth daily as needed for heartburn.   glimepiride 4 MG tablet Commonly known as: Amaryl Take 1 tablet (4 mg total) by mouth every morning. What changed:   medication strength  how much to take  when to take this   lisinopril 10 MG tablet Commonly known as: ZESTRIL Take 1 tablet (10 mg total) by mouth daily.   metoprolol succinate 50 MG 24 hr tablet Commonly  known as: TOPROL-XL Take 50 mg by mouth daily. Take with or immediately following a meal.   mometasone 50 MCG/ACT nasal spray Commonly known as: NASONEX Place 2 sprays into the nose daily.   pravastatin 20 MG tablet Commonly known as: PRAVACHOL Take 20 mg by mouth daily.   repaglinide 1 MG tablet Commonly known as: PRANDIN Take 1 mg by mouth See admin instructions. Take 1 tablet 15 to 30 mins before meals once a day for diabetes   triamterene-hydrochlorothiazide 75-50 MG tablet Commonly known as: MAXZIDE Take 1 tablet by mouth daily.      Allergies  Allergen Reactions  . Bee Venom Anaphylaxis  . Iodine Shortness Of Breath and Rash  . Shellfish Allergy  Anaphylaxis  . Actos [Pioglitazone] Other (See Comments)    Erratic heartbeat  . Aspirin Hives  . Latex Hives  . Metformin And Related Other (See Comments)    Patient reports flu-like symptoms and fatigue  . Morphine And Related Other (See Comments)    Childhood allergy (63 years old) , pt got sicker , temp went up, dr gave her the wrong medication  . Nsaids Hives  . Ciprofloxacin Palpitations    Joint pain      The results of significant diagnostics from this hospitalization (including imaging, microbiology, ancillary and laboratory) are listed below for reference.    Significant Diagnostic Studies: Dg Chest Port 1 View  Result Date: 11/16/2018 CLINICAL DATA:  63 year old female with history of left leg swelling since Saturday. Cough. EXAM: PORTABLE CHEST 1 VIEW COMPARISON:  Chest x-ray 07/04/2018. FINDINGS: Lung volumes are normal. No consolidative airspace disease. No pleural effusions. No pneumothorax. No pulmonary nodule or mass noted. Pulmonary vasculature and the cardiomediastinal silhouette are within normal limits. Aortic atherosclerosis. IMPRESSION: 1.  No radiographic evidence of acute cardiopulmonary disease. 2. Aortic atherosclerosis. Electronically Signed   By: Vinnie Langton M.D.   On: 11/16/2018 17:12    Microbiology: Recent Results (from the past 240 hour(s))  Blood Culture (routine x 2)     Status: None (Preliminary result)   Collection Time: 11/16/18 11:17 AM   Specimen: BLOOD RIGHT HAND  Result Value Ref Range Status   Specimen Description BLOOD RIGHT HAND  Final   Special Requests   Final    BOTTLES DRAWN AEROBIC AND ANAEROBIC Blood Culture adequate volume   Culture   Final    NO GROWTH 3 DAYS Performed at [facility], [place] [address] [place], [place] [address]    Report Status PENDING  Incomplete  Blood Culture (routine x 2)     Status: None (Preliminary result)   Collection Time: 11/16/18 11:43 AM   Specimen: BLOOD RIGHT ARM  Result Value Ref  Range Status   Specimen Description BLOOD RIGHT ARM  Final   Special Requests   Final    BOTTLES DRAWN AEROBIC AND ANAEROBIC Blood Culture adequate volume   Culture   Final    NO GROWTH 3 DAYS Performed at [facility], [place] [address] [place], [place] [address]    Report Status PENDING  Incomplete  SARS CORONAVIRUS 2 Nasal Swab Aptima Multi Swab     Status: None   Collection Time: 11/16/18 12:13 PM   Specimen: Aptima Multi Swab; Nasal Swab  Result Value Ref Range Status   SARS Coronavirus 2 NEGATIVE NEGATIVE Final    Comment: (NOTE) SARS-CoV-2 target nucleic acids are NOT DETECTED. The SARS-CoV-2 RNA is generally detectable in upper and lower respiratory specimens during the  acute phase of infection. Negative results do not preclude SARS-CoV-2 infection, do not rule out co-infections with other pathogens, and should not be used as the sole basis for treatment or other patient management decisions. Negative results must be combined with clinical observations, patient history, and epidemiological information. The expected result is Negative. Fact Sheet for Patients: SugarRoll.be Fact Sheet for Healthcare Providers: https://www.woods-mathews.com/ This test is not yet approved or cleared by the [place] FDA and  has been authorized for detection and/or diagnosis of SARS-CoV-2 by FDA under an Emergency Use Authorization (EUA). This EUA will remain  in effect (meaning this test can be used) for the duration of the COVID-19 declaration under Section 56 4(b)(1) of the Act, 21 U.S.C. section 360bbb-3(b)(1), unless the authorization is terminated or revoked sooner. Performed at [facility], [place] [address], [place], [place] [address]   MRSA PCR Screening     Status: Abnormal   Collection Time: 11/17/18  3:12 PM   Specimen: Nasal Mucosa; Nasopharyngeal  Result Value Ref Range Status   MRSA by PCR POSITIVE (A) NEGATIVE Final     Comment:        The GeneXpert MRSA Assay (FDA approved for NASAL specimens only), is one component of a comprehensive MRSA colonization surveillance program. It is not intended to diagnose MRSA infection nor to guide or monitor treatment for MRSA infections. RESULT CALLED TO, READ BACK BY AND VERIFIED WITH: Leonides Grills RN 17:15 11/17/18 ([email or web address]) Performed at [facility], [place] [address] [place], [place] [address]      Labs: Basic Metabolic Panel: Recent Labs  Lab 11/16/18 [phone] 11/17/18 0250 11/18/18 0544 11/19/18 0235  NA 134* 137 132* 134*  K 3.6 3.5 4.3 3.8  CL 98 101 97* 98  CO2 23 23 24 24   GLUCOSE 416* 224* 226* 218*  BUN 10 9 13 18   CREATININE 0.89 0.73 0.85 0.86  CALCIUM 8.8* 8.7* 9.0 9.2   Liver Function Tests: No results for input(s): AST, ALT, ALKPHOS, BILITOT, PROT, ALBUMIN in the last 168 hours. No results for input(s): LIPASE, AMYLASE in the last 168 hours. No results for input(s): AMMONIA in the last 168 hours. CBC: Recent Labs  Lab 11/16/18 0948 11/17/18 0250 11/18/18 0544  WBC 4.2 6.4 6.5  NEUTROABS 3.1 4.2 4.4  HGB 14.4 13.5 14.6  HCT 42.5 40.3 43.8  MCV 86.2 86.3 88.0  PLT 162 155 175   Cardiac Enzymes: No results for input(s): CKTOTAL, CKMB, CKMBINDEX, TROPONINI in the last 168 hours. BNP: BNP (last 3 results) Recent Labs    02/19/18 2056  BNP 88.0    ProBNP (last 3 results) No results for input(s): PROBNP in the last 8760 hours.  CBG: Recent Labs  Lab 11/18/18 0731 11/18/18 1112 11/18/18 1622 11/18/18 2200 11/19/18 0745  GLUCAP 216* 251* 225* 192* 217*       Signed:  Radene Gunning NP [facility] 11/19/2018, 11:57 AM

## 2018-11-21 LAB — CULTURE, BLOOD (ROUTINE X 2)
Culture: NO GROWTH
Culture: NO GROWTH
Special Requests: ADEQUATE
Special Requests: ADEQUATE

## 2019-02-24 ENCOUNTER — Ambulatory Visit: Payer: Medicare Other | Admitting: Dietician

## 2019-03-15 ENCOUNTER — Ambulatory Visit: Payer: Medicare Other | Admitting: Cardiovascular Disease

## 2019-03-28 ENCOUNTER — Other Ambulatory Visit: Payer: Self-pay

## 2019-03-28 ENCOUNTER — Encounter (HOSPITAL_BASED_OUTPATIENT_CLINIC_OR_DEPARTMENT_OTHER): Payer: Medicare Other | Attending: Internal Medicine | Admitting: Internal Medicine

## 2019-03-28 DIAGNOSIS — I252 Old myocardial infarction: Secondary | ICD-10-CM | POA: Diagnosis not present

## 2019-03-28 DIAGNOSIS — I890 Lymphedema, not elsewhere classified: Secondary | ICD-10-CM | POA: Insufficient documentation

## 2019-03-28 DIAGNOSIS — Z6841 Body Mass Index (BMI) 40.0 and over, adult: Secondary | ICD-10-CM | POA: Insufficient documentation

## 2019-03-28 DIAGNOSIS — X58XXXA Exposure to other specified factors, initial encounter: Secondary | ICD-10-CM | POA: Insufficient documentation

## 2019-03-28 DIAGNOSIS — S91311A Laceration without foreign body, right foot, initial encounter: Secondary | ICD-10-CM | POA: Diagnosis not present

## 2019-03-28 DIAGNOSIS — I509 Heart failure, unspecified: Secondary | ICD-10-CM | POA: Diagnosis not present

## 2019-03-28 DIAGNOSIS — E11622 Type 2 diabetes mellitus with other skin ulcer: Secondary | ICD-10-CM | POA: Diagnosis present

## 2019-03-28 DIAGNOSIS — E1140 Type 2 diabetes mellitus with diabetic neuropathy, unspecified: Secondary | ICD-10-CM | POA: Diagnosis not present

## 2019-03-28 DIAGNOSIS — I110 Hypertensive heart disease with heart failure: Secondary | ICD-10-CM | POA: Diagnosis not present

## 2019-03-28 DIAGNOSIS — L97221 Non-pressure chronic ulcer of left calf limited to breakdown of skin: Secondary | ICD-10-CM | POA: Diagnosis not present

## 2019-03-28 DIAGNOSIS — I87332 Chronic venous hypertension (idiopathic) with ulcer and inflammation of left lower extremity: Secondary | ICD-10-CM | POA: Insufficient documentation

## 2019-03-28 NOTE — Progress Notes (Addendum)
Amanda Butler, Amanda Butler ([id number]) Visit Report for 03/28/2019 Allergy List Details Patient Name: Date of Service: Amanda Butler, Amanda Butler 03/28/2019 1:15 PM Medical Record [id number] Patient Account Number: [id number] Date of Birth/Sex: Treating RN: Feb 01, 1956 (63 y.o. Amanda Butler Primary Care Mayara Paulson: Simona Huh Other Clinician: Referring Avalina Benko: Treating Mashell Sieben/Extender:Robson, Vivi Barrack, Melvern Sample in Treatment: 0 Allergies Active Allergies aspirin latex NSAIDS (Non-Steroidal Anti-Inflammatory Drug) iodine bee venom protein (honey bee) shellfish derived morphine metformin clindamycin Reaction: hives Severity: Moderate Allergy Notes Electronic Signature(s) Signed: 03/28/2019 5:56:42 PM By: Kela Millin Entered By: Kela Millin on 03/28/2019 14:00:51 -------------------------------------------------------------------------------- Arrival Information Details Patient Name: Date of Service: Amanda Butler 03/28/2019 1:15 PM Medical Record [id number] Patient Account Number: [id number] Date of Birth/Sex: Treating RN: 12-11-1955 (63 y.o. Amanda Butler Primary Care Aydden Cumpian: Other Clinician: Simona Huh Referring Piero Mustard: Treating Dawson Hollman/Extender:Robson, Vivi Barrack, Melvern Sample in Treatment: 0 Visit Information Patient Arrived: Walker Arrival Time: 13:56 Accompanied By: self Transfer Assistance: None Patient Identification Verified: Yes Secondary Verification Process Yes Completed: Patient Requires Transmission- No Based Precautions: Patient Has Alerts: Yes Patient Alerts: unable to obtain ABI-pain History Since Last Visit Added or deleted any medications: No Any new allergies or adverse reactions: No Had a fall or experienced change in activities of daily living that may affect risk of falls: No Signs or symptoms of abuse/neglect since last visito No Hospitalized since last visit:  No Implantable device outside of the clinic excluding cellular tissue based products placed in the center since last visit: No Electronic Signature(s) Signed: 03/28/2019 6:02:21 PM By: Levan Hurst RN, BSN Entered By: Levan Hurst on 03/28/2019 15:00:08 -------------------------------------------------------------------------------- Clinic Level of Care Assessment Details Patient Name: Date of Service: Amanda Butler, Amanda Butler 03/28/2019 1:15 PM Medical Record [id number] Patient Account Number: [id number] Date of Birth/Sex: Treating RN: [birth year] (62 y.o. Amanda Butler Primary Care Denny Mccree: Simona Huh Other Clinician: Referring Abir Eroh: Treating Seline Enzor/Extender:Robson, Vivi Barrack, Melvern Sample in Treatment: 0 Clinic Level of Care Assessment Items TOOL 1 Quantity Score X - Use when EandM and Procedure is performed on INITIAL visit 1 0 ASSESSMENTS - Nursing Assessment / Reassessment X - General Physical Exam (combine w/ comprehensive assessment (listed just below) 1 20 when performed on new pt. evals) X - Comprehensive Assessment (HX, ROS, Risk Assessments, Wounds Hx, etc.) 1 25 ASSESSMENTS - Wound and Skin Assessment / Reassessment []  - Dermatologic / Skin Assessment (not related to wound area) 0 ASSESSMENTS - Ostomy and/or Continence Assessment and Care []  - Incontinence Assessment and Management 0 []  - Ostomy Care Assessment and Management (repouching, etc.) 0 PROCESS - Coordination of Care X - Simple Patient / Family Education for ongoing care 1 15 []  - Complex (extensive) Patient / Family Education for ongoing care 0 X - Staff obtains Programmer, systems, Records, Test Results / Process Orders 1 10 []  - Staff telephones HHA, Nursing Homes / Clarify orders / etc 0 []  - Routine Transfer to another Facility (non-emergent condition) 0 []  - Routine Hospital Admission (non-emergent condition) 0 X - New Admissions / Biomedical engineer / Ordering NPWT, Apligraf,  etc. 1 15 []  - Emergency Hospital Admission (emergent condition) 0 PROCESS - Special Needs []  - Pediatric / Minor Patient Management 0 []  - Isolation Patient Management 0 []  - Hearing / Language / Visual special needs 0 []  - Assessment of Community assistance (transportation, D/C planning, etc.) 0 []  - Additional assistance / Altered mentation 0 []  - Support Surface(s) Assessment (bed, cushion, seat, etc.) 0 INTERVENTIONS -  Miscellaneous []  - External ear exam 0 []  - Patient Transfer (multiple staff / Harrel Lemon Lift / Similar devices) 0 []  - Simple Staple / Suture removal (25 or less) 0 []  - Complex Staple / Suture removal (26 or more) 0 []  - Hypo/Hyperglycemic Management (do not check if billed separately) 0 []  - Ankle / Brachial Index (ABI) - do not check if billed separately 0 Has the patient been seen at the hospital within the last three years: Yes Total Score: 85 Level Of Care: New/Established - Level 3 Electronic Signature(s) Signed: 03/28/2019 6:02:21 PM By: Levan Hurst RN, BSN Entered By: Levan Hurst on 03/28/2019 17:53:50 -------------------------------------------------------------------------------- Compression Therapy Details Patient Name: Date of Service: Amanda Butler 03/28/2019 1:15 PM Medical Record [id number] Patient Account Number: [id number] Date of Birth/Sex: Treating RN: 06-23-1955 (63 y.o. Amanda Butler Primary Care Yaileen Hofferber: Simona Huh Other Clinician: Referring Cindra Austad: Treating Jb Dulworth/Extender:Robson, Vivi Barrack, Melvern Sample in Treatment: 0 Compression Therapy Performed for Wound Wound #4 Left,Lateral Lower Leg Assessment: Performed By: Clinician Levan Hurst, RN Compression Type: Three Layer Post Procedure Diagnosis Same as Pre-procedure Electronic Signature(s) Signed: 03/28/2019 6:02:21 PM By: Levan Hurst RN, BSN Entered By: Levan Hurst on 03/28/2019  14:58:27 -------------------------------------------------------------------------------- Encounter Discharge Information Details Patient Name: Date of Service: Amanda Butler 03/28/2019 1:15 PM Medical Record [id number] Patient Account Number: [id number] Date of Birth/Sex: Treating RN: [birth year] (63 y.o. Amanda Butler Primary Care Marcin Holte: Simona Huh Other Clinician: Referring Lillianne Eick: Treating Jame Seelig/Extender:Robson, Vivi Barrack, Melvern Sample in Treatment: 0 Encounter Discharge Information Items Discharge Condition: Stable Ambulatory Status: Walker Discharge Destination: Home Transportation: Private Auto Accompanied By: self Schedule Follow-up Appointment: Yes Clinical Summary of Care: Electronic Signature(s) Signed: 03/28/2019 5:58:58 PM By: Deon Pilling Entered By: Deon Pilling on 03/28/2019 16:33:36 -------------------------------------------------------------------------------- Lower Extremity Assessment Details Patient Name: Date of Service: Amanda Butler, Amanda Butler 03/28/2019 1:15 PM Medical Record [id number] Patient Account Number: [id number] Date of Birth/Sex: Treating RN: 10/23/1955 (63 y.o. Amanda Butler Primary Care Kolt Mcwhirter: Simona Huh Other Clinician: Referring Raaga Maeder: Treating Tracye Szuch/Extender:Robson, Vivi Barrack, Melvern Sample in Treatment: 0 Edema Assessment Assessed: [Left: No] [Right: No] Edema: [Left: Ye] [Right: s] Calf Left: Right: Point of Measurement: 33 cm From Medial Instep 41 cm cm Ankle Left: Right: Point of Measurement: 11 cm From Medial Instep 31 cm cm Vascular Assessment Pulses: Dorsalis Pedis Palpable: [Left:Yes] Electronic Signature(s) Signed: 03/28/2019 5:56:42 PM By: Kela Millin Entered By: Kela Millin on 03/28/2019 14:12:26 -------------------------------------------------------------------------------- Multi Wound Chart Details Patient Name: Date of  Service: Amanda Butler 03/28/2019 1:15 PM Medical Record [id number] Patient Account Number: [id number] Date of Birth/Sex: Treating RN: [birth year] (63 y.o. F) Primary Care Aarin Bluett: Simona Huh Other Clinician: Referring Anika Shore: Treating Jaxzen Vanhorn/Extender:Robson, Vivi Barrack, Melvern Sample in Treatment: 0 Vital Signs Height(in): 66 Pulse(bpm): 89 Weight(lbs): 258 Blood Pressure(mmHg): 188/82 Body Mass Index(BMI): 42 Temperature(F): 98.0 Respiratory 19 Rate(breaths/min): Photos: [4:No Photos] [N/A:N/A] Wound Location: [4:Left Lower Leg - Lateral] [N/A:N/A] Wounding Event: [4:Gradually Appeared] [N/A:N/A] Primary Etiology: [4:Lymphedema] [N/A:N/A] Comorbid History: [4:Chronic sinus problems/congestion, Lymphedema, Asthma, Congestive Heart Failure, Hypertension, Myocardial Infarction, Type II Diabetes, Osteoarthritis, Neuropathy, Confinement Anxiety] [N/A:N/A] Date Acquired: [4:01/13/2019] [N/A:N/A] Weeks of Treatment: [4:0] [N/A:N/A] Wound Status: [4:Open] [N/A:N/A] Measurements L x W x D 17x24x0.1 [N/A:N/A] (cm) Area (cm) : [4:320.442] [N/A:N/A] Volume (cm) : [4:32.044] [N/A:N/A] Classification: [4:Full Thickness Without Exposed Support Structures] [N/A:N/A] Exudate Amount: [4:Large] [N/A:N/A] Exudate Type: [4:Serous] [N/A:N/A] Exudate Color: [4:amber] [N/A:N/A] Wound Margin: [4:Distinct, outline attached N/A] Granulation Amount: [4:Large (67-100%)] [  N/A:N/A] Granulation Quality: [4:Pink, Pale] [N/A:N/A] Necrotic Amount: [4:Small (1-33%)] [N/A:N/A] Exposed Structures: [4:Fat Layer (Subcutaneous N/A Tissue) Exposed: Yes Fascia: No Tendon: No Muscle: No Joint: No Bone: No] Epithelialization: [4:None] [N/A:N/A N/A] Treatment Notes Electronic Signature(s) Signed: 03/28/2019 5:56:50 PM By: Linton Ham MD Entered By: Linton Ham on 03/28/2019  15:04:55 -------------------------------------------------------------------------------- Multi-Disciplinary Care Plan Details Patient Name: Date of Service: Amanda Butler 03/28/2019 1:15 PM Medical Record [id number] Patient Account Number: [id number] Date of Birth/Sex: Treating RN: March 09, 1956 (63 y.o. Amanda Butler Primary Care Tonika Eden: Simona Huh Other Clinician: Referring Laresa Oshiro: Treating Jaydian Santana/Extender:Robson, Vivi Barrack, Melvern Sample in Treatment: 0 Active Inactive Nutrition Nursing Diagnoses: Impaired glucose control: actual or potential Potential for alteratiion in Nutrition/Potential for imbalanced nutrition Goals: Patient/caregiver agrees to and verbalizes understanding of need to use nutritional supplements and/or vitamins as prescribed Date Initiated: 03/28/2019 Target Resolution Date: 04/29/2019 Goal Status: Active Patient/caregiver will maintain therapeutic glucose control Date Initiated: 03/28/2019 Target Resolution Date: 04/29/2019 Goal Status: Active Interventions: Assess HgA1c results as ordered upon admission and as needed Assess patient nutrition upon admission and as needed per policy Provide education on elevated blood sugars and impact on wound healing Provide education on nutrition Notes: Venous Leg Ulcer Nursing Diagnoses: Actual venous Insuffiency (use after diagnosis is confirmed) Knowledge deficit related to disease process and management Goals: Patient will maintain optimal edema control Date Initiated: 03/28/2019 Target Resolution Date: 04/29/2019 Goal Status: Active Patient/caregiver will verbalize understanding of disease process and disease management Date Initiated: 03/28/2019 Target Resolution Date: 04/29/2019 Goal Status: Active Interventions: Assess peripheral edema status every visit. Compression as ordered Provide education on venous insufficiency Notes: Wound/Skin Impairment Nursing  Diagnoses: Impaired tissue integrity Knowledge deficit related to ulceration/compromised skin integrity Goals: Patient/caregiver will verbalize understanding of skin care regimen Date Initiated: 03/28/2019 Target Resolution Date: 04/29/2019 Goal Status: Active Ulcer/skin breakdown will have a volume reduction of 30% by week 4 Date Initiated: 03/28/2019 Target Resolution Date: 04/29/2019 Goal Status: Active Interventions: Assess patient/caregiver ability to obtain necessary supplies Assess patient/caregiver ability to perform ulcer/skin care regimen upon admission and as needed Assess ulceration(s) every visit Provide education on ulcer and skin care Notes: Electronic Signature(s) Signed: 03/28/2019 6:02:21 PM By: Levan Hurst RN, BSN Entered By: Levan Hurst on 03/28/2019 17:53:02 -------------------------------------------------------------------------------- Pain Assessment Details Patient Name: Date of Service: Amanda Butler 03/28/2019 1:15 PM Medical Record [id number] Patient Account Number: [id number] Date of Birth/Sex: Treating RN: [birth year] (63 y.o. Amanda Butler Primary Care Jaelyn Cloninger: Simona Huh Other Clinician: Referring Alecxander Mainwaring: Treating Alexia Dinger/Extender:Robson, Vivi Barrack, Melvern Sample in Treatment: 0 Active Problems Location of Pain Severity and Description of Pain Patient Has Paino Yes Site Locations Pain Location: Pain in Ulcers With Dressing Change: Yes Duration of the Pain. Constant / Intermittento Constant Rate the pain. Current Pain Level: 6 Character of Pain Describe the Pain: Throbbing Pain Management and Medication Current Pain Management: Medication: No Cold Application: No Rest: No Massage: No Activity: No T.E.N.S.: No Heat Application: No Leg drop or elevation: No Is the Current Pain Management Adequate: Adequate How does your wound impact your activities of daily livingo Sleep: No Bathing:  No Appetite: No Relationship With Others: No Bladder Continence: No Emotions: No Bowel Continence: No Work: No Toileting: No Drive: No Dressing: No Hobbies: No Electronic Signature(s) Signed: 03/28/2019 6:02:21 PM By: Levan Hurst RN, BSN Entered By: Levan Hurst on 03/28/2019 14:59:08 -------------------------------------------------------------------------------- Patient/Caregiver Education Details Patient Name: Date of Service: Amanda Butler 12/14/2020andnbsp1:15 PM Medical Record [id number] Patient Account Number:  [id number] Date of Birth/Gender: Treating RN: April 04, 1956 (63 y.o. Amanda Butler Primary Care Physician: Simona Huh Other Clinician: Referring Physician: Treating Physician/Extender:Robson, Vivi Barrack, Melvern Sample in Treatment: 0 Education Assessment Education Provided To: Patient Education Topics Provided Elevated Blood Sugar/ Impact on Healing: Methods: Explain/Verbal Responses: State content correctly Nutrition: Methods: Explain/Verbal Responses: State content correctly Venous: Methods: Explain/Verbal Responses: State content correctly Wound/Skin Impairment: Methods: Explain/Verbal Responses: State content correctly Electronic Signature(s) Signed: 03/28/2019 6:02:21 PM By: Levan Hurst RN, BSN Entered By: Levan Hurst on 03/28/2019 17:53:18 -------------------------------------------------------------------------------- Wound Assessment Details Patient Name: Date of Service: Amanda Butler 03/28/2019 1:15 PM Medical Record [id number] Patient Account Number: [id number] Date of Birth/Sex: Treating RN: 1955-08-07 (62 y.o. Amanda Butler Primary Care Jyla Hopf: Simona Huh Other Clinician: Referring Naziyah Tieszen: Treating Ermal Haberer/Extender:Robson, Vivi Barrack, Melvern Sample in Treatment: 0 Wound Status Wound Number: 4 Primary Lymphedema Etiology: Wound Location: Left Lower Leg -  Lateral Wound Open Wounding Event: Gradually Appeared Status: Date Acquired: 01/13/2019 Comorbid Chronic sinus problems/congestion, Weeks Of Treatment: 0 History: Lymphedema, Asthma, Congestive Heart Clustered Wound: No Failure, Hypertension, Myocardial Infarction, Type II Diabetes, Osteoarthritis, Neuropathy, Confinement Anxiety Photos Wound Measurements Length: (cm) 17 % Reduc Width: (cm) 24 % Reduc Depth: (cm) 0.1 Epithel Area: (cm) 320.442 Tunnel Volume: (cm) 32.044 Underm Wound Description Classification: Full Thickness Without Exposed Support Foul O Structures Slough Wound Distinct, outline attached Margin: Exudate Large Amount: Exudate Serous Type: Exudate amber Color: Wound Bed Granulation Amount: Large (67-100%) Granulation Quality: Pink, Pale Fascia Necrotic Amount: Small (1-33%) Fat Lay Necrotic Quality: Adherent Slough Tendon Muscle Joint E Bone E dor After Cleansing: No /Fibrino Yes Exposed Structure Exposed: No er (Subcutaneous Tissue) Exposed: Yes Exposed: No Exposed: No xposed: No xposed: No tion in Area: 0% tion in Volume: 0% ialization: None ing: No ining: No Treatment Notes Wound #4 (Left, Lateral Lower Leg) 1. Cleanse With Wound Cleanser Soap and water 2. Periwound Care Moisturizing lotion TCA Cream 3. Primary Dressing Applied Calcium Alginate Ag 4. Secondary Dressing ABD Pad Kerramax/Xtrasorb 6. Support Layer Applied 3 layer compression wrap Notes netting. explained the orders, dressings, frequency of change, and when to return to wound center. Electronic Signature(s) Signed: 03/29/2019 3:57:04 PM By: Mikeal Hawthorne EMT/HBOT Signed: 03/31/2019 5:33:40 PM By: Kela Millin Previous Signature: 03/28/2019 5:56:42 PM Version By: Kela Millin Entered By: Mikeal Hawthorne on 03/29/2019 14:39:17 -------------------------------------------------------------------------------- Vitals Details Patient Name: Date of  Service: Amanda Butler 03/28/2019 1:15 PM Medical Record [id number] Patient Account Number: [id number] Date of Birth/Sex: Treating RN: 05/21/1955 (63 y.o. Amanda Butler Primary Care Kristyana Notte: Simona Huh Other Clinician: Referring Amery Vandenbos: Treating Tayloranne Lekas/Extender:Robson, Vivi Barrack, Melvern Sample in Treatment: 0 Vital Signs Time Taken: 13:58 Temperature (F): 98.0 Height (in): 66 Pulse (bpm): 86 Source: Stated Respiratory Rate (breaths/min): 19 Weight (lbs): 258 Blood Pressure (mmHg): 188/82 Source: Stated Reference Range: 80 - 120 mg / dl Body Mass Index (BMI): 41.6 Notes patient stated she has not checked her CBG in a few days Electronic Signature(s) Signed: 03/28/2019 5:56:42 PM By: Kela Millin Entered By: Kela Millin on 03/28/2019 14:00:20

## 2019-03-28 NOTE — Progress Notes (Signed)
[name], [name] ([id number]) Visit Report for 03/28/2019 Abuse/Suicide Risk Screen Details Patient Name: Date of Service: Amanda Butler, Amanda Butler 03/28/2019 1:15 PM Medical Record [id number] Patient Account Number: [id number] Date of Birth/Sex: Treating RN: December 26, 1955 (63 y.o. Clearnce Sorrel Primary Care Mozell Haber: Simona Huh Other Clinician: Referring Severina Sykora: Treating Gerado Nabers/Extender:Robson, Vivi Barrack, Melvern Sample in Treatment: 0 Abuse/Suicide Risk Screen Items Answer ABUSE RISK SCREEN: Has anyone close to you tried to hurt or harm you recentlyo No Do you feel uncomfortable with anyone in your familyo No Has anyone forced you do things that you didnt want to doo No Electronic Signature(s) Signed: 03/28/2019 5:56:42 PM By: Kela Millin Entered By: Kela Millin on 03/28/2019 14:04:25 -------------------------------------------------------------------------------- Activities of Daily Living Details Patient Name: Date of Service: Amanda, Butler 03/28/2019 1:15 PM Medical Record [id number] Patient Account Number: [id number] Date of Birth/Sex: Treating RN: [birth year] (63 y.o. Clearnce Sorrel Primary Care Shakeria Robinette: Simona Huh Other Clinician: Referring Adolfo Granieri: Treating Najwa Spillane/Extender:Robson, Vivi Barrack, Melvern Sample in Treatment: 0 Activities of Daily Living Items Answer Activities of Daily Living (Please select one for each item) Drive Automobile Completely Able Take Medications Completely Able Use Telephone Completely Able Care for Appearance Completely Able Use Toilet Completely Able Bath / Shower Completely Able Dress Self Completely Able Feed Self Completely Able Walk Need Assistance Get In / Out Bed Completely Able Housework Completely Able Prepare Meals Completely [place] for Self Completely Able Electronic Signature(s) Signed: 03/28/2019 5:56:42 PM By: Kela Millin Entered By: Kela Millin on 03/28/2019 14:05:03 -------------------------------------------------------------------------------- Education Screening Details Patient Name: Date of Service: Amanda Butler 03/28/2019 1:15 PM Medical Record [id number] Patient Account Number: [id number] Date of Birth/Sex: Treating RN: [birth year] (63 y.o. Clearnce Sorrel Primary Care Donyel Nester: Simona Huh Other Clinician: Referring Tinea Nobile: Treating Luisantonio Adinolfi/Extender:Robson, Vivi Barrack, Melvern Sample in Treatment: 0 Primary Learner Assessed: Patient Learning Preferences/Education Level/Primary Language Learning Preference: Explanation Highest Education Level: High School Preferred Language: English Cognitive Barrier Language Barrier: No Translator Needed: No Memory Deficit: No Emotional Barrier: No Cultural/Religious Beliefs Affecting Medical Care: No Physical Barrier Impaired Vision: Yes Glasses Impaired Hearing: No Decreased Hand dexterity: No Knowledge/Comprehension Knowledge Level: High Comprehension Level: High Ability to understand written High instructions: Ability to understand verbal High instructions: Motivation Anxiety Level: Calm Cooperation: Cooperative Education Importance: Acknowledges Need Interest in Health Problems: Asks Questions Perception: Coherent Willingness to Engage in Self- High Management Activities: Readiness to Engage in Self- High Management Activities: Electronic Signature(s) Signed: 03/28/2019 5:56:42 PM By: Kela Millin Entered By: Kela Millin on 03/28/2019 14:05:35 -------------------------------------------------------------------------------- Fall Risk Assessment Details Patient Name: Date of Service: Amanda Butler 03/28/2019 1:15 PM Medical Record [id number] Patient Account Number: [id number] Date of Birth/Sex: Treating RN: [birth year] (63 y.o. Clearnce Sorrel Primary Care  Josue Kass: Simona Huh Other Clinician: Referring Nevaeha Finerty: Treating Cejay Cambre/Extender:Robson, Vivi Barrack, Melvern Sample in Treatment: 0 Fall Risk Assessment Items Have you had 2 or more falls in the last 12 monthso 0 No Have you had any fall that resulted in injury in the last 12 monthso 0 No FALLS RISK SCREEN History of falling - immediate or within 3 months 0 No Secondary diagnosis (Do you have 2 or more medical diagnoseso) 15 Yes Ambulatory aid None/bed rest/wheelchair/nurse 0 No Crutches/cane/walker 15 Yes Furniture 0 No Intravenous therapy Access/Saline/Heparin Lock 0 No Weak (short steps with or without shuffle, stooped but able to lift head 10 Yes while walking, may seek support from furniture) Impaired (short  steps with shuffle, may have difficulty arising from chair, 0 No head down, impaired balance) Mental Status Oriented to own ability 0 Yes Overestimates or forgets limitations 0 No Risk Level: Medium Risk Score: 40 Electronic Signature(s) Signed: 03/28/2019 5:56:42 PM By: Kela Millin Entered By: Kela Millin on 03/28/2019 14:06:39 -------------------------------------------------------------------------------- Foot Assessment Details Patient Name: Date of Service: Amanda Butler 03/28/2019 1:15 PM Medical Record [id number] Patient Account Number: [id number] Date of Birth/Sex: Treating RN: 08/11/1955 (63 y.o. Clearnce Sorrel Primary Care Mikiyah Glasner: Simona Huh Other Clinician: Referring Holly Iannaccone: Treating Alixandra Alfieri/Extender:Robson, Vivi Barrack, Melvern Sample in Treatment: 0 Foot Assessment Items Site Locations + = Sensation present, - = Sensation absent, C = Callus, U = Ulcer R = Redness, W = Warmth, M = Maceration, PU = Pre-ulcerative lesion F = Fissure, S = Swelling, D = Dryness Assessment Right: Left: Other Deformity: No No Prior Foot Ulcer: No No Prior Amputation: No No Charcot Joint: No No Ambulatory  Status: Ambulatory With Help Assistance Device: Walker Gait: Steady Electronic Signature(s) Signed: 03/28/2019 5:56:42 PM By: Kela Millin Entered By: Kela Millin on 03/28/2019 14:09:59 -------------------------------------------------------------------------------- Nutrition Risk Screening Details Patient Name: Date of Service: Amanda, Butler 03/28/2019 1:15 PM Medical Record [id number] Patient Account Number: [id number] Date of Birth/Sex: Treating RN: 1955/07/11 (63 y.o. Clearnce Sorrel Primary Care Johannes Everage: Simona Huh Other Clinician: Referring Michelle Vanhise: Treating Oveda Dadamo/Extender:Robson, Vivi Barrack, Melvern Sample in Treatment: 0 Height (in): 66 Weight (lbs): 258 Body Mass Index (BMI): 41.6 Nutrition Risk Screening Items Score Screening NUTRITION RISK SCREEN: I have an illness or condition that made me change the kind and/or 0 No amount of food I eat I eat fewer than two meals per day 0 No I eat few fruits and vegetables, or milk products 0 No I have three or more drinks of beer, liquor or wine almost every day 0 No I have tooth or mouth problems that make it hard for me to eat 0 No I don't always have enough money to buy the food I need 0 No I eat alone most of the time 0 No I take three or more different prescribed or over-the-counter drugs a day 1 Yes 0 No Without wanting to, I have lost or gained 10 pounds in the last six months I am not always physically able to shop, cook and/or feed myself 0 No Nutrition Protocols Good Risk Protocol 0 No interventions needed Moderate Risk Protocol High Risk Proctocol Risk Level: Good Risk Score: 1 Electronic Signature(s) Signed: 03/28/2019 5:56:42 PM By: Kela Millin Entered By: Kela Millin on 03/28/2019 14:07:10

## 2019-03-28 NOTE — Progress Notes (Signed)
Amanda Butler, Amanda Butler ([id number]) Visit Report for 03/28/2019 Chief Complaint Document Details Patient Name: Date of Service: Amanda Butler, Amanda Butler 03/28/2019 1:15 PM Medical Record [id number] Patient Account Number: [id number] Date of Birth/Sex: Treating RN: 1955-05-23 (63 y.o. F) Primary Care Provider: Simona Huh Other Clinician: Referring Provider: Treating Provider/Extender:Ikechukwu Cerny, Vivi Barrack, Melvern Sample in Treatment: 0 Information Obtained from: Patient Chief Complaint Left lower leg cellulitis and right buttock pressure ulcer 09/14/2018; patient returns to clinic with weeping fluid out of her left lateral calf 03/28/2019; patient returns again with a large area of weeping edema on the left lateral calf with loss of surface epithelium Electronic Signature(s) Signed: 03/28/2019 5:56:50 PM By: Linton Ham MD Entered By: Linton Ham on 03/28/2019 15:06:12 -------------------------------------------------------------------------------- HPI Details Patient Name: Date of Service: Amanda Butler 03/28/2019 1:15 PM Medical Record [id number] Patient Account Number: [id number] Date of Birth/Sex: Treating RN: [birth year] (63 y.o. F) Primary Care Provider: Simona Huh Other Clinician: Referring Provider: Treating Provider/Extender:Marcayla Budge, Vivi Barrack, Melvern Sample in Treatment: 0 History of Present Illness HPI Description: 09/09/17 on evaluation today patient presents for initial evaluation concerning a left lateral lower extremity cellulitis which she's had for about a month and has recently been treated by her primary care provider with doxycycline regarding. This is Dr. Kelton Pillar at [facility]. This last office visit was actually on 08/24/17. I did review the note today as well and the patient was placed on doxycycline for 10 days. In regard to the right buttock area patient states that nothing was performed as far as treatment is  concerned and upon reviewing the noted appears that again she did not have any treatment for this region. With that being said she's been using a in the ointment she states she really cannot get any dressing to the area anyway. She does have a history of congestive heart failure, diabetes mellitus type II, and has been using an ABD an ace wrap for her left leg. The buttock. Began in December and again the leg cellulitis began one month ago. She does not really have any significant pain at either location she states in the past it was worse than it currently is at this point. 08/23/17 on evaluation today patient presents for follow-up evaluation and our clinic concerning her left lower extremity cellulitis. It appears as per the reviewed culture today that she does indeed have an infection. This culture grew out Serratia Marcescens. With that being said the doxycycline that she was previously on will not work for this infection she is going to need something more specific to this bacterial organism. She states she's definitely okay with taking whatever is necessary to get this to clear. Otherwise there does not appear to be any evidence of a significant ulceration which is excellent news. 09/30/17 on evaluation today patient appears to be doing about the same in regard to her left lateral lower extremity cellulitis. Fortunately she only took one dose of the Cipro she had an allergic reaction to this she did not call us and she has not taken anything in the interim since that time. She states that she began to have heart palpitations in sweats. She did not go to the ER. Unfortunately she continues to show signs of continued again cellulitis in regard to her left lower extremity. She also is not tolerating the wraps very well she's nervous about doing anything topical such as gentamicin although I'm not even sure this would be the best thing for her. The  only other option we really have orally would be  back from DS she does not know of any specific allergies this Septra or so for the base products but again she states that she is "allergic to pretty much everything". She really would like to be admitted to the hospital for IV antibiotic therapy was she to be watched due to the severe reaction she has to most medications.Her wound culture actually grew Serratia Mercans. This again was sensitive to both Cipro as well is Bactrim. 10/21/17 on evaluation today patient actually appears to be doing very well. She was admitted to the hospital where she was placed on IV Rocephin and then subsequently discharged with [facility]. This again is the antibiotic that I had recommended for her during my last evaluation. Nonetheless she states that she did fairly well with this without any complications. This makes both Omnicef as well is doxycycline that she is able to take she has issues with most other antibiotics. Nonetheless she appears to be completely healed today. readmission 09/14/2018 This is a 63 year old woman who has chronic venous insufficiency with secondary lymphedema. She was seen here last year by Jeri Cos. She was discharged and healed state. She states that about a month ago she had a reopening in the left leg. She is apparently been to the ER and was given a course of antibiotics [cephalexin] on 5/9 and she was wrapped in an [employer] which caused her to have hives so this was taken off. She has a litany of complaints against any form of compression whether it is compression wraps we apply for compression stockings. She states that compression wraps we put on because her foot to go numb and then she has difficulty walking and is prone to fall. She complains about not being able to put compression stockings on. Past medical history; chronic venous insufficiency, diastolic heart failure, hypertension, hyperlipidemia, type 2 diabetes, history of a pressure ulcer on her left buttock,  gastroesophageal reflux disease, obesity ABI in our clinic was noncompressible on the left. Last year this was 1.15 09/30/2018; since the patient was last here for readmission she was admitted to hospital from 6/10/17/08. This happened when there was drainage coming out of the bottom of her leg which was apparently foul-smelling. She did not know what to do she went to the ER. She was started on IV antibiotics. She was discharged home on Septra DS. The picture we looked at from her admission HandP to hospital did not really look any different from what the area looks like currently her white blood count was normal. Notable that she had a hemoglobin A1c of 9.4 her SARS coronavirus assay was negative. She arrives with nothing on the leg for the last 8 days 6/25; the patient arrives today with her juxtalite stocking for the left leg however she has as oh draining area superiorly. A lot more edema in the left leg with Distention. I was concerned that this could all breakdown therefore with the patient's agreement we put her in a compression wrap today with the idea of transitioning her into a stocking. I think all of this is severe lymphedema/chronic venous insufficiency. 7/2; the patient arrives with all the wounds on the left leg healed. We transitioned her into a juxta lite stocking. She has chronic venous insufficiency with stasis dermatitis and secondary lymphedema. She also has very dry skin in her lower extremities. READMISSION 12/14 Patient returns to clinic today with a 1 month history of progressive skin breakdown  and wound on the left lateral calf again which is essentially the same as last time. She tells me she has a lot of drainage. She stopped wearing her stockings sometime last week because it was getting saturated. Of course this is increased her edema. We discharged her on a juxta light last time on the left leg and she claims she was compliant according to the patient she has been on  3 rounds of antibiotics. She was started on Augmentin on December first completing on the 10th. She was using Xeroform and kerlix Past medical history is essentially unchanged she is a type II diabetic. She was hospitalized for cellulitis in October. She refuses an ABI because she "cannot tolerate it" Electronic Signature(s) Signed: 03/28/2019 5:56:50 PM By: Linton Ham MD Entered By: Linton Ham on 03/28/2019 15:10:46 -------------------------------------------------------------------------------- Physical Exam Details Patient Name: Date of Service: Amanda Butler 03/28/2019 1:15 PM Medical Record [id number] Patient Account Number: [id number] Date of Birth/Sex: Treating RN: March 24, 1956 (63 y.o. F) Primary Care Provider: Simona Huh Other Clinician: Referring Provider: Treating Provider/Extender:Blessing Zaucha, Vivi Barrack, Melvern Sample in Treatment: 0 Constitutional Patient is hypertensive.. Pulse regular and within target range for patient.Marland Kitchen Respirations regular, non-labored and within target range.. Temperature is normal and within the target range for the patient.Marland Kitchen Appears in no distress. Eyes Conjunctivae clear. No discharge.no icterus. Respiratory work of breathing is normal. Bilateral breath sounds are clear and equal in all lobes with no wheezes, rales or rhonchi.. Cardiovascular Heart rhythm and rate regular, without murmur or gallop. No evidence of CHF. Pedal pulses are palpable. 3+ nonpitting edema of the left leg. Nontender no warmth no evidence of the DVT. Lymphatic None palpable in the popliteal area bilaterally. Integumentary (Hair, Skin) See no evidence of cellulitis. Psychiatric appears at normal baseline. Notes Wound exam On the left lateral calf and a substantial area of epithelial loss extending laterally to the posterior calf. I see no evidence of surrounding cellulitis at this point. There is weeping edema fluid. Nonpitting edema. No  evidence of a DVT Electronic Signature(s) Signed: 03/28/2019 5:56:50 PM By: Linton Ham MD Entered By: Linton Ham on 03/28/2019 15:12:47 -------------------------------------------------------------------------------- Physician Orders Details Patient Name: Date of Service: Amanda Butler 03/28/2019 1:15 PM Medical Record [id number] Patient Account Number: [id number] Date of Birth/Sex: Treating RN: September 28, 1955 (63 y.o. Amanda Butler Primary Care Provider: Simona Huh Other Clinician: Referring Provider: Treating Provider/Extender:Darnelle Derrick, Vivi Barrack, Melvern Sample in Treatment: 0 Verbal / Phone Orders: No Diagnosis Coding Follow-up Appointments Return Appointment in 1 week. Dressing Change Frequency Wound #4 Left,Lateral Lower Leg Do not change entire dressing for one week. Skin Barriers/Peri-Wound Care Wound #4 Left,Lateral Lower Leg Moisturizing lotion TCA Cream or Ointment - mixed with lotion Wound Cleansing Wound #4 Left,Lateral Lower Leg May shower with protection. Primary Wound Dressing Wound #4 Left,Lateral Lower Leg Calcium Alginate with Silver Secondary Dressing Wound #4 Left,Lateral Lower Leg ABD pad Kerramax - or Zetuvit Edema Control 3 Layer Compression System - Left Lower Extremity Avoid standing for long periods of time Elevate legs to the level of the heart or above for 30 minutes daily and/or when sitting, a frequency of: - throughout the day Exercise regularly Electronic Signature(s) Signed: 03/28/2019 5:56:50 PM By: Linton Ham MD Signed: 03/28/2019 6:02:21 PM By: Levan Hurst RN, BSN Entered By: Levan Hurst on 03/28/2019 14:58:00 -------------------------------------------------------------------------------- Problem List Details Patient Name: Date of Service: Amanda Butler 03/28/2019 1:15 PM Medical Record [id number] Patient Account Number: [id number] Date of Birth/Sex: Treating  RN: November 20, 1955  (63 y.o. F) Primary Care Provider: Simona Huh Other Clinician: Referring Provider: Treating Provider/Extender:Matthieu Loftus, Vivi Barrack, Melvern Sample in Treatment: 0 Active Problems ICD-10 Evaluated Encounter Code Description Active Date Today Diagnosis L97.221 Non-pressure chronic ulcer of left calf limited to 03/28/2019 No Yes breakdown of skin I89.0 Lymphedema, not elsewhere classified 03/28/2019 No Yes I87.332 Chronic venous hypertension (idiopathic) with ulcer 03/28/2019 No Yes and inflammation of left lower extremity E11.622 Type 2 diabetes mellitus with other skin ulcer 03/28/2019 No Yes Inactive Problems Resolved Problems Electronic Signature(s) Signed: 03/28/2019 5:56:50 PM By: Linton Ham MD Entered By: Linton Ham on 03/28/2019 15:04:47 -------------------------------------------------------------------------------- Progress Note Details Patient Name: Date of Service: Amanda Butler 03/28/2019 1:15 PM Medical Record [id number] Patient Account Number: [id number] Date of Birth/Sex: Treating RN: May 16, 1955 (63 y.o. F) Primary Care Provider: Simona Huh Other Clinician: Referring Provider: Treating Provider/Extender:Trixy Loyola, Vivi Barrack, Melvern Sample in Treatment: 0 Subjective Chief Complaint Information obtained from Patient Left lower leg cellulitis and right buttock pressure ulcer 09/14/2018; patient returns to clinic with weeping fluid out of her left lateral calf 03/28/2019; patient returns again with a large area of weeping edema on the left lateral calf with loss of surface epithelium History of Present Illness (HPI) 09/09/17 on evaluation today patient presents for initial evaluation concerning a left lateral lower extremity cellulitis which she's had for about a month and has recently been treated by her primary care provider with doxycycline regarding. This is Dr. Kelton Pillar at [facility]. This last office visit  was actually on 08/24/17. I did review the note today as well and the patient was placed on doxycycline for 10 days. In regard to the right buttock area patient states that nothing was performed as far as treatment is concerned and upon reviewing the noted appears that again she did not have any treatment for this region. With that being said she's been using a in the ointment she states she really cannot get any dressing to the area anyway. She does have a history of congestive heart failure, diabetes mellitus type II, and has been using an ABD an ace wrap for her left leg. The buttock. Began in December and again the leg cellulitis began one month ago. She does not really have any significant pain at either location she states in the past it was worse than it currently is at this point. 08/23/17 on evaluation today patient presents for follow-up evaluation and our clinic concerning her left lower extremity cellulitis. It appears as per the reviewed culture today that she does indeed have an infection. This culture grew out Serratia Marcescens. With that being said the doxycycline that she was previously on will not work for this infection she is going to need something more specific to this bacterial organism. She states she's definitely okay with taking whatever is necessary to get this to clear. Otherwise there does not appear to be any evidence of a significant ulceration which is excellent news. 09/30/17 on evaluation today patient appears to be doing about the same in regard to her left lateral lower extremity cellulitis. Fortunately she only took one dose of the Cipro she had an allergic reaction to this she did not call us and she has not taken anything in the interim since that time. She states that she began to have heart palpitations in sweats. She did not go to the ER. Unfortunately she continues to show signs of continued again cellulitis in regard to her left  lower extremity. She also  is not tolerating the wraps very well she's nervous about doing anything topical such as gentamicin although I'm not even sure this would be the best thing for her. The only other option we really have orally would be back from DS she does not know of any specific allergies this Septra or so for the base products but again she states that she is "allergic to pretty much everything". She really would like to be admitted to the hospital for IV antibiotic therapy was she to be watched due to the severe reaction she has to most medications.Her wound culture actually grew Serratia Mercans. This again was sensitive to both Cipro as well is Bactrim. 10/21/17 on evaluation today patient actually appears to be doing very well. She was admitted to the hospital where she was placed on IV Rocephin and then subsequently discharged with [facility]. This again is the antibiotic that I had recommended for her during my last evaluation. Nonetheless she states that she did fairly well with this without any complications. This makes both Omnicef as well is doxycycline that she is able to take she has issues with most other antibiotics. Nonetheless she appears to be completely healed today. readmission 09/14/2018 This is a 63 year old woman who has chronic venous insufficiency with secondary lymphedema. She was seen here last year by Jeri Cos. She was discharged and healed state. She states that about a month ago she had a reopening in the left leg. She is apparently been to the ER and was given a course of antibiotics [cephalexin] on 5/9 and she was wrapped in an [employer] which caused her to have hives so this was taken off. She has a litany of complaints against any form of compression whether it is compression wraps we apply for compression stockings. She states that compression wraps we put on because her foot to go numb and then she has difficulty walking and is prone to fall. She complains about not being able to  put compression stockings on. Past medical history; chronic venous insufficiency, diastolic heart failure, hypertension, hyperlipidemia, type 2 diabetes, history of a pressure ulcer on her left buttock, gastroesophageal reflux disease, obesity ABI in our clinic was noncompressible on the left. Last year this was 1.15 09/30/2018; since the patient was last here for readmission she was admitted to hospital from 6/10/17/08. This happened when there was drainage coming out of the bottom of her leg which was apparently foul-smelling. She did not know what to do she went to the ER. She was started on IV antibiotics. She was discharged home on Septra DS. The picture we looked at from her admission HandP to hospital did not really look any different from what the area looks like currently her white blood count was normal. Notable that she had a hemoglobin A1c of 9.4 her SARS coronavirus assay was negative. She arrives with nothing on the leg for the last 8 days 6/25; the patient arrives today with her juxtalite stocking for the left leg however she has as oh draining area superiorly. A lot more edema in the left leg with Distention. I was concerned that this could all breakdown therefore with the patient's agreement we put her in a compression wrap today with the idea of transitioning her into a stocking. I think all of this is severe lymphedema/chronic venous insufficiency. 7/2; the patient arrives with all the wounds on the left leg healed. We transitioned her into a juxta lite stocking. She has chronic  venous insufficiency with stasis dermatitis and secondary lymphedema. She also has very dry skin in her lower extremities. READMISSION 12/14 Patient returns to clinic today with a 1 month history of progressive skin breakdown and wound on the left lateral calf again which is essentially the same as last time. She tells me she has a lot of drainage. She stopped wearing her stockings sometime last week  because it was getting saturated. Of course this is increased her edema. We discharged her on a juxta light last time on the left leg and she claims she was compliant according to the patient she has been on 3 rounds of antibiotics. She was started on Augmentin on December first completing on the 10th. She was using Xeroform and kerlix Past medical history is essentially unchanged she is a type II diabetic. She was hospitalized for cellulitis in October. She refuses an ABI because she "cannot tolerate it" Patient History Information obtained from Patient. Allergies aspirin, latex, NSAIDS (Non-Steroidal Anti-Inflammatory Drug), iodine, bee venom protein (honey bee), shellfish derived, morphine, metformin, clindamycin (Severity: Moderate, Reaction: hives) Family History Cancer - Maternal Grandparents,Mother, Diabetes - Maternal Grandparents,Paternal Grandparents,Mother, [place], Hypertension - Mother, No family history of Hereditary Spherocytosis, Kidney Disease, Lung Disease, Seizures, Stroke, Thyroid Problems, Tuberculosis. Social History Never smoker, Marital Status - Widowed, Alcohol Use - Never, Drug Use - No History, Caffeine Use - Moderate. Medical History Eyes Denies history of Cataracts, Glaucoma, Optic Neuritis Ear/Nose/Mouth/Throat Patient has history of Chronic sinus problems/congestion Denies history of Middle ear problems Hematologic/Lymphatic Patient has history of Lymphedema Respiratory Patient has history of Asthma Cardiovascular Patient has history of Congestive Heart Failure, Hypertension, Myocardial Infarction Endocrine Patient has history of Type II Diabetes Denies history of Type I Diabetes Immunological Denies history of Lupus Erythematosus, Raynaudoos, Scleroderma Integumentary (Skin) Denies history of History of Burn Musculoskeletal Patient has history of Osteoarthritis Neurologic Patient has history of  Neuropathy Oncologic Denies history of Received Chemotherapy, Received Radiation Psychiatric Patient has history of Confinement Anxiety Denies history of Anorexia/bulimia Hospitalization/Surgery History - questionable MI. - tubal ligation. - bunionectomy. - left leg wound inpatient 09/22/2018 x3 days for IV ABX. Medical And Surgical History Notes Constitutional Symptoms (General Health) morbid obesity Cardiovascular hyperlipidemia stroke Gastrointestinal GERD Psychiatric anxiety Review of Systems (ROS) Constitutional Symptoms (General Health) Denies complaints or symptoms of Fatigue, Fever, Chills, Marked Weight Change. Eyes Denies complaints or symptoms of Dry Eyes, Vision Changes, Glasses / Contacts. Cardiovascular Denies complaints or symptoms of Chest pain. Genitourinary Denies complaints or symptoms of Frequent urination. Objective Constitutional Patient is hypertensive.. Pulse regular and within target range for patient.Marland Kitchen Respirations regular, non-labored and within target range.. Temperature is normal and within the target range for the patient.Marland Kitchen Appears in no distress. Vitals Time Taken: 1:58 PM, Height: 66 in, Source: Stated, Weight: 258 lbs, Source: Stated, BMI: 41.6, Temperature: 98.0 F, Pulse: 86 bpm, Respiratory Rate: 19 breaths/min, Blood Pressure: 188/82 mmHg. General Notes: patient stated she has not checked her CBG in a few days Eyes Conjunctivae clear. No discharge.no icterus. Respiratory work of breathing is normal. Bilateral breath sounds are clear and equal in all lobes with no wheezes, rales or rhonchi.. Cardiovascular Heart rhythm and rate regular, without murmur or gallop. No evidence of CHF. Pedal pulses are palpable. 3+ nonpitting edema of the left leg. Nontender no warmth no evidence of the DVT. Lymphatic None palpable in the popliteal area bilaterally. Psychiatric appears at normal baseline. General Notes: Wound exam On the left lateral calf  and  a substantial area of epithelial loss extending laterally to the posterior calf. I see no evidence of surrounding cellulitis at this point. There is weeping edema fluid. Nonpitting edema. No evidence of a DVT Integumentary (Hair, Skin) See no evidence of cellulitis. Wound #4 status is Open. Original cause of wound was Gradually Appeared. The wound is located on the Left,Lateral Lower Leg. The wound measures 17cm length x 24cm width x 0.1cm depth; 320.442cm^2 area and 32.044cm^3 volume. There is Fat Layer (Subcutaneous Tissue) Exposed exposed. There is no tunneling or undermining noted. There is a large amount of serous drainage noted. The wound margin is distinct with the outline attached to the wound base. There is large (67-100%) pink, pale granulation within the wound bed. There is a small (1-33%) amount of necrotic tissue within the wound bed including Adherent Slough. 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 There was a Three Layer Compression Therapy Procedure by Levan Hurst, RN. Post procedure Diagnosis Wound #4: Same as Pre-Procedure Plan Follow-up Appointments: Return Appointment in 1 week. Dressing Change Frequency: Wound #4 Left,Lateral Lower Leg: Do not change entire dressing for one week. Skin Barriers/Peri-Wound Care: Wound #4 Left,Lateral Lower Leg: Moisturizing lotion TCA Cream or Ointment - mixed with lotion Wound Cleansing: Wound #4 Left,Lateral Lower Leg: May shower with protection. Primary Wound Dressing: Wound #4 Left,Lateral Lower Leg: Calcium Alginate with Silver Secondary Dressing: Wound #4  Left,Lateral Lower Leg: ABD pad Kerramax - or Zetuvit Edema Control: 3 Layer Compression System - Left Lower Extremity Avoid standing for long periods of time Elevate legs to the level of the heart or above for 30 minutes daily and/or when sitting, a frequency of: - throughout the day Exercise regularly 1. Substantial wound on the left lateral calf primary dressing is silver alginate. TCA/Kerramax/ABD under 3 layer compression which she has tolerated well in the past 2. She has just completed 10 days of Augmentin and claims that she had antibiotics before this. Currently I see no evidence of infection Electronic Signature(s) Signed: 03/28/2019 5:56:50 PM By: Linton Ham MD Entered By: Linton Ham on 03/28/2019 15:14:20 -------------------------------------------------------------------------------- HxROS Details Patient Name: Date of Service: Amanda Butler 03/28/2019 1:15 PM Medical Record [id number] Patient Account Number: [id number] Date of Birth/Sex: Treating RN: [birth year] (63 y.o. Amanda Butler Primary Care Provider: Simona Huh Other Clinician: Referring Provider: Treating Provider/Extender:Ambria Mayfield, Vivi Barrack, Melvern Sample in Treatment: 0 Information Obtained From Patient Constitutional Symptoms (General Health) Complaints and Symptoms: Negative for: Fatigue; Fever; Chills; Marked Weight Change Medical History: Past Medical History Notes: morbid obesity Eyes Complaints and Symptoms: Negative for: Dry Eyes; Vision Changes; Glasses / Contacts Medical History: Negative for: Cataracts; Glaucoma; Optic Neuritis Cardiovascular Complaints and Symptoms: Negative for: Chest pain Medical History: Positive for: Congestive Heart Failure; Hypertension; Myocardial Infarction Past Medical History Notes: hyperlipidemia stroke Genitourinary Complaints and Symptoms: Negative for: Frequent urination Ear/Nose/Mouth/Throat Medical  History: Positive for: Chronic sinus problems/congestion Negative for: Middle ear problems Hematologic/Lymphatic Medical History: Positive for: Lymphedema Respiratory Medical History: Positive for: Asthma Gastrointestinal Medical History: Past Medical History Notes: GERD Endocrine Medical History: Positive for: Type II Diabetes Negative for: Type I Diabetes Time with diabetes: 5 years Treated with: Oral agents Blood sugar tested  every day: No Immunological Medical History: Negative for: Lupus Erythematosus; Raynauds; Scleroderma Integumentary (Skin) Medical History: Negative for: History of Burn Musculoskeletal Medical History: Positive for: Osteoarthritis Neurologic Medical History: Positive for: Neuropathy Oncologic Medical History: Negative for: Received Chemotherapy; Received Radiation Psychiatric Medical History: Positive for: Confinement Anxiety Negative for: Anorexia/bulimia Past Medical History Notes: anxiety HBO Extended History Items Ear/Nose/Mouth/Throat: Chronic sinus problems/congestion Immunizations Pneumococcal Vaccine: Received Pneumococcal Vaccination: No Implantable Devices No devices added Hospitalization / Surgery History Type of Hospitalization/Surgery questionable [place] tubal ligation bunionectomy left leg wound inpatient 09/22/2018 x3 days for IV ABX Family and Social History Cancer: Yes - Maternal Grandparents,Mother; Diabetes: Yes - Maternal Grandparents,Paternal Grandparents,Mother; Heart Disease: Yes - Mother,Father; Hereditary Spherocytosis: No; Hypertension: Yes - Mother; Kidney Disease: No; Lung Disease: No; Seizures: No; Stroke: No; Thyroid Problems: No; Tuberculosis: No; Never smoker; Marital Status - Widowed; Alcohol Use: Never; Drug Use: No History; Caffeine Use: Moderate; Financial Concerns: No; Food, Clothing or Shelter Needs: No; Support System Lacking: No; Transportation Concerns: No Electronic Signature(s) Signed:  03/28/2019 5:56:42 PM By: Kela Millin Signed: 03/28/2019 5:56:50 PM By: Linton Ham MD Entered By: Kela Millin on 03/28/2019 14:04:17 -------------------------------------------------------------------------------- SuperBill Details Patient Name: Date of Service: Amanda Butler 03/28/2019 Medical Record [id number] Patient Account Number: [id number] Date of Birth/Sex: Treating RN: 1956/01/18 (63 y.o. F) Primary Care Provider: Simona Huh Other Clinician: Referring Provider: Treating Provider/Extender:Remingtyn Depaola, Vivi Barrack, Melvern Sample in Treatment: 0 Diagnosis Coding ICD-10 Codes Code Description [phone] Non-pressure chronic ulcer of left calf limited to breakdown of skin I89.0 Lymphedema, not elsewhere classified I87.332 Chronic venous hypertension (idiopathic) with ulcer and inflammation of left lower extremity E11.622 Type 2 diabetes mellitus with other skin ulcer Facility Procedures CPT4 Code Description: [id number] - WOUND CARE VISIT-LEV 3 EST PT Modifier: 25 Quantity: 1 CPT4 Code Description: [id number] (Facility Use Only) [id number] - [place] LWR LT LEG Modifier: Quantity: 1 Physician Procedures CPT4 Code Description: [id number] - WC PHYS LEVEL 4 - EST PT ICD-10 Diagnosis Description L97.221 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

## 2019-03-31 ENCOUNTER — Encounter (HOSPITAL_BASED_OUTPATIENT_CLINIC_OR_DEPARTMENT_OTHER): Payer: Medicare Other | Admitting: Internal Medicine

## 2019-04-04 ENCOUNTER — Encounter (HOSPITAL_BASED_OUTPATIENT_CLINIC_OR_DEPARTMENT_OTHER): Payer: Medicare Other | Admitting: Internal Medicine

## 2019-04-04 ENCOUNTER — Other Ambulatory Visit: Payer: Self-pay

## 2019-04-04 DIAGNOSIS — E11622 Type 2 diabetes mellitus with other skin ulcer: Secondary | ICD-10-CM | POA: Diagnosis not present

## 2019-04-05 NOTE — Progress Notes (Signed)
Amanda, Butler ([id number]) Visit Report for 04/04/2019 HPI Details Patient Name: Date of Service: Amanda Butler, STANDING 04/04/2019 11:00 AM Medical Record [id number] Patient Account Number: [id number] Date of Birth/Sex: Treating RN: 1955/08/03 (63 y.o. F) Primary Care Provider: Simona Huh Other Clinician: Referring Provider: Treating Provider/Extender:Estellar Cadena, Vivi Barrack, Melvern Sample in Treatment: 1 History of Present Illness HPI Description: 09/09/17 on evaluation today patient presents for initial evaluation concerning a left lateral lower extremity cellulitis which she's had for about a month and has recently been treated by her primary care provider with doxycycline regarding. This is Dr. Kelton Pillar at [facility]. This last office visit was actually on 08/24/17. I did review the note today as well and the patient was placed on doxycycline for 10 days. In regard to the right buttock area patient states that nothing was performed as far as treatment is concerned and upon reviewing the noted appears that again she did not have any treatment for this region. With that being said she's been using a in the ointment she states she really cannot get any dressing to the area anyway. She does have a history of congestive heart failure, diabetes mellitus type II, and has been using an ABD an ace wrap for her left leg. The buttock. Began in December and again the leg cellulitis began one month ago. She does not really have any significant pain at either location she states in the past it was worse than it currently is at this point. 08/23/17 on evaluation today patient presents for follow-up evaluation and our clinic concerning her left lower extremity cellulitis. It appears as per the reviewed culture today that she does indeed have an infection. This culture grew out Serratia Marcescens. With that being said the doxycycline that she was previously on will not work  for this infection she is going to need something more specific to this bacterial organism. She states she's definitely okay with taking whatever is necessary to get this to clear. Otherwise there does not appear to be any evidence of a significant ulceration which is excellent news. 09/30/17 on evaluation today patient appears to be doing about the same in regard to her left lateral lower extremity cellulitis. Fortunately she only took one dose of the Cipro she had an allergic reaction to this she did not call us and she has not taken anything in the interim since that time. She states that she began to have heart palpitations in sweats. She did not go to the ER. Unfortunately she continues to show signs of continued again cellulitis in regard to her left lower extremity. She also is not tolerating the wraps very well she's nervous about doing anything topical such as gentamicin although I'm not even sure this would be the best thing for her. The only other option we really have orally would be back from DS she does not know of any specific allergies this Septra or so for the base products but again she states that she is "allergic to pretty much everything". She really would like to be admitted to the hospital for IV antibiotic therapy was she to be watched due to the severe reaction she has to most medications.Her wound culture actually grew Serratia Mercans. This again was sensitive to both Cipro as well is Bactrim. 10/21/17 on evaluation today patient actually appears to be doing very well. She was admitted to the hospital where she was placed on IV Rocephin and then subsequently discharged with [facility]. This again  is the antibiotic that I had recommended for her during my last evaluation. Nonetheless she states that she did fairly well with this without any complications. This makes both Omnicef as well is doxycycline that she is able to take she has issues with most other antibiotics.  Nonetheless she appears to be completely healed today. readmission 09/14/2018 This is a 63 year old woman who has chronic venous insufficiency with secondary lymphedema. She was seen here last year by Jeri Cos. She was discharged and healed state. She states that about a month ago she had a reopening in the left leg. She is apparently been to the ER and was given a course of antibiotics [cephalexin] on 5/9 and she was wrapped in an [employer] which caused her to have hives so this was taken off. She has a litany of complaints against any form of compression whether it is compression wraps we apply for compression stockings. She states that compression wraps we put on because her foot to go numb and then she has difficulty walking and is prone to fall. She complains about not being able to put compression stockings on. Past medical history; chronic venous insufficiency, diastolic heart failure, hypertension, hyperlipidemia, type 2 diabetes, history of a pressure ulcer on her left buttock, gastroesophageal reflux disease, obesity ABI in our clinic was noncompressible on the left. Last year this was 1.15 09/30/2018; since the patient was last here for readmission she was admitted to hospital from 6/10/17/08. This happened when there was drainage coming out of the bottom of her leg which was apparently foul-smelling. She did not know what to do she went to the ER. She was started on IV antibiotics. She was discharged home on Septra DS. The picture we looked at from her admission HandP to hospital did not really look any different from what the area looks like currently her white blood count was normal. Notable that she had a hemoglobin A1c of 9.4 her SARS coronavirus assay was negative. She arrives with nothing on the leg for the last 8 days 6/25; the patient arrives today with her juxtalite stocking for the left leg however she has as oh draining area superiorly. A lot more edema in the left leg with  Distention. I was concerned that this could all breakdown therefore with the patient's agreement we put her in a compression wrap today with the idea of transitioning her into a stocking. I think all of this is severe lymphedema/chronic venous insufficiency. 7/2; the patient arrives with all the wounds on the left leg healed. We transitioned her into a juxta lite stocking. She has chronic venous insufficiency with stasis dermatitis and secondary lymphedema. She also has very dry skin in her lower extremities. READMISSION 12/14 Patient returns to clinic today with a 1 month history of progressive skin breakdown and wound on the left lateral calf again which is essentially the same as last time. She tells me she has a lot of drainage. She stopped wearing her stockings sometime last week because it was getting saturated. Of course this is increased her edema. We discharged her on a juxta light last time on the left leg and she claims she was compliant according to the patient she has been on 3 rounds of antibiotics. She was started on Augmentin on December first completing on the 10th. She was using Xeroform and kerlix Past medical history is essentially unchanged she is a type II diabetic. She was hospitalized for cellulitis in October. She refuses an ABI because she "  cannot tolerate it" 12/21; patient comes in today with a new wound on the dorsal aspect of her fourth toe. Although she does not remember precisely how this happened it looks like a laceration she managed to avulsed part of the toenail as well Electronic Signature(s) Signed: 04/05/2019 7:47:18 AM By: Linton Ham MD Entered By: Linton Ham on 04/04/2019 12:52:21 -------------------------------------------------------------------------------- Physical Exam Details Patient Name: Date of Service: [name] 04/04/2019 11:00 AM Medical Record [id number] Patient Account Number: [id number] Date of  Birth/Sex: Treating RN: 12-Jun-1955 (63 y.o. F) Primary Care Provider: Simona Huh Other Clinician: Referring Provider: Treating Provider/Extender:Naiya Corral, Vivi Barrack, Melvern Sample in Treatment: 1 Constitutional Patient is hypertensive.. Pulse regular and within target range for patient.Marland Kitchen Respirations regular, non-labored and within target range.. Temperature is normal and within the target range for the patient.Marland Kitchen Appears in no distress. Eyes Conjunctivae clear. No discharge.no icterus. Respiratory work of breathing is normal. Cardiovascular Dorsalis pedis pulses palpable on both sides. I could not feel the posterior tibial bilateral. Integumentary (Hair, Skin) Stasis dermatitis in the left leg. Psychiatric appears at normal baseline. Notes Wound exam; left lateral calf wound looks a lot better. It looks as though this is epithelializing. There is no weeping edema. She has severe chronic venous insufficiency in this area. No evidence of a DVT On the right she has a laceration injury of the left great toe. His skin is back over the wound I did not debride this. Electronic Signature(s) Signed: 04/05/2019 7:47:18 AM By: Linton Ham MD Entered By: Linton Ham on 04/04/2019 12:55:01 -------------------------------------------------------------------------------- Physician Orders Details Patient Name: Date of Service: [name] 04/04/2019 11:00 AM Medical Record [id number] Patient Account Number: [id number] Date of Birth/Sex: Treating RN: 1955-08-25 (63 y.o. Elam Dutch Primary Care Provider: Simona Huh Other Clinician: Referring Provider: Treating Provider/Extender:Madora Barletta, Vivi Barrack, Melvern Sample in Treatment: 1 Verbal / Phone Orders: No Diagnosis Coding ICD-10 Coding Code Description [phone] Non-pressure chronic ulcer of left calf limited to breakdown of skin I89.0 Lymphedema, not elsewhere classified I87.332 Chronic venous  hypertension (idiopathic) with ulcer and inflammation of left lower extremity E11.622 Type 2 diabetes mellitus with other skin ulcer Follow-up Appointments Return Appointment in 1 week. Dressing Change Frequency Wound #4 Left,Lateral Lower Leg Do not change entire dressing for one week. Wound #5 Right Toe Fourth Change Dressing every other day. Skin Barriers/Peri-Wound Care Wound #4 Left,Lateral Lower Leg Moisturizing lotion TCA Cream or Ointment - mixed with lotion Wound Cleansing Wound #4 Left,Lateral Lower Leg May shower with protection. Wound #5 Right Toe Fourth Clean wound with Wound Cleanser Primary Wound Dressing Wound #4 Left,Lateral Lower Leg Calcium Alginate with Silver Wound #5 Right Toe Fourth Calcium Alginate with Silver Secondary Dressing Wound #4 Left,Lateral Lower Leg ABD pad Wound #5 Right Toe Fourth Kerlix/Rolled Gauze - or bandaid Edema Control 3 Layer Compression System - Left Lower Extremity Avoid standing for long periods of time Elevate legs to the level of the heart or above for 30 minutes daily and/or when sitting, a frequency of: - throughout the day Exercise regularly Electronic Signature(s) Signed: 04/04/2019 5:05:42 PM By: Baruch Gouty RN, BSN Signed: 04/05/2019 7:47:18 AM By: Linton Ham MD Entered By: Baruch Gouty on 04/04/2019 12:13:23 -------------------------------------------------------------------------------- Problem List Details Patient Name: Date of Service: [name] 04/04/2019 11:00 AM Medical Record [id number] Patient Account Number: [id number] Date of Birth/Sex: Treating RN: 1955-08-12 (63 y.o. Elam Dutch Primary Care Provider: Simona Huh Other Clinician: Referring Provider: Treating Provider/Extender:Giulliana Mcroberts, Vivi Barrack,  Modena Jansky Weeks in Treatment: 1 Active Problems ICD-10 Evaluated Encounter Evaluated Encounter Code Description Active Date Today Diagnosis L97.221  Non-pressure chronic ulcer of left calf limited to 03/28/2019 No Yes breakdown of skin I89.0 Lymphedema, not elsewhere classified 03/28/2019 No Yes I87.332 Chronic venous hypertension (idiopathic) with ulcer 03/28/2019 No Yes and inflammation of left lower extremity E11.622 Type 2 diabetes mellitus with other skin ulcer 03/28/2019 No Yes S91.311D Laceration without foreign body, right foot, subsequent 04/04/2019 No Yes encounter Inactive Problems Resolved Problems Electronic Signature(s) Signed: 04/05/2019 7:47:18 AM By: Linton Ham MD Entered By: Linton Ham on 04/04/2019 12:49:27 -------------------------------------------------------------------------------- Progress Note Details Patient Name: Date of Service: [name] 04/04/2019 11:00 AM Medical Record [id number] Patient Account Number: [id number] Date of Birth/Sex: Treating RN: Jul 08, 1955 (63 y.o. F) Primary Care Provider: Simona Huh Other Clinician: Referring Provider: Treating Provider/Extender:Reece Mcbroom, Vivi Barrack, Melvern Sample in Treatment: 1 Subjective History of Present Illness (HPI) 09/09/17 on evaluation today patient presents for initial evaluation concerning a left lateral lower extremity cellulitis which she's had for about a month and has recently been treated by her primary care provider with doxycycline regarding. This is Dr. Kelton Pillar at [facility]. This last office visit was actually on 08/24/17. I did review the note today as well and the patient was placed on doxycycline for 10 days. In regard to the right buttock area patient states that nothing was performed as far as treatment is concerned and upon reviewing the noted appears that again she did not have any treatment for this region. With that being said she's been using a in the ointment she states she really cannot get any dressing to the area anyway. She does have a history of congestive heart failure,  diabetes mellitus type II, and has been using an ABD an ace wrap for her left leg. The buttock. Began in December and again the leg cellulitis began one month ago. She does not really have any significant pain at either location she states in the past it was worse than it currently is at this point. 08/23/17 on evaluation today patient presents for follow-up evaluation and our clinic concerning her left lower extremity cellulitis. It appears as per the reviewed culture today that she does indeed have an infection. This culture grew out Serratia Marcescens. With that being said the doxycycline that she was previously on will not work for this infection she is going to need something more specific to this bacterial organism. She states she's definitely okay with taking whatever is necessary to get this to clear. Otherwise there does not appear to be any evidence of a significant ulceration which is excellent news. 09/30/17 on evaluation today patient appears to be doing about the same in regard to her left lateral lower extremity cellulitis. Fortunately she only took one dose of the Cipro she had an allergic reaction to this she did not call us and she has not taken anything in the interim since that time. She states that she began to have heart palpitations in sweats. She did not go to the ER. Unfortunately she continues to show signs of continued again cellulitis in regard to her left lower extremity. She also is not tolerating the wraps very well she's nervous about doing anything topical such as gentamicin although I'm not even sure this would be the best thing for her. The only other option we really have orally would be back from DS she does not know of any specific allergies this  Septra or so for the base products but again she states that she is "allergic to pretty much everything". She really would like to be admitted to the hospital for IV antibiotic therapy was she to be watched due to the  severe reaction she has to most medications.Her wound culture actually grew Serratia Mercans. This again was sensitive to both Cipro as well is Bactrim. 10/21/17 on evaluation today patient actually appears to be doing very well. She was admitted to the hospital where she was placed on IV Rocephin and then subsequently discharged with [facility]. This again is the antibiotic that I had recommended for her during my last evaluation. Nonetheless she states that she did fairly well with this without any complications. This makes both Omnicef as well is doxycycline that she is able to take she has issues with most other antibiotics. Nonetheless she appears to be completely healed today. readmission 09/14/2018 This is a 63 year old woman who has chronic venous insufficiency with secondary lymphedema. She was seen here last year by Jeri Cos. She was discharged and healed state. She states that about a month ago she had a reopening in the left leg. She is apparently been to the ER and was given a course of antibiotics [cephalexin] on 5/9 and she was wrapped in an [employer] which caused her to have hives so this was taken off. She has a litany of complaints against any form of compression whether it is compression wraps we apply for compression stockings. She states that compression wraps we put on because her foot to go numb and then she has difficulty walking and is prone to fall. She complains about not being able to put compression stockings on. Past medical history; chronic venous insufficiency, diastolic heart failure, hypertension, hyperlipidemia, type 2 diabetes, history of a pressure ulcer on her left buttock, gastroesophageal reflux disease, obesity ABI in our clinic was noncompressible on the left. Last year this was 1.15 09/30/2018; since the patient was last here for readmission she was admitted to hospital from 6/10/17/08. This happened when there was drainage coming out of the bottom of her leg  which was apparently foul-smelling. She did not know what to do she went to the ER. She was started on IV antibiotics. She was discharged home on Septra DS. The picture we looked at from her admission HandP to hospital did not really look any different from what the area looks like currently her white blood count was normal. Notable that she had a hemoglobin A1c of 9.4 her SARS coronavirus assay was negative. She arrives with nothing on the leg for the last 8 days 6/25; the patient arrives today with her juxtalite stocking for the left leg however she has as oh draining area superiorly. A lot more edema in the left leg with Distention. I was concerned that this could all breakdown therefore with the patient's agreement we put her in a compression wrap today with the idea of transitioning her into a stocking. I think all of this is severe lymphedema/chronic venous insufficiency. 7/2; the patient arrives with all the wounds on the left leg healed. We transitioned her into a juxta lite stocking. She has chronic venous insufficiency with stasis dermatitis and secondary lymphedema. She also has very dry skin in her lower extremities. READMISSION 12/14 Patient returns to clinic today with a 1 month history of progressive skin breakdown and wound on the left lateral calf again which is essentially the same as last time. She tells me she has  a lot of drainage. She stopped wearing her stockings sometime last week because it was getting saturated. Of course this is increased her edema. We discharged her on a juxta light last time on the left leg and she claims she was compliant according to the patient she has been on 3 rounds of antibiotics. She was started on Augmentin on December first completing on the 10th. She was using Xeroform and kerlix Past medical history is essentially unchanged she is a type II diabetic. She was hospitalized for cellulitis in October. She refuses an ABI because she "cannot  tolerate it" 12/21; patient comes in today with a new wound on the dorsal aspect of her fourth toe. Although she does not remember precisely how this happened it looks like a laceration she managed to avulsed part of the toenail as well Objective Constitutional Patient is hypertensive.. Pulse regular and within target range for patient.Marland Kitchen Respirations regular, non-labored and within target range.. Temperature is normal and within the target range for the patient.Marland Kitchen Appears in no distress. Vitals Time Taken: 11:18 AM, Height: 66 in, Weight: 258 lbs, BMI: 41.6, Temperature: 98.3 F, Pulse: 81 bpm, Respiratory Rate: 19 breaths/min, Blood Pressure: 168/70 mmHg. Eyes Conjunctivae clear. No discharge.no icterus. Respiratory work of breathing is normal. Cardiovascular Dorsalis pedis pulses palpable on both sides. I could not feel the posterior tibial bilateral. Psychiatric appears at normal baseline. General Notes: Wound exam; left lateral calf wound looks a lot better. It looks as though this is epithelializing. There is no weeping edema. She has severe chronic venous insufficiency in this area. No evidence of a DVT ooOn the right she has a laceration injury of the left great toe. His skin is back over the wound I did not debride this. Integumentary (Hair, Skin) Stasis dermatitis in the left leg. Wound #4 status is Open. Original cause of wound was Gradually Appeared. The wound is located on the Left,Lateral Lower Leg. The wound measures 17cm length x 21cm width x 0.1cm depth; 280.387cm^2 area and 28.039cm^3 volume. There is Fat Layer (Subcutaneous Tissue) Exposed exposed. There is no tunneling or undermining noted. There is a large amount of serous drainage noted. The wound margin is distinct with the outline attached to the wound base. There is large (67-100%) pink, pale granulation within the wound bed. There is a small (1-33%) amount of necrotic tissue within the wound bed including Adherent  Slough. Wound #5 status is Open. Original cause of wound was Trauma. The wound is located on the Right Toe Fourth. The wound measures 0.6cm length x 0.6cm width x 0.1cm depth; 0.283cm^2 area and 0.028cm^3 volume. There is Fat Layer (Subcutaneous Tissue) Exposed exposed. There is no tunneling or undermining noted. There is a small amount of serous drainage noted. The wound margin is distinct with the outline attached to the wound base. There is large (67-100%) pink, pale granulation within the wound bed. There is no necrotic tissue within the wound bed. Assessment Active Problems ICD-10 Non-pressure chronic ulcer of left calf limited to breakdown of skin Lymphedema, not elsewhere classified Chronic venous hypertension (idiopathic) with ulcer and inflammation of left lower extremity Type 2 diabetes mellitus with other skin ulcer Laceration without foreign body, right foot, subsequent encounter Procedures Wound #4 Pre-procedure diagnosis of Wound #4 is a Lymphedema located on the Left,Lateral Lower Leg . There was a Three Layer Compression Therapy Procedure by Deon Pilling, RN. Post procedure Diagnosis Wound #4: Same as Pre-Procedure Plan Follow-up Appointments: Return Appointment in 1 week. Dressing Change  Frequency: Wound #4 Left,Lateral Lower Leg: Do not change entire dressing for one week. Wound #5 Right Toe Fourth: Change Dressing every other day. Skin Barriers/Peri-Wound Care: Wound #4 Left,Lateral Lower Leg: Moisturizing lotion TCA Cream or Ointment - mixed with lotion Wound Cleansing: Wound #4 Left,Lateral Lower Leg: May shower with protection. Wound #5 Right Toe Fourth: Clean wound with Wound Cleanser Primary Wound Dressing: Wound #4 Left,Lateral Lower Leg: Calcium Alginate with Silver Wound #5 Right Toe Fourth: Calcium Alginate with Silver Secondary Dressing: Wound #4 Left,Lateral Lower Leg: ABD pad Wound #5 Right Toe Fourth: Kerlix/Rolled Gauze - or  bandaid Edema Control: 3 Layer Compression System - Left Lower Extremity Avoid standing for long periods of time Elevate legs to the level of the heart or above for 30 minutes daily and/or when sitting, a frequency of: - throughout the day Exercise regularly 1. ABD and 3 layer compression on the left leg. I have asked her to bring her stocking/juxta light next week 2. Silver alginate to the right fourth toe. She should be able to change this daily. Electronic Signature(s) Signed: 04/05/2019 7:47:18 AM By: Linton Ham MD Entered By: Linton Ham on 04/04/2019 12:55:51 -------------------------------------------------------------------------------- SuperBill Details Patient Name: Date of Service: [name] 04/04/2019 Medical Record [id number] Patient Account Number: [id number] Date of Birth/Sex: Treating RN: 09-20-1955 (63 y.o. Martyn Malay, Linda Primary Care Provider: Simona Huh Other Clinician: Referring Provider: Treating Provider/Extender:Halayna Blane, Vivi Barrack, Melvern Sample in Treatment: 1 Diagnosis Coding ICD-10 Codes Code Description [phone] Non-pressure chronic ulcer of left calf limited to breakdown of skin I89.0 Lymphedema, not elsewhere classified I87.332 Chronic venous hypertension (idiopathic) with ulcer and inflammation of left lower extremity E11.622 Type 2 diabetes mellitus with other skin ulcer Facility Procedures CPT4 Code Description: [id number] (Facility Use Only) [phone] - APPLY [facility] LWR LT LEG Modifier: Quantity: 1 Physician Procedures CPT4 Code Description: [id number] - WC PHYS LEVEL 3 - EST PT ICD-10 Diagnosis Description L97.221 Non-pressure chronic ulcer of left calf limited to brea I89.0 Lymphedema, not elsewhere classified I87.332 Chronic venous hypertension (idiopathic) with  ulcer and extremity Modifier: kdown of skin inflammation of Quantity: 1 left lower Electronic Signature(s) Signed: 04/05/2019 7:47:18  AM By: Linton Ham MD Entered By: Linton Ham on 04/04/2019 12:56:10

## 2019-04-06 NOTE — Progress Notes (Signed)
DEBBORAH, GHEZZI ([id number]) Visit Report for 04/04/2019 Arrival Information Details Patient Name: Date of Service: DINASIA, SHROFF 04/04/2019 11:00 AM Medical Record [id number] Patient Account Number: [id number] Date of Birth/Sex: Treating RN: June 28, 1955 (63 y.o. Clearnce Sorrel Primary Care Kahli Fitzgerald: Simona Huh Other Clinician: Referring Maribelle Hopple: Treating Kenlie Seki/Extender:Robson, Vivi Barrack, Melvern Sample in Treatment: 1 Visit Information History Since Last Visit Added or deleted any medications: No Patient Arrived: Gilford Rile Any new allergies or adverse reactions: No Arrival Time: 11:17 Had a fall or experienced change in No Accompanied By: self activities of daily living that may affect Transfer Assistance: None risk of falls: Patient Identification Verified: Yes Signs or symptoms of abuse/neglect since last No Secondary Verification Process Yes visito Completed: Hospitalized since last visit: No Patient Requires Transmission- No Implantable device outside of the clinic excluding No Based Precautions: cellular tissue based products placed in the center Patient Has Alerts: Yes since last visit: Patient Alerts: unable to obtain Has Dressing in Place as Prescribed: Yes ABI-pain Has Compression in Place as Prescribed: Yes Pain Present Now: No Electronic Signature(s) Signed: 04/06/2019 4:54:21 PM By: Kela Millin Entered By: Kela Millin on 04/04/2019 11:18:16 -------------------------------------------------------------------------------- Compression Therapy Details Patient Name: Date of Service: DARCHELLE, LANGILLE 04/04/2019 11:00 AM Medical Record [id number] Patient Account Number: [id number] Date of Birth/Sex: Treating RN: Mar 07, 1956 (64 y.o. Elam Dutch Primary Care Trista Ciocca: Simona Huh Other Clinician: Referring Dorlene Footman: Treating Angelyn Osterberg/Extender:Robson, Vivi Barrack, Melvern Sample in Treatment:  1 Compression Therapy Performed for Wound Wound #4 Left,Lateral Lower Leg Assessment: Performed By: Clinician Deon Pilling, RN Compression Type: Three Layer Post Procedure Diagnosis Same as Pre-procedure Electronic Signature(s) Signed: 04/04/2019 5:05:42 PM By: Baruch Gouty RN, BSN Entered By: Baruch Gouty on 04/04/2019 12:14:42 -------------------------------------------------------------------------------- Encounter Discharge Information Details Patient Name: Date of Service: [name] 04/04/2019 11:00 AM Medical Record [id number] Patient Account Number: [id number] Date of Birth/Sex: Treating RN: 03-15-1956 (63 y.o. Debby Bud Primary Care Hedi Barkan: Simona Huh Other Clinician: Referring Krist Rosenboom: Treating Elin Seats/Extender:Robson, Vivi Barrack, Melvern Sample in Treatment: 1 Encounter Discharge Information Items Discharge Condition: Stable Ambulatory Status: Walker Discharge Destination: Home Transportation: Private Auto Accompanied By: self Schedule Follow-up Appointment: Yes Clinical Summary of Care: Electronic Signature(s) Signed: 04/04/2019 5:06:44 PM By: Deon Pilling Entered By: Deon Pilling on 04/04/2019 14:02:27 -------------------------------------------------------------------------------- Lower Extremity Assessment Details Patient Name: Date of Service: TRELLIS, BROUSSARD 04/04/2019 11:00 AM Medical Record [id number] Patient Account Number: [id number] Date of Birth/Sex: Treating RN: 1955/05/04 (63 y.o. Clearnce Sorrel Primary Care Janilah Hojnacki: Simona Huh Other Clinician: Referring Jamiah Homeyer: Treating Azaiah Mello/Extender:Robson, Vivi Barrack, Melvern Sample in Treatment: 1 Edema Assessment Assessed: [Left: No] [Right: No] Edema: [Left: Ye] [Right: s] Calf Left: Right: Point of Measurement: 33 cm From Medial Instep 41.3 cm cm Ankle Left: Right: Point of Measurement: 11 cm From Medial Instep 30 cm  cm Vascular Assessment Pulses: Dorsalis Pedis Palpable: [Left:Yes] Electronic Signature(s) Signed: 04/06/2019 4:54:21 PM By: Kela Millin Entered By: Kela Millin on 04/04/2019 11:24:53 -------------------------------------------------------------------------------- Multi Wound Chart Details Patient Name: Date of Service: [name] 04/04/2019 11:00 AM Medical Record [id number] Patient Account Number: [id number] Date of Birth/Sex: Treating RN: 05/21/1955 (63 y.o. F) Primary Care Kenyata Napier: Simona Huh Other Clinician: Referring Paidyn Mcferran: Treating Fareedah Mahler/Extender:Robson, Vivi Barrack, Melvern Sample in Treatment: 1 Vital Signs Height(in): 66 Pulse(bpm): 81 Weight(lbs): 258 Blood Pressure(mmHg): 168/70 Body Mass Index(BMI): 42 Temperature(F): 98.3 Respiratory 19 Rate(breaths/min): Photos: [4:No Photos] [5:No Photos] [N/A:N/A] Wound Location: [4:Left Lower Leg - Lateral] [  5:Right Toe Fourth] [N/A:N/A] Wounding Event: [4:Gradually Appeared] [5:Trauma] [N/A:N/A] Primary Etiology: [4:Lymphedema] [5:Diabetic Wound/Ulcer of the N/A Lower Extremity] Secondary Etiology: [4:N/A] [5:Trauma, Other] [N/A:N/A] Comorbid History: [4:Chronic sinus problems/congestion, Lymphedema, Asthma, Congestive Heart Failure, Congestive Heart Failure, Hypertension, Myocardial Hypertension, Myocardial Infarction, Type II Diabetes, Infarction, Type II Diabetes, Osteoarthritis,  Neuropathy, Osteoarthritis, Neuropathy, Confinement Anxiety] [5:Chronic sinus problems/congestion, Lymphedema, Asthma, Confinement Anxiety] [N/A:N/A] Date Acquired: [4:01/13/2019] [5:04/01/2019] [N/A:N/A] Weeks of Treatment: [4:1] [5:0] [N/A:N/A] Wound Status: [4:Open] [5:Open] [N/A:N/A] Measurements L x W x D [4:17x21x0.1] [5:0.6x0.6x0.1] [N/A:N/A] (cm) Area (cm) : [4:280.387] [5:0.283] [N/A:N/A] Volume (cm) : [4:28.039] [5:0.028] [N/A:N/A] % Reduction in Area: [4:12.50%] [5:N/A] [N/A:N/A] %  Reduction in Volume: [4:12.50%] [5:N/A] [N/A:N/A] Classification: [4:Full Thickness Without Exposed Support Structures] [5:Grade 2] [N/A:N/A] Exudate Amount: [4:Large] [5:Small] [N/A:N/A] Exudate Type: [4:Serous] [5:Serous] [N/A:N/A] Exudate Color: [4:amber] [5:amber] [N/A:N/A] Wound Margin: [4:Distinct, outline attached Distinct, outline attached] [N/A:N/A] Granulation Amount: [4:Large (67-100%)] [5:Large (67-100%)] [N/A:N/A] Granulation Quality: [4:Pink, Pale] [5:Pink, Pale] [N/A:N/A] Necrotic Amount: [4:Small (1-33%)] [5:None Present (0%)] [N/A:N/A] Exposed Structures: [4:Fat Layer (Subcutaneous Fat Layer (Subcutaneous Tissue) Exposed: Yes Fascia: No Tendon: No Muscle: No Joint: No Bone: No] [5:Tissue) Exposed: Yes Fascia: No Tendon: No Muscle: No Joint: No Bone: No] [N/A:N/A] Epithelialization: [4:None Compression Therapy] [5:None N/A] [N/A:N/A N/A] Treatment Notes Electronic Signature(s) Signed: 04/05/2019 7:47:18 AM By: Linton Ham MD Entered By: Linton Ham on 04/04/2019 12:49:33 -------------------------------------------------------------------------------- Multi-Disciplinary Care Plan Details Patient Name: Date of Service: [name]. 04/04/2019 11:00 AM Medical Record [id number] Patient Account Number: [id number] Date of Birth/Sex: Treating RN: 07-Aug-1955 (63 y.o. Elam Dutch Primary Care Janard Culp: Simona Huh Other Clinician: Referring Devri Kreher: Treating Dennise Raabe/Extender:Robson, Vivi Barrack, Melvern Sample in Treatment: 1 Active Inactive Nutrition Nursing Diagnoses: Impaired glucose control: actual or potential Potential for alteratiion in Nutrition/Potential for imbalanced nutrition Goals: Patient/caregiver agrees to and verbalizes understanding of need to use nutritional supplements and/or vitamins as prescribed Date Initiated: 03/28/2019 Target Resolution Date: 04/29/2019 Goal Status: Active Patient/caregiver will maintain  therapeutic glucose control Date Initiated: 03/28/2019 Target Resolution Date: 04/29/2019 Goal Status: Active Interventions: Assess HgA1c results as ordered upon admission and as needed Assess patient nutrition upon admission and as needed per policy Provide education on elevated blood sugars and impact on wound healing Provide education on nutrition Treatment Activities: Education provided on Nutrition : 03/28/2019 Notes: Venous Leg Ulcer Nursing Diagnoses: Actual venous Insuffiency (use after diagnosis is confirmed) Knowledge deficit related to disease process and management Goals: Patient will maintain optimal edema control Date Initiated: 03/28/2019 Target Resolution Date: 04/29/2019 Goal Status: Active Patient/caregiver will verbalize understanding of disease process and disease management Date Initiated: 03/28/2019 Target Resolution Date: 04/29/2019 Goal Status: Active Interventions: Assess peripheral edema status every visit. Compression as ordered Provide education on venous insufficiency Notes: Wound/Skin Impairment Nursing Diagnoses: Impaired tissue integrity Knowledge deficit related to ulceration/compromised skin integrity Goals: Patient/caregiver will verbalize understanding of skin care regimen Date Initiated: 03/28/2019 Target Resolution Date: 04/29/2019 Goal Status: Active Ulcer/skin breakdown will have a volume reduction of 30% by week 4 Date Initiated: 03/28/2019 Target Resolution Date: 04/29/2019 Goal Status: Active Interventions: Assess patient/caregiver ability to obtain necessary supplies Assess patient/caregiver ability to perform ulcer/skin care regimen upon admission and as needed Assess ulceration(s) every visit Provide education on ulcer and skin care Notes: Electronic Signature(s) Signed: 04/04/2019 5:05:42 PM By: Baruch Gouty RN, BSN Entered By: Baruch Gouty on 04/04/2019  12:13:57 -------------------------------------------------------------------------------- Pain Assessment Details Patient Name: Date of Service: [name] 04/04/2019 11:00 AM Medical Record [id number] Patient Account  Number: [id number] Date of Birth/Sex: Treating RN: 02-26-1956 (63 y.o. Clearnce Sorrel Primary Care Almedia Cordell: Simona Huh Other Clinician: Referring Marleah Beever: Treating Katria Botts/Extender:Robson, Vivi Barrack, Melvern Sample in Treatment: 1 Active Problems Location of Pain Severity and Description of Pain Patient Has Paino No Site Locations Pain Management and Medication Current Pain Management: Electronic Signature(s) Signed: 04/06/2019 4:54:21 PM By: Kela Millin Entered By: Kela Millin on 04/04/2019 11:19:39 -------------------------------------------------------------------------------- Patient/Caregiver Education Details Patient Name: [name]. Date of Service: 12/21/2020andnbsp11:00 AM Medical Record [phone] Patient Account Number: [id number] Date of Birth/Gender: May 12, 1955 (63 y.o. F) Treating RN: Baruch Gouty Primary Care Physician: Simona Huh Other Clinician: Referring Physician: Treating Physician/Extender:Robson, Vivi Barrack, Melvern Sample in Treatment: 1 Education Assessment Education Provided To: Patient Education Topics Provided Venous: Methods: Explain/Verbal Responses: Reinforcements needed, State content correctly Wound/Skin Impairment: Methods: Explain/Verbal Responses: Reinforcements needed, State content correctly Electronic Signature(s) Signed: 04/04/2019 5:05:42 PM By: Baruch Gouty RN, BSN Entered By: Baruch Gouty on 04/04/2019 12:14:17 -------------------------------------------------------------------------------- Wound Assessment Details Patient Name: Date of Service: [name] 04/04/2019 11:00 AM Medical Record [id number] Patient Account  Number: [id number] Date of Birth/Sex: Treating RN: December 12, 1955 (63 y.o. Clearnce Sorrel Primary Care Johanny Segers: Simona Huh Other Clinician: Referring Federica Allport: Treating Telina Kleckley/Extender:Robson, Vivi Barrack, Melvern Sample in Treatment: 1 Wound Status Wound Number: 4 Primary Lymphedema Etiology: Wound Location: Left Lower Leg - Lateral Wound Open Wounding Event: Gradually Appeared Status: Date Acquired: 01/13/2019 Comorbid Chronic sinus problems/congestion, Weeks Of Treatment: 1 History: Lymphedema, Asthma, Congestive Heart Clustered Wound: No Failure, Hypertension, Myocardial Infarction, Type II Diabetes, Osteoarthritis, Neuropathy, Confinement Anxiety Photos Wound Measurements Length: (cm) 17 % Reduct Width: (cm) 21 % Reduct Depth: (cm) 0.1 Epitheli Area: (cm) 280.387 Tunneli Volume: (cm) 28.039 Undermi Wound Description Classification: Full Thickness Without Exposed Support Foul Odo Structures Slough/F Wound Distinct, outline attached Margin: Exudate Large Amount: Exudate Serous Type: Exudate amber Color: Wound Bed Granulation Amount: Large (67-100%) Granulation Quality: Pink, Pale Fascia E Necrotic Amount: Small (1-33%) Fat Laye Necrotic Quality: Adherent Slough Tendon E Muscle E Joint Ex Bone Exp r After Cleansing: No ibrino Yes Exposed Structure xposed: No r (Subcutaneous Tissue) Exposed: Yes xposed: No xposed: No posed: No osed: No ion in Area: 12.5% ion in Volume: 12.5% alization: None ng: No ning: No Treatment Notes Wound #4 (Left, Lateral Lower Leg) 1. Cleanse With Wound Cleanser Soap and water 2. Periwound Care Moisturizing lotion TCA Cream 3. Primary Dressing Applied Calcium Alginate Ag 4. Secondary Dressing ABD Pad Dry Gauze 6. Support Layer Applied 3 layer compression wrap Notes netting. Electronic Signature(s) Signed: 04/06/2019 3:48:24 PM By: Mikeal Hawthorne EMT/HBOT Signed: 04/06/2019 4:54:21 PM By:  Kela Millin Entered By: Mikeal Hawthorne on 04/06/2019 09:15:50 -------------------------------------------------------------------------------- Wound Assessment Details Patient Name: Date of Service: [name] 04/04/2019 11:00 AM Medical Record [id number] Patient Account Number: [id number] Date of Birth/Sex: Treating RN: 10/18/1955 (63 y.o. Clearnce Sorrel Primary Care Abdulkarim Eberlin: Simona Huh Other Clinician: Referring Zadaya Cuadra: Treating Tristan Bramble/Extender:Robson, Vivi Barrack, Melvern Sample in Treatment: 1 Wound Status Wound Number: 5 Primary Diabetic Wound/Ulcer of the Lower Extremity Etiology: Wound Location: Right Toe Fourth Secondary Trauma, Other Wounding Event: Trauma Etiology: Date Acquired: 04/01/2019 Wound Open Weeks Of Treatment: 0 Status: Clustered Wound: No Comorbid Chronic sinus problems/congestion, History: Lymphedema, Asthma, Congestive Heart Failure, Hypertension, Myocardial Infarction, Type II Diabetes, Osteoarthritis, Neuropathy, Confinement Anxiety Photos Wound Measurements Length: (cm) 0.6 Width: (cm) 0.6 Depth: (cm) 0.1 Area: (cm) 0.283 Volume: (cm) 0.028 Wound Description Classification: Grade 2  Wound Margin: Distinct, outline attached Exudate Amount: Small Exudate Type: Serous Exudate Color: amber Wound Bed Granulation Amount: Large (67-100%) Granulation Quality: Pink, Pale Necrotic Amount: None Present (0%) After Cleansing: No brino No Exposed Structure posed: No (Subcutaneous Tissue) Exposed: Yes posed: No posed: No osed: No sed: No % Reduction in Area: 0% % Reduction in Volume: 0% Epithelialization: None Tunneling: No Undermining: No Foul Odor Slough/Fi Fascia Ex Fat Layer Tendon Ex Muscle Ex Joint Exp Bone Expo Treatment Notes Wound #5 (Right Toe Fourth) 1. Cleanse With Wound Cleanser 3. Primary Dressing Applied Calcium Alginate Ag 4. Secondary Dressing Dry Gauze 5. Secured  With [employer] [facility] [employer]) Signed: 04/06/2019 3:48:24 PM By: Mikeal Hawthorne EMT/HBOT Signed: 04/06/2019 4:54:21 PM By: Kela Millin Entered By: Mikeal Hawthorne on 04/06/2019 09:16:10 -------------------------------------------------------------------------------- Vitals Details Patient Name: Date of Service: [name]. 04/04/2019 11:00 AM Medical Record [id number] Patient Account Number: [id number] Date of Birth/Sex: Treating RN: Jan 20, 1956 (63 y.o. Clearnce Sorrel Primary Care Tadhg Eskew: Simona Huh Other Clinician: Referring Kmari Halter: Treating Deloris Mittag/Extender:Robson, Vivi Barrack, Melvern Sample in Treatment: 1 Vital Signs Time Taken: 11:18 Temperature (F): 98.3 Height (in): 66 Pulse (bpm): 81 Weight (lbs): 258 Respiratory Rate (breaths/min): 19 Body Mass Index (BMI): 41.6 Blood Pressure (mmHg): 168/70 Reference Range: 80 - 120 mg / dl Electronic Signature(s) Signed: 04/06/2019 4:54:21 PM By: Kela Millin Entered By: Kela Millin on 04/04/2019 11:19:33

## 2019-04-11 ENCOUNTER — Encounter (HOSPITAL_BASED_OUTPATIENT_CLINIC_OR_DEPARTMENT_OTHER): Payer: Medicare Other | Admitting: Internal Medicine

## 2019-04-11 ENCOUNTER — Other Ambulatory Visit: Payer: Self-pay

## 2019-04-11 DIAGNOSIS — E11622 Type 2 diabetes mellitus with other skin ulcer: Secondary | ICD-10-CM | POA: Diagnosis not present

## 2019-04-11 NOTE — Progress Notes (Addendum)
Amanda Butler, Amanda Butler ([id number]) Visit Report for 04/11/2019 Arrival Information Details Patient Name: Date of Service: Amanda Butler, Amanda Butler 04/11/2019 12:30 PM Medical Record [id number] Patient Account Number: [id number] Date of Birth/Sex: Treating RN: 12-01-1955 (63 y.o. Orvan Falconer Primary Care Jacobb Alen: Simona Huh Other Clinician: Referring Damire Remedios: Treating Merril Isakson/Extender:Robson, Vivi Barrack, Melvern Sample in Treatment: 2 Visit Information History Since Last Visit Walker All ordered tests and consults were completed: No Patient Arrived: Added or deleted any medications: No Arrival Time: 13:22 Any new allergies or adverse reactions: No Accompanied By: self Had a fall or experienced change in No Transfer Assistance: None activities of daily living that may affect Patient Identification Verified: Yes risk of falls: Secondary Verification Process Completed: Yes Signs or symptoms of abuse/neglect since last No Patient Requires Transmission-Based No visito Precautions: Hospitalized since last visit: No Patient Has Alerts: Yes Implantable device outside of the clinic excluding No cellular tissue based products placed in the center since last visit: Has Dressing in Place as Prescribed: Yes Has Compression in Place as Prescribed: Yes Pain Present Now: No Electronic Signature(s) Signed: 04/11/2019 6:10:06 PM By: Levan Hurst RN, BSN Entered By: Levan Hurst on 04/11/2019 13:49:00 -------------------------------------------------------------------------------- Compression Therapy Details Patient Name: Date of Service: Amanda Butler 04/11/2019 12:30 PM Medical Record [id number] Patient Account Number: [id number] Date of Birth/Sex: Treating RN: 1956-03-08 (63 y.o. Nancy Fetter Primary Care Rushawn Capshaw: Simona Huh Other Clinician: Referring Zuhayr Deeney: Treating Talyia Allende/Extender:Robson, Vivi Barrack, Melvern Sample in Treatment:  2 Compression Therapy Performed for Wound Wound #4 Left,Lateral Lower Leg Assessment: Performed By: Clinician Levan Hurst, RN Compression Type: Four Layer Post Procedure Diagnosis Same as Pre-procedure Electronic Signature(s) Signed: 04/11/2019 6:10:06 PM By: Levan Hurst RN, BSN Entered By: Levan Hurst on 04/11/2019 13:49:59 -------------------------------------------------------------------------------- Encounter Discharge Information Details Patient Name: Date of Service: Amanda Butler 04/11/2019 12:30 PM Medical Record [id number] Patient Account Number: [id number] Date of Birth/Sex: Treating RN: 1956/04/02 (64 y.o. Debby Bud Primary Care Zendaya Groseclose: Simona Huh Other Clinician: Referring Rayah Fines: Treating Bernerd Terhune/Extender:Robson, Vivi Barrack, Melvern Sample in Treatment: 2 Encounter Discharge Information Items Discharge Condition: Stable Ambulatory Status: Walker Discharge Destination: Home Transportation: Private Auto Accompanied By: self Schedule Follow-up Appointment: Yes Clinical Summary of Care: Electronic Signature(s) Signed: 04/11/2019 5:44:52 PM By: Deon Pilling Entered By: Deon Pilling on 04/11/2019 14:03:11 -------------------------------------------------------------------------------- Lower Extremity Assessment Details Patient Name: Date of Service: Amanda Butler, Amanda Butler 04/11/2019 12:30 PM Medical Record [id number] Patient Account Number: [id number] Date of Birth/Sex: Treating RN: 07/01/1955 (63 y.o. Orvan Falconer Primary Care Sayyid Harewood: Simona Huh Other Clinician: Referring Bhakti Labella: Treating Hendryx Ricke/Extender:Robson, Vivi Barrack, Melvern Sample in Treatment: 2 Edema Assessment Assessed: [Left: No] [Right: No] Edema: [Left: Ye] [Right: s] Calf Left: Right: Point of Measurement: 33 cm From Medial Instep 42 cm cm Ankle Left: Right: Point of Measurement: 11 cm From Medial Instep 30 cm cm Vascular  Assessment Blood Pressure: Brachial: [Left:165] Ankle: [Left:Dorsalis Pedis: 170 1.03] Electronic Signature(s) Signed: 04/11/2019 5:45:39 PM By: Carlene Coria RN Signed: 04/11/2019 6:10:06 PM By: Levan Hurst RN, BSN Entered By: Levan Hurst on 04/11/2019 13:48:23 -------------------------------------------------------------------------------- Multi Wound Chart Details Patient Name: Date of Service: Amanda Butler 04/11/2019 12:30 PM Medical Record [id number] Patient Account Number: [id number] Date of Birth/Sex: Treating RN: 17-Sep-1955 (64 y.o. 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] Secondary Etiology: [4:N/A] [5:Trauma, Other] [N/A:N/A] Comorbid History: [4:Chronic sinus problems/congestion, Lymphedema, Asthma, Congestive Heart Failure, Congestive Heart Failure, Hypertension, Myocardial Hypertension, Myocardial Infarction, Type II Diabetes, Infarction, Type II Diabetes, Osteoarthritis,  Neuropathy, Osteoarthritis, Neuropathy, Confinement Anxiety] [5:Chronic sinus problems/congestion, Lymphedema, Asthma, Confinement Anxiety] [N/A:N/A] Date Acquired: [4:01/13/2019] [5:04/01/2019] [N/A:N/A] Weeks of Treatment: [4:2] [5:1] [N/A:N/A] Wound Status: [4:Open] [5:Open] [N/A:N/A] Measurements L x W x D 13x5x0.1 [5:0.5x0.6x0.1] [N/A:N/A] (cm) Area (cm) : [4:51.051] [5:0.236] [N/A:N/A] Volume (cm) : [4:5.105] [5:0.024]  [N/A:N/A] % Reduction in Area: [4:84.10%] [5:16.60%] [N/A:N/A] % Reduction in Volume: 84.10% [5:14.30%] [N/A:N/A] Classification: [4:Full Thickness Without Exposed Support Structures] [5:Grade 2] [N/A:N/A] Exudate Amount: [4:Medium] [5:Small] [N/A:N/A] Exudate Type: [4:Serosanguineous] [5:Serous] [N/A:N/A] Exudate Color: [4:red, brown] [5:amber] [N/A:N/A] Wound Margin: [4:Distinct, outline attached Distinct, outline attached] [N/A:N/A] Granulation Amount: [4:Large (67-100%)] [5:Large (67-100%)] [N/A:N/A] Granulation Quality: [4:Pink, Pale] [5:Pink, Pale] [N/A:N/A] Necrotic Amount: [4:Small (1-33%)] [5:None Present (0%)] [N/A:N/A] Exposed Structures: [4:Fat Layer (Subcutaneous Fat Layer (Subcutaneous Tissue) Exposed: Yes Fascia: No Tendon: No Muscle: No Joint: No Bone: No] [5:Tissue) Exposed: Yes Fascia: No Tendon: No Muscle: No Joint: No Bone: No] [N/A:N/A] Epithelialization: [4:None] [5:None N/A] [N/A:N/A N/A] Treatment Notes Electronic Signature(s) Signed: 04/11/2019 6:10:19 PM By: Linton Ham MD Entered By: Linton Ham on 04/11/2019 13:49:27 -------------------------------------------------------------------------------- Multi-Disciplinary Care Plan Details Patient Name: Date of Service: Amanda Butler. 04/11/2019 12:30 PM Medical Record [id number] Patient Account Number: [id number] Date of Birth/Sex: Treating RN: 29-Jul-1955 (63 y.o. Nancy Fetter Primary Care Rawlins Stuard: Simona Huh Other Clinician: Referring Windsor Zirkelbach: Treating Shell Yandow/Extender:Robson, Vivi Barrack, Melvern Sample in Treatment: 2 Active Inactive Nutrition Nursing Diagnoses: Impaired glucose control: actual or potential Potential for alteratiion in Nutrition/Potential for imbalanced nutrition Goals: Patient/caregiver agrees to and verbalizes understanding of need to use nutritional supplements and/or vitamins as prescribed Date Initiated: 03/28/2019 Target Resolution Date:  04/29/2019 Goal Status: Active Patient/caregiver will maintain therapeutic glucose control Date Initiated: 03/28/2019 Target Resolution Date: 04/29/2019 Goal Status: Active Interventions: Assess HgA1c results as ordered upon admission and as needed Assess patient nutrition upon admission and as needed per policy Provide education on elevated blood sugars and impact on wound healing Provide education on nutrition Treatment Activities: Education provided on Nutrition : 03/28/2019 Notes: Venous Leg Ulcer Nursing Diagnoses: Actual venous Insuffiency (use after diagnosis is confirmed) Knowledge deficit related to disease process and management Goals: Patient will maintain optimal edema control Date Initiated: 03/28/2019 Target Resolution Date: 04/29/2019 Goal Status: Active Patient/caregiver will verbalize understanding of disease process and disease management Date Initiated: 03/28/2019 Target Resolution Date: 04/29/2019 Goal Status: Active Interventions: Assess peripheral edema status every visit. Compression as ordered Provide education on venous insufficiency Notes: Wound/Skin Impairment Nursing Diagnoses: Impaired tissue integrity Knowledge deficit related to ulceration/compromised skin integrity Goals: Patient/caregiver will verbalize understanding of skin care regimen Date Initiated: 03/28/2019 Target Resolution Date: 04/29/2019 Goal Status: Active Ulcer/skin breakdown will have a volume reduction of 30% by week 4 Date Initiated: 03/28/2019 Target Resolution Date: 04/29/2019 Goal Status: Active Interventions: Assess patient/caregiver ability to obtain necessary supplies Assess patient/caregiver ability to perform ulcer/skin care regimen upon admission and as needed Assess ulceration(s) every visit Provide education on ulcer and skin care Notes: Electronic Signature(s) Signed: 04/11/2019 6:10:06 PM By: Levan Hurst RN, BSN Entered By: Levan Hurst on 04/11/2019  13:44:26 -------------------------------------------------------------------------------- Pain Assessment Details  Patient Name: Date of Service: Amanda Butler, Amanda Butler 04/11/2019 12:30 PM Medical Record [id number] Patient Account Number: [id number] Date of Birth/Sex: Treating RN: March 15, 1956 (63 y.o. Orvan Falconer Primary Care Mackensie Pilson: Simona Huh Other Clinician: Referring Taira Knabe: Treating Shantasia Hunnell/Extender:Robson, Vivi Barrack, Melvern Sample in Treatment: 2 Active Problems Location of Pain Severity and Description of Pain Patient Has Paino No Site Locations Pain Management and Medication Current Pain Management: Electronic Signature(s) Signed: 04/11/2019 5:45:39 PM By: Carlene Coria RN Entered By: Carlene Coria on 04/11/2019 13:24:35 -------------------------------------------------------------------------------- Patient/Caregiver Education Details Patient Name: Date of Service: Amanda Butler 12/28/2020andnbsp12:30 PM Medical Record [id number] Patient Account Number: [id number] Date of Birth/Gender: Treating RN: 1955/07/15 (63 y.o. Nancy Fetter Primary Care Physician: Simona Huh Other Clinician: Referring Physician: Treating Physician/Extender:Robson, Vivi Barrack, Melvern Sample in Treatment: 2 Education Assessment Education Provided To: Patient Education Topics Provided Wound/Skin Impairment: Methods: Explain/Verbal Responses: State content correctly Electronic Signature(s) Signed: 04/11/2019 6:10:06 PM By: Levan Hurst RN, BSN Entered By: Levan Hurst on 04/11/2019 13:44:36 -------------------------------------------------------------------------------- Wound Assessment Details Patient Name: Date of Service: Amanda Butler 04/11/2019 12:30 PM Medical Record [id number] Patient Account Number: [id number] Date of Birth/Sex: Treating RN: [birth year] (63 y.o. Orvan Falconer Primary Care Frayda Egley: Simona Huh Other Clinician: Referring Lyall Faciane: Treating Todd Jelinski/Extender:Robson, Vivi Barrack, Melvern Sample in Treatment: 2 Wound Status Wound Number: 4 Primary Lymphedema Etiology: Wound Location: Left Lower Leg - Lateral Wound Open Wounding Event: Gradually Appeared Status: Date Acquired: 01/13/2019 Comorbid Chronic sinus problems/congestion, Weeks Of Treatment: 2 History: Lymphedema, Asthma, Congestive Heart Clustered Wound: No Failure, Hypertension, Myocardial Infarction, Type II Diabetes, Osteoarthritis, Neuropathy, Confinement Anxiety Photos Wound Measurements Length: (cm) 13 % Reduct Width: (cm) 5 % Reduct Depth: (cm) 0.1 Epitheli Area: (cm) 51.051 Tunneli Volume: (cm) 5.105 Undermi Wound Description Classification: Full Thickness Without Exposed Support Foul Odo Structures Slough/F Wound Distinct, outline attached Margin: Exudate Medium Amount: Exudate Serosanguineous Type: Exudate red, brown Color: Wound Bed Granulation Amount: Large (67-100%) Granulation Quality: Pink, Pale Fascia E Necrotic Amount: Small (1-33%) Fat Laye Necrotic Quality: Adherent Slough Tendon E Muscle E Joint Ex Bone Exp r After Cleansing: No ibrino Yes Exposed Structure xposed: No r (Subcutaneous Tissue) Exposed: Yes xposed: No xposed: No posed: No osed: No ion in Area: 84.1% ion in Volume: 84.1% alization: None ng: No ning: No Treatment Notes Wound #4 (Left, Lateral Lower Leg) 1. Cleanse With Wound Cleanser Soap and water 2. Periwound Care Moisturizing lotion TCA Cream 3. Primary Dressing Applied Calcium Alginate Ag 4. Secondary Dressing ABD Pad Dry Gauze 6. Support Layer Applied 3 layer compression wrap Notes netting. Electronic Signature(s) Signed: 04/13/2019 3:39:15 PM By: Mikeal Hawthorne EMT/HBOT Signed: 04/13/2019 5:40:54 PM By: Carlene Coria RN Previous Signature: 04/11/2019 5:45:39 PM Version By: Carlene Coria RN Entered By: Mikeal Hawthorne on  04/13/2019 14:59:03 -------------------------------------------------------------------------------- Wound Assessment Details Patient Name: Date of Service: Amanda Butler 04/11/2019 12:30 PM Medical Record [id number] Patient Account Number: [id number] Date of Birth/Sex: Treating RN: 17-Jul-1955 (63 y.o. Orvan Falconer Primary Care Jaythan Hinely: Simona Huh Other Clinician: Referring Leonell Lobdell: Treating Tomy Khim/Extender:Robson, Vivi Barrack, Melvern Sample in Treatment: 2 Wound Status Wound Number: 5 Primary Diabetic Wound/Ulcer of the Lower Extremity Etiology: Wound Location: Right Toe Fourth Secondary Trauma, Other Wounding Event: Trauma Etiology: Date Acquired: 04/01/2019 Wound Open Weeks Of Treatment: 1 Status: Clustered Wound: No Comorbid Chronic sinus problems/congestion, History: Lymphedema, Asthma, Congestive Heart Failure, Hypertension, Myocardial Infarction, Type II Diabetes, Osteoarthritis, Neuropathy, Confinement Anxiety Photos Wound Measurements Length: (cm)  0.5 Width: (cm) 0.6 Depth: (cm) 0.1 Area: (cm) 0.236 Volume: (cm) 0.024 Wound Description Classification: Grade 2 Wound Margin: Distinct, outline attached Exudate Amount: Small Exudate Type: Serous Exudate Color: amber Wound Bed Granulation Amount: Large (67-100%) Granulation Quality: Pink, Pale Necrotic Amount: None Present (0%) After Cleansing: No brino No Exposed Structure posed: No (Subcutaneous Tissue) Exposed: Yes posed: No posed: No osed: No sed: No % Reduction in Area: 16.6% % Reduction in Volume: 14.3% Epithelialization: None Tunneling: No Undermining: No Foul Odor Slough/Fi Fascia Ex Fat Layer Tendon Ex Muscle Ex Joint Exp Bone Expo Treatment Notes Wound #5 (Right Toe Fourth) 1. Cleanse With Wound Cleanser 2. Periwound Care Skin Prep 3. Primary Dressing Applied Calcium Alginate Ag 5. Secured With Tape Notes Biochemist, clinical) Signed: 04/13/2019 3:39:15 PM By: Mikeal Hawthorne EMT/HBOT Signed: 04/13/2019 5:40:54 PM By: Carlene Coria RN Previous Signature: 04/11/2019 5:45:39 PM Version By: Carlene Coria RN Entered By: Mikeal Hawthorne on 04/13/2019 14:59:23 -------------------------------------------------------------------------------- Vitals Details Patient Name: Date of Service: Amanda Butler 04/11/2019 12:30 PM Medical Record [id number] Patient Account Number: [id number] Date of Birth/Sex: Treating RN: 08-22-1955 (63 y.o. Orvan Falconer Primary Care Alexzia Kasler: Simona Huh Other Clinician: Referring [place] Landgrebe: Treating Tyrin Herbers/Extender:Robson, Vivi Barrack, Melvern Sample in Treatment: 2 Vital Signs Time Taken: 13:23 Temperature (F): 98.6 Height (in): 66 Pulse (bpm): 73 Weight (lbs): 258 Respiratory Rate (breaths/min): 18 Body Mass Index (BMI): 41.6 Blood Pressure (mmHg): 165/65 Reference Range: 80 - 120 mg / dl Electronic Signature(s) Signed: 04/11/2019 5:45:39 PM By: Carlene Coria RN Entered By: Carlene Coria on 04/11/2019 13:24:28

## 2019-04-11 NOTE — Progress Notes (Signed)
Amanda Butler, Amanda Butler ([id number]) Visit Report for 04/11/2019 HPI Details Patient Name: Date of Service: DESSENCE, OSLER 04/11/2019 12:30 PM Medical Record [id number] Patient Account Number: [id number] Date of Birth/Sex: Treating RN: May 03, 1955 (63 y.o. F) Primary Care Provider: Simona Huh Other Clinician: Referring Provider: Treating Provider/Extender:Asaad Gulley, Vivi Barrack, Melvern Sample in Treatment: 2 History of Present Illness HPI Description: 09/09/17 on evaluation today patient presents for initial evaluation concerning a left lateral lower extremity cellulitis which she's had for about a month and has recently been treated by her primary care provider with doxycycline regarding. This is Dr. Kelton Pillar at [facility]. This last office visit was actually on 08/24/17. I did review the note today as well and the patient was placed on doxycycline for 10 days. In regard to the right buttock area patient states that nothing was performed as far as treatment is concerned and upon reviewing the noted appears that again she did not have any treatment for this region. With that being said she's been using a in the ointment she states she really cannot get any dressing to the area anyway. She does have a history of congestive heart failure, diabetes mellitus type II, and has been using an ABD an ace wrap for her left leg. The buttock. Began in December and again the leg cellulitis began one month ago. She does not really have any significant pain at either location she states in the past it was worse than it currently is at this point. 08/23/17 on evaluation today patient presents for follow-up evaluation and our clinic concerning her left lower extremity cellulitis. It appears as per the reviewed culture today that she does indeed have an infection. This culture grew out Serratia Marcescens. With that being said the doxycycline that she was previously on will not work  for this infection she is going to need something more specific to this bacterial organism. She states she's definitely okay with taking whatever is necessary to get this to clear. Otherwise there does not appear to be any evidence of a significant ulceration which is excellent news. 09/30/17 on evaluation today patient appears to be doing about the same in regard to her left lateral lower extremity cellulitis. Fortunately she only took one dose of the Cipro she had an allergic reaction to this she did not call us and she has not taken anything in the interim since that time. She states that she began to have heart palpitations in sweats. She did not go to the ER. Unfortunately she continues to show signs of continued again cellulitis in regard to her left lower extremity. She also is not tolerating the wraps very well she's nervous about doing anything topical such as gentamicin although I'm not even sure this would be the best thing for her. The only other option we really have orally would be back from DS she does not know of any specific allergies this Septra or so for the base products but again she states that she is "allergic to pretty much everything". She really would like to be admitted to the hospital for IV antibiotic therapy was she to be watched due to the severe reaction she has to most medications.Her wound culture actually grew Serratia Mercans. This again was sensitive to both Cipro as well is Bactrim. 10/21/17 on evaluation today patient actually appears to be doing very well. She was admitted to the hospital where she was placed on IV Rocephin and then subsequently discharged with [facility]. This again  is the antibiotic that I had recommended for her during my last evaluation. Nonetheless she states that she did fairly well with this without any complications. This makes both Omnicef as well is doxycycline that she is able to take she has issues with most other antibiotics.  Nonetheless she appears to be completely healed today. readmission 09/14/2018 This is a 62 year old woman who has chronic venous insufficiency with secondary lymphedema. She was seen here last year by Jeri Cos. She was discharged and healed state. She states that about a month ago she had a reopening in the left leg. She is apparently been to the ER and was given a course of antibiotics [cephalexin] on 5/9 and she was wrapped in an [employer] which caused her to have hives so this was taken off. She has a litany of complaints against any form of compression whether it is compression wraps we apply for compression stockings. She states that compression wraps we put on because her foot to go numb and then she has difficulty walking and is prone to fall. She complains about not being able to put compression stockings on. Past medical history; chronic venous insufficiency, diastolic heart failure, hypertension, hyperlipidemia, type 2 diabetes, history of a pressure ulcer on her left buttock, gastroesophageal reflux disease, obesity ABI in our clinic was noncompressible on the left. Last year this was 1.15 09/30/2018; since the patient was last here for readmission she was admitted to hospital from 6/10/17/08. This happened when there was drainage coming out of the bottom of her leg which was apparently foul-smelling. She did not know what to do she went to the ER. She was started on IV antibiotics. She was discharged home on Septra DS. The picture we looked at from her admission HandP to hospital did not really look any different from what the area looks like currently her white blood count was normal. Notable that she had a hemoglobin A1c of 9.4 her SARS coronavirus assay was negative. She arrives with nothing on the leg for the last 8 days 6/25; the patient arrives today with her juxtalite stocking for the left leg however she has as oh draining area superiorly. A lot more edema in the left leg with  Distention. I was concerned that this could all breakdown therefore with the patient's agreement we put her in a compression wrap today with the idea of transitioning her into a stocking. I think all of this is severe lymphedema/chronic venous insufficiency. 7/2; the patient arrives with all the wounds on the left leg healed. We transitioned her into a juxta lite stocking. She has chronic venous insufficiency with stasis dermatitis and secondary lymphedema. She also has very dry skin in her lower extremities. READMISSION 12/14 Patient returns to clinic today with a 1 month history of progressive skin breakdown and wound on the left lateral calf again which is essentially the same as last time. She tells me she has a lot of drainage. She stopped wearing her stockings sometime last week because it was getting saturated. Of course this is increased her edema. We discharged her on a juxta light last time on the left leg and she claims she was compliant according to the patient she has been on 3 rounds of antibiotics. She was started on Augmentin on December first completing on the 10th. She was using Xeroform and kerlix Past medical history is essentially unchanged she is a type II diabetic. She was hospitalized for cellulitis in October. She refuses an ABI because she "  cannot tolerate it" 12/21; patient comes in today with a new wound on the dorsal aspect of her fourth toe. Although she does not remember precisely how this happened it looks like a laceration she managed to avulsed part of the toenail as well 12/28 still of open area on the dorsal aspect of her fourth toe. She says it was closed until she put her shoes on without a dressing this morning. In terms of her left calf laterally and posteriorly this is still not 100% epithelialized although it is considerably better. Her edema control is marginal with 3 layers. We repeated her ABIs today and were able to get it 1.03, I think we are able  therefore to get her in 4-layer compression Electronic Signature(s) Signed: 04/11/2019 6:10:19 PM By: Linton Ham MD Entered By: Linton Ham on 04/11/2019 13:50:51 -------------------------------------------------------------------------------- Physical Exam Details Patient Name: Date of Service: [name] 04/11/2019 12:30 PM Medical Record [id number] Patient Account Number: [id number] Date of Birth/Sex: Treating RN: June 15, 1955 (63 y.o. F) Primary Care Provider: Other Clinician: Simona Huh Referring Provider: Treating Provider/Extender:Hensley Treat, Vivi Barrack, Melvern Sample in Treatment: 2 Constitutional Patient is hypertensive.. Pulse regular and within target range for patient.Marland Kitchen Respirations regular, non-labored and within target range.. Temperature is normal and within the target range for the patient.Marland Kitchen Appears in no distress. Eyes Conjunctivae clear. No discharge.no icterus. Respiratory work of breathing is normal. Cardiovascular Pedal pulses palpable on the left. Integumentary (Hair, Skin) Severe changes of chronic venous insufficiency. Psychiatric appears at normal baseline. Notes Wound exam; left lateral calf mostly epithelialized except for 2 small areas. There is no weeping edema. She has severe chronic venous insufficiency with secondary lymphedema. No evidence of surrounding cellulitis Electronic Signature(s) Signed: 04/11/2019 6:10:19 PM By: Linton Ham MD Entered By: Linton Ham on 04/11/2019 13:51:54 -------------------------------------------------------------------------------- Physician Orders Details Patient Name: Date of Service: [name] 04/11/2019 12:30 PM Medical Record [id number] Patient Account Number: [id number] Date of Birth/Sex: Treating RN: [birth year] (63 y.o. Nancy Fetter Primary Care Provider: Simona Huh Other Clinician: Referring Provider: Treating Provider/Extender:Leani Myron,  Vivi Barrack, Melvern Sample in Treatment: 2 Verbal / Phone Orders: No Diagnosis Coding ICD-10 Coding Code Description [phone] Non-pressure chronic ulcer of left calf limited to breakdown of skin I89.0 Lymphedema, not elsewhere classified I87.332 Chronic venous hypertension (idiopathic) with ulcer and inflammation of left lower extremity E11.622 Type 2 diabetes mellitus with other skin ulcer S91.311D Laceration without foreign body, right foot, subsequent encounter Follow-up Appointments Return Appointment in 1 week. Dressing Change Frequency Wound #4 Left,Lateral Lower Leg Do not change entire dressing for one week. Wound #5 Right Toe Fourth Change Dressing every other day. Skin Barriers/Peri-Wound Care Wound #4 Left,Lateral Lower Leg Moisturizing lotion TCA Cream or Ointment - mixed with lotion Wound Cleansing Wound #4 Left,Lateral Lower Leg May shower with protection. Wound #5 Right Toe Fourth Clean wound with Wound Cleanser Primary Wound Dressing Wound #4 Left,Lateral Lower Leg Calcium Alginate with Silver Wound #5 Right Toe Fourth Calcium Alginate with Silver Secondary Dressing Wound #4 Left,Lateral Lower Leg ABD pad Wound #5 Right Toe Fourth Kerlix/Rolled Gauze - or bandaid Edema Control 4 layer compression: Left lower extremity Avoid standing for long periods of time Elevate legs to the level of the heart or above for 30 minutes daily and/or when sitting, a frequency of: - throughout the day Exercise regularly Electronic Signature(s) Signed: 04/11/2019 6:10:06 PM By: Levan Hurst RN, BSN Signed: 04/11/2019 6:10:19 PM By: Linton Ham MD Entered By: Levan Hurst on  04/11/2019 13:49:31 -------------------------------------------------------------------------------- Problem List Details Patient Name: Date of Service: SAHIRAH, CHAUMONT 04/11/2019 12:30 PM Medical Record [id number] Patient Account Number: [id number] Date of Birth/Sex: Treating  RN: Jun 11, 1955 (63 y.o. F) Primary Care Provider: Simona Huh Other Clinician: Referring Provider: Treating Provider/Extender:Divonte Senger, Vivi Barrack, Melvern Sample in Treatment: 2 Active Problems ICD-10 Evaluated Encounter Code Description Active Date Today Diagnosis L97.221 Non-pressure chronic ulcer of left calf limited to 03/28/2019 No Yes breakdown of skin I89.0 Lymphedema, not elsewhere classified 03/28/2019 No Yes I87.332 Chronic venous hypertension (idiopathic) with ulcer 03/28/2019 No Yes and inflammation of left lower extremity E11.622 Type 2 diabetes mellitus with other skin ulcer 03/28/2019 No Yes S91.311D Laceration without foreign body, right foot, subsequent 04/04/2019 No Yes encounter Inactive Problems Resolved Problems Electronic Signature(s) Signed: 04/11/2019 6:10:19 PM By: Linton Ham MD Entered By: Linton Ham on 04/11/2019 13:49:20 -------------------------------------------------------------------------------- Progress Note Details Patient Name: Date of Service: [name] 04/11/2019 12:30 PM Medical Record [id number] Patient Account Number: [id number] Date of Birth/Sex: Treating RN: Apr 01, 1956 (63 y.o. F) Primary Care Provider: Simona Huh Other Clinician: Referring Provider: Treating Provider/Extender:Dorenda Pfannenstiel, Vivi Barrack, Melvern Sample in Treatment: 2 Subjective History of Present Illness (HPI) 09/09/17 on evaluation today patient presents for initial evaluation concerning a left lateral lower extremity cellulitis which she's had for about a month and has recently been treated by her primary care provider with doxycycline regarding. This is Dr. Kelton Pillar at [facility]. This last office visit was actually on 08/24/17. I did review the note today as well and the patient was placed on doxycycline for 10 days. In regard to the right buttock area patient states that nothing was performed as far as  treatment is concerned and upon reviewing the noted appears that again she did not have any treatment for this region. With that being said she's been using a in the ointment she states she really cannot get any dressing to the area anyway. She does have a history of congestive heart failure, diabetes mellitus type II, and has been using an ABD an ace wrap for her left leg. The buttock. Began in December and again the leg cellulitis began one month ago. She does not really have any significant pain at either location she states in the past it was worse than it currently is at this point. 08/23/17 on evaluation today patient presents for follow-up evaluation and our clinic concerning her left lower extremity cellulitis. It appears as per the reviewed culture today that she does indeed have an infection. This culture grew out Serratia Marcescens. With that being said the doxycycline that she was previously on will not work for this infection she is going to need something more specific to this bacterial organism. She states she's definitely okay with taking whatever is necessary to get this to clear. Otherwise there does not appear to be any evidence of a significant ulceration which is excellent news. 09/30/17 on evaluation today patient appears to be doing about the same in regard to her left lateral lower extremity cellulitis. Fortunately she only took one dose of the Cipro she had an allergic reaction to this she did not call us and she has not taken anything in the interim since that time. She states that she began to have heart palpitations in sweats. She did not go to the ER. Unfortunately she continues to show signs of continued again cellulitis in regard to her left lower extremity. She also is not tolerating the wraps very  well she's nervous about doing anything topical such as gentamicin although I'm not even sure this would be the best thing for her. The only other option we really have  orally would be back from DS she does not know of any specific allergies this Septra or so for the base products but again she states that she is "allergic to pretty much everything". She really would like to be admitted to the hospital for IV antibiotic therapy was she to be watched due to the severe reaction she has to most medications.Her wound culture actually grew Serratia Mercans. This again was sensitive to both Cipro as well is Bactrim. 10/21/17 on evaluation today patient actually appears to be doing very well. She was admitted to the hospital where she was placed on IV Rocephin and then subsequently discharged with [facility]. This again is the antibiotic that I had recommended for her during my last evaluation. Nonetheless she states that she did fairly well with this without any complications. This makes both Omnicef as well is doxycycline that she is able to take she has issues with most other antibiotics. Nonetheless she appears to be completely healed today. readmission 09/14/2018 This is a 63 year old woman who has chronic venous insufficiency with secondary lymphedema. She was seen here last year by Jeri Cos. She was discharged and healed state. She states that about a month ago she had a reopening in the left leg. She is apparently been to the ER and was given a course of antibiotics [cephalexin] on 5/9 and she was wrapped in an [employer] which caused her to have hives so this was taken off. She has a litany of complaints against any form of compression whether it is compression wraps we apply for compression stockings. She states that compression wraps we put on because her foot to go numb and then she has difficulty walking and is prone to fall. She complains about not being able to put compression stockings on. Past medical history; chronic venous insufficiency, diastolic heart failure, hypertension, hyperlipidemia, type 2 diabetes, history of a pressure ulcer on her left  buttock, gastroesophageal reflux disease, obesity ABI in our clinic was noncompressible on the left. Last year this was 1.15 09/30/2018; since the patient was last here for readmission she was admitted to hospital from 6/10/17/08. This happened when there was drainage coming out of the bottom of her leg which was apparently foul-smelling. She did not know what to do she went to the ER. She was started on IV antibiotics. She was discharged home on Septra DS. The picture we looked at from her admission HandP to hospital did not really look any different from what the area looks like currently her white blood count was normal. Notable that she had a hemoglobin A1c of 9.4 her SARS coronavirus assay was negative. She arrives with nothing on the leg for the last 8 days 6/25; the patient arrives today with her juxtalite stocking for the left leg however she has as oh draining area superiorly. A lot more edema in the left leg with Distention. I was concerned that this could all breakdown therefore with the patient's agreement we put her in a compression wrap today with the idea of transitioning her into a stocking. I think all of this is severe lymphedema/chronic venous insufficiency. 7/2; the patient arrives with all the wounds on the left leg healed. We transitioned her into a juxta lite stocking. She has chronic venous insufficiency with stasis dermatitis and secondary lymphedema. She also  has very dry skin in her lower extremities. READMISSION 12/14 Patient returns to clinic today with a 1 month history of progressive skin breakdown and wound on the left lateral calf again which is essentially the same as last time. She tells me she has a lot of drainage. She stopped wearing her stockings sometime last week because it was getting saturated. Of course this is increased her edema. We discharged her on a juxta light last time on the left leg and she claims she was compliant according to the patient she  has been on 3 rounds of antibiotics. She was started on Augmentin on December first completing on the 10th. She was using Xeroform and kerlix Past medical history is essentially unchanged she is a type II diabetic. She was hospitalized for cellulitis in October. She refuses an ABI because she "cannot tolerate it" 12/21; patient comes in today with a new wound on the dorsal aspect of her fourth toe. Although she does not remember precisely how this happened it looks like a laceration she managed to avulsed part of the toenail as well 12/28 still of open area on the dorsal aspect of her fourth toe. She says it was closed until she put her shoes on without a dressing this morning. ooIn terms of her left calf laterally and posteriorly this is still not 100% epithelialized although it is considerably better. Her edema control is marginal with 3 layers. We repeated her ABIs today and were able to get it 1.03, I think we are able therefore to get her in 4-layer compression Objective Constitutional Patient is hypertensive.. Pulse regular and within target range for patient.Marland Kitchen Respirations regular, non-labored and within target range.. Temperature is normal and within the target range for the patient.Marland Kitchen Appears in no distress. Vitals Time Taken: 1:23 PM, Height: 66 in, Weight: 258 lbs, BMI: 41.6, Temperature: 98.6 F, Pulse: 73 bpm, Respiratory Rate: 18 breaths/min, Blood Pressure: 165/65 mmHg. Eyes Conjunctivae clear. No discharge.no icterus. Respiratory work of breathing is normal. Cardiovascular Pedal pulses palpable on the left. Psychiatric appears at normal baseline. General Notes: Wound exam; left lateral calf mostly epithelialized except for 2 small areas. There is no weeping edema. She has severe chronic venous insufficiency with secondary lymphedema. No evidence of surrounding cellulitis Integumentary (Hair, Skin) Severe changes of chronic venous insufficiency. Wound #4 status is Open.  Original cause of wound was Gradually Appeared. The wound is located on the Left,Lateral Lower Leg. The wound measures 13cm length x 5cm width x 0.1cm depth; 51.051cm^2 area and 5.105cm^3 volume. There is Fat Layer (Subcutaneous Tissue) Exposed exposed. There is no tunneling or undermining noted. There is a medium amount of serosanguineous drainage noted. The wound margin is distinct with the outline attached to the wound base. There is large (67-100%) pink, pale granulation within the wound bed. There is a small (1-33%) amount of necrotic tissue within the wound bed including Adherent Slough. Wound #5 status is Open. Original cause of wound was Trauma. The wound is located on the Right Toe Fourth. The wound measures 0.5cm length x 0.6cm width x 0.1cm depth; 0.236cm^2 area and 0.024cm^3 volume. There is Fat Layer (Subcutaneous Tissue) Exposed exposed. There is no tunneling or undermining noted. There is a small amount of serous drainage noted. The wound margin is distinct with the outline attached to the wound base. There is large (67-100%) pink, pale granulation within the wound bed. There is no necrotic tissue within the wound bed. Assessment Active Problems ICD-10 Non-pressure chronic ulcer of  left calf limited to breakdown of skin Lymphedema, not elsewhere classified Chronic venous hypertension (idiopathic) with ulcer and inflammation of left lower extremity Type 2 diabetes mellitus with other skin ulcer Laceration without foreign body, right foot, subsequent encounter Procedures Wound #4 Pre-procedure diagnosis of Wound #4 is a Lymphedema located on the Left,Lateral Lower Leg . There was a Four Layer Compression Therapy Procedure by Levan Hurst, RN. Post procedure Diagnosis Wound #4: Same as Pre-Procedure Plan Follow-up Appointments: Return Appointment in 1 week. Dressing Change Frequency: Wound #4 Left,Lateral Lower Leg: Do not change entire dressing for one week. Wound #5  Right Toe Fourth: Change Dressing every other day. Skin Barriers/Peri-Wound Care: Wound #4 Left,Lateral Lower Leg: Moisturizing lotion TCA Cream or Ointment - mixed with lotion Wound Cleansing: Wound #4 Left,Lateral Lower Leg: May shower with protection. Wound #5 Right Toe Fourth: Clean wound with Wound Cleanser Primary Wound Dressing: Wound #4 Left,Lateral Lower Leg: Calcium Alginate with Silver Wound #5 Right Toe Fourth: Calcium Alginate with Silver Secondary Dressing: Wound #4 Left,Lateral Lower Leg: ABD pad Wound #5 Right Toe Fourth: Kerlix/Rolled Gauze - or bandaid Edema Control: 4 layer compression: Left lower extremity Avoid standing for long periods of time Elevate legs to the level of the heart or above for 30 minutes daily and/or when sitting, a frequency of: - throughout the day Exercise regularly 1. TCA/silver alginate under 4-layer compression 2. 4-layer compression of the right fourth toe. Counseled not to put an undressed wound on the dorsal foot and her own foot wear Electronic Signature(s) Signed: 04/11/2019 6:10:19 PM By: Linton Ham MD Entered By: Linton Ham on 04/11/2019 13:52:35 -------------------------------------------------------------------------------- SuperBill Details Patient Name: Date of Service: [name] 04/11/2019 Medical Record [id number] Patient Account Number: [id number] Date of Birth/Sex: Treating RN: [birth year] (63 y.o. F) Primary Care Provider: Simona Huh Other Clinician: Referring Provider: Treating Provider/Extender:Quinisha Mould, Vivi Barrack, Melvern Sample in Treatment: 2 Diagnosis Coding ICD-10 Codes Code Description [phone] Non-pressure chronic ulcer of left calf limited to breakdown of skin I89.0 Lymphedema, not elsewhere classified I87.332 Chronic venous hypertension (idiopathic) with ulcer and inflammation of left lower extremity E11.622 Type 2 diabetes mellitus with other skin  ulcer S91.311D Laceration without foreign body, right foot, subsequent encounter Facility Procedures CPT4 Code Description: [id number] (Facility Use Only) [phone] - [place] LWR LT LEG Modifier: Quantity: 1 Physician Procedures CPT4 Code Description: [id number] - WC PHYS LEVEL 3 - EST PT ICD-10 Diagnosis Description L97.221 Non-pressure chronic ulcer of left calf limited to breakdown I89.0 Lymphedema, not elsewhere classified Modifier: of skin Quantity: 1 Electronic Signature(s) Signed: 04/11/2019 6:10:06 PM By: Levan Hurst RN, BSN Signed: 04/11/2019 6:10:19 PM By: Linton Ham MD Entered By: Levan Hurst on 04/11/2019 17:53:29

## 2019-04-18 ENCOUNTER — Encounter (HOSPITAL_BASED_OUTPATIENT_CLINIC_OR_DEPARTMENT_OTHER): Payer: Medicare Other | Admitting: Internal Medicine

## 2019-04-19 ENCOUNTER — Ambulatory Visit: Payer: Medicare Other | Admitting: Internal Medicine

## 2019-04-26 ENCOUNTER — Encounter (HOSPITAL_COMMUNITY): Payer: Self-pay | Admitting: Emergency Medicine

## 2019-04-26 ENCOUNTER — Emergency Department (HOSPITAL_COMMUNITY)
Admission: EM | Admit: 2019-04-26 | Discharge: 2019-04-27 | Disposition: A | Discharge status: Home / Self Care | Payer: Medicare Other | Attending: Emergency Medicine | Admitting: Emergency Medicine

## 2019-04-26 ENCOUNTER — Other Ambulatory Visit: Payer: Self-pay

## 2019-04-26 DIAGNOSIS — E871 Hypo-osmolality and hyponatremia: Secondary | ICD-10-CM | POA: Diagnosis not present

## 2019-04-26 DIAGNOSIS — Z20822 Contact with and (suspected) exposure to covid-19: Secondary | ICD-10-CM | POA: Diagnosis not present

## 2019-04-26 DIAGNOSIS — Z7984 Long term (current) use of oral hypoglycemic drugs: Secondary | ICD-10-CM | POA: Insufficient documentation

## 2019-04-26 DIAGNOSIS — J45909 Unspecified asthma, uncomplicated: Secondary | ICD-10-CM | POA: Diagnosis not present

## 2019-04-26 DIAGNOSIS — E1165 Type 2 diabetes mellitus with hyperglycemia: Secondary | ICD-10-CM | POA: Insufficient documentation

## 2019-04-26 DIAGNOSIS — I10 Essential (primary) hypertension: Secondary | ICD-10-CM | POA: Diagnosis not present

## 2019-04-26 DIAGNOSIS — R519 Headache, unspecified: Secondary | ICD-10-CM | POA: Diagnosis present

## 2019-04-26 DIAGNOSIS — I509 Heart failure, unspecified: Secondary | ICD-10-CM | POA: Insufficient documentation

## 2019-04-26 DIAGNOSIS — Z79899 Other long term (current) drug therapy: Secondary | ICD-10-CM | POA: Diagnosis not present

## 2019-04-26 DIAGNOSIS — Z91040 Latex allergy status: Secondary | ICD-10-CM | POA: Diagnosis not present

## 2019-04-26 LAB — COMPREHENSIVE METABOLIC PANEL
ALT: 25 U/L (ref 0–44)
AST: 28 U/L (ref 15–41)
Albumin: 3.6 g/dL (ref 3.5–5.0)
Alkaline Phosphatase: 90 U/L (ref 38–126)
Anion gap: 10 (ref 5–15)
BUN: 14 mg/dL (ref 8–23)
CO2: 21 mmol/L — ABNORMAL LOW (ref 22–32)
Calcium: 8.7 mg/dL — ABNORMAL LOW (ref 8.9–10.3)
Chloride: 97 mmol/L — ABNORMAL LOW (ref 98–111)
Creatinine, Ser: 0.9 mg/dL (ref 0.44–1.00)
GFR calc Af Amer: >60 mL/min (ref >60)
GFR calc non Af Amer: >60 mL/min (ref >60)
Glucose, Bld: 304 mg/dL — ABNORMAL HIGH (ref 70–99)
Potassium: 3.8 mmol/L (ref 3.5–5.1)
Sodium: 128 mmol/L — ABNORMAL LOW (ref 135–145)
Total Bilirubin: 1.4 mg/dL — ABNORMAL HIGH (ref 0.3–1.2)
Total Protein: 7.2 g/dL (ref 6.5–8.1)

## 2019-04-26 LAB — CBC WITH DIFFERENTIAL/PLATELET
Abs Immature Granulocytes: 0.06 10*3/uL (ref 0.00–0.07)
Basophils Absolute: 0 10*3/uL (ref 0.0–0.1)
Basophils Relative: 1 %
Eosinophils Absolute: 0.1 10*3/uL (ref 0.0–0.5)
Eosinophils Relative: 2 %
HCT: 43.7 % (ref 36.0–46.0)
Hemoglobin: 14.7 g/dL (ref 12.0–15.0)
Immature Granulocytes: 1 %
Lymphocytes Relative: 12 %
Lymphs Abs: 0.9 10*3/uL (ref 0.7–4.0)
MCH: 29.5 pg (ref 26.0–34.0)
MCHC: 33.6 g/dL (ref 30.0–36.0)
MCV: 87.8 fL (ref 80.0–100.0)
Monocytes Absolute: 0.4 10*3/uL (ref 0.1–1.0)
Monocytes Relative: 6 %
Neutro Abs: 6 10*3/uL (ref 1.7–7.7)
Neutrophils Relative %: 78 %
Platelets: 206 10*3/uL (ref 150–400)
RBC: 4.98 MIL/uL (ref 3.87–5.11)
RDW: 13.2 % (ref 11.5–15.5)
WBC: 7.5 10*3/uL (ref 4.0–10.5)
nRBC: 0 % (ref 0.0–0.2)

## 2019-04-26 LAB — URINALYSIS, ROUTINE W REFLEX MICROSCOPIC
Bilirubin Urine: NEGATIVE
Glucose, UA: >=500 mg/dL — AB
Ketones, ur: 5 mg/dL — AB
Leukocytes,Ua: NEGATIVE
Nitrite: NEGATIVE
Protein, ur: 100 mg/dL — AB
Specific Gravity, Urine: 1.033 — ABNORMAL HIGH (ref 1.005–1.030)
pH: 6 (ref 5.0–8.0)

## 2019-04-26 NOTE — ED Triage Notes (Signed)
Patient presents with multiple complaints: Frontal headache , fatigue , body aches and blurred vision onset this morning , denies fever or chills /ambulatory, respirations unlabored .

## 2019-04-27 ENCOUNTER — Emergency Department (HOSPITAL_COMMUNITY): Payer: Medicare Other

## 2019-04-27 LAB — RESPIRATORY PANEL BY RT PCR (FLU A&B, COVID)
Influenza A by PCR: NEGATIVE
Influenza B by PCR: NEGATIVE
SARS Coronavirus 2 by RT PCR: NEGATIVE

## 2019-04-27 LAB — TROPONIN I (HIGH SENSITIVITY): Troponin I (High Sensitivity): 8 ng/L (ref <18)

## 2019-04-27 MED ORDER — HYDROCODONE-ACETAMINOPHEN 5-325 MG PO TABS
1.0000 | ORAL_TABLET | Freq: Once | ORAL | Status: DC
  Filled 2019-04-27: qty 1

## 2019-04-27 MED ORDER — SODIUM CHLORIDE 0.9 % IV BOLUS
500.0000 mL | Freq: Once | INTRAVENOUS | Status: AC
  Administered 2019-04-27: 500 mL via INTRAVENOUS

## 2019-04-27 MED ORDER — ACETAMINOPHEN 500 MG PO TABS
1000.0000 mg | ORAL_TABLET | Freq: Once | ORAL | Status: AC
  Administered 2019-04-27: 08:00:00 1000 mg via ORAL
  Filled 2019-04-27: qty 2

## 2019-04-27 NOTE — ED Notes (Signed)
Pt verbalized understanding of discharge instructions.

## 2019-04-27 NOTE — ED Provider Notes (Signed)
[facility] EMERGENCY DEPARTMENT Provider Note   CSN: [id number] Arrival date & time: 04/26/19  2108     History Chief Complaint  Patient presents with  . Headache/Fatigue/Blurred Vision    Amanda Butler is a 64 y.o. female.  Pt presents to the ED today with headache, fatigue, body aches.  She was worried she has had a stroke, but had no neurologic complaints other than the headache and a little blurred vision.  She has waited in the waiting room for several hours and sx have mostly resolved.  She still feels a little achy.  She denies f/c.  No sob or cough.  She does have DM2 and has trouble with her blood sugar chronically high.  She said she eats a diabetic diet and takes her meds as directed.  The pt does have recurrent left leg cellulitis due to venous stasis.  She said she's recently taken an antibiotic for it and now it's "coming back."        Past Medical History:  Diagnosis Date  . Arthritis    "knees" (09/13/2015)  . Asthma   . Cellulitis   . Cellulitis 11/17/2018   LEFT LEG   . Chronic diastolic CHF (congestive heart failure) ([place])    a. 09/2015 Echo: EF 55-60%, mild LVH, gr2DD, no rwma, mild MR, mildly dil LA.   Marland Kitchen GERD (gastroesophageal reflux disease)   . High cholesterol   . Hypertension   . Midsternal chest pain    a. 09/2015 MV: no ischemia/infarct, EF 61%; b. 09/2015 Low prob V:Q scan.  . Morbid obesity ([place])   . Obesity   . Pneumonia ~ 2000   "mild case"  . RBBB (right bundle branch block) approx 2010   Dr Marisue Humble did ECG and told her  . Type II diabetes mellitus [facility])     Patient Active Problem List   Diagnosis Date Noted  . Cellulitis of left leg 11/16/2018  . Accelerated hypertension 09/19/2018  . Pressure injury of skin 02/20/2018  . Cellulitis 10/02/2017  . 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  . Closed nondisplaced fracture of greater tuberosity of left humerus 04/23/2017  . Type II  diabetes mellitus ([place])   . High cholesterol   . Morbid obesity ([place])   . Chronic diastolic CHF (congestive heart failure) ([place])   . Midsternal chest pain   . Pain in the chest   . Essential hypertension   . Morbid obesity due to excess calories ([place])   . Chest pain 09/13/2015  . Bilateral lower extremity edema 09/13/2015  . Diabetes mellitus type 2, diet-controlled ([place]) 09/13/2015  . Obesity 09/13/2015  . Total bilirubin, elevated 09/13/2015  . HTN (hypertension) 09/13/2015  . Stasis dermatitis of both legs 09/13/2015  . Left leg cellulitis 09/13/2015  . RBBB 09/13/2015  . Asthma 09/13/2015  . GERD (gastroesophageal reflux disease) 09/13/2015  . 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    Past Surgical History:  Procedure Laterality Date  . BUNIONECTOMY Left   . COLONOSCOPY WITH PROPOFOL N/A 09/23/2016   Procedure: COLONOSCOPY WITH PROPOFOL;  Surgeon: Garlan Fair, MD;  Location: [facility];  Service: Endoscopy;  Laterality: N/A;  . MULTIPLE TOOTH EXTRACTIONS  ~ 1975  . TENDON RELEASE Right    Leg  . TUBAL LIGATION  ~ 1983     OB History   No obstetric history on file.     Family History  Problem Relation Age of Onset  . Diabetes Mother   .  Hypertension Mother   . Diabetes Father   . Hypertension Father   . Heart attack Father 40  . Diabetes Maternal Aunt   . Diabetes Maternal Grandmother   . Hypertension Maternal Grandmother   . Cancer Maternal Grandfather   . Diabetes Maternal Grandfather   . Hypertension Maternal Grandfather   . Asthma Paternal Grandmother   . Diabetes Paternal Grandmother   . Hypertension Paternal Grandmother   . Diabetes Paternal Grandfather   . Hypertension Paternal Grandfather   . Heart failure Paternal Grandfather     Social History   Tobacco Use  . Smoking status: Never Smoker  . Smokeless tobacco: Never Used  Substance Use Topics  . Alcohol use: No  . Drug use: No    Home Medications Prior to Admission medications    Medication Sig Start Date End Date Taking? Authorizing Provider  acetaminophen (TYLENOL) 325 MG tablet Take 650 mg by mouth every 6 (six) hours as needed for moderate pain.    [provider]  albuterol (PROVENTIL HFA;VENTOLIN HFA) 108 (90 BASE) MCG/ACT inhaler Inhale 2 puffs into the lungs 2 (two) times daily as needed for wheezing or shortness of breath.     [provider]  albuterol (PROVENTIL) (2.5 MG/3ML) 0.083% nebulizer solution Take 2.5 mg by nebulization every 6 (six) hours as needed for wheezing or shortness of breath.    [provider]  doxycycline (VIBRA-TABS) 100 MG tablet Take 1 tablet (100 mg total) by mouth 2 (two) times daily. Patient not taking: Reported on 11/16/2018 11/14/18   Robyn Haber, MD  famotidine (PEPCID) 10 MG tablet Take 10 mg by mouth daily as needed for heartburn.     [provider]  glimepiride (AMARYL) 4 MG tablet Take 1 tablet (4 mg total) by mouth every morning. 11/19/18 11/19/19  Radene Gunning, NP  lisinopril (PRINIVIL,ZESTRIL) 10 MG tablet Take 1 tablet (10 mg total) by mouth daily. Patient not taking: Reported on 11/16/2018 07/08/16   Croitoru, Mihai, MD  metoprolol succinate (TOPROL-XL) 50 MG 24 hr tablet Take 50 mg by mouth daily. Take with or immediately following a meal.    [provider]  mometasone (NASONEX) 50 MCG/ACT nasal spray Place 2 sprays into the nose daily.     [provider]  pravastatin (PRAVACHOL) 20 MG tablet Take 20 mg by mouth daily.    [provider]  repaglinide (PRANDIN) 1 MG tablet Take 1 mg by mouth See admin instructions. Take 1 tablet 15 to 30 mins before meals once a day for diabetes 08/19/18   [provider]  triamterene-hydrochlorothiazide (MAXZIDE) 75-50 MG per tablet Take 1 tablet by mouth daily.    [provider]    Allergies    Bee venom, Iodine, Shellfish allergy, Actos [pioglitazone], Aspirin, Latex, Metformin and related, Morphine and  related, Nsaids, and Ciprofloxacin  Review of Systems   Review of Systems  Constitutional: Positive for fatigue.  Musculoskeletal: Positive for myalgias.  Skin: Positive for rash.  All other systems reviewed and are negative.   Physical Exam Updated Vital Signs BP (!) 173/94   Pulse 77   Temp 98.3 F (36.8 C) (Oral)   Resp 18   SpO2 100%   Physical Exam Vitals and nursing note reviewed.  Constitutional:      Appearance: Normal appearance.  HENT:     Head: Normocephalic and atraumatic.     Right Ear: External ear normal.     Left Ear: External ear normal.  Nose: Nose normal.     Mouth/Throat:     Mouth: Mucous membranes are moist.     Pharynx: Oropharynx is clear.  Eyes:     Extraocular Movements: Extraocular movements intact.     Conjunctiva/sclera: Conjunctivae normal.     Pupils: Pupils are equal, round, and reactive to light.  Cardiovascular:     Rate and Rhythm: Normal rate and regular rhythm.     Pulses: Normal pulses.     Heart sounds: Normal heart sounds.  Pulmonary:     Effort: Pulmonary effort is normal.     Breath sounds: Normal breath sounds.  Abdominal:     General: Abdomen is flat. Bowel sounds are normal.     Palpations: Abdomen is soft.  Musculoskeletal:        General: Normal range of motion.     Cervical back: Normal range of motion and neck supple.     Comments: Chronic venous stasis.  LLE with cellulitis.  Skin:    General: Skin is warm.     Capillary Refill: Capillary refill takes less than 2 seconds.  Neurological:     General: No focal deficit present.     Mental Status: She is alert and oriented to person, place, and time.  Psychiatric:        Mood and Affect: Mood normal.        Behavior: Behavior normal.        Thought Content: Thought content normal.        Judgment: Judgment normal.     ED Results / Procedures / Treatments   Labs (all labs ordered are listed, but only abnormal results are displayed) Labs Reviewed   COMPREHENSIVE METABOLIC PANEL - Abnormal; Notable for the following components:      Result Value   Sodium 128 (*)    Chloride 97 (*)    CO2 21 (*)    Glucose, Bld 304 (*)    Calcium 8.7 (*)    Total Bilirubin 1.4 (*)    All other components within normal limits  URINALYSIS, ROUTINE W REFLEX MICROSCOPIC - Abnormal; Notable for the following components:   Specific Gravity, Urine 1.033 (*)    Glucose, UA >=500 (*)    Hgb urine dipstick SMALL (*)    Ketones, ur 5 (*)    Protein, ur 100 (*)    Bacteria, UA RARE (*)    All other components within normal limits  RESPIRATORY PANEL BY RT PCR (FLU A&B, COVID)  CBC WITH DIFFERENTIAL/PLATELET  TROPONIN I (HIGH SENSITIVITY)  TROPONIN I (HIGH SENSITIVITY)    EKG EKG Interpretation  Date/Time:  Wednesday April 27 2019 08:39:18 EST Ventricular Rate:  93 PR Interval:    QRS Duration: 127 QT Interval:  397 QTC Calculation: 494 R Axis:   39 Text Interpretation: Sinus rhythm Right bundle branch block Nonspecific T abnormalities, lateral leads No significant change since last tracing Confirmed by Isla Pence [phone]) on 04/27/2019 9:17:44 AM   Radiology DG Chest Portable 1 View  Result Date: 04/27/2019 CLINICAL DATA:  Shortness of breath EXAM: PORTABLE CHEST 1 VIEW COMPARISON:  11/16/2018 FINDINGS: Chronic cardiomegaly. Mildly low lung volumes with interstitial crowding. There is no edema, consolidation, effusion, or pneumothorax. IMPRESSION: No evidence of active disease. Electronically Signed   By: Monte Fantasia M.D.   On: 04/27/2019 08:43    Procedures Procedures (including critical care time)  Medications Ordered in ED Medications  HYDROcodone-acetaminophen (NORCO/VICODIN) 5-325 MG per tablet 1 tablet (has no administration in  time range)  acetaminophen (TYLENOL) tablet 1,000 mg (1,000 mg Oral Given 04/27/19 0824)  sodium chloride 0.9 % bolus 500 mL (500 mLs Intravenous New Bag/Given 04/27/19 0840)    ED Course  I have  reviewed the triage vital signs and the nursing notes.  Pertinent labs & imaging results that were available during my care of the patient were reviewed by me and considered in my medical decision making (see chart for details).    MDM Rules/Calculators/A&P                       Sodium is low, likely from glucose high.  Pt is worried she's had a CVA, although clinically, she has not.  She tried to do the CT scan, but had a panic attack b/c she's claustrophobic.  She refused.  I talked to her and offered to give her antianxiety meds, but she said she still does not think she could do it.  I told her that was the only way to check for a brain abnormality and she still refuses.  She knows to return if worse.  F/u with pcp.   Final Clinical Impression(s) / ED Diagnoses Final diagnoses:  Acute nonintractable headache, unspecified headache type  COVID-19 virus not detected    Rx / DC Orders ED Discharge Orders    None       Isla Pence, MD 04/27/19 1105

## 2019-04-27 NOTE — ED Notes (Signed)
Patient returned from CT

## 2019-04-29 ENCOUNTER — Telehealth: Payer: Self-pay | Admitting: *Deleted

## 2019-04-29 ENCOUNTER — Telehealth (INDEPENDENT_AMBULATORY_CARE_PROVIDER_SITE_OTHER): Payer: Medicare Other | Admitting: Cardiovascular Disease

## 2019-04-29 ENCOUNTER — Encounter: Payer: Self-pay | Admitting: Cardiovascular Disease

## 2019-04-29 DIAGNOSIS — E11620 Type 2 diabetes mellitus with diabetic dermatitis: Secondary | ICD-10-CM

## 2019-04-29 DIAGNOSIS — I10 Essential (primary) hypertension: Secondary | ICD-10-CM

## 2019-04-29 DIAGNOSIS — E7800 Pure hypercholesterolemia, unspecified: Secondary | ICD-10-CM

## 2019-04-29 DIAGNOSIS — I5032 Chronic diastolic (congestive) heart failure: Secondary | ICD-10-CM

## 2019-04-29 DIAGNOSIS — I4510 Unspecified right bundle-branch block: Secondary | ICD-10-CM | POA: Diagnosis not present

## 2019-04-29 MED ORDER — LISINOPRIL 20 MG PO TABS: 20.0000 mg | ORAL_TABLET | Freq: Every day | ORAL | 3 refills | Status: DC

## 2019-04-29 NOTE — Progress Notes (Signed)
Virtual Visit via Video Note   This visit type was conducted due to national recommendations for restrictions regarding the COVID-19 Pandemic (e.g. social distancing) in an effort to limit this patient's exposure and mitigate transmission in our community.  Due to her co-morbid illnesses, this patient is at least at moderate risk for complications without adequate follow up.  This format is felt to be most appropriate for this patient at this time.  All issues noted in this document were discussed and addressed.  A limited physical exam was performed with this format.  Please refer to the patient's chart for her consent to telehealth for [facility]   Date:  04/29/2019   ID:  Amanda Butler, DOB November 03, 1955, MRN [id number]  Patient Location: Home Provider Location: Home  PCP:  Amanda Arabian, MD  Cardiologist:  Amanda Klein, MD  Electrophysiologist:  None   Evaluation Performed:  Follow-Up Visit  Chief Complaint:  CHF  History of Present Illness:    Amanda Butler is a 64 y.o. female with chronic diastolic heart failure, poorly controlled type 2 diabetes, hypertension.  She has not had problems with shortness of breath recently, even though she has had a cough, sneezing and sinus congestion for the last 2 weeks.  She does not have orthopnea or PND.  She was seen yesterday in the emergency room for a severe headache and the coronavirus test was negative.  Her blood pressure was high and is high again today.  She was unable to have a head CT because she became intensely claustrophobic.  Chest x-ray did not show any signs of congestive heart failure.  Her electrocardiogram shows sinus rhythm and an old right bundle branch block.  She has persistent problems with swelling in her left leg.  She has had 3 scans for DVT in the last few years, all of them negative.  She gets recurrent cellulitis in that leg and has needed antibiotics about every 3 to 4 months.  Glycemic control remains  very poor.  She reports having an allergic response to Metformin and potassium Januvia is too high.  She is now on glimepiride and Prandin but her average blood glucose is around 300.  The patient does not have symptoms concerning for COVID-19 infection (fever, chills, cough, or new shortness of breath).    Past Medical History:  Diagnosis Date  . Arthritis    "knees" (09/13/2015)  . Asthma   . Cellulitis   . Cellulitis 11/17/2018   LEFT LEG   . Chronic diastolic CHF (congestive heart failure) ([place])    a. 09/2015 Echo: EF 55-60%, mild LVH, gr2DD, no rwma, mild MR, mildly dil LA.   Marland Kitchen GERD (gastroesophageal reflux disease)   . High cholesterol   . Hypertension   . Midsternal chest pain    a. 09/2015 MV: no ischemia/infarct, EF 61%; b. 09/2015 Low prob V:Q scan.  . Morbid obesity ([place])   . Obesity   . Pneumonia ~ 2000   "mild case"  . RBBB (right bundle branch block) approx 2010   Dr Amanda Butler did ECG and told her  . Type II diabetes mellitus ([place])    Past Surgical History:  Procedure Laterality Date  . BUNIONECTOMY Left   . COLONOSCOPY WITH PROPOFOL N/A 09/23/2016   Procedure: COLONOSCOPY WITH PROPOFOL;  Surgeon: Amanda Fair, MD;  Location: [facility];  Service: Endoscopy;  Laterality: N/A;  . MULTIPLE TOOTH EXTRACTIONS  ~ 1975  . TENDON RELEASE Right    Leg  .  TUBAL LIGATION  ~ 1983     Current Meds  Medication Sig  . acetaminophen (TYLENOL) 325 MG tablet Take 650 mg by mouth every 6 (six) hours as needed for moderate pain.  Marland Kitchen albuterol (PROVENTIL HFA;VENTOLIN HFA) 108 (90 BASE) MCG/ACT inhaler Inhale 2 puffs into the lungs 2 (two) times daily as needed for wheezing or shortness of breath.   Marland Kitchen albuterol (PROVENTIL) (2.5 MG/3ML) 0.083% nebulizer solution Take 2.5 mg by nebulization every 6 (six) hours as needed for wheezing or shortness of breath.  . famotidine (PEPCID) 10 MG tablet Take 10 mg by mouth daily as needed for heartburn.   Marland Kitchen glimepiride (AMARYL) 4 MG tablet Take  1 tablet (4 mg total) by mouth every morning.  Marland Kitchen lisinopril (PRINIVIL,ZESTRIL) 10 MG tablet Take 1 tablet (10 mg total) by mouth daily.  . metoprolol succinate (TOPROL-XL) 50 MG 24 hr tablet Take 50 mg by mouth daily. Take with or immediately following a meal.  . mometasone (NASONEX) 50 MCG/ACT nasal spray Place 2 sprays into the nose daily.   . repaglinide (PRANDIN) 1 MG tablet Take 1 mg by mouth See admin instructions. Take 1 tablet 15 to 30 mins before meals once a day for diabetes  . rosuvastatin (CRESTOR) 20 MG tablet Take 20 mg by mouth daily.  Marland Kitchen triamterene-hydrochlorothiazide (MAXZIDE) 75-50 MG per tablet Take 1 tablet by mouth daily.     Allergies:   Bee venom, Iodine, Shellfish allergy, Actos [pioglitazone], Aspirin, Latex, Metformin and related, Morphine and related, Nsaids, and Ciprofloxacin   Social History   Tobacco Use  . Smoking status: Never Smoker  . Smokeless tobacco: Never Used  Substance Use Topics  . Alcohol use: No  . Drug use: No     Family Hx: The patient's family history includes Asthma in her paternal grandmother; Cancer in her maternal grandfather; Diabetes in her father, maternal aunt, maternal grandfather, maternal grandmother, mother, paternal grandfather, and paternal grandmother; Heart attack (age of onset: 60) in her father; Heart failure in her paternal grandfather; Hypertension in her father, maternal grandfather, maternal grandmother, mother, paternal grandfather, and paternal grandmother.  ROS:   Please see the history of present illness.     All other systems reviewed and are negative.   Prior CV studies:   The following studies were reviewed today:  Notes from emergency room visit, including ECG, chest x-ray, labs  Labs/Other Tests and Data Reviewed:    EKG:  An ECG dated 04/27/2018 was personally reviewed today and demonstrated:  Sinus rhythm, old RBBB  Recent Labs: 04/26/2019: ALT 25; BUN 14; Creatinine, Ser 0.90; Hemoglobin 14.7;  Platelets 206; Potassium 3.8; Sodium 128   Recent Lipid Panel Lab Results  Component Value Date/Time   CHOL 158 02/20/2018 03:43 AM   TRIG 152 (H) 02/20/2018 03:43 AM   HDL 38 (L) 02/20/2018 03:43 AM   CHOLHDL 4.2 02/20/2018 03:43 AM   LDLCALC 90 02/20/2018 03:43 AM    Wt Readings from Last 3 Encounters:  04/29/19 252 lb (114.3 kg)  11/16/18 280 lb (127 kg)  09/19/18 269 lb 6.4 oz (122.2 kg)     Objective:    Vital Signs:  BP (!) 166/79   Pulse 63   Ht 5\' 6"  (1.676 m)   Wt 252 lb (114.3 kg)   BMI 40.67 kg/m    VITAL SIGNS:  reviewed GEN:  no acute distress EYES:  sclerae anicteric, EOMI - Extraocular Movements Intact RESPIRATORY:  normal respiratory effort, symmetric expansion CARDIOVASCULAR:  lower  extremity edema noted SKIN:  no rash, lesions or ulcers. MUSCULOSKELETAL:  no obvious deformities. NEURO:  alert and oriented x 3, no obvious focal deficit PSYCH:  normal affect  Severely obese.  Prominent hirsutism.  ASSESSMENT & PLAN:    1. CHF: Except for lower extremity edema, which is likely related to peripheral venous insufficiency, she does not have any findings to suggest hypervolemia.  Does not have dyspnea.  Would continue current medications which include RAAS inhibitor and beta-blocker and diuretic combo. 2. HTN: Blood pressure is markedly elevated.  Recommend increasing the lisinopril to 20 mg daily. 3. HLP: She does not have known CAD or PAD.  LDL less than 100 is acceptable, although ideally we would bring it down to less than 70. 4. DM:/Both congestive heart failure and diabetes she is an excellent candidate from an SGLT2 inhibitor.  My only concern is the ongoing issue with infection in the lower extremities.  If we can get her financial assistance, she would be a good candidate for Jardiance and Wilder Glade to be initiated when she is not having active leg infection problems.  COVID-19 Education: The signs and symptoms of COVID-19 were discussed with the  patient and how to seek care for testing (follow up with PCP or arrange E-visit).  The importance of social distancing was discussed today.  Time:   Today, I have spent 21 minutes with the patient with telehealth technology discussing the above problems.     Medication Adjustments/Labs and Tests Ordered: Current medicines are reviewed at length with the patient today.  Concerns regarding medicines are outlined above.   Tests Ordered: No orders of the defined types were placed in this encounter.   Medication Changes: No orders of the defined types were placed in this encounter.   Follow Up:  In Person 1 year  Signed, Amanda Klein, MD  04/29/2019 9:55 AM    [place]

## 2019-04-29 NOTE — Telephone Encounter (Signed)
Virtual Visit Pre-Appointment Phone Call  "(Name), I am calling you today to discuss your upcoming appointment. We are currently trying to limit exposure to the virus that causes COVID-19 by seeing patients at home rather than in the office."  1. "What is the BEST phone number to call the day of the visit?" - include this in appointment notes  2. "Do you have or have access to (through a family member/friend) a smartphone with video capability that we can use for your visit?" a. If yes - list this number in appt notes as "cell" (if different from BEST phone #) and list the appointment type as a VIDEO visit in appointment notes b. If no - list the appointment type as a PHONE visit in appointment notes  Confirm consent - "In the setting of the current Covid19 crisis, you are scheduled for a (phone or video) visit with your provider on (date) at (time).  Just as we do with many in-office visits, in order for you to participate in this visit, we must obtain consent.  If you'd like, I can send this to your mychart (if signed up) or email for you to review.  Otherwise, I can obtain your verbal consent now.  All virtual visits are billed to your insurance company just like a normal visit would be.  By agreeing to a virtual visit, we'd like you to understand that the technology does not allow for your provider to perform an examination, and thus may limit your provider's ability to fully assess your condition. If your provider identifies any concerns that need to be evaluated in person, we will make arrangements to do so.  Finally, though the technology is pretty good, we cannot assure that it will always work on either your or our end, and in the setting of a video visit, we may have to convert it to a phone-only visit.  In either situation, we cannot ensure that we have a secure connection.  Are you willing to proceed?" Yes  3. Advise patient to be prepared - "Two hours prior to your appointment, go  ahead and check your blood pressure, pulse, oxygen saturation, and your weight (if you have the equipment to check those) and write them all down. When your visit starts, your provider will ask you for this information. If you have an Apple Watch or Kardia device, please plan to have heart rate information ready on the day of your appointment. Please have a pen and paper handy nearby the day of the visit as well."  4. Give patient instructions for MyChart download to smartphone OR Doximity/Doxy.me as below if video visit (depending on what platform provider is using)  5. Inform patient they will receive a phone call 15 minutes prior to their appointment time (may be from unknown caller ID) so they should be prepared to answer    TELEPHONE CALL NOTE  Amanda Butler has been deemed a candidate for a follow-up tele-health visit to limit community exposure during the Covid-19 pandemic. I spoke with the patient via phone to ensure availability of phone/video source, confirm preferred email & phone number, and discuss instructions and expectations.  I reminded Amanda Butler to be prepared with any vital sign and/or heart rhythm information that could potentially be obtained via home monitoring, at the time of her visit. I reminded Amanda Butler to expect a phone call prior to her visit.  Ricci Barker, RN 04/29/2019 10:14 AM   INSTRUCTIONS FOR  DOWNLOADING THE MYCHART APP TO SMARTPHONE  - The patient must first make sure to have activated MyChart and know their login information - If Apple, go to [employer] and type in MyChart in the search bar and download the app. If Android, ask patient to go to [employer] and type in [place] in the search bar and download the app. The app is free but as with any other app downloads, their phone may require them to verify saved payment information or Apple/Android password.  - The patient will need to then log into the app with their MyChart username  and password, and select [facility] as their healthcare provider to link the account. When it is time for your visit, go to the MyChart app, find appointments, and click Begin Video Visit. Be sure to Select Allow for your device to access the Microphone and Camera for your visit. You will then be connected, and your provider will be with you shortly.  **If they have any issues connecting, or need assistance please contact MyChart service desk [phone])**  **If using a computer, in order to ensure the best quality for their visit they will need to use either of the following Internet Browsers: [employer], or Google Chrome**  IF USING DOXIMITY or DOXY.ME - The patient will receive a link just prior to their visit by text.     FULL LENGTH CONSENT FOR TELE-HEALTH VISIT   I hereby voluntarily request, consent and authorize [facility] and its employed or contracted physicians, physician assistants, nurse practitioners or other licensed health care professionals (the Practitioner), to provide me with telemedicine health care services (the "Services") as deemed necessary by the treating Practitioner. I acknowledge and consent to receive the Services by the Practitioner via telemedicine. I understand that the telemedicine visit will involve communicating with the Practitioner through live audiovisual communication technology and the disclosure of certain medical information by electronic transmission. I acknowledge that I have been given the opportunity to request an in-person assessment or other available alternative prior to the telemedicine visit and am voluntarily participating in the telemedicine visit.  I understand that I have the right to withhold or withdraw my consent to the use of telemedicine in the course of my care at any time, without affecting my right to future care or treatment, and that the Practitioner or I may terminate the telemedicine visit at any time. I  understand that I have the right to inspect all information obtained and/or recorded in the course of the telemedicine visit and may receive copies of available information for a reasonable fee.  I understand that some of the potential risks of receiving the Services via telemedicine include:  Marland Kitchen Delay or interruption in medical evaluation due to technological equipment failure or disruption; . Information transmitted may not be sufficient (e.g. poor resolution of images) to allow for appropriate medical decision making by the Practitioner; and/or  . In rare instances, security protocols could fail, causing a breach of personal health information.  Furthermore, I acknowledge that it is my responsibility to provide information about my medical history, conditions and care that is complete and accurate to the best of my ability. I acknowledge that Practitioner's advice, recommendations, and/or decision may be based on factors not within their control, such as incomplete or inaccurate data provided by me or distortions of diagnostic images or specimens that may result from electronic transmissions. I understand that the practice of medicine is not an exact science and that  Practitioner makes no warranties or guarantees regarding treatment outcomes. I acknowledge that I will receive a copy of this consent concurrently upon execution via email to the email address I last provided but may also request a printed copy by calling the office of [facility].    I understand that my insurance will be billed for this visit.   I have read or had this consent read to me. . I understand the contents of this consent, which adequately explains the benefits and risks of the Services being provided via telemedicine.  . I have been provided ample opportunity to ask questions regarding this consent and the Services and have had my questions answered to my satisfaction. . I give my informed consent for the services to be  provided through the use of telemedicine in my medical care  By participating in this telemedicine visit I agree to the above.

## 2019-04-29 NOTE — Patient Instructions (Signed)
Medication Instructions:  INCREASE the Lisinopril to 20 mg once daily  *If you need a refill on your cardiac medications before your next appointment, please call your pharmacy*  Lab Work: None ordered If you have labs (blood work) drawn today and your tests are completely normal, you will receive your results only by: Marland Kitchen MyChart Message (if you have MyChart) OR . A paper copy in the mail If you have any lab test that is abnormal or we need to change your treatment, we will call you to review the results.  Testing/Procedures: None ordered  Follow-Up: At [facility], you and your health needs are our priority.  As part of our continuing mission to provide you with exceptional heart care, we have created designated Provider Care Teams.  These Care Teams include your primary Cardiologist (physician) and Advanced Practice Providers (APPs -  Physician Assistants and Nurse Practitioners) who all work together to provide you with the care you need, when you need it.  Your next appointment:   12 month(s)  The format for your next appointment:   In Person  Provider:   You may see Sanda Klein, MD or one of the following Advanced Practice Providers on your designated Care Team:    Almyra Deforest, PA-C  Fabian Sharp, PA-C or   Roby Lofts, [place]

## 2019-06-29 ENCOUNTER — Ambulatory Visit: Payer: Medicare Other | Admitting: Internal Medicine

## 2019-07-18 ENCOUNTER — Other Ambulatory Visit: Payer: Self-pay

## 2019-07-18 ENCOUNTER — Encounter (HOSPITAL_BASED_OUTPATIENT_CLINIC_OR_DEPARTMENT_OTHER): Payer: Medicare Other | Attending: Internal Medicine | Admitting: Internal Medicine

## 2019-07-18 DIAGNOSIS — E11622 Type 2 diabetes mellitus with other skin ulcer: Secondary | ICD-10-CM | POA: Insufficient documentation

## 2019-07-18 DIAGNOSIS — Z6841 Body Mass Index (BMI) 40.0 and over, adult: Secondary | ICD-10-CM | POA: Insufficient documentation

## 2019-07-18 DIAGNOSIS — I5032 Chronic diastolic (congestive) heart failure: Secondary | ICD-10-CM | POA: Insufficient documentation

## 2019-07-18 DIAGNOSIS — I110 Hypertensive heart disease with heart failure: Secondary | ICD-10-CM | POA: Insufficient documentation

## 2019-07-18 DIAGNOSIS — L97221 Non-pressure chronic ulcer of left calf limited to breakdown of skin: Secondary | ICD-10-CM | POA: Insufficient documentation

## 2019-07-18 DIAGNOSIS — Z885 Allergy status to narcotic agent status: Secondary | ICD-10-CM | POA: Diagnosis not present

## 2019-07-18 DIAGNOSIS — M1990 Unspecified osteoarthritis, unspecified site: Secondary | ICD-10-CM | POA: Insufficient documentation

## 2019-07-18 DIAGNOSIS — E1140 Type 2 diabetes mellitus with diabetic neuropathy, unspecified: Secondary | ICD-10-CM | POA: Diagnosis not present

## 2019-07-18 DIAGNOSIS — Z881 Allergy status to other antibiotic agents status: Secondary | ICD-10-CM | POA: Diagnosis not present

## 2019-07-18 DIAGNOSIS — E785 Hyperlipidemia, unspecified: Secondary | ICD-10-CM | POA: Insufficient documentation

## 2019-07-18 DIAGNOSIS — I890 Lymphedema, not elsewhere classified: Secondary | ICD-10-CM | POA: Insufficient documentation

## 2019-07-18 DIAGNOSIS — J45909 Unspecified asthma, uncomplicated: Secondary | ICD-10-CM | POA: Diagnosis not present

## 2019-07-18 DIAGNOSIS — E7800 Pure hypercholesterolemia, unspecified: Secondary | ICD-10-CM | POA: Diagnosis not present

## 2019-07-18 DIAGNOSIS — Z886 Allergy status to analgesic agent status: Secondary | ICD-10-CM | POA: Diagnosis not present

## 2019-07-18 DIAGNOSIS — I252 Old myocardial infarction: Secondary | ICD-10-CM | POA: Diagnosis not present

## 2019-07-18 DIAGNOSIS — Z888 Allergy status to other drugs, medicaments and biological substances status: Secondary | ICD-10-CM | POA: Diagnosis not present

## 2019-07-18 DIAGNOSIS — L97829 Non-pressure chronic ulcer of other part of left lower leg with unspecified severity: Secondary | ICD-10-CM | POA: Diagnosis present

## 2019-07-18 DIAGNOSIS — Z8673 Personal history of transient ischemic attack (TIA), and cerebral infarction without residual deficits: Secondary | ICD-10-CM | POA: Insufficient documentation

## 2019-07-18 DIAGNOSIS — I87332 Chronic venous hypertension (idiopathic) with ulcer and inflammation of left lower extremity: Secondary | ICD-10-CM | POA: Insufficient documentation

## 2019-07-20 NOTE — Progress Notes (Signed)
Amanda Butler, Amanda Butler ([id number]) Visit Report for 07/18/2019 Allergy List Details Patient Name: Date of Service: Amanda Butler 07/18/2019 2:45 PM Medical Record [id number] Patient Account Number: [id number] Date of Birth/Sex: Treating RN: 1956/02/15 (64 y.o. Female) Carlene Coria Primary Care Tahirih Lair: Simona Huh Other Clinician: Referring Darrian Goodwill: Treating Navraj Dreibelbis/Extender:Robson, Vivi Barrack, Melvern Sample in Treatment: 0 Allergies Active Allergies aspirin latex NSAIDS (Non-Steroidal Anti-Inflammatory Drug) iodine bee venom protein (honey bee) shellfish derived morphine metformin clindamycin Reaction: hives Severity: Moderate Allergy Notes Electronic Signature(s) Signed: 07/19/2019 6:43:44 PM By: Carlene Coria RN Entered By: Carlene Coria on 07/18/2019 14:24:28 -------------------------------------------------------------------------------- Arrival Information Details Patient Name: Date of Service: Amanda Butler 07/18/2019 2:45 PM Medical Record [id number] Patient Account Number: [id number] Date of Birth/Sex: Treating RN: [birth year] (64 y.o. Female) Epps, [place] Primary Care Kais Monje: Other Clinician: Simona Huh Referring Carlon Chaloux: Treating Brantlee Hinde/Extender:Robson, Vivi Barrack, Melvern Sample in Treatment: 0 Visit Information Walker Patient Arrived: Arrival Time: 14:17 Accompanied By: self Transfer Assistance: None Patient Identification Verified: Yes Secondary Verification Process Completed: Yes Patient Requires Transmission-Based No Precautions: Patient Has Alerts: No History Since Last Visit All ordered tests and consults were completed: No Added or deleted any medications: No Any new allergies or adverse reactions: No Had a fall or experienced change in activities of daily living that may affect risk of falls: No Signs or symptoms of abuse/neglect since last visito No Hospitalized since last visit: No Implantable device  outside of the clinic excluding cellular tissue based products placed in the center since last visit: No Electronic Signature(s) Signed: 07/19/2019 6:43:44 PM By: Carlene Coria RN Entered By: Carlene Coria on 07/18/2019 14:23:20 -------------------------------------------------------------------------------- Clinic Level of Care Assessment Details Patient Name: Date of Service: Amanda Butler, Amanda Butler 07/18/2019 2:45 PM Medical Record [id number] Patient Account Number: [id number] Date of Birth/Sex: Treating RN: 03-21-1956 (64 y.o. Female) Levan Hurst Primary Care Sophia Sperry: Simona Huh Other Clinician: Referring Jameson Tormey: Treating Cynthia Stainback/Extender:Robson, Vivi Barrack, Melvern Sample in Treatment: 0 Clinic Level of Care Assessment Items TOOL 1 Quantity Score X - Use when EandM and Procedure is performed on INITIAL visit 1 0 ASSESSMENTS - Nursing Assessment / Reassessment X - General Physical Exam (combine w/ comprehensive assessment (listed just below) 1 20 when performed on new pt. evals) X - Comprehensive Assessment (HX, ROS, Risk Assessments, Wounds Hx, etc.) 1 25 ASSESSMENTS - Wound and Skin Assessment / Reassessment []  - Dermatologic / Skin Assessment (not related to wound area) 0 ASSESSMENTS - Ostomy and/or Continence Assessment and Care []  - Incontinence Assessment and Management 0 []  - Ostomy Care Assessment and Management (repouching, etc.) 0 PROCESS - Coordination of Care X - Simple Patient / Family Education for ongoing care 1 15 []  - Complex (extensive) Patient / Family Education for ongoing care 0 X - Staff obtains Programmer, systems, Records, Test Results / Process Orders 1 10 []  - Staff telephones HHA, Nursing Homes / Clarify orders / etc 0 []  - Routine Transfer to another Facility (non-emergent condition) 0 []  - Routine Hospital Admission (non-emergent condition) 0 X - New Admissions / Biomedical engineer / Ordering NPWT, Apligraf, etc. 1 15 []  - Emergency  Hospital Admission (emergent condition) 0 PROCESS - Special Needs []  - Pediatric / Minor Patient Management 0 []  - Isolation Patient Management 0 []  - Hearing / Language / Visual special needs 0 []  - Assessment of Community assistance (transportation, D/C planning, etc.) 0 []  - Additional assistance / Altered mentation 0 []  - Support Surface(s) Assessment (bed, cushion, seat, etc.) 0 INTERVENTIONS -  Miscellaneous []  - External ear exam 0 []  - Patient Transfer (multiple staff / Harrel Lemon Lift / Similar devices) 0 []  - Simple Staple / Suture removal (25 or less) 0 []  - Complex Staple / Suture removal (26 or more) 0 []  - Hypo/Hyperglycemic Management (do not check if billed separately) 0 X - Ankle / Brachial Index (ABI) - do not check if billed separately 1 15 Has the patient been seen at the hospital within the last three years: Yes Total Score: 100 Level Of Care: New/Established - Level 3 Electronic Signature(s) Signed: 07/18/2019 5:09:30 PM By: Levan Hurst RN, BSN Entered By: Levan Hurst on 07/18/2019 16:57:22 -------------------------------------------------------------------------------- Compression Therapy Details Patient Name: Date of Service: Amanda Butler 07/18/2019 2:45 PM Medical Record [id number] Patient Account Number: [id number] Date of Birth/Sex: Feb 02, 1956 (63 y.o. Female) Treating RN: Levan Hurst Primary Care Aston Lieske: Simona Huh Other Clinician: Referring Fallyn Munnerlyn: Treating Lysander Calixte/Extender:Robson, Vivi Barrack, Melvern Sample in Treatment: 0 Compression Therapy Performed for Wound Wound #6 Left,Circumferential Lower Leg Assessment: Performed By: Clinician Levan Hurst, RN Compression Type: Four Layer Post Procedure Diagnosis Same as Pre-procedure Electronic Signature(s) Signed: 07/18/2019 5:09:30 PM By: Levan Hurst RN, BSN Entered By: Levan Hurst on 07/18/2019  15:41:39 -------------------------------------------------------------------------------- Encounter Discharge Information Details Patient Name: Date of Service: Amanda Butler 07/18/2019 2:45 PM Medical Record [id number] Patient Account Number: [id number] Date of Birth/Sex: Treating RN: 10-29-1955 (64 y.o. Female) Deon Pilling Primary Care Mike Hamre: Simona Huh Other Clinician: Referring Syreeta Figler: Treating Stina Gane/Extender:Robson, Vivi Barrack, Melvern Sample in Treatment: 0 Encounter Discharge Information Items Discharge Condition: Stable Ambulatory Status: Walker Discharge Destination: Home Transportation: Private Auto Accompanied By: self Schedule Follow-up Appointment: Yes Clinical Summary of Care: Electronic Signature(s) Signed: 07/18/2019 5:03:02 PM By: Deon Pilling Entered By: Deon Pilling on 07/18/2019 15:54:03 -------------------------------------------------------------------------------- Lower Extremity Assessment Details Patient Name: Date of Service: Amanda Butler, Amanda Butler 07/18/2019 2:45 PM Medical Record [id number] Patient Account Number: [id number] Date of Birth/Sex: Treating RN: 1955/05/15 (64 y.o. Female) Carlene Coria Primary Care Shakthi Scipio: Simona Huh Other Clinician: Referring Fia Hebert: Treating Rylinn Linzy/Extender:Robson, Vivi Barrack, Melvern Sample in Treatment: 0 Edema Assessment Assessed: [Left: No] [Right: No] E[Left: dema] [Right: :] Calf Left: Right: Point of Measurement: 38 cm From Medial Instep 47 cm cm Ankle Left: Right: Point of Measurement: 10 cm From Medial Instep 31 cm cm Vascular Assessment Blood Pressure: Brachial: [Left:172] Ankle: [Left:Dorsalis Pedis: 122 0.71] Electronic Signature(s) Signed: 07/19/2019 6:43:44 PM By: Carlene Coria RN Entered By: Carlene Coria on 07/18/2019 14:44:24 -------------------------------------------------------------------------------- Multi Wound Chart Details Patient Name: Date  of Service: Amanda Butler 07/18/2019 2:45 PM Medical Record [id number] Patient Account Number: [id number] Date of Birth/Sex: Treating RN: 07-03-1955 (64 y.o. Female) Levan Hurst Primary Care Pavielle Biggar: Simona Huh Other Clinician: Referring Horatio Bertz: Treating Adithi Gammon/Extender:Robson, Vivi Barrack, Melvern Sample in Treatment: 0 Vital Signs Height(in): 65 Capillary Blood 102 Glucose(mg/dl): Weight(lbs): 252 Pulse(bpm): 109 Body Mass Index(BMI): 43 Blood Pressure(mmHg): 172/64 Temperature(F): 98.2 Respiratory 18 Rate(breaths/min): Photos: [6:No Photos] [N/A:N/A] Wound Location: [6:Left Lower Leg] [N/A:N/A] Wounding Event: [6:Gradually Appeared] [N/A:N/A] Primary Etiology: [6:Lymphedema] [N/A:N/A] Comorbid History: [6:Chronic sinus problems/congestion, Lymphedema, Asthma, Congestive Heart Failure, Hypertension, Myocardial Infarction, Type II Diabetes, Osteoarthritis, Neuropathy, Confinement Anxiety] [N/A:N/A] Date Acquired: [6:07/11/2019] [N/A:N/A] Weeks of Treatment: [6:0] [N/A:N/A] Wound Status: [6:Open] [N/A:N/A] Clustered Wound: [6:Yes] [N/A:N/A] Measurements L x W x D 19x46x0.1 [N/A:N/A] (cm) Area (cm) : [id number] [N/A:N/A] Volume (cm) : [id number] [N/A:N/A] Classification: [6:Full Thickness Without Exposed Support Structures] [N/A:N/A] Exudate Amount: [6:Large] [N/A:N/A] Exudate Type: [6:Serous] [N/A:N/A] Exudate  Color: [6:amber] [N/A:N/A] Wound Margin: [6:Flat and Intact] [N/A:N/A] Granulation Amount: [6:Medium (34-66%)] [N/A:N/A] Granulation Quality: [6:Pink] [N/A:N/A] Necrotic Amount: [6:Medium (34-66%)] [N/A:N/A] Exposed Structures: [6:Fat Layer (Subcutaneous N/A Tissue) Exposed: Yes Fascia: No Tendon: No Muscle: No Joint: No Bone: No] Epithelialization: [6:None] [N/A:N/A N/A] Treatment Notes Wound #6 (Left, Circumferential Lower Leg) 1. Cleanse With Wound Cleanser Soap and water 2. Periwound Care Moisturizing lotion TCA Cream 3.  Primary Dressing Applied Calcium Alginate Ag 4. Secondary Dressing ABD Pad 6. Support Layer Applied 4 layer compression wrap Notes netting. educated patient on lotion to right leg daily. Electronic Signature(s) Signed: 07/18/2019 5:09:30 PM By: Levan Hurst RN, BSN Signed: 07/18/2019 5:24:04 PM By: Linton Ham MD Entered By: Linton Ham on 07/18/2019 16:07:15 -------------------------------------------------------------------------------- Multi-Disciplinary Care Plan Details Patient Name: Date of Service: Amanda Butler, Amanda Butler 07/18/2019 2:45 PM Medical Record [id number] Patient Account Number: [id number] Date of Birth/Sex: Treating RN: [birth year] (64 y.o. Female) Levan Hurst Primary Care Robynne Roat: Simona Huh Other Clinician: Referring Reynol Arnone: Treating Ishaaq Penna/Extender:Robson, Vivi Barrack, Melvern Sample in Treatment: 0 Active Inactive Nutrition Nursing Diagnoses: Impaired glucose control: actual or potential Potential for alteratiion in Nutrition/Potential for imbalanced nutrition Goals: Patient/caregiver agrees to and verbalizes understanding of need to use nutritional supplements and/or vitamins as prescribed Date Initiated: 07/18/2019 Target Resolution Date: 08/19/2019 Goal Status: Active Patient/caregiver verbalizes understanding of need to maintain therapeutic glucose control per primary care physician Date Initiated: 07/18/2019 Target Resolution Date: 08/19/2019 Goal Status: Active Interventions: Assess HgA1c results as ordered upon admission and as needed Assess patient nutrition upon admission and as needed per policy Provide education on elevated blood sugars and impact on wound healing Provide education on nutrition Notes: Venous Leg Ulcer Nursing Diagnoses: Actual venous Insuffiency (use after diagnosis is confirmed) Knowledge deficit related to disease process and management Goals: Patient will maintain optimal edema control Date  Initiated: 07/18/2019 Target Resolution Date: 08/19/2019 Goal Status: Active Patient/caregiver will verbalize understanding of disease process and disease management Date Initiated: 07/18/2019 Target Resolution Date: 08/19/2019 Goal Status: Active Interventions: Assess peripheral edema status every visit. Compression as ordered Provide education on venous insufficiency Notes: Wound/Skin Impairment Nursing Diagnoses: Impaired tissue integrity Knowledge deficit related to ulceration/compromised skin integrity Goals: Patient/caregiver will verbalize understanding of skin care regimen Date Initiated: 07/18/2019 Target Resolution Date: 08/19/2019 Goal Status: Active Interventions: Assess patient/caregiver ability to obtain necessary supplies Assess patient/caregiver ability to perform ulcer/skin care regimen upon admission and as needed Assess ulceration(s) every visit Provide education on ulcer and skin care Notes: Electronic Signature(s) Signed: 07/18/2019 5:09:30 PM By: Levan Hurst RN, BSN Entered By: Levan Hurst on 07/18/2019 15:37:51 -------------------------------------------------------------------------------- Pain Assessment Details Patient Name: Date of Service: Amanda Butler 07/18/2019 2:45 PM Medical Record [id number] Patient Account Number: [id number] Date of Birth/Sex: Treating RN: 06-24-1955 (64 y.o. Female) Carlene Coria Primary Care Ezekiel Menzer: Simona Huh Other Clinician: Referring Yousef Huge: Treating Tresia Revolorio/Extender:Robson, Vivi Barrack, Melvern Sample in Treatment: 0 Active Problems Location of Pain Severity and Description of Pain Patient Has Paino Yes Site Locations With Dressing Change: Yes With Dressing Change: Yes Duration of the Pain. Constant / Intermittento Constant Rate the pain. Current Pain Level: 3 Worst Pain Level: 7 Least Pain Level: 3 Tolerable Pain Level: 5 Character of Pain Describe the Pain: Burning, Throbbing Pain  Management and Medication Current Pain Management: Medication: Yes Cold Application: No Rest: Yes Massage: No Activity: No T.E.N.S.: No Heat Application: No Leg drop or elevation: No Is the Current Pain Management Adequate: Inadequate How does your wound impact your  activities of daily livingo Sleep: Yes Bathing: No Appetite: No Relationship With Others: No Bladder Continence: No Emotions: No Bowel Continence: No Work: No Toileting: No Drive: No Dressing: No Hobbies: No Electronic Signature(s) Signed: 07/19/2019 6:43:44 PM By: Carlene Coria RN Entered By: Carlene Coria on 07/18/2019 14:46:35 -------------------------------------------------------------------------------- Patient/Caregiver Education Details Patient Name: Amanda Butler 4/5/2021andnbsp2:45 Date of Service: PM Medical Record [id number] Number: Patient Account Number: [id number] Treating RN: Date of Birth/Gender: 07/31/1955 (63 y.o. Levan Hurst Female) Other Clinician: Primary Care Physician: Simona Huh Treating Linton Ham Referring Physician: Physician/Extender: Nena Alexander in Treatment: 0 Education Assessment Education Provided To: Patient Education Topics Provided Nutrition: Methods: Explain/Verbal Responses: State content correctly Venous: Methods: Explain/Verbal Responses: State content correctly Wound/Skin Impairment: Methods: Explain/Verbal Responses: State content correctly Electronic Signature(s) Signed: 07/18/2019 5:09:30 PM By: Levan Hurst RN, BSN Entered By: Levan Hurst on 07/18/2019 15:41:08 -------------------------------------------------------------------------------- Wound Assessment Details Patient Name: Date of Service: Amanda Butler 07/18/2019 2:45 PM Medical Record [id number] Patient Account Number: [id number] Date of Birth/Sex: Treating RN: 1955-08-07 (64 y.o. Female) Epps, Morey Hummingbird Primary Care Rubyann Lingle: Simona Huh Other  Clinician: Referring Morgen Ritacco: Treating Lamonta Cypress/Extender:Robson, Vivi Barrack, Melvern Sample in Treatment: 0 Wound Status Wound Number: 6 Primary Lymphedema Etiology: Wound Location: Left Lower Leg Wound Open Wounding Event: Gradually Appeared Status: Date Acquired: 07/11/2019 Comorbid Chronic sinus problems/congestion, Weeks Of Treatment: 0 History: Lymphedema, Asthma, Congestive Heart Clustered Wound: Yes Failure, Hypertension, Myocardial Infarction, Type II Diabetes, Osteoarthritis, Neuropathy, Confinement Anxiety Wound Measurements Length: (cm) 19 % Reduct Width: (cm) 46 % Reduct Depth: (cm) 0.1 Epitheli Area: (cm) 686.438 Tunneli Volume: (cm) 68.644 Undermi Wound Description Classification: Full Thickness Without Exposed Support Foul Odo Structures Slough/F Wound Flat and Intact Margin: Exudate Large Amount: Exudate Serous Type: Exudate amber Color: Wound Bed Granulation Amount: Medium (34-66%) Granulation Quality: Pink Fascia Expo Necrotic Amount: Medium (34-66%) Fat Layer ( Necrotic Quality: Adherent Slough Tendon Expo Muscle Expo Joint Expos Bone Expose r After Cleansing: No ibrino Yes Exposed Structure sed: No Subcutaneous Tissue) Exposed: Yes sed: No sed: No ed: No d: No ion in Area: ion in Volume: alization: None ng: No ning: No Electronic Signature(s) Signed: 07/19/2019 6:43:44 PM By: Carlene Coria RN Entered By: Carlene Coria on 07/18/2019 14:45:52 -------------------------------------------------------------------------------- Vitals Details Patient Name: Date of Service: Amanda Butler 07/18/2019 2:45 PM Medical Record [id number] Patient Account Number: [id number] Date of Birth/Sex: Treating RN: 1955-06-01 (64 y.o. Female) Carlene Coria Primary Care Itxel Wickard: Simona Huh Other Clinician: Referring Rashad Auld: Treating Esdras Delair/Extender:Robson, Vivi Barrack, Melvern Sample in Treatment: 0 Vital Signs Time Taken:  14:23 Temperature (F): 98.2 Height (in): 65 Pulse (bpm): 109 Source: Stated Respiratory Rate (breaths/min): 18 Weight (lbs): 252 Blood Pressure (mmHg): 172/64 Source: Stated Capillary Blood Glucose (mg/dl): 102 Body Mass Index (BMI): 41.9 Reference Range: 80 - 120 mg / dl Notes CBG per patient Electronic Signature(s) Signed: 07/19/2019 6:43:44 PM By: Carlene Coria RN Entered By: Carlene Coria on 07/18/2019 14:24:16

## 2019-07-20 NOTE — Progress Notes (Signed)
Amanda, Butler ([id number]) Visit Report for 07/18/2019 HPI Details Patient Name: Date of Service: Amanda Butler, Amanda Butler 07/18/2019 2:45 PM Medical Record [id number] Patient Account Number: [id number] Date of Birth/Sex: Treating RN: 1956-03-22 (64 y.o. Female) Levan Hurst Primary Care Provider: Simona Huh Other Clinician: Referring Provider: Treating Provider/Extender:Aislyn Hayse, Vivi Barrack, Melvern Sample in Treatment: 0 History of Present Illness HPI Description: 09/09/17 on evaluation today patient presents for initial evaluation concerning a left lateral lower extremity cellulitis which she's had for about a month and has recently been treated by her primary care provider with doxycycline regarding. This is Dr. Kelton Pillar at [facility]. This last office visit was actually on 08/24/17. I did review the note today as well and the patient was placed on doxycycline for 10 days. In regard to the right buttock area patient states that nothing was performed as far as treatment is concerned and upon reviewing the noted appears that again she did not have any treatment for this region. With that being said she's been using a in the ointment she states she really cannot get any dressing to the area anyway. She does have a history of congestive heart failure, diabetes mellitus type II, and has been using an ABD an ace wrap for her left leg. The buttock. Began in December and again the leg cellulitis began one month ago. She does not really have any significant pain at either location she states in the past it was worse than it currently is at this point. 08/23/17 on evaluation today patient presents for follow-up evaluation and our clinic concerning her left lower extremity cellulitis. It appears as per the reviewed culture today that she does indeed have an infection. This culture grew out Serratia Marcescens. With that being said the doxycycline that she was previously on  will not work for this infection she is going to need something more specific to this bacterial organism. She states she's definitely okay with taking whatever is necessary to get this to clear. Otherwise there does not appear to be any evidence of a significant ulceration which is excellent news. 09/30/17 on evaluation today patient appears to be doing about the same in regard to her left lateral lower extremity cellulitis. Fortunately she only took one dose of the Cipro she had an allergic reaction to this she did not call us and she has not taken anything in the interim since that time. She states that she began to have heart palpitations in sweats. She did not go to the ER. Unfortunately she continues to show signs of continued again cellulitis in regard to her left lower extremity. She also is not tolerating the wraps very well she's nervous about doing anything topical such as gentamicin although I'm not even sure this would be the best thing for her. The only other option we really have orally would be back from DS she does not know of any specific allergies this Septra or so for the base products but again she states that she is "allergic to pretty much everything". She really would like to be admitted to the hospital for IV antibiotic therapy was she to be watched due to the severe reaction she has to most medications.Her wound culture actually grew Serratia Mercans. This again was sensitive to both Cipro as well is Bactrim. 10/21/17 on evaluation today patient actually appears to be doing very well. She was admitted to the hospital where she was placed on IV Rocephin and then subsequently discharged with [facility].  This again is the antibiotic that I had recommended for her during my last evaluation. Nonetheless she states that she did fairly well with this without any complications. This makes both Omnicef as well is doxycycline that she is able to take she has issues with most other  antibiotics. Nonetheless she appears to be completely healed today. readmission 09/14/2018 This is a 64 year old woman who has chronic venous insufficiency with secondary lymphedema. She was seen here last year by Jeri Cos. She was discharged and healed state. She states that about a month ago she had a reopening in the left leg. She is apparently been to the ER and was given a course of antibiotics [cephalexin] on 5/9 and she was wrapped in an [employer] which caused her to have hives so this was taken off. She has a litany of complaints against any form of compression whether it is compression wraps we apply for compression stockings. She states that compression wraps we put on because her foot to go numb and then she has difficulty walking and is prone to fall. She complains about not being able to put compression stockings on. Past medical history; chronic venous insufficiency, diastolic heart failure, hypertension, hyperlipidemia, type 2 diabetes, history of a pressure ulcer on her left buttock, gastroesophageal reflux disease, obesity ABI in our clinic was noncompressible on the left. Last year this was 1.15 09/30/2018; since the patient was last here for readmission she was admitted to hospital from 6/10/17/08. This happened when there was drainage coming out of the bottom of her leg which was apparently foul-smelling. She did not know what to do she went to the ER. She was started on IV antibiotics. She was discharged home on Septra DS. The picture we looked at from her admission HandP to hospital did not really look any different from what the area looks like currently her white blood count was normal. Notable that she had a hemoglobin A1c of 9.4 her SARS coronavirus assay was negative. She arrives with nothing on the leg for the last 8 days 6/25; the patient arrives today with her juxtalite stocking for the left leg however she has as oh draining area superiorly. A lot more edema in the  left leg with Distention. I was concerned that this could all breakdown therefore with the patient's agreement we put her in a compression wrap today with the idea of transitioning her into a stocking. I think all of this is severe lymphedema/chronic venous insufficiency. 7/2; the patient arrives with all the wounds on the left leg healed. We transitioned her into a juxta lite stocking. She has chronic venous insufficiency with stasis dermatitis and secondary lymphedema. She also has very dry skin in her lower extremities. READMISSION 12/14 Patient returns to clinic today with a 1 month history of progressive skin breakdown and wound on the left lateral calf again which is essentially the same as last time. She tells me she has a lot of drainage. She stopped wearing her stockings sometime last week because it was getting saturated. Of course this is increased her edema. We discharged her on a juxta light last time on the left leg and she claims she was compliant according to the patient she has been on 3 rounds of antibiotics. She was started on Augmentin on December first completing on the 10th. She was using Xeroform and kerlix Past medical history is essentially unchanged she is a type II diabetic. She was hospitalized for cellulitis in October. She refuses an ABI  because she "cannot tolerate it" 12/21; patient comes in today with a new wound on the dorsal aspect of her fourth toe. Although she does not remember precisely how this happened it looks like a laceration she managed to avulsed part of the toenail as well 12/28 still of open area on the dorsal aspect of her fourth toe. She says it was closed until she put her shoes on without a dressing this morning. In terms of her left calf laterally and posteriorly this is still not 100% epithelialized although it is considerably better. Her edema control is marginal with 3 layers. We repeated her ABIs today and were able to get it 1.03, I  think we are able therefore to get her in 4-layer compression READMISSION 07/18/2019 The patient we know well from previous stays in this clinic. She has chronic venous insufficiency and secondary lymphedema. We have had her here in 2019 2020 and though more recently in December 2020. She left our clinic in a nonhealed state. All of this with the wounds on her left lateral calf. She has juxta lite stockings for the left leg. She states she wore them until 2 weeks ago when she noticed swelling in her leg and a small open area to the open area has expanded and the edema control is deteriorated. She now has an almost circumferential wound in the left calf. Past medical history; type 2 diabetes with most recent hemoglobin A1c of 9.2 on 8/20, cellulitis recurrently, chronic venous insufficiency with secondary lymphedema, hypertension, hypercholesterolemia DVT rule outs have been negative. She is apparently allergic to [employer]. ABI in our clinic today was 0.71 however in December this was 1.03. Electronic Signature(s) Signed: 07/18/2019 5:24:04 PM By: Linton Ham MD Entered By: Linton Ham on 07/18/2019 16:09:52 -------------------------------------------------------------------------------- Physical Exam Details Patient Name: Date of Service: Amanda Butler 07/18/2019 2:45 PM Medical Record [id number] Patient Account Number: [id number] Date of Birth/Sex: Treating RN: 1956-03-01 (64 y.o. Female) Levan Hurst Primary Care Provider: Simona Huh Other Clinician: Referring Provider: Treating Provider/Extender:Leslea Vowles, Vivi Barrack, Melvern Sample in Treatment: 0 Constitutional Patient is hypertensive.. Pulse regular and within target range for patient.Marland Kitchen Respirations regular, non-labored and within target range.. Temperature is normal and within the target range for the patient.Marland Kitchen Appears in no distress. Respiratory work of breathing is normal. Cardiovascular Midsystolic  murmur. No radiation. She appears to be euvolemic.. pedal pulses are palpable. Edema present in both extremities.. Integumentary (Hair, Skin) S. Greenlee dry excoriated skin in her bilateral lower extremities.Marland Kitchen Psychiatric appears at normal baseline. Notes Wound exam; the problem is on the left calf. Almost circumferential skin breakdown laterally to medially posteriorly. She is still intact anteriorly. This area is tender but I see no evidence of infection. Her skin is extremely unkempt. Electronic Signature(s) Signed: 07/18/2019 5:24:04 PM By: Linton Ham MD Entered By: Linton Ham on 07/18/2019 16:12:12 -------------------------------------------------------------------------------- Physician Orders Details Patient Name: Date of Service: Amanda Butler 07/18/2019 2:45 PM Medical Record [id number] Patient Account Number: [id number] Date of Birth/Sex: Treating RN: 01-Jun-1955 (64 y.o. Female) Levan Hurst Primary Care Provider: Simona Huh Other Clinician: Referring Provider: Treating Provider/Extender:Gaige Sebo, Vivi Barrack, Melvern Sample in Treatment: 0 Verbal / Phone Orders: No Diagnosis Coding Follow-up Appointments Return Appointment in 1 week. Dressing Change Frequency Wound #6 Left,Circumferential Lower Leg Do not change entire dressing for one week. Skin Barriers/Peri-Wound Care Wound #6 Left,Circumferential Lower Leg Moisturizing lotion TCA Cream or Ointment - mixed with lotion Wound Cleansing May shower with protection. Primary Wound  Dressing Wound #6 Left,Circumferential Lower Leg Calcium Alginate with Silver Secondary Dressing Wound #6 Left,Circumferential Lower Leg ABD pad Edema Control 4 layer compression: Left lower extremity Avoid standing for long periods of time Elevate legs to the level of the heart or above for 30 minutes daily and/or when sitting, a frequency of: - throughout the day Exercise regularly Electronic  Signature(s) Signed: 07/18/2019 5:09:30 PM By: Levan Hurst RN, BSN Signed: 07/18/2019 5:24:04 PM By: Linton Ham MD Entered By: Levan Hurst on 07/18/2019 15:39:59 -------------------------------------------------------------------------------- Problem List Details Patient Name: Date of Service: Amanda Butler 07/18/2019 2:45 PM Medical Record [id number] Patient Account Number: [id number] Date of Birth/Sex: Treating RN: 13-May-1955 (64 y.o. Female) Levan Hurst Primary Care Provider: Simona Huh Other Clinician: Referring Provider: Treating Provider/Extender:Aqeel Norgaard, Vivi Barrack, Melvern Sample in Treatment: 0 Active Problems ICD-10 Evaluated Encounter Code Description Active Date Today Diagnosis I87.332 Chronic venous hypertension (idiopathic) with ulcer 07/18/2019 No Yes and inflammation of left lower extremity I89.0 Lymphedema, not elsewhere classified 07/18/2019 No Yes L97.221 Non-pressure chronic ulcer of left calf limited to 07/18/2019 No Yes breakdown of skin E11.622 Type 2 diabetes mellitus with other skin ulcer 07/18/2019 No Yes Inactive Problems Resolved Problems Electronic Signature(s) Signed: 07/18/2019 5:24:04 PM By: Linton Ham MD Entered By: Linton Ham on 07/18/2019 15:58:46 -------------------------------------------------------------------------------- Progress Note Details Patient Name: Date of Service: Amanda Butler 07/18/2019 2:45 PM Medical Record [id number] Patient Account Number: [id number] Date of Birth/Sex: Treating RN: 07/26/1955 (64 y.o. Female) Levan Hurst Primary Care Provider: Simona Huh Other Clinician: Referring Provider: Treating Provider/Extender:Laurana Magistro, Vivi Barrack, Melvern Sample in Treatment: 0 Subjective History of Present Illness (HPI) 09/09/17 on evaluation today patient presents for initial evaluation concerning a left lateral lower extremity cellulitis which she's had for about a month and  has recently been treated by her primary care provider with doxycycline regarding. This is Dr. Kelton Pillar at [facility]. This last office visit was actually on 08/24/17. I did review the note today as well and the patient was placed on doxycycline for 10 days. In regard to the right buttock area patient states that nothing was performed as far as treatment is concerned and upon reviewing the noted appears that again she did not have any treatment for this region. With that being said she's been using a in the ointment she states she really cannot get any dressing to the area anyway. She does have a history of congestive heart failure, diabetes mellitus type II, and has been using an ABD an ace wrap for her left leg. The buttock. Began in December and again the leg cellulitis began one month ago. She does not really have any significant pain at either location she states in the past it was worse than it currently is at this point. 08/23/17 on evaluation today patient presents for follow-up evaluation and our clinic concerning her left lower extremity cellulitis. It appears as per the reviewed culture today that she does indeed have an infection. This culture grew out Serratia Marcescens. With that being said the doxycycline that she was previously on will not work for this infection she is going to need something more specific to this bacterial organism. She states she's definitely okay with taking whatever is necessary to get this to clear. Otherwise there does not appear to be any evidence of a significant ulceration which is excellent news. 09/30/17 on evaluation today patient appears to be doing about the same in regard to her left lateral lower extremity cellulitis.  Fortunately she only took one dose of the Cipro she had an allergic reaction to this she did not call us and she has not taken anything in the interim since that time. She states that she began to have heart palpitations  in sweats. She did not go to the ER. Unfortunately she continues to show signs of continued again cellulitis in regard to her left lower extremity. She also is not tolerating the wraps very well she's nervous about doing anything topical such as gentamicin although I'm not even sure this would be the best thing for her. The only other option we really have orally would be back from DS she does not know of any specific allergies this Septra or so for the base products but again she states that she is "allergic to pretty much everything". She really would like to be admitted to the hospital for IV antibiotic therapy was she to be watched due to the severe reaction she has to most medications.Her wound culture actually grew Serratia Mercans. This again was sensitive to both Cipro as well is Bactrim. 10/21/17 on evaluation today patient actually appears to be doing very well. She was admitted to the hospital where she was placed on IV Rocephin and then subsequently discharged with [facility] This again is the antibiotic that I had recommended for her during my last evaluation. Nonetheless she states that she did fairly well with this without any complications. This makes both Omnicef as well is doxycycline that she is able to take she has issues with most other antibiotics. Nonetheless she appears to be completely healed today. readmission 09/14/2018 This is a 64 year old woman who has chronic venous insufficiency with secondary lymphedema. She was seen here last year by Jeri Cos. She was discharged and healed state. She states that about a month ago she had a reopening in the left leg. She is apparently been to the ER and was given a course of antibiotics [cephalexin] on 5/9 and she was wrapped in an [employer] which caused her to have hives so this was taken off. She has a litany of complaints against any form of compression whether it is compression wraps we apply for compression stockings. She  states that compression wraps we put on because her foot to go numb and then she has difficulty walking and is prone to fall. She complains about not being able to put compression stockings on. Past medical history; chronic venous insufficiency, diastolic heart failure, hypertension, hyperlipidemia, type 2 diabetes, history of a pressure ulcer on her left buttock, gastroesophageal reflux disease, obesity ABI in our clinic was noncompressible on the left. Last year this was 1.15 09/30/2018; since the patient was last here for readmission she was admitted to hospital from 6/10/17/08. This happened when there was drainage coming out of the bottom of her leg which was apparently foul-smelling. She did not know what to do she went to the ER. She was started on IV antibiotics. She was discharged home on Septra DS. The picture we looked at from her admission HandP to hospital did not really look any different from what the area looks like currently her white blood count was normal. Notable that she had a hemoglobin A1c of 9.4 her SARS coronavirus assay was negative. She arrives with nothing on the leg for the last 8 days 6/25; the patient arrives today with her juxtalite stocking for the left leg however she has as oh draining area superiorly. A lot more edema in the left leg  with Distention. I was concerned that this could all breakdown therefore with the patient's agreement we put her in a compression wrap today with the idea of transitioning her into a stocking. I think all of this is severe lymphedema/chronic venous insufficiency. 7/2; the patient arrives with all the wounds on the left leg healed. We transitioned her into a juxta lite stocking. She has chronic venous insufficiency with stasis dermatitis and secondary lymphedema. She also has very dry skin in her lower extremities. READMISSION 12/14 Patient returns to clinic today with a 1 month history of progressive skin breakdown and wound on the  left lateral calf again which is essentially the same as last time. She tells me she has a lot of drainage. She stopped wearing her stockings sometime last week because it was getting saturated. Of course this is increased her edema. We discharged her on a juxta light last time on the left leg and she claims she was compliant according to the patient she has been on 3 rounds of antibiotics. She was started on Augmentin on December first completing on the 10th. She was using Xeroform and kerlix Past medical history is essentially unchanged she is a type II diabetic. She was hospitalized for cellulitis in October. She refuses an ABI because she "cannot tolerate it" 12/21; patient comes in today with a new wound on the dorsal aspect of her fourth toe. Although she does not remember precisely how this happened it looks like a laceration she managed to avulsed part of the toenail as well 12/28 still of open area on the dorsal aspect of her fourth toe. She says it was closed until she put her shoes on without a dressing this morning. ooIn terms of her left calf laterally and posteriorly this is still not 100% epithelialized although it is considerably better. Her edema control is marginal with 3 layers. We repeated her ABIs today and were able to get it 1.03, I think we are able therefore to get her in 4-layer compression READMISSION 07/18/2019 The patient we know well from previous stays in this clinic. She has chronic venous insufficiency and secondary lymphedema. We have had her here in 2019 2020 and though more recently in December 2020. She left our clinic in a nonhealed state. All of this with the wounds on her left lateral calf. She has juxta lite stockings for the left leg. She states she wore them until 2 weeks ago when she noticed swelling in her leg and a small open area to the open area has expanded and the edema control is deteriorated. She now has an almost circumferential wound in the  left calf. Past medical history; type 2 diabetes with most recent hemoglobin A1c of 9.2 on 8/20, cellulitis recurrently, chronic venous insufficiency with secondary lymphedema, hypertension, hypercholesterolemia DVT rule outs have been negative. She is apparently allergic to [employer]. ABI in our clinic today was 0.71 however in December this was 1.03. Patient History Information obtained from Patient. Allergies aspirin, latex, NSAIDS (Non-Steroidal Anti-Inflammatory Drug), iodine, bee venom protein (honey bee), shellfish derived, morphine, metformin, clindamycin (Severity: Moderate, Reaction: hives) Family History Cancer - Maternal Grandparents,Mother, Diabetes - Maternal Grandparents,Paternal Grandparents,Mother, [place], Hypertension - Mother, No family history of Hereditary Spherocytosis, Kidney Disease, Lung Disease, Seizures, Stroke, Thyroid Problems, Tuberculosis. Social History Never smoker, Marital Status - Widowed, Alcohol Use - Never, Drug Use - No History, Caffeine Use - Moderate. Medical History Eyes Denies history of Cataracts, Glaucoma, Optic Neuritis Ear/Nose/Mouth/Throat Patient has  history of Chronic sinus problems/congestion Denies history of Middle ear problems Hematologic/Lymphatic Patient has history of Lymphedema Respiratory Patient has history of Asthma Cardiovascular Patient has history of Congestive Heart Failure, Hypertension, Myocardial Infarction Endocrine Patient has history of Type II Diabetes Denies history of Type I Diabetes Immunological Denies history of Lupus Erythematosus, Raynaudoos, Scleroderma Integumentary (Skin) Denies history of History of Burn Musculoskeletal Patient has history of Osteoarthritis Denies history of Gout, Rheumatoid Arthritis Neurologic Patient has history of Neuropathy Denies history of Dementia, Quadriplegia, Paraplegia, Seizure Disorder Oncologic Denies history of Received Chemotherapy,  Received Radiation Psychiatric Patient has history of Confinement Anxiety Denies history of Anorexia/bulimia Hospitalization/Surgery History - questionable MI. - tubal ligation. - bunionectomy. - left leg wound inpatient 09/22/2018 x3 days for IV ABX. Medical And Surgical History Notes Constitutional Symptoms (General Health) morbid obesity Cardiovascular hyperlipidemia stroke Gastrointestinal GERD Psychiatric anxiety Review of Systems (ROS) Constitutional Symptoms (General Health) Denies complaints or symptoms of Fatigue, Fever, Chills, Marked Weight Change. Eyes Denies complaints or symptoms of Dry Eyes, Vision Changes, Glasses / Contacts. Ear/Nose/Mouth/Throat Denies complaints or symptoms of Chronic sinus problems or rhinitis. Respiratory Denies complaints or symptoms of Chronic or frequent coughs, Shortness of Breath. Cardiovascular Denies complaints or symptoms of Chest pain. Gastrointestinal Denies complaints or symptoms of Frequent diarrhea, Nausea, Vomiting. Endocrine Denies complaints or symptoms of Heat/cold intolerance. Genitourinary Denies complaints or symptoms of Frequent urination. Integumentary (Skin) Complains or has symptoms of Wounds. Musculoskeletal Denies complaints or symptoms of Muscle Pain, Muscle Weakness. Neurologic Denies complaints or symptoms of Numbness/parasthesias. Psychiatric Denies complaints or symptoms of Claustrophobia, Suicidal. Objective Constitutional Patient is hypertensive.. Pulse regular and within target range for patient.Marland Kitchen Respirations regular, non-labored and within target range.. Temperature is normal and within the target range for the patient.Marland Kitchen Appears in no distress. Vitals Time Taken: 2:23 PM, Height: 65 in, Source: Stated, Weight: 252 lbs, Source: Stated, BMI: 41.9, Temperature: 98.2 F, Pulse: 109 bpm, Respiratory Rate: 18 breaths/min, Blood Pressure: 172/64 mmHg, Capillary Blood Glucose: 102 mg/dl. General Notes:  CBG per patient Respiratory work of breathing is normal. Cardiovascular Midsystolic murmur. No radiation. She appears to be euvolemic.. pedal pulses are palpable. Edema present in both extremities.Marland Kitchen Psychiatric appears at normal baseline. General Notes: Wound exam; the problem is on the left calf. Almost circumferential skin breakdown laterally to medially posteriorly. She is still intact anteriorly. This area is tender but I see no evidence of infection. Her skin is extremely unkempt. Integumentary (Hair, Skin) S. Greenlee dry excoriated skin in her bilateral lower extremities.. Wound #6 status is Open. Original cause of wound was Gradually Appeared. The wound is located on the Left,Circumferential Lower Leg. The wound measures 19cm length x 46cm width x 0.1cm depth; 686.438cm^2 area and 68.644cm^3 volume. There is Fat Layer (Subcutaneous Tissue) Exposed exposed. There is no tunneling or undermining noted. There is a large amount of serous drainage noted. The wound margin is flat and intact. There is medium (34-66%) pink granulation within the wound bed. There is a medium (34-66%) amount of necrotic tissue within the wound bed including Adherent Slough. 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 There was a Four Layer Compression Therapy Procedure by Levan Hurst, RN. Post procedure Diagnosis Wound #6:  Same as Pre-Procedure Plan Follow-up Appointments: Return Appointment in 1 week. Dressing Change Frequency: Wound #6 Left,Circumferential Lower Leg: Do not change entire dressing for one week. Skin Barriers/Peri-Wound  Care: Wound #6 Left,Circumferential Lower Leg: Moisturizing lotion TCA Cream or Ointment - mixed with lotion Wound Cleansing: May shower with protection. Primary Wound Dressing: Wound #6 Left,Circumferential Lower Leg: Calcium Alginate with Silver Secondary Dressing: Wound #6 Left,Circumferential Lower Leg: ABD pad Edema Control: 4 layer compression: Left lower extremity Avoid standing for long periods of time Elevate legs to the level of the heart or above for 30 minutes daily and/or when sitting, a frequency of: - throughout the day Exercise regularly 1. We are going to use silver alginate/ABDs under 4-layer compression 2. She has marked edema in the left leg which is not unusual for her. I am extremely doubtful that she has anything like an acute DVT for which she has had several prior DVT studies which have all been negative. 3. She tells me that she is was wearing her stocking up to 2 weeks ago, I am very doubtful that this is true. She does not have compression pumps at home but her compliance with stockings is so poor I am reluctant to order such an expensive piece of equipment 4. I see no evidence of infection Electronic Signature(s) Signed: 07/18/2019 5:24:04 PM By: Linton Ham MD Entered By: Linton Ham on 07/18/2019 16:14:03 -------------------------------------------------------------------------------- HxROS Details Patient Name: Date of Service: Amanda Butler 07/18/2019 2:45 PM Medical Record [id number] Patient Account Number: [id number] Date of Birth/Sex: Treating RN: [birth year] (64 y.o. Female) Carlene Coria Primary Care Provider: Simona Huh Other Clinician: Referring Provider: Treating Provider/Extender:Eriyana Sweeten, Vivi Barrack, Melvern Sample in Treatment: 0 Information Obtained From Patient Constitutional Symptoms (General Health) Complaints and Symptoms: Negative for: Fatigue; Fever; Chills; Marked Weight Change Medical History: Past  Medical History Notes: morbid obesity Eyes Complaints and Symptoms: Negative for: Dry Eyes; Vision Changes; Glasses / Contacts Medical History: Negative for: Cataracts; Glaucoma; Optic Neuritis Ear/Nose/Mouth/Throat Complaints and Symptoms: Negative for: Chronic sinus problems or rhinitis Medical History: Positive for: Chronic sinus problems/congestion Negative for: Middle ear problems Respiratory Complaints and Symptoms: Negative for: Chronic or frequent coughs; Shortness of Breath Medical History: Positive for: Asthma Cardiovascular Complaints and Symptoms: Negative for: Chest pain Medical History: Positive for: Congestive Heart Failure; Hypertension; Myocardial Infarction Past Medical History Notes: hyperlipidemia stroke Gastrointestinal Complaints and Symptoms: Negative for: Frequent diarrhea; Nausea; Vomiting Medical History: Past Medical History Notes: GERD Endocrine Complaints and Symptoms: Negative for: Heat/cold intolerance Medical History: Positive for: Type II Diabetes Negative for: Type I Diabetes Time with diabetes: 5 years Treated with: Oral agents Blood sugar tested every day: No Genitourinary Complaints and Symptoms: Negative for: Frequent urination Integumentary (Skin) Complaints and Symptoms: Positive for: Wounds Medical History: Negative for: History of Burn Musculoskeletal Complaints and Symptoms: Negative for: Muscle Pain; Muscle Weakness Medical History: Positive for: Osteoarthritis Negative for: Gout; Rheumatoid Arthritis Neurologic Complaints and Symptoms: Negative for: Numbness/parasthesias Medical History: Positive for: Neuropathy Negative for: Dementia; Quadriplegia; Paraplegia; Seizure Disorder Psychiatric Complaints and Symptoms: Negative for: Claustrophobia; Suicidal Medical History: Positive for: Confinement Anxiety Negative for: Anorexia/bulimia Past Medical History Notes: anxiety Hematologic/Lymphatic Medical  History: Positive for: Lymphedema Immunological Medical History: Negative for: Lupus Erythematosus; Raynauds; Scleroderma Oncologic Medical History: Negative for: Received Chemotherapy; Received Radiation HBO Extended History Items Ear/Nose/Mouth/Throat: Chronic sinus problems/congestion Immunizations Pneumococcal Vaccine: Received Pneumococcal Vaccination: No Implantable Devices None Hospitalization / Surgery History Type of Hospitalization/Surgery questionable [place] tubal ligation bunionectomy left leg  wound inpatient 09/22/2018 x3 days for IV ABX Family and Social History Cancer: Yes - Maternal Grandparents,Mother; Diabetes: Yes - Maternal Grandparents,Paternal Grandparents,Mother; Heart Disease: Yes - Mother,Father; Hereditary Spherocytosis: No; Hypertension: Yes - Mother; Kidney Disease: No; Lung Disease: No; Seizures: No; Stroke: No; Thyroid Problems: No; Tuberculosis: No; Never smoker; Marital Status - Widowed; Alcohol Use: Never; Drug Use: No History; Caffeine Use: Moderate; Financial Concerns: No; Food, Clothing or Shelter Needs: No; Support System Lacking: No; Transportation Concerns: No Electronic Signature(s) Signed: 07/18/2019 5:24:04 PM By: Linton Ham MD Signed: 07/19/2019 6:43:44 PM By: Carlene Coria RN Entered By: Carlene Coria on 07/18/2019 14:25:47 -------------------------------------------------------------------------------- [place] Details Patient Name: Date of Service: Amanda Butler 07/18/2019 Medical Record [id number] Patient Account Number: [id number] Date of Birth/Sex: Treating RN: [birth year] (64 y.o. Female) Levan Hurst Primary Care Provider: Simona Huh Other Clinician: Referring Provider: Treating Provider/Extender:Aaidyn San, Vivi Barrack, Melvern Sample in Treatment: 0 Diagnosis Coding ICD-10 Codes Code Description [phone] Chronic venous hypertension (idiopathic) with ulcer and inflammation of left lower extremity I89.0  Lymphedema, not elsewhere classified L97.221 Non-pressure chronic ulcer of left calf limited to breakdown of skin E11.622 Type 2 diabetes mellitus with other skin ulcer Facility Procedures CPT4 Code Description: [id number] - WOUND CARE VISIT-LEV 3 EST PT Modifier: 25 Quantity: 1 CPT4 Code Description: [id number] (Facility Use Only) [id number] - APPLY [place] LWR LT LEG Modifier: Quantity: 1 Physician Procedures CPT4 Code Description: [id number] [place] - WC PHYS LEVEL 4 - EST PT ICD-10 Diagnosis Description I87.332 Chronic venous hypertension (idiopathic) with ulcer and in extremity I89.0 Lymphedema, not elsewhere classified L97.221 Non-pressure chronic ulcer of  left calf limited to breakdo E11.622 Type 2 diabetes mellitus with other skin ulcer Modifier: flammation of lef wn of skin Quantity: 1 t lower Electronic Signature(s) Signed: 07/18/2019 5:09:30 PM By: Levan Hurst RN, BSN Signed: 07/18/2019 5:24:04 PM By: Linton Ham MD Entered By: Levan Hurst on 07/18/2019 16:58:40

## 2019-07-20 NOTE — Progress Notes (Signed)
CHAI, COTRONE ([id number]) Visit Report for 07/18/2019 Abuse/Suicide Risk Screen Details Patient Name: Date of Service: Amanda Butler, Amanda Butler 07/18/2019 2:45 PM Medical Record [id number] Patient Account Number: [id number] Date of Birth/Sex: Treating RN: 05-Jun-1955 (64 y.o. Female) Carlene Coria Primary Care Avigayil Ton: Simona Huh Other Clinician: Referring Jean Alejos: Treating Khala Tarte/Extender:Robson, Vivi Barrack, Melvern Sample in Treatment: 0 Abuse/Suicide Risk Screen Items Answer ABUSE RISK SCREEN: Has anyone close to you tried to hurt or harm you recentlyo No Do you feel uncomfortable with anyone in your familyo No Has anyone forced you do things that you didnt want to doo No Electronic Signature(s) Signed: 07/19/2019 6:43:44 PM By: Carlene Coria RN Entered By: Carlene Coria on 07/18/2019 14:25:55 -------------------------------------------------------------------------------- Activities of Daily Living Details Patient Name: Date of Service: Amanda Butler, Amanda Butler 07/18/2019 2:45 PM Medical Record [id number] Patient Account Number: [id number] Date of Birth/Sex: Treating RN: 12-Nov-1955 (64 y.o. Female) Carlene Coria Primary Care Tucker Minter: Simona Huh Other Clinician: Referring Tamari Busic: Treating Ashan Cueva/Extender:Robson, Vivi Barrack, Melvern Sample in Treatment: 0 Activities of Daily Living Items Answer Activities of Daily Living (Please select one for each item) Drive Automobile Completely Able Take Medications Completely Able Use Telephone Completely Able Care for Appearance Completely Able Use Toilet Completely Able Bath / Shower Completely Able Dress Self Completely Able Feed Self Completely Able Walk Completely Able Get In / Out Bed Completely Able Housework Completely Able Prepare Meals Completely Able Handle Money Completely Able Shop for Self Completely Able Electronic Signature(s) Signed: 07/19/2019 6:43:44 PM By: Carlene Coria RN Entered By:  Carlene Coria on 07/18/2019 14:26:21 -------------------------------------------------------------------------------- Education Screening Details Patient Name: Date of Service: Amanda Butler 07/18/2019 2:45 PM Medical Record [id number] Patient Account Number: [id number] Date of Birth/Sex: Treating RN: 01-21-1956 (64 y.o. Female) Carlene Coria Primary Care [place]ee Nase: Simona Huh Other Clinician: Referring Shatina Streets: Treating Erie Sica/Extender:Robson, Vivi Barrack, Melvern Sample in Treatment: 0 Primary Learner Assessed: Patient Learning Preferences/Education Level/Primary Language Learning Preference: Explanation Highest Education Level: High School Preferred Language: English Cognitive Barrier Language Barrier: No Translator Needed: No Memory Deficit: No Emotional Barrier: No Cultural/Religious Beliefs Affecting Medical Care: No Physical Barrier Impaired Vision: No Impaired Hearing: No Decreased Hand dexterity: No Knowledge/Comprehension Knowledge Level: Medium Comprehension Level: High Ability to understand written High instructions: Ability to understand verbal High instructions: Motivation Anxiety Level: Anxious Cooperation: Cooperative Education Importance: Acknowledges Need Interest in Health Problems: Asks Questions Perception: Coherent Willingness to Engage in Self- High Management Activities: Readiness to Engage in Self- High Management Activities: Electronic Signature(s) Signed: 07/19/2019 6:43:44 PM By: Carlene Coria RN Entered By: Carlene Coria on 07/18/2019 14:27:03 -------------------------------------------------------------------------------- Fall Risk Assessment Details Patient Name: Date of Service: Amanda Butler 07/18/2019 2:45 PM Medical Record [id number] Patient Account Number: [id number] Date of Birth/Sex: Treating RN: 05-Feb-1956 (64 y.o. Female) Epps, Morey Hummingbird Primary Care Athelene Hursey: Simona Huh Other  Clinician: Referring Florentino Laabs: Treating Jakyron Fabro/Extender:Robson, Vivi Barrack, Melvern Sample in Treatment: 0 Fall Risk Assessment Items Have you had 2 or more falls in the last 12 monthso 0 No Have you had any fall that resulted in injury in the last 12 monthso 0 No FALLS RISK SCREEN History of falling - immediate or within 3 months 0 No Secondary diagnosis (Do you have 2 or more medical diagnoseso) 0 No Ambulatory aid None/bed rest/wheelchair/nurse 0 No Crutches/cane/walker 0 No Furniture 0 No Intravenous therapy Access/Saline/Heparin Lock 0 No Weak (short steps with or without shuffle, stooped but able to lift head 0 No while walking, may seek support from furniture)  Impaired (short steps with shuffle, may have difficulty arising from chair, 0 No head down, impaired balance) Mental Status Oriented to own ability 0 No Overestimates or forgets limitations 0 No Risk Level: Low Risk Score: 0 Electronic Signature(s) Signed: 07/19/2019 6:43:44 PM By: Carlene Coria RN Entered By: Carlene Coria on 07/18/2019 14:27:10 -------------------------------------------------------------------------------- Foot Assessment Details Patient Name: Date of Service: Amanda Butler 07/18/2019 2:45 PM Medical Record [id number] Patient Account Number: [id number] Date of Birth/Sex: Treating RN: [birth year] (64 y.o. Female) Carlene Coria Primary Care Severn Goddard: Simona Huh Other Clinician: Referring Burnice Oestreicher: Treating Sherrian Nunnelley/Extender:Robson, Vivi Barrack, Melvern Sample in Treatment: 0 Foot Assessment Items Site Locations + = Sensation present, - = Sensation absent, C = Callus, U = Ulcer R = Redness, W = Warmth, M = Maceration, PU = Pre-ulcerative lesion F = Fissure, S = Swelling, D = Dryness Assessment Right: Left: Other Deformity: No No Prior Foot Ulcer: No No Prior Amputation: No No Charcot Joint: No No Ambulatory Status: Ambulatory Without Help Gait: Steady Electronic  Signature(s) Signed: 07/19/2019 6:43:44 PM By: Carlene Coria RN Entered By: Carlene Coria on 07/18/2019 14:30:45 -------------------------------------------------------------------------------- Nutrition Risk Screening Details Patient Name: Date of Service: Amanda Butler, Amanda Butler 07/18/2019 2:45 PM Medical Record [id number] Patient Account Number: [id number] Date of Birth/Sex: Treating RN: 1955/11/10 (64 y.o. Female) Epps, Morey Hummingbird Primary Care Lamarion Mcevers: Simona Huh Other Clinician: Referring Betsie Peckman: Treating Nakkia Mackiewicz/Extender:Robson, Vivi Barrack, Melvern Sample in Treatment: 0 Height (in): 65 Weight (lbs): 252 Body Mass Index (BMI): 41.9 Nutrition Risk Screening Items Score Screening NUTRITION RISK SCREEN: I have an illness or condition that made me change the kind and/or 0 No amount of food I eat I eat fewer than two meals per day 0 No I eat few fruits and vegetables, or milk products 0 No I have three or more drinks of beer, liquor or wine almost every day 0 No I have tooth or mouth problems that make it hard for me to eat 0 No I don't always have enough money to buy the food I need 0 No I eat alone most of the time 0 No I take three or more different prescribed or over-the-counter drugs a day 1 Yes 0 No Without wanting to, I have lost or gained 10 pounds in the last six months I am not always physically able to shop, cook and/or feed myself 2 Yes Nutrition Protocols Good Risk Protocol Provide education on Moderate Risk Protocol 0 nutrition High Risk Proctocol Risk Level: Moderate Risk Score: 3 Electronic Signature(s) Signed: 07/19/2019 6:43:44 PM By: Carlene Coria RN Entered By: Carlene Coria on 07/18/2019 14:27:30

## 2019-07-25 ENCOUNTER — Encounter (HOSPITAL_BASED_OUTPATIENT_CLINIC_OR_DEPARTMENT_OTHER): Payer: Medicare Other | Admitting: Internal Medicine

## 2019-08-03 ENCOUNTER — Other Ambulatory Visit: Payer: Self-pay

## 2019-08-03 ENCOUNTER — Emergency Department (HOSPITAL_COMMUNITY)
Admission: EM | Admit: 2019-08-03 | Discharge: 2019-08-03 | Disposition: A | Discharge status: Home / Self Care | Payer: Medicare Other | Attending: Emergency Medicine | Admitting: Emergency Medicine

## 2019-08-03 ENCOUNTER — Emergency Department (HOSPITAL_BASED_OUTPATIENT_CLINIC_OR_DEPARTMENT_OTHER): Payer: Medicare Other

## 2019-08-03 ENCOUNTER — Emergency Department (HOSPITAL_COMMUNITY): Payer: Medicare Other

## 2019-08-03 ENCOUNTER — Encounter (HOSPITAL_COMMUNITY): Payer: Self-pay | Admitting: Emergency Medicine

## 2019-08-03 DIAGNOSIS — I110 Hypertensive heart disease with heart failure: Secondary | ICD-10-CM | POA: Diagnosis not present

## 2019-08-03 DIAGNOSIS — Z91040 Latex allergy status: Secondary | ICD-10-CM | POA: Diagnosis not present

## 2019-08-03 DIAGNOSIS — R0602 Shortness of breath: Secondary | ICD-10-CM

## 2019-08-03 DIAGNOSIS — R52 Pain, unspecified: Secondary | ICD-10-CM

## 2019-08-03 DIAGNOSIS — J45909 Unspecified asthma, uncomplicated: Secondary | ICD-10-CM | POA: Insufficient documentation

## 2019-08-03 DIAGNOSIS — I5032 Chronic diastolic (congestive) heart failure: Secondary | ICD-10-CM | POA: Diagnosis not present

## 2019-08-03 DIAGNOSIS — M7989 Other specified soft tissue disorders: Secondary | ICD-10-CM

## 2019-08-03 DIAGNOSIS — Z7984 Long term (current) use of oral hypoglycemic drugs: Secondary | ICD-10-CM | POA: Insufficient documentation

## 2019-08-03 DIAGNOSIS — Z79899 Other long term (current) drug therapy: Secondary | ICD-10-CM | POA: Insufficient documentation

## 2019-08-03 DIAGNOSIS — E119 Type 2 diabetes mellitus without complications: Secondary | ICD-10-CM | POA: Insufficient documentation

## 2019-08-03 DIAGNOSIS — R079 Chest pain, unspecified: Secondary | ICD-10-CM | POA: Diagnosis not present

## 2019-08-03 DIAGNOSIS — L0390 Cellulitis, unspecified: Secondary | ICD-10-CM | POA: Diagnosis not present

## 2019-08-03 LAB — COMPREHENSIVE METABOLIC PANEL
ALT: 23 U/L (ref 0–44)
AST: 28 U/L (ref 15–41)
Albumin: 3.8 g/dL (ref 3.5–5.0)
Alkaline Phosphatase: 86 U/L (ref 38–126)
Anion gap: 12 (ref 5–15)
BUN: 15 mg/dL (ref 8–23)
CO2: 25 mmol/L (ref 22–32)
Calcium: 9.1 mg/dL (ref 8.9–10.3)
Chloride: 97 mmol/L — ABNORMAL LOW (ref 98–111)
Creatinine, Ser: 0.73 mg/dL (ref 0.44–1.00)
GFR calc Af Amer: >60 mL/min (ref >60)
GFR calc non Af Amer: >60 mL/min (ref >60)
Glucose, Bld: 292 mg/dL — ABNORMAL HIGH (ref 70–99)
Potassium: 4.9 mmol/L (ref 3.5–5.1)
Sodium: 134 mmol/L — ABNORMAL LOW (ref 135–145)
Total Bilirubin: 2 mg/dL — ABNORMAL HIGH (ref 0.3–1.2)
Total Protein: 7.8 g/dL (ref 6.5–8.1)

## 2019-08-03 LAB — CBC
HCT: 44.5 % (ref 36.0–46.0)
Hemoglobin: 15.3 g/dL — ABNORMAL HIGH (ref 12.0–15.0)
MCH: 29.7 pg (ref 26.0–34.0)
MCHC: 34.4 g/dL (ref 30.0–36.0)
MCV: 86.4 fL (ref 80.0–100.0)
Platelets: 249 10*3/uL (ref 150–400)
RBC: 5.15 MIL/uL — ABNORMAL HIGH (ref 3.87–5.11)
RDW: 13.9 % (ref 11.5–15.5)
WBC: 9 10*3/uL (ref 4.0–10.5)
nRBC: 0 % (ref 0.0–0.2)

## 2019-08-03 LAB — BRAIN NATRIURETIC PEPTIDE: B Natriuretic Peptide: 54.2 pg/mL (ref 0.0–100.0)

## 2019-08-03 LAB — TROPONIN I (HIGH SENSITIVITY)
Troponin I (High Sensitivity): 3 ng/L (ref <18)
Troponin I (High Sensitivity): 3 ng/L (ref <18)

## 2019-08-03 MED ORDER — NITROGLYCERIN 0.4 MG SL SUBL
0.4000 mg | SUBLINGUAL_TABLET | SUBLINGUAL | Status: DC | PRN
  Filled 2019-08-03: qty 1

## 2019-08-03 MED ORDER — SODIUM CHLORIDE 0.9% FLUSH
3.0000 mL | Freq: Once | INTRAVENOUS | Status: AC
  Administered 2019-08-03: 3 mL via INTRAVENOUS

## 2019-08-03 NOTE — ED Triage Notes (Signed)
Pt c/o CHF symptoms. Since last night having chest pains, SOB, and bilat leg swelling.

## 2019-08-03 NOTE — ED Notes (Signed)
Transported to XR  

## 2019-08-03 NOTE — Progress Notes (Signed)
Lower venous duplex       has been completed. Preliminary results can be found under CV proc through chart review. Markie Frith, BS, RDMS, RVT   

## 2019-08-03 NOTE — Discharge Instructions (Signed)
Please follow-up with your primary care doctor.  Your blood sugar was very elevated today.  Also recommend discussing your blood pressure as well as your blood sugar with your primary care.  Your cardiac enzymes were normal and unchanged today.  If you have any new or concerning symptoms please return immediately to the emergency department for reevaluation.

## 2019-08-03 NOTE — ED Notes (Signed)
ED Provider at bedside. 

## 2019-08-03 NOTE — ED Provider Notes (Signed)
[facility] DEPT Provider Note   CSN: [id number] Arrival date & time: 08/03/19  [id number]     History Chief Complaint  Patient presents with  . Shortness of Breath  . Chest Pain  . leg swelling    Amanda Butler is a 64 y.o. female.  HPI  Patient is a 64 year old female with a history of asthma, CHF, DM 2, morbid obesity, HTN, HLD, questionable history of heart attack in the past.  RBBB  Patient is complaining of shortness of breath began yesterday evening and states it has been associated with sharp chest pain that is constant but waxing and waning.  She states that it is generally a 6/10 but seems to get better and worse without provocation.  She does state that it seems to radiate to both shoulders and her neck.  She denies any nausea or vomiting.  Denies any diaphoresis.  She states that the pain does seem to get worse when she walks.  She states that it occasionally hurts when she takes a deep breath however does not consistently with inhalation.  She also endorses a cough.  Patient states that she takes metoprolol and HCTZ regularly.  She has no missed doses of her GDMT for CHF per patient.  Patient states that she is no longer on her Januvia for diabetes as she was unable to afford it.  She states that her blood sugars have been elevated since that time.  She states this is a chronic issue and that she has been on Amaryl and Prandin for some time now and has had blood sugars in the 300s.  She states that she does urinate frequently but denies any dysuria, frequency, urgency, fevers, abdominal pain, back pain.  Of EMR patient has had nuclear medicine stress test 02/20/2018 which showed no reversible ischemia.  Possible remote scar/infarct in the apical inferior lateral wall.  Normal LV wall motion at that time.  EF 55% at that time.    Past Medical History:  Diagnosis Date  . Arthritis    "knees" (09/13/2015)  . Asthma   . Cellulitis   . Cellulitis  11/17/2018   LEFT LEG   . Chronic diastolic CHF (congestive heart failure) ([place])    a. 09/2015 Echo: EF 55-60%, mild LVH, gr2DD, no rwma, mild MR, mildly dil LA.   Marland Kitchen GERD (gastroesophageal reflux disease)   . High cholesterol   . Hypertension   . Midsternal chest pain    a. 09/2015 MV: no ischemia/infarct, EF 61%; b. 09/2015 Low prob V:Q scan.  . Morbid obesity ([place])   . Obesity   . Pneumonia ~ 2000   "mild case"  . RBBB (right bundle branch block) approx 2010   Dr Marisue Humble did ECG and told her  . Type II diabetes mellitus [facility])     Patient Active Problem List   Diagnosis Date Noted  . Cellulitis of left leg 11/16/2018  . Accelerated hypertension 09/19/2018  . Pressure injury of skin 02/20/2018  . Cellulitis 10/02/2017  . 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  . Closed nondisplaced fracture of greater tuberosity of left humerus 04/23/2017  . Type II diabetes mellitus ([place])   . High cholesterol   . Morbid obesity ([place])   . Chronic diastolic CHF (congestive heart failure) ([place])   . Midsternal chest pain   . Pain in the chest   . Essential hypertension   . Morbid obesity due to excess calories ([place])   .  Chest pain 09/13/2015  . Bilateral lower extremity edema 09/13/2015  . Diabetes mellitus type 2, diet-controlled ([place]) 09/13/2015  . Obesity 09/13/2015  . Total bilirubin, elevated 09/13/2015  . HTN (hypertension) 09/13/2015  . Stasis dermatitis of both legs 09/13/2015  . Left leg cellulitis 09/13/2015  . RBBB 09/13/2015  . Asthma 09/13/2015  . GERD (gastroesophageal reflux disease) 09/13/2015  . 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    Past Surgical History:  Procedure Laterality Date  . BUNIONECTOMY Left   . COLONOSCOPY WITH PROPOFOL N/A 09/23/2016   Procedure: COLONOSCOPY WITH PROPOFOL;  Surgeon: Garlan Fair, MD;  Location: [facility] ENDOSCOPY;  Service: Endoscopy;  Laterality: N/A;  . MULTIPLE TOOTH EXTRACTIONS  ~ 1975  . TENDON RELEASE Right     Leg  . TUBAL LIGATION  ~ 1983     OB History   No obstetric history on file.     Family History  Problem Relation Age of Onset  . Diabetes Mother   . Hypertension Mother   . Diabetes Father   . Hypertension Father   . Heart attack Father 80  . Diabetes Maternal Aunt   . Diabetes Maternal Grandmother   . Hypertension Maternal Grandmother   . Cancer Maternal Grandfather   . Diabetes Maternal Grandfather   . Hypertension Maternal Grandfather   . Asthma Paternal Grandmother   . Diabetes Paternal Grandmother   . Hypertension Paternal Grandmother   . Diabetes Paternal Grandfather   . Hypertension Paternal Grandfather   . Heart failure Paternal Grandfather     Social History   Tobacco Use  . Smoking status: Never Smoker  . Smokeless tobacco: Never Used  Substance Use Topics  . Alcohol use: No  . Drug use: No    Home Medications Prior to Admission medications   Medication Sig Start Date End Date Taking? Authorizing Provider  acetaminophen (TYLENOL) 325 MG tablet Take 650 mg by mouth every 6 (six) hours as needed for moderate pain.   Yes [provider]  glimepiride (AMARYL) 4 MG tablet Take 1 tablet (4 mg total) by mouth every morning. 11/19/18 11/19/19 Yes Black, Lezlie Octave, NP  lisinopril (ZESTRIL) 10 MG tablet Take 10 mg by mouth daily. 06/09/19  Yes [provider]  metoprolol succinate (TOPROL-XL) 50 MG 24 hr tablet Take 50 mg by mouth daily. Take with or immediately following a meal.   Yes [provider]  mometasone (NASONEX) 50 MCG/ACT nasal spray Place 2 sprays into the nose daily as needed (congestion).    Yes [provider]  repaglinide (PRANDIN) 1 MG tablet Take 1 mg by mouth See admin instructions. Take 1 tablet 15 to 30 mins before meals once a day for diabetes 08/19/18  Yes [provider]  rosuvastatin (CRESTOR) 20 MG tablet Take 20 mg by mouth daily.   Yes [provider]  Skin Protectants, Misc. (EUCERIN)  cream Apply 1 application topically as needed for dry skin.   Yes [provider]  triamterene-hydrochlorothiazide (MAXZIDE) 75-50 MG per tablet Take 1 tablet by mouth daily.   Yes [provider]  doxycycline (VIBRA-TABS) 100 MG tablet Take 1 tablet (100 mg total) by mouth 2 (two) times daily. Patient not taking: Reported on 11/16/2018 11/14/18   Robyn Haber, MD  lisinopril (ZESTRIL) 20 MG tablet Take 1 tablet (20 mg total) by mouth daily. Patient not taking: Reported on 08/03/2019 04/29/19   Croitoru, Dani Gobble, MD    Allergies    Bee venom, Iodine, Shellfish allergy, Actos [pioglitazone],  Aspirin, Latex, Metformin and related, Morphine and related, Nsaids, and Ciprofloxacin  Review of Systems   Review of Systems  Constitutional: Negative for chills and fever.  HENT: Negative for congestion.   Eyes: Negative for pain.  Respiratory: Positive for shortness of breath. Negative for cough.   Cardiovascular: Positive for chest pain and leg swelling (Left leg ongoing for years).  Gastrointestinal: Negative for abdominal pain and vomiting.  Genitourinary: Positive for frequency (Ongoing for years). Negative for dysuria.  Musculoskeletal: Negative for myalgias.  Skin: Negative for rash.  Neurological: Positive for light-headedness (Ongoing for years). Negative for dizziness and headaches.    Physical Exam Updated Vital Signs BP (!) 146/52   Pulse 78   Temp 98.7 F (37.1 C) (Oral)   Resp (!) 21   SpO2 95%   Physical Exam Vitals and nursing note reviewed.  Constitutional:      General: She is not in acute distress.    Appearance: She is obese.     Comments: Patient is 64 year old female in no acute distress.  Pleasant, able to answer questions appropriately follow commands.  HENT:     Head: Normocephalic and atraumatic.     Nose: Nose normal.     Mouth/Throat:     Mouth: Mucous membranes are moist.  Eyes:     General: No scleral icterus. Cardiovascular:     Rate  and Rhythm: Normal rate and regular rhythm.     Pulses: Normal pulses.     Heart sounds: Normal heart sounds.  Pulmonary:     Effort: Pulmonary effort is normal. No respiratory distress.     Breath sounds: No wheezing.     Comments: Lungs are clear to auscultation bilaterally.  No crackles.  Patient speaking full sentences without difficulty SPO2 99% on room air. Abdominal:     Palpations: Abdomen is soft.     Tenderness: There is no abdominal tenderness. There is no right CVA tenderness, left CVA tenderness, guarding or rebound.     Comments: Obese abdomen.  No tenderness or guarding.  Musculoskeletal:     Cervical back: Normal range of motion.     Right lower leg: Edema present.     Left lower leg: Edema present.     Comments: Bilateral lower extremity edema.  Right leg with 2+ edema to the midshin.  Left leg with significant swelling, edema, discoloration and venous stasis dermatitis.  See attached image.  Skin:    General: Skin is warm and dry.     Capillary Refill: Capillary refill takes less than 2 seconds.  Neurological:     Mental Status: She is alert. Mental status is at baseline.     Comments: Neuro exam notable for no focal neuro deficits, cranial nerves within normal limits, no sensory deficits, no weakness, good cerebellar function with upper and lower extremities.  Psychiatric:        Mood and Affect: Mood normal.        Behavior: Behavior normal.         ED Results / Procedures / Treatments   Labs (all labs ordered are listed, but only abnormal results are displayed) Labs Reviewed  CBC - Abnormal; Notable for the following components:      Result Value   RBC 5.15 (*)    Hemoglobin 15.3 (*)    All other components within normal limits  COMPREHENSIVE METABOLIC PANEL - Abnormal; Notable for the following components:   Sodium 134 (*)    Chloride 97 (*)  Glucose, Bld 292 (*)    Total Bilirubin 2.0 (*)    All other components within normal limits  BRAIN  NATRIURETIC PEPTIDE  TROPONIN I (HIGH SENSITIVITY)  TROPONIN I (HIGH SENSITIVITY)    EKG None  Radiology DG Chest 2 View  Result Date: 08/03/2019 CLINICAL DATA:  Chest pain and shortness of breath. Lower extremity edema. EXAM: CHEST - 2 VIEW COMPARISON:  04/27/2019 FINDINGS: The heart size and mediastinal contours are within normal limits. There is no evidence of pulmonary edema, consolidation, pneumothorax, nodule or pleural fluid. The visualized skeletal structures are unremarkable. IMPRESSION: No active cardiopulmonary disease. Electronically Signed   By: Aletta Edouard M.D.   On: 08/03/2019 10:47   VAS [place] LOWER EXTREMITY VENOUS (DVT) ([facility] 7a-7p)  Result Date: 08/03/2019  Lower Venous DVTStudy Indications: Swelling, Pain, and cellulitis.  Comparison Study: 09/20/18 negative Performing Technologist: June Leap RDMS, RVT  Examination Guidelines: A complete evaluation includes B-mode imaging, spectral Doppler, color Doppler, and power Doppler as needed of all accessible portions of each vessel. Bilateral testing is considered an integral part of a complete examination. Limited examinations for reoccurring indications may be performed as noted. The reflux portion of the exam is performed with the patient in reverse Trendelenburg.  +---------+---------------+---------+-----------+----------+-------------------+ RIGHT    CompressibilityPhasicitySpontaneityPropertiesThrombus Aging      +---------+---------------+---------+-----------+----------+-------------------+ CFV      Full           Yes      Yes                                      +---------+---------------+---------+-----------+----------+-------------------+ SFJ      Full                                                             +---------+---------------+---------+-----------+----------+-------------------+ FV Prox  Full                                                              +---------+---------------+---------+-----------+----------+-------------------+ FV Mid   Full                                                             +---------+---------------+---------+-----------+----------+-------------------+ FV DistalFull                                                             +---------+---------------+---------+-----------+----------+-------------------+ PFV      Full                                                             +---------+---------------+---------+-----------+----------+-------------------+  POP                     Yes      Yes                  not well visualized +---------+---------------+---------+-----------+----------+-------------------+ PTV      Full                                                             +---------+---------------+---------+-----------+----------+-------------------+ PERO     Full                                                             +---------+---------------+---------+-----------+----------+-------------------+   +---------+---------------+---------+-----------+----------+-------------------+ LEFT     CompressibilityPhasicitySpontaneityPropertiesThrombus Aging      +---------+---------------+---------+-----------+----------+-------------------+ CFV      Full           Yes      Yes                                      +---------+---------------+---------+-----------+----------+-------------------+ SFJ      Full                                                             +---------+---------------+---------+-----------+----------+-------------------+ FV Prox  Full                                                             +---------+---------------+---------+-----------+----------+-------------------+ FV Mid   Full                                                             +---------+---------------+---------+-----------+----------+-------------------+ FV  DistalFull                                                             +---------+---------------+---------+-----------+----------+-------------------+ PFV      Full                                                             +---------+---------------+---------+-----------+----------+-------------------+ POP  Yes      Yes                  not well visualized +---------+---------------+---------+-----------+----------+-------------------+ PTV                                                   Not visualized      +---------+---------------+---------+-----------+----------+-------------------+ PERO                                                  Not visualized      +---------+---------------+---------+-----------+----------+-------------------+     Summary: RIGHT: - There is no evidence of deep vein thrombosis in the lower extremity.  - No cystic structure found in the popliteal fossa.  LEFT: - There is no evidence of deep vein thrombosis in the lower extremity. However, portions of this examination were limited- see technologist comments above.  - No cystic structure found in the popliteal fossa.  *See table(s) above for measurements and observations.    Preliminary     Procedures Procedures (including critical care time)  Medications Ordered in ED Medications  nitroGLYCERIN (NITROSTAT) SL tablet 0.4 mg (has no administration in time range)  sodium chloride flush (NS) 0.9 % injection 3 mL (3 mLs Intravenous Given 08/03/19 1028)    ED Course  I have reviewed the triage vital signs and the nursing notes.  Pertinent labs & imaging results that were available during my care of the patient were reviewed by me and considered in my medical decision making (see chart for details).  Patient is 64 year old female with past medical history detailed above.  She is presented today with shortness of breath.  She endorses multiple other complaints such as urinary  frequency, thick toenails, chronic lightheadedness ongoing for multiple years, diabetes which she states that has been difficult to control for PCP.  She also endorses chest pain which is atypical description which is sharp and constant has been ongoing since last night has no aggravating mitigating factors although she states that it seems to be worse when she is thinking about it or ambulating.  She is worse with movement.  On physical exam patient has notable left lower extremity edema and discoloration of the skin.  This is a chronic problem for her.  She states that there is no changes lower extremity as she has had ultrasounds frequently in the past.  She does however endorse right lower extremity swelling.  She has a history of CHF however on my review of EMR it appears that her echocardiograms in the past have shown preserved ejection fraction of 55%.  Her most recent stress test which was done approximately 1 year ago showed no reversible ischemia.  There was area of small scarring.  Given patient's considerable past medical history and concerning complaint today we will obtain chest pain rule out work-up in ED plan to discharge if negative.  Patient is agreeable to plan.  Clinical Course as of Aug 02 1413  Wed Aug 03, 2019  1034 EKG sinus rate of 90 right bundle branch block nonspecific T waves similar to prior EKG 1/21    [MB]  7029 64 year old lady with history of hypertension CHF here with increased shortness of  breath and some vague chest pain.  Chronic leg swelling may be worse on the right.  Initial work-up fairly unremarkable with normal troponin and normal BNP.  Chest x-ray also unremarkable.  Getting duplex lower extremities.   [MB]  1219 Bilateral DVT study negative for DVT.  There was some limitations for the study given body habitus.  VAS [place] LOWER EXTREMITY VENOUS (DVT) ([facility] 7a-7p) [WF]  1219 Chest x-ray without any acute abnormality.  Personally reviewed the x-ray as well  as no evidence of pulmonary vascular congestion or pulmonary edema.  This correlates well with my exam which found no abnormalities.  DG Chest 2 View [WF]  1220 CMP with elevated blood sugar 292; patient has no abdominal pain, no anion gap, is mentating well.  Does not appear to be in [facility] or DKA.  No other notable electrolyte abnormalities.  Elevated at 2 however my review of EMR it appears that she has had elevated bilirubins in the past.  She has no abdominal pain.  Doubt abdominal pathology causing this today.  Comprehensive metabolic panel(!) [WF]  [id number] CBC with no acute abnormalities.  Patient is hematocrit is chronically elevated.  Is mildly elevated today somewhat higher than usual likely secondary to dehydration however unlikely to be related to sx today.   CBC(!) [WF]  1256 I personally reviewed EKG with my attending Dr. Melina Copa.  NSR, RBBB present for years.  No evidence of acute ischemia.   [WF]    Clinical Course User Index [MB] Hayden Rasmussen, MD [WF] Tedd Sias, [place]   I discussed this case with my attending physician who cosigned this note including patient's presenting symptoms, physical exam, and planned diagnostics and interventions. Attending physician stated agreement with plan or made changes to plan which were implemented.   Attending physician assessed patient at bedside.   I discussed with patient the plan to discharge home.  She is agreeable with this.  She fully understands the conditions that she is at risk for including ACS.  At time of discharge she states that she is peeing more frequently however she denies any dysuria, urgency, hematuria or abdominal pain, fevers or chills.  Doubt any urinary pathology as culprit in her symptoms today.  She states that she has been peeing frequently for many years.  She states she is on hydrochlorothiazide.  The medical records were personally reviewed by myself. I personally reviewed all lab results and interpreted all  imaging studies and either concurred with their official read or contacted radiology for clarification. Additional history obtained from old records  This patient appears reasonably screened and I doubt any other medical condition requiring further workup, evaluation, or treatment in the ED at this time prior to discharge.   Patient's vitals are WNL apart from vital sign abnormalities discussed above, patient is in NAD, and able to ambulate in the ED at their baseline and able to tolerate PO.  Pain has been managed or a plan has been made for home management and has no complaints prior to discharge. Patient is comfortable with above plan and for discharge at this time. All questions were answered prior to disposition. Results from the ER workup discussed with the patient face to face and all questions answered to the best of my ability. The patient is safe for discharge with strict return precautions. Patient appears safe for discharge with appropriate follow-up. Conveyed my impression with the patient and they voiced understanding and are agreeable to plan.   An  After Visit Summary was printed and given to the patient.  Portions of this note were generated with [employer]. Dictation errors may occur despite best attempts at proofreading.    Patient well-appearing at discharge.  Ambulated without difficulty or dyspnea.   She will return to ED for any new or concerning symptoms.   MDM Rules/Calculators/A&P                      Final Clinical Impression(s) / ED Diagnoses Final diagnoses:  Chest pain, unspecified type  Shortness of breath    Rx / DC Orders ED Discharge Orders    None       Tedd Sias, [place] 08/03/19 1415    Hayden Rasmussen, MD 08/03/19 1759

## 2019-08-05 ENCOUNTER — Ambulatory Visit: Payer: Medicare Other | Admitting: Internal Medicine

## 2020-03-06 ENCOUNTER — Other Ambulatory Visit: Payer: Self-pay | Admitting: Family Medicine

## 2020-03-06 DIAGNOSIS — Z1231 Encounter for screening mammogram for malignant neoplasm of breast: Secondary | ICD-10-CM

## 2020-04-20 ENCOUNTER — Ambulatory Visit: Payer: Medicare Other

## 2020-05-01 ENCOUNTER — Telehealth: Payer: Self-pay | Admitting: Cardiovascular Disease

## 2020-05-01 NOTE — Telephone Encounter (Signed)
Called to discuss scheduling follow up appointment with Dr. Baldo Ash mail full and could not leave message

## 2020-05-30 ENCOUNTER — Inpatient Hospital Stay: Admission: RE | Admit: 2020-05-30 | Payer: Medicare Other | Source: Ambulatory Visit

## 2020-06-13 ENCOUNTER — Encounter (HOSPITAL_COMMUNITY): Payer: Self-pay | Admitting: Emergency Medicine

## 2020-06-13 ENCOUNTER — Emergency Department (HOSPITAL_COMMUNITY)
Admission: EM | Admit: 2020-06-13 | Discharge: 2020-06-14 | Disposition: A | Discharge status: Home / Self Care | Payer: Medicare Other | Attending: Emergency Medicine | Admitting: Emergency Medicine

## 2020-06-13 ENCOUNTER — Emergency Department (HOSPITAL_COMMUNITY): Payer: Medicare Other

## 2020-06-13 ENCOUNTER — Other Ambulatory Visit: Payer: Self-pay

## 2020-06-13 DIAGNOSIS — I10 Essential (primary) hypertension: Secondary | ICD-10-CM | POA: Diagnosis not present

## 2020-06-13 DIAGNOSIS — Z7984 Long term (current) use of oral hypoglycemic drugs: Secondary | ICD-10-CM | POA: Diagnosis not present

## 2020-06-13 DIAGNOSIS — J45909 Unspecified asthma, uncomplicated: Secondary | ICD-10-CM | POA: Insufficient documentation

## 2020-06-13 DIAGNOSIS — R079 Chest pain, unspecified: Secondary | ICD-10-CM | POA: Diagnosis not present

## 2020-06-13 DIAGNOSIS — E1165 Type 2 diabetes mellitus with hyperglycemia: Secondary | ICD-10-CM | POA: Diagnosis not present

## 2020-06-13 DIAGNOSIS — N3000 Acute cystitis without hematuria: Secondary | ICD-10-CM

## 2020-06-13 DIAGNOSIS — E119 Type 2 diabetes mellitus without complications: Secondary | ICD-10-CM | POA: Diagnosis not present

## 2020-06-13 DIAGNOSIS — L03116 Cellulitis of left lower limb: Secondary | ICD-10-CM | POA: Diagnosis not present

## 2020-06-13 DIAGNOSIS — I5032 Chronic diastolic (congestive) heart failure: Secondary | ICD-10-CM | POA: Insufficient documentation

## 2020-06-13 DIAGNOSIS — R0789 Other chest pain: Secondary | ICD-10-CM | POA: Insufficient documentation

## 2020-06-13 DIAGNOSIS — I110 Hypertensive heart disease with heart failure: Secondary | ICD-10-CM | POA: Diagnosis not present

## 2020-06-13 DIAGNOSIS — R6889 Other general symptoms and signs: Secondary | ICD-10-CM | POA: Diagnosis not present

## 2020-06-13 DIAGNOSIS — I890 Lymphedema, not elsewhere classified: Secondary | ICD-10-CM | POA: Insufficient documentation

## 2020-06-13 DIAGNOSIS — L97919 Non-pressure chronic ulcer of unspecified part of right lower leg with unspecified severity: Secondary | ICD-10-CM | POA: Diagnosis not present

## 2020-06-13 DIAGNOSIS — Z91040 Latex allergy status: Secondary | ICD-10-CM | POA: Insufficient documentation

## 2020-06-13 DIAGNOSIS — Z79899 Other long term (current) drug therapy: Secondary | ICD-10-CM | POA: Diagnosis not present

## 2020-06-13 DIAGNOSIS — Z743 Need for continuous supervision: Secondary | ICD-10-CM | POA: Diagnosis not present

## 2020-06-13 LAB — BASIC METABOLIC PANEL
Anion gap: 14 (ref 5–15)
BUN: 20 mg/dL (ref 8–23)
CO2: 23 mmol/L (ref 22–32)
Calcium: 9.3 mg/dL (ref 8.9–10.3)
Chloride: 99 mmol/L (ref 98–111)
Creatinine, Ser: 1.01 mg/dL — ABNORMAL HIGH (ref 0.44–1.00)
GFR, Estimated: >60 mL/min (ref >60)
Glucose, Bld: 315 mg/dL — ABNORMAL HIGH (ref 70–99)
Potassium: 4 mmol/L (ref 3.5–5.1)
Sodium: 136 mmol/L (ref 135–145)

## 2020-06-13 LAB — CBC
HCT: 43.6 % (ref 36.0–46.0)
Hemoglobin: 15.1 g/dL — ABNORMAL HIGH (ref 12.0–15.0)
MCH: 29.5 pg (ref 26.0–34.0)
MCHC: 34.6 g/dL (ref 30.0–36.0)
MCV: 85.3 fL (ref 80.0–100.0)
Platelets: 279 10*3/uL (ref 150–400)
RBC: 5.11 MIL/uL (ref 3.87–5.11)
RDW: 13.1 % (ref 11.5–15.5)
WBC: 10.1 10*3/uL (ref 4.0–10.5)
nRBC: 0 % (ref 0.0–0.2)

## 2020-06-13 LAB — TROPONIN I (HIGH SENSITIVITY)
Troponin I (High Sensitivity): 8 ng/L (ref <18)
Troponin I (High Sensitivity): 10 ng/L (ref <18)

## 2020-06-13 LAB — BRAIN NATRIURETIC PEPTIDE: B Natriuretic Peptide: 47.5 pg/mL (ref 0.0–100.0)

## 2020-06-13 MED ORDER — CLINDAMYCIN PHOSPHATE 600 MG/50ML IV SOLN: 600.0000 mg | Freq: Once | INTRAVENOUS | Status: DC

## 2020-06-13 MED ORDER — FUROSEMIDE 10 MG/ML IJ SOLN
40.0000 mg | Freq: Once | INTRAMUSCULAR | Status: AC
  Administered 2020-06-13: 40 mg via INTRAVENOUS
  Filled 2020-06-13: qty 4

## 2020-06-13 MED ORDER — LISINOPRIL 20 MG PO TABS
20.0000 mg | ORAL_TABLET | Freq: Once | ORAL | Status: AC
  Administered 2020-06-13: 20 mg via ORAL
  Filled 2020-06-13: qty 1

## 2020-06-13 MED ORDER — HYDRALAZINE HCL 20 MG/ML IJ SOLN
10.0000 mg | Freq: Once | INTRAMUSCULAR | Status: AC
  Administered 2020-06-13: 10 mg via INTRAVENOUS
  Filled 2020-06-13: qty 1

## 2020-06-13 MED ORDER — DOXYCYCLINE HYCLATE 100 MG PO TABS
100.0000 mg | ORAL_TABLET | Freq: Once | ORAL | Status: AC
  Administered 2020-06-13: 100 mg via ORAL
  Filled 2020-06-13: qty 1

## 2020-06-13 NOTE — ED Triage Notes (Signed)
Pt BIB [facility], c/o non-radiating chest pain that started today. Denies shortness of breath. Also c/o BLE swelling.

## 2020-06-13 NOTE — ED Notes (Signed)
Pt has an open wound to bilateral lower extremities.Serousanguineous drainage present.

## 2020-06-13 NOTE — ED Provider Notes (Signed)
[facility] EMERGENCY DEPARTMENT Provider Note   CSN: [id number] Arrival date & time: 06/13/20  1528     History Chief Complaint  Patient presents with  . Chest Pain    Amanda Butler is a 65 y.o. female history of diastolic CHF, lymphedema, diabetes, previous MI, here presenting with chest pain.  Patient states around 3 pm, she was outside on the porch and had sudden onset of chest pressure.  She states that was 8 out of 10 at that time.  Patient states that after she got here it improved.  Patient states that for the last several weeks her legs have becoming more swollen.  Patient has history of lymphedema and leg cellulitis and she was concerned that she may have a cellulitis now.  Denies any fevers.  She states that she has some wounds on her legs are draining.  Patient was noted to be hypertensive but is compliant with her blood pressure medicines.  The history is provided by the patient.       Past Medical History:  Diagnosis Date  . Arthritis    "knees" (09/13/2015)  . Asthma   . Cellulitis   . Cellulitis 11/17/2018   LEFT LEG   . Chronic diastolic CHF (congestive heart failure) ([place])    a. 09/2015 Echo: EF 55-60%, mild LVH, gr2DD, no rwma, mild MR, mildly dil LA.   Marland Kitchen GERD (gastroesophageal reflux disease)   . High cholesterol   . Hypertension   . Midsternal chest pain    a. 09/2015 MV: no ischemia/infarct, EF 61%; b. 09/2015 Low prob V:Q scan.  . Morbid obesity ([place])   . Obesity   . Pneumonia ~ 2000   "mild case"  . RBBB (right bundle branch block) approx 2010   Dr Marisue Humble did ECG and told her  . Type II diabetes mellitus [facility])     Patient Active Problem List   Diagnosis Date Noted  . Cellulitis of left leg 11/16/2018  . Accelerated hypertension 09/19/2018  . Pressure injury of skin 02/20/2018  . Cellulitis 10/02/2017  . 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  . Closed nondisplaced fracture of greater tuberosity  of left humerus 04/23/2017  . Type II diabetes mellitus ([place])   . High cholesterol   . Morbid obesity ([place])   . Chronic diastolic CHF (congestive heart failure) ([place])   . Midsternal chest pain   . Pain in the chest   . Essential hypertension   . Morbid obesity due to excess calories ([place])   . Chest pain 09/13/2015  . Bilateral lower extremity edema 09/13/2015  . Diabetes mellitus type 2, diet-controlled ([place]) 09/13/2015  . Obesity 09/13/2015  . Total bilirubin, elevated 09/13/2015  . HTN (hypertension) 09/13/2015  . Stasis dermatitis of both legs 09/13/2015  . Left leg cellulitis 09/13/2015  . RBBB 09/13/2015  . Asthma 09/13/2015  . GERD (gastroesophageal reflux disease) 09/13/2015  . 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    Past Surgical History:  Procedure Laterality Date  . BUNIONECTOMY Left   . COLONOSCOPY WITH PROPOFOL N/A 09/23/2016   Procedure: COLONOSCOPY WITH PROPOFOL;  Surgeon: Garlan Fair, MD;  Location: [facility] ENDOSCOPY;  Service: Endoscopy;  Laterality: N/A;  . MULTIPLE TOOTH EXTRACTIONS  ~ 1975  . TENDON RELEASE Right    Leg  . TUBAL LIGATION  ~ 1983     OB History   No obstetric history on file.     Family History  Problem Relation Age of  Onset  . Diabetes Mother   . Hypertension Mother   . Diabetes Father   . Hypertension Father   . Heart attack Father 45  . Diabetes Maternal Aunt   . Diabetes Maternal Grandmother   . Hypertension Maternal Grandmother   . Cancer Maternal Grandfather   . Diabetes Maternal Grandfather   . Hypertension Maternal Grandfather   . Asthma Paternal Grandmother   . Diabetes Paternal Grandmother   . Hypertension Paternal Grandmother   . Diabetes Paternal Grandfather   . Hypertension Paternal Grandfather   . Heart failure Paternal Grandfather     Social History   Tobacco Use  . Smoking status: Never Smoker  . Smokeless tobacco: Never Used  Vaping Use  . Vaping Use: Never used  Substance Use Topics  . Alcohol use: No   . Drug use: No    Home Medications Prior to Admission medications   Medication Sig Start Date End Date Taking? Authorizing Provider  acetaminophen (TYLENOL) 325 MG tablet Take 650 mg by mouth every 6 (six) hours as needed for moderate pain.    [provider]  doxycycline (VIBRA-TABS) 100 MG tablet Take 1 tablet (100 mg total) by mouth 2 (two) times daily. Patient not taking: Reported on 11/16/2018 11/14/18   Robyn Haber, MD  glimepiride (AMARYL) 4 MG tablet Take 1 tablet (4 mg total) by mouth every morning. 11/19/18 11/19/19  Radene Gunning, NP  lisinopril (ZESTRIL) 10 MG tablet Take 10 mg by mouth daily. 06/09/19   [provider]  lisinopril (ZESTRIL) 20 MG tablet Take 1 tablet (20 mg total) by mouth daily. Patient not taking: Reported on 08/03/2019 04/29/19   Croitoru, Dani Gobble, MD  metoprolol succinate (TOPROL-XL) 50 MG 24 hr tablet Take 50 mg by mouth daily. Take with or immediately following a meal.    [provider]  mometasone (NASONEX) 50 MCG/ACT nasal spray Place 2 sprays into the nose daily as needed (congestion).     [provider]  repaglinide (PRANDIN) 1 MG tablet Take 1 mg by mouth See admin instructions. Take 1 tablet 15 to 30 mins before meals once a day for diabetes 08/19/18   [provider]  rosuvastatin (CRESTOR) 20 MG tablet Take 20 mg by mouth daily.    [provider]  Skin Protectants, Misc. (EUCERIN) cream Apply 1 application topically as needed for dry skin.    [provider]  triamterene-hydrochlorothiazide (MAXZIDE) 75-50 MG per tablet Take 1 tablet by mouth daily.    [provider]    Allergies    Bee venom, Iodine, Shellfish allergy, Actos [pioglitazone], Aspirin, Latex, Metformin and related, Morphine and related, Nsaids, and Ciprofloxacin  Review of Systems   Review of Systems  Cardiovascular: Positive for chest pain and leg swelling.  All other systems reviewed and are  negative.   Physical Exam Updated Vital Signs BP (!) 175/77   Pulse 67   Temp 98.3 F (36.8 C) (Oral)   Resp 17   SpO2 100%   Physical Exam Vitals and nursing note reviewed.  Constitutional:      Comments: Chronically ill  HENT:     Head: Normocephalic.  Eyes:     Extraocular Movements: Extraocular movements intact.     Pupils: Pupils are equal, round, and reactive to light.  Cardiovascular:     Rate and Rhythm: Normal rate and regular rhythm.     Heart sounds: Normal heart sounds.  Pulmonary:     Effort: Pulmonary effort is normal.  Breath sounds: Normal breath sounds.  Abdominal:     General: Bowel sounds are normal.     Palpations: Abdomen is soft.  Musculoskeletal:     Cervical back: Normal range of motion and neck supple.     Comments: Patient has bilateral lymphedema.  The left leg is 2+ edema with some venous stasis changes.  Patient also has dry skin and a small draining ulcer in the back of the left calf.  Patient is able to wiggle toes and is 2+ pulses.  Right leg with 1+ edema.  Skin:    Capillary Refill: Capillary refill takes less than 2 seconds.  Neurological:     General: No focal deficit present.     Mental Status: She is alert and oriented to person, place, and time.  Psychiatric:        Mood and Affect: Mood normal.        Behavior: Behavior normal.     ED Results / Procedures / Treatments   Labs (all labs ordered are listed, but only abnormal results are displayed) Labs Reviewed  BASIC METABOLIC PANEL - Abnormal; Notable for the following components:      Result Value   Glucose, Bld 315 (*)    Creatinine, Ser 1.01 (*)    All other components within normal limits  CBC - Abnormal; Notable for the following components:   Hemoglobin 15.1 (*)    All other components within normal limits  BRAIN NATRIURETIC PEPTIDE  TROPONIN I (HIGH SENSITIVITY)  TROPONIN I (HIGH SENSITIVITY)    EKG EKG Interpretation  Date/Time:  Wednesday June 13 2020  15:30:08 EST Ventricular Rate:  89 PR Interval:  168 QRS Duration: 126 QT Interval:  394 QTC Calculation: 479 R Axis:   36 Text Interpretation: Normal sinus rhythm Right bundle branch block Abnormal ECG No significant change since last tracing Confirmed by Wandra Arthurs [phone]) on 06/13/2020 9:23:53 PM   Radiology DG Chest 2 View  Result Date: 06/13/2020 CLINICAL DATA:  Chest pain, low body temperature, leg wound X 2-3 months. EXAM: CHEST - 2 VIEW COMPARISON:  Chest x-ray 08/03/2019. FINDINGS: The heart size and mediastinal contours are within normal limits. No focal consolidation. No pulmonary edema. No pleural effusion. No pneumothorax. No acute osseous abnormality. Multilevel degenerative changes of the spine. IMPRESSION: No active cardiopulmonary disease. Electronically Signed   By: Iven Finn M.D.   On: 06/13/2020 16:17    Procedures Procedures   Medications Ordered in ED Medications  hydrALAZINE (APRESOLINE) injection 10 mg (has no administration in time range)  furosemide (LASIX) injection 40 mg (40 mg Intravenous Given 06/13/20 2222)  lisinopril (ZESTRIL) tablet 20 mg (20 mg Oral Given 06/13/20 2221)  doxycycline (VIBRA-TABS) tablet 100 mg (100 mg Oral Given 06/13/20 2221)    ED Course  I have reviewed the triage vital signs and the nursing notes.  Pertinent labs & imaging results that were available during my care of the patient were reviewed by me and considered in my medical decision making (see chart for details).    MDM Rules/Calculators/A&P                         [place] JON is a 65 y.o. female here presenting with chest pain and hypertension and leg swelling.  She has chronic leg edema and I wonder if she has venous stasis changes versus early cellulitis.  Patient is also very hypertensive in the ED.  Considered dissection versus symptomatic  hypertension versus ACS.  Plan to get troponin x2, CBC, bmp, CTA dissection study.  We will also get BNP and diurese patient as  well.   11:23 PM Patient apparently has a contrast allergy dye.  Her chest pain went away.  She refused a CTA dissection study.  Patient has 2 negative troponins.  Her BNP is normal.  She was given Lasix and blood pressure medicine her blood pressure went down to 170s.  I was about to discharge her home but she states that she has burning with urination and would like a urinalysis checked.   11:37 PM BP went up to over 200.  Ordered some hydralazine.  Urinalysis pending.  Patient states that she has no way to get home.  Signed out to Dr. Gilford Raid.  If her blood pressure remains elevated anticipate admission for hypertensive urgency. Otherwise patient can be discharged home with Lasix 40 mg daily for 3 days and doxycycline twice daily for a week.  Patient has a wound care doctor that she can follow-up with.    Final Clinical Impression(s) / ED Diagnoses Final diagnoses:  None    Rx / DC Orders ED Discharge Orders    None       Drenda Freeze, MD 06/13/20 [phone]

## 2020-06-14 ENCOUNTER — Other Ambulatory Visit: Payer: Self-pay | Admitting: Emergency Medicine

## 2020-06-14 DIAGNOSIS — Z7401 Bed confinement status: Secondary | ICD-10-CM | POA: Diagnosis not present

## 2020-06-14 DIAGNOSIS — Z743 Need for continuous supervision: Secondary | ICD-10-CM | POA: Diagnosis not present

## 2020-06-14 DIAGNOSIS — M2550 Pain in unspecified joint: Secondary | ICD-10-CM | POA: Diagnosis not present

## 2020-06-14 DIAGNOSIS — R5381 Other malaise: Secondary | ICD-10-CM | POA: Diagnosis not present

## 2020-06-14 LAB — URINALYSIS, ROUTINE W REFLEX MICROSCOPIC
Bilirubin Urine: NEGATIVE
Glucose, UA: NEGATIVE mg/dL
Ketones, ur: NEGATIVE mg/dL
Nitrite: POSITIVE — AB
Protein, ur: NEGATIVE mg/dL
Specific Gravity, Urine: 1.005 (ref 1.005–1.030)
WBC, UA: >50 WBC/hpf — ABNORMAL HIGH (ref 0–5)
pH: 7 (ref 5.0–8.0)

## 2020-06-14 LAB — CBG MONITORING, ED: Glucose-Capillary: 188 mg/dL — ABNORMAL HIGH (ref 70–99)

## 2020-06-14 MED ORDER — LISINOPRIL 10 MG PO TABS
10.0000 mg | ORAL_TABLET | Freq: Every day | ORAL | Status: DC
  Administered 2020-06-14: 10 mg via ORAL
  Filled 2020-06-14: qty 1

## 2020-06-14 MED ORDER — SODIUM CHLORIDE 0.9 % IV SOLN
1.0000 g | Freq: Once | INTRAVENOUS | Status: AC
  Administered 2020-06-14: 1 g via INTRAVENOUS
  Filled 2020-06-14: qty 10

## 2020-06-14 MED ORDER — GLIMEPIRIDE 4 MG PO TABS
4.0000 mg | ORAL_TABLET | Freq: Every day | ORAL | Status: DC
  Administered 2020-06-14: 4 mg via ORAL
  Filled 2020-06-14: qty 1

## 2020-06-14 MED ORDER — SULFAMETHOXAZOLE-TRIMETHOPRIM 800-160 MG PO TABS
1.0000 | ORAL_TABLET | Freq: Two times a day (BID) | ORAL | Status: DC
  Administered 2020-06-14: 1 via ORAL
  Filled 2020-06-14: qty 1

## 2020-06-14 MED ORDER — METOPROLOL SUCCINATE ER 25 MG PO TB24
50.0000 mg | ORAL_TABLET | Freq: Every day | ORAL | Status: DC
  Administered 2020-06-14: 50 mg via ORAL
  Filled 2020-06-14: qty 2

## 2020-06-14 MED ORDER — TRIAMTERENE-HCTZ 75-50 MG PO TABS
1.0000 | ORAL_TABLET | Freq: Every day | ORAL | Status: DC
  Administered 2020-06-14: 1 via ORAL
  Filled 2020-06-14: qty 1

## 2020-06-14 MED ORDER — ACETAMINOPHEN 325 MG PO TABS: 650.0000 mg | ORAL_TABLET | Freq: Four times a day (QID) | ORAL | Status: DC | PRN

## 2020-06-14 MED ORDER — ROSUVASTATIN CALCIUM 20 MG PO TABS
20.0000 mg | ORAL_TABLET | Freq: Every day | ORAL | Status: DC
  Administered 2020-06-14: 20 mg via ORAL
  Filled 2020-06-14: qty 4

## 2020-06-14 MED ORDER — AMOXICILLIN-POT CLAVULANATE 875-125 MG PO TABS: 1.0000 | ORAL_TABLET | Freq: Two times a day (BID) | ORAL | 0 refills | Status: DC

## 2020-06-14 MED ORDER — FLUTICASONE PROPIONATE 50 MCG/ACT NA SUSP: 1.0000 | Freq: Every day | NASAL | Status: DC

## 2020-06-14 MED ORDER — REPAGLINIDE 1 MG PO TABS
1.0000 mg | ORAL_TABLET | Freq: Every day | ORAL | Status: DC
  Administered 2020-06-14: 1 mg via ORAL
  Filled 2020-06-14: qty 1

## 2020-06-14 MED FILL — AMOX-CLAV 875-125 MG TABLET: 875-125 | 7 days supply | Qty: 14 | Fill #0

## 2020-06-14 NOTE — ED Provider Notes (Signed)
10:25 AM-patient is boarding pending social work evaluation.  She has been treated for UTI and cellulitis with IV and oral antibiotics and her usual home medicines have been started.  She has been seen by social work who feel that she is a candidate for discharge.  They do not feel that she needs to be placed.  Patient has indicated that she can get her water turned back on and everything she needs at home.   1:35 PM-social work has delivered her Augmentin prescription. Patient to go home by transport of choice, she does not require medical transport.  Outpatient plan-take Augmentin for UTI and lower extremity infection. Follow-up with wound care clinic or lymphedema clinic regarding swelling of lower legs. Follow-up with PCP for recheck of urine, 1 week.    Daleen Bo, MD 06/14/20 [phone]

## 2020-06-14 NOTE — Progress Notes (Signed)
CSW and RN spoke with patient. Patient had an open wound on her legs. Patient stated she did not need any assistance with her water bill and has the money to pay for it. Patient stated her uncle left her property and she sold it and has money from that. Patient indicated she has $3000 in savings. Patient stated that she hasn't been feeling well lately and has been back and forth from the doctors. Patient stated that she plans to go home and call and get it turned back on. Patient stated that her daughter is a [facility] due to being a paraplegic and she was unable to care for her. Patient stated if she had to go to a SNF [place] would be the only one she would go to. CSW explained that [facility] had their admissions on hold. Patient stated that she can feed, bath, and dress herself. Patient stated that she does have a walker but can get around pretty well on her own. Patient asked if CSW and RN wanted to see her get up and walk. Patient spoke about her home being cluttered and wanting assistance. Patient did state there were boxes everywhere. Patient was unwilling to hire someone to help clean her home because the lady wanted to throu away the majority of her items in her home for $2000. Patient stated she has asked her family to help but they haven't made time for her. Patient goes to a [employer] who have been bringing her meals to her home. Patient stated that she does not believe she needs a SNF. Patient will follow-up with her wound care doctor. Patient is interested in a senior citizen community to socialize due to being by herself all the time.

## 2020-06-14 NOTE — ED Notes (Signed)
Pt states that she has not running water at home at this time.

## 2020-06-14 NOTE — Evaluation (Signed)
Physical Therapy Evaluation Patient Details Name: Amanda Butler MRN: [id number] DOB: 06/06/1955 Today's Date: 06/14/2020   History of Present Illness  Pt is a 65 y/o female presenting to the ED on 3/3 for chest pain. Workup unrevealing. PMH includesCHF and DM.  Clinical Impression  Pt presenting with problem above and deficits below. Pt requiring supervision for bed mobility and min guard A for transfers and gait with RW. Pt reports living alone at home, but reports she plans to return home. Reports she feels she can take care of herself and does not want to go to SNF. Will continue to follow acutely to maximize functional mobility independence and safety.     Follow Up Recommendations Home health PT    Equipment Recommendations  None recommended by PT    Recommendations for Other Services       Precautions / Restrictions Precautions Precautions: Fall Restrictions Weight Bearing Restrictions: No      Mobility  Bed Mobility Overal bed mobility: Needs Assistance Bed Mobility: Sit to Supine;Supine to Sit     Supine to sit: Supervision Sit to supine: Supervision   General bed mobility comments: supervision for safety.    Transfers Overall transfer level: Needs assistance Equipment used: Rolling walker (2 wheeled) Transfers: Sit to/from Stand Sit to Stand: Min guard         General transfer comment: Min guard for safety. Cues for hand placement.  Ambulation/Gait Ambulation/Gait assistance: Min guard Gait Distance (Feet): 5 Feet Assistive device: Rolling walker (2 wheeled) Gait Pattern/deviations: Step-through pattern;Decreased stride length Gait velocity: Decreased   General Gait Details: Ambulated short distance in room with min guard A and use of RW. Pt asymptomatic throughout.  Stairs            Wheelchair Mobility    Modified Rankin (Stroke Patients Only)       Balance Overall balance assessment: Needs assistance Sitting-balance support: No  upper extremity supported;Feet supported Sitting balance-Leahy Scale: Good     Standing balance support: Bilateral upper extremity supported;During functional activity Standing balance-Leahy Scale: Poor Standing balance comment: Reliant on BUE support                             Pertinent Vitals/Pain Pain Assessment: No/denies pain    Home Living Family/patient expects to be discharged to:: Private residence Living Arrangements: Alone Available Help at Discharge: Family;Available PRN/intermittently Type of Home: House Home Access: Ramped entrance     Home Layout: One level Home Equipment: Walker - 2 wheels;Wheelchair - manual;Bedside commode;Cane - single point;Shower seat      Prior Function Level of Independence: Independent with assistive device(s)         Comments: Uses cane inside the house and walker outside     Hand Dominance        Extremity/Trunk Assessment   Upper Extremity Assessment Upper Extremity Assessment: Generalized weakness    Lower Extremity Assessment Lower Extremity Assessment: LLE deficits/detail;RLE deficits/detail RLE Deficits / Details: Pt with some flaking skin at feet. LLE Deficits / Details: Pt with increased swelling in LLE and flaking from calf to feet.    Cervical / Trunk Assessment Cervical / Trunk Assessment: Normal  Communication   Communication: No difficulties  Cognition Arousal/Alertness: Awake/alert Behavior During Therapy: WFL for tasks assessed/performed Overall Cognitive Status: No family/caregiver present to determine baseline cognitive functioning  General Comments General comments (skin integrity, edema, etc.): Pt reports living alone, but wants to return home and does not want SNF.    Exercises     Assessment/Plan    PT Assessment Patient needs continued PT services  PT Problem List Decreased strength;Decreased balance;Decreased  mobility;Decreased activity tolerance;Decreased knowledge of use of DME;Decreased knowledge of precautions       PT Treatment Interventions DME instruction;Gait training;Therapeutic activities;Functional mobility training;Balance training;Therapeutic exercise;Patient/family education    PT Goals (Current goals can be found in the Care Plan section)  Acute Rehab PT Goals Patient Stated Goal: to go home PT Goal Formulation: With patient Time For Goal Achievement: 06/28/20 Potential to Achieve Goals: Good    Frequency Min 3X/week   Barriers to discharge Decreased caregiver support      Co-evaluation               AM-PAC PT "6 Clicks" Mobility  Outcome Measure Help needed turning from your back to your side while in a flat bed without using bedrails?: None Help needed moving from lying on your back to sitting on the side of a flat bed without using bedrails?: None Help needed moving to and from a bed to a chair (including a wheelchair)?: A Little Help needed standing up from a chair using your arms (e.g., wheelchair or bedside chair)?: A Little Help needed to walk in hospital room?: A Little Help needed climbing 3-5 steps with a railing? : A Little 6 Click Score: 20    End of Session Equipment Utilized During Treatment: Gait belt Activity Tolerance: Patient tolerated treatment well Patient left: in bed;with call bell/phone within reach (on stretcher in ED) Nurse Communication: Mobility status PT Visit Diagnosis: Other abnormalities of gait and mobility (R26.89)    Time: 1937-9024 PT Time Calculation (min) (ACUTE ONLY): 14 min   Charges:   PT Evaluation $PT Eval Low Complexity: 1 Low          Lou Miner, DPT  Acute Rehabilitation Services  Pager: [phone] Office: [phone]   Rudean Hitt 06/14/2020, 2:07 PM

## 2020-06-14 NOTE — ED Notes (Signed)
Pt states she is able to care for herself, but she needs help cleaning her home. Pt also stated water is currently shut off d/t unpaid bill she forgot to pay. Pt states she is able to pay the bill and will.

## 2020-06-14 NOTE — Discharge Instructions (Signed)
Follow-up for treatment of your leg swelling at the wound clinic for the physical therapy lymphedema clinic.  See your PCP for checkup next week.

## 2020-06-14 NOTE — ED Notes (Signed)
Contact daughter for update shalicia [place] [phone]

## 2020-06-14 NOTE — ED Provider Notes (Addendum)
Pt signed out by Dr. Darl Householder pending UA.  Pt's urine is infected and she is given a dose of rocephin IV.  Pt said she felt dizzy when she stands up.  Probably because her bp is now normal.  Pt said she does not have running water at home and is unable to care for herself.  I will get SW involved.  Meds ordered.  Pt will need abx at d/c for her cellulitis and her uti.   Isla Pence, MD 06/14/20 [id number]    Isla Pence, MD 06/14/20 [phone]

## 2020-06-14 NOTE — ED Notes (Signed)
[facility] here to transport pt home 

## 2020-06-16 LAB — URINE CULTURE: Culture: >=100000 — AB

## 2020-07-18 ENCOUNTER — Inpatient Hospital Stay: Admission: RE | Admit: 2020-07-18 | Payer: Medicare Other | Source: Ambulatory Visit

## 2020-09-07 ENCOUNTER — Other Ambulatory Visit (HOSPITAL_COMMUNITY): Payer: Self-pay

## 2021-01-20 ENCOUNTER — Emergency Department (HOSPITAL_COMMUNITY): Payer: Medicare Other

## 2021-01-20 ENCOUNTER — Other Ambulatory Visit: Payer: Self-pay

## 2021-01-20 ENCOUNTER — Inpatient Hospital Stay (HOSPITAL_COMMUNITY)
Admission: EM | Admit: 2021-01-20 | Discharge: 2021-01-23 | DRG: 305 | Disposition: A | Discharge status: Home / Self Care | Payer: Medicare Other | Attending: Family Medicine | Admitting: Family Medicine

## 2021-01-20 ENCOUNTER — Encounter (HOSPITAL_COMMUNITY): Payer: Self-pay | Admitting: Emergency Medicine

## 2021-01-20 DIAGNOSIS — R079 Chest pain, unspecified: Secondary | ICD-10-CM | POA: Diagnosis present

## 2021-01-20 DIAGNOSIS — L97929 Non-pressure chronic ulcer of unspecified part of left lower leg with unspecified severity: Secondary | ICD-10-CM | POA: Diagnosis present

## 2021-01-20 DIAGNOSIS — R634 Abnormal weight loss: Secondary | ICD-10-CM | POA: Diagnosis present

## 2021-01-20 DIAGNOSIS — L97911 Non-pressure chronic ulcer of unspecified part of right lower leg limited to breakdown of skin: Secondary | ICD-10-CM | POA: Diagnosis not present

## 2021-01-20 DIAGNOSIS — I110 Hypertensive heart disease with heart failure: Secondary | ICD-10-CM | POA: Diagnosis not present

## 2021-01-20 DIAGNOSIS — Z6841 Body Mass Index (BMI) 40.0 and over, adult: Secondary | ICD-10-CM | POA: Diagnosis not present

## 2021-01-20 DIAGNOSIS — I160 Hypertensive urgency: Principal | ICD-10-CM | POA: Diagnosis present

## 2021-01-20 DIAGNOSIS — J45909 Unspecified asthma, uncomplicated: Secondary | ICD-10-CM | POA: Diagnosis not present

## 2021-01-20 DIAGNOSIS — Z825 Family history of asthma and other chronic lower respiratory diseases: Secondary | ICD-10-CM

## 2021-01-20 DIAGNOSIS — Z8701 Personal history of pneumonia (recurrent): Secondary | ICD-10-CM

## 2021-01-20 DIAGNOSIS — Z886 Allergy status to analgesic agent status: Secondary | ICD-10-CM

## 2021-01-20 DIAGNOSIS — N390 Urinary tract infection, site not specified: Secondary | ICD-10-CM | POA: Diagnosis present

## 2021-01-20 DIAGNOSIS — K219 Gastro-esophageal reflux disease without esophagitis: Secondary | ICD-10-CM | POA: Diagnosis not present

## 2021-01-20 DIAGNOSIS — Z91040 Latex allergy status: Secondary | ICD-10-CM

## 2021-01-20 DIAGNOSIS — M1990 Unspecified osteoarthritis, unspecified site: Secondary | ICD-10-CM | POA: Diagnosis present

## 2021-01-20 DIAGNOSIS — Z833 Family history of diabetes mellitus: Secondary | ICD-10-CM

## 2021-01-20 DIAGNOSIS — I252 Old myocardial infarction: Secondary | ICD-10-CM | POA: Diagnosis not present

## 2021-01-20 DIAGNOSIS — R0789 Other chest pain: Secondary | ICD-10-CM | POA: Diagnosis present

## 2021-01-20 DIAGNOSIS — Z20822 Contact with and (suspected) exposure to covid-19: Secondary | ICD-10-CM | POA: Diagnosis not present

## 2021-01-20 DIAGNOSIS — E876 Hypokalemia: Secondary | ICD-10-CM | POA: Diagnosis not present

## 2021-01-20 DIAGNOSIS — I83029 Varicose veins of left lower extremity with ulcer of unspecified site: Secondary | ICD-10-CM | POA: Diagnosis not present

## 2021-01-20 DIAGNOSIS — I872 Venous insufficiency (chronic) (peripheral): Secondary | ICD-10-CM | POA: Diagnosis present

## 2021-01-20 DIAGNOSIS — R1030 Lower abdominal pain, unspecified: Secondary | ICD-10-CM | POA: Diagnosis present

## 2021-01-20 DIAGNOSIS — Z7984 Long term (current) use of oral hypoglycemic drugs: Secondary | ICD-10-CM

## 2021-01-20 DIAGNOSIS — I5032 Chronic diastolic (congestive) heart failure: Secondary | ICD-10-CM | POA: Diagnosis present

## 2021-01-20 DIAGNOSIS — F40240 Claustrophobia: Secondary | ICD-10-CM | POA: Diagnosis present

## 2021-01-20 DIAGNOSIS — L97921 Non-pressure chronic ulcer of unspecified part of left lower leg limited to breakdown of skin: Secondary | ICD-10-CM | POA: Diagnosis not present

## 2021-01-20 DIAGNOSIS — E781 Pure hyperglyceridemia: Secondary | ICD-10-CM | POA: Diagnosis present

## 2021-01-20 DIAGNOSIS — L97519 Non-pressure chronic ulcer of other part of right foot with unspecified severity: Secondary | ICD-10-CM | POA: Diagnosis present

## 2021-01-20 DIAGNOSIS — E7800 Pure hypercholesterolemia, unspecified: Secondary | ICD-10-CM | POA: Diagnosis not present

## 2021-01-20 DIAGNOSIS — E11621 Type 2 diabetes mellitus with foot ulcer: Secondary | ICD-10-CM | POA: Diagnosis present

## 2021-01-20 DIAGNOSIS — Z888 Allergy status to other drugs, medicaments and biological substances status: Secondary | ICD-10-CM

## 2021-01-20 DIAGNOSIS — E119 Type 2 diabetes mellitus without complications: Secondary | ICD-10-CM

## 2021-01-20 DIAGNOSIS — Z91041 Radiographic dye allergy status: Secondary | ICD-10-CM

## 2021-01-20 DIAGNOSIS — R2243 Localized swelling, mass and lump, lower limb, bilateral: Secondary | ICD-10-CM | POA: Diagnosis not present

## 2021-01-20 DIAGNOSIS — E1165 Type 2 diabetes mellitus with hyperglycemia: Secondary | ICD-10-CM | POA: Diagnosis not present

## 2021-01-20 DIAGNOSIS — I4510 Unspecified right bundle-branch block: Secondary | ICD-10-CM | POA: Diagnosis not present

## 2021-01-20 DIAGNOSIS — R739 Hyperglycemia, unspecified: Secondary | ICD-10-CM

## 2021-01-20 DIAGNOSIS — B964 Proteus (mirabilis) (morganii) as the cause of diseases classified elsewhere: Secondary | ICD-10-CM | POA: Diagnosis present

## 2021-01-20 DIAGNOSIS — Z885 Allergy status to narcotic agent status: Secondary | ICD-10-CM

## 2021-01-20 DIAGNOSIS — Z881 Allergy status to other antibiotic agents status: Secondary | ICD-10-CM

## 2021-01-20 DIAGNOSIS — Z8249 Family history of ischemic heart disease and other diseases of the circulatory system: Secondary | ICD-10-CM

## 2021-01-20 DIAGNOSIS — Z79899 Other long term (current) drug therapy: Secondary | ICD-10-CM

## 2021-01-20 DIAGNOSIS — I878 Other specified disorders of veins: Secondary | ICD-10-CM | POA: Diagnosis present

## 2021-01-20 DIAGNOSIS — R072 Precordial pain: Secondary | ICD-10-CM | POA: Diagnosis not present

## 2021-01-20 DIAGNOSIS — R600 Localized edema: Secondary | ICD-10-CM

## 2021-01-20 DIAGNOSIS — E11620 Type 2 diabetes mellitus with diabetic dermatitis: Secondary | ICD-10-CM | POA: Diagnosis not present

## 2021-01-20 LAB — TROPONIN I (HIGH SENSITIVITY): Troponin I (High Sensitivity): 9 ng/L (ref <18)

## 2021-01-20 LAB — BASIC METABOLIC PANEL
Anion gap: 8 (ref 5–15)
BUN: 17 mg/dL (ref 8–23)
CO2: 27 mmol/L (ref 22–32)
Calcium: 9.3 mg/dL (ref 8.9–10.3)
Chloride: 105 mmol/L (ref 98–111)
Creatinine, Ser: 0.93 mg/dL (ref 0.44–1.00)
GFR, Estimated: >60 mL/min (ref >60)
Glucose, Bld: 324 mg/dL — ABNORMAL HIGH (ref 70–99)
Potassium: 3.4 mmol/L — ABNORMAL LOW (ref 3.5–5.1)
Sodium: 140 mmol/L (ref 135–145)

## 2021-01-20 LAB — CBC
HCT: 39.6 % (ref 36.0–46.0)
Hemoglobin: 13.2 g/dL (ref 12.0–15.0)
MCH: 27.8 pg (ref 26.0–34.0)
MCHC: 33.3 g/dL (ref 30.0–36.0)
MCV: 83.4 fL (ref 80.0–100.0)
Platelets: 263 10*3/uL (ref 150–400)
RBC: 4.75 MIL/uL (ref 3.87–5.11)
RDW: 14.2 % (ref 11.5–15.5)
WBC: 8.5 10*3/uL (ref 4.0–10.5)
nRBC: 0 % (ref 0.0–0.2)

## 2021-01-20 MED ORDER — INSULIN ASPART 100 UNIT/ML IJ SOLN
10.0000 [IU] | Freq: Once | INTRAMUSCULAR | Status: AC
  Administered 2021-01-21: 10 [IU] via SUBCUTANEOUS
  Filled 2021-01-20: qty 0.1

## 2021-01-20 NOTE — ED Provider Notes (Signed)
Emergency Medicine Provider Triage Evaluation Note  Amanda Butler , a 65 y.o. female  was evaluated in triage.  Pt complains of central chest pain for the past hour.  She states overall for the past month she has been "feeling lousy" including fatigue, nasal congestion, nonproductive cough.  She reports that she has had some intermittent chest pain, but pain has been constant for the past hour.  She is also concerned she may have a urinary tract infection today with associated lower abdominal pain and lower back pain.  Review of Systems  Positive: Chest pain, lower abdominal pain, lower back pain, nasal congestion, cough Negative: Fevers, chills, SOB  Physical Exam  BP (!) 186/93 (BP Location: Right Arm)   Pulse (!) 107   Temp 97.7 F (36.5 C)   Resp 20   Ht 5\' 6"  (1.676 m)   Wt 113.4 kg   SpO2 97%   BMI 40.35 kg/m  Gen:   Awake, no distress   Resp:  Normal effort  MSK:   Moves extremities without difficulty  Other:    Medical Decision Making  Medically screening exam initiated at 8:20 PM.  Appropriate orders placed.  Amanda Butler was informed that the remainder of the evaluation will be completed by another provider, this initial triage assessment does not replace that evaluation, and the importance of remaining in the ED until their evaluation is complete.     Amanda Butler 01/20/21 2022    Amanda Fast, MD 01/21/21 [phone]

## 2021-01-20 NOTE — ED Triage Notes (Signed)
Patient c/o constant central chest pain radiating to L chest x1 hour.

## 2021-01-20 NOTE — ED Provider Notes (Signed)
[facility] DEPT Provider Note: Georgena Spurling, MD, FACEP  CSN: [id number] MRN: [id number] ARRIVAL: 01/20/21 at [place]: [place]  Chest Pain   HISTORY OF PRESENT ILLNESS  01/20/21 11:32 PM Amanda Butler is a 66 y.o. female with multiple medical problems.  She is here with chest pain that began about 7 PM.  She describes the pain as feeling like a "toddler" sitting on her chest.  It is worse with exertion and better with rest.  It is associated with shortness of breath and nausea.  It was moderate in severity on arrival but has improved subsequently.  She has also been having an abnormal urinary odor but no dysuria.  She has episodes of edema and wounds of her lower extremities and is having an episode currently that has been present for about 2 to 3 weeks.   Past Medical History:  Diagnosis Date   Arthritis    "knees" (09/13/2015)   Asthma    Cellulitis    Cellulitis 11/17/2018   LEFT LEG    Chronic diastolic CHF (congestive heart failure) ([place])    a. 09/2015 Echo: EF 55-60%, mild LVH, gr2DD, no rwma, mild MR, mildly dil LA.    GERD (gastroesophageal reflux disease)    High cholesterol    Hypertension    Midsternal chest pain    a. 09/2015 MV: no ischemia/infarct, EF 61%; b. 09/2015 Low prob V:Q scan.   Morbid obesity ([place])    Obesity    Pneumonia ~ 2000   "mild case"   RBBB (right bundle branch block) approx 2010   Dr Marisue Humble did ECG and told her   Type II diabetes mellitus ([place])     Past Surgical History:  Procedure Laterality Date   BUNIONECTOMY Left    COLONOSCOPY WITH PROPOFOL N/A 09/23/2016   Procedure: COLONOSCOPY WITH PROPOFOL;  Surgeon: Garlan Fair, MD;  Location: [facility];  Service: Endoscopy;  Laterality: N/A;   MULTIPLE TOOTH EXTRACTIONS  ~ 1975   TENDON RELEASE Right    Leg   TUBAL LIGATION  ~ 1983    Family History  Problem Relation Age of Onset   Diabetes Mother    Hypertension Mother    Diabetes Father     Hypertension Father    Heart attack Father 35   Diabetes Maternal Aunt    Diabetes Maternal Grandmother    Hypertension Maternal Grandmother    Cancer Maternal Grandfather    Diabetes Maternal Grandfather    Hypertension Maternal Grandfather    Asthma Paternal Grandmother    Diabetes Paternal Grandmother    Hypertension Paternal Grandmother    Diabetes Paternal Grandfather    Hypertension Paternal Grandfather    Heart failure Paternal Grandfather     Social History   Tobacco Use   Smoking status: Never   Smokeless tobacco: Never  Vaping Use   Vaping Use: Never used  Substance Use Topics   Alcohol use: No   Drug use: No    Prior to Admission medications   Medication Sig Start Date End Date Taking? Authorizing Provider  acetaminophen (TYLENOL) 325 MG tablet Take 650 mg by mouth every 6 (six) hours as needed for moderate pain.   Yes [provider]  glimepiride (AMARYL) 4 MG tablet Take 1 tablet (4 mg total) by mouth every morning. 11/19/18 01/21/21 Yes Black, Lezlie Octave, NP  lisinopril (ZESTRIL) 10 MG tablet Take 10 mg by mouth daily. 06/09/19  Yes [provider]  metoprolol  succinate (TOPROL-XL) 50 MG 24 hr tablet Take 50 mg by mouth daily. Take with or immediately following a meal.   Yes [provider]  repaglinide (PRANDIN) 1 MG tablet Take 1 mg by mouth See admin instructions. Take 1 tablet 15 to 30 mins before meals once a day for diabetes 08/19/18  Yes [provider]  rosuvastatin (CRESTOR) 20 MG tablet Take 20 mg by mouth daily.   Yes [provider]  Skin Protectants, Misc. (EUCERIN) cream Apply 1 application topically as needed for dry skin.   Yes [provider]  triamterene-hydrochlorothiazide (MAXZIDE) 75-50 MG per tablet Take 1 tablet by mouth daily.   Yes [provider]  amoxicillin-clavulanate (AUGMENTIN) 875-125 MG tablet Take 1 tablet by mouth 2 (two) times daily. One po bid x 7 days Patient not taking:  Reported on 01/21/2021 06/14/20   Daleen Bo, MD  amoxicillin-clavulanate (AUGMENTIN) 875-125 MG tablet TAKE 1 TABLET BY MOUTH TWO TIMES DAILY.X 7 DAYS Patient not taking: No sig reported 06/14/20 06/14/21  Daleen Bo, MD  mometasone (NASONEX) 50 MCG/ACT nasal spray Place 2 sprays into the nose daily as needed (congestion).     [provider]    Allergies Bee venom, Iodine, Shellfish allergy, Actos [pioglitazone], Aspirin, Celecoxib, Latex, Metformin and related, Morphine and related, Nsaids, Other, and Ciprofloxacin   REVIEW OF SYSTEMS  Negative except as noted here or in the History of Present Illness.   PHYSICAL EXAMINATION  Initial Vital Signs Blood pressure (!) 186/93, pulse (!) 107, temperature 97.7 F (36.5 C), resp. rate 20, height 5\' 6"  (1.676 m), weight 113.4 kg, SpO2 97 %.  Examination General: Well-developed, well-nourished female in no acute distress; appears older than age of record HENT: normocephalic; atraumatic Eyes: pupils equal, round and reactive to light; extraocular muscles intact Neck: supple Heart: regular rate and rhythm Lungs: clear to auscultation bilaterally Abdomen: soft; nondistended; nontender; bowel sounds present Extremities: No bony deformity Neurologic: Awake, alert and oriented; motor function intact in all extremities and symmetric; no facial droop Skin: Warm and dry; chronic appearing edema and ulcerations of lower legs and feet:      Psychiatric: Normal mood and affect   RESULTS  Summary of this visit's results, reviewed and interpreted by myself:   EKG Interpretation  Date/Time:  Sunday January 20 2021 20:05:42 EDT Ventricular Rate:  106 PR Interval:  164 QRS Duration: 131 QT Interval:  374 QTC Calculation: 497 R Axis:   32 Text Interpretation: Sinus tachycardia Right bundle branch block Left ventricular hypertrophy Borderline prolonged QT interval Rate is faster Confirmed by Shyan Scalisi [phone]) on 01/20/2021  11:32:07 PM       Laboratory Studies: Results for orders placed or performed during the hospital encounter of 01/20/21 (from the past 24 hour(s))  Basic metabolic panel     Status: Abnormal   Collection Time: 01/20/21  8:30 PM  Result Value Ref Range   Sodium 140 135 - 145 mmol/L   Potassium 3.4 (L) 3.5 - 5.1 mmol/L   Chloride 105 98 - 111 mmol/L   CO2 27 22 - 32 mmol/L   Glucose, Bld 324 (H) 70 - 99 mg/dL   BUN 17 8 - 23 mg/dL   Creatinine, Ser 0.93 0.44 - 1.00 mg/dL   Calcium 9.3 8.9 - 10.3 mg/dL   GFR, Estimated >60 >60 mL/min   Anion gap 8 5 - 15  CBC     Status: None   Collection Time: 01/20/21  8:30 PM  Result  Value Ref Range   WBC 8.5 4.0 - 10.5 K/uL   RBC 4.75 3.87 - 5.11 MIL/uL   Hemoglobin 13.2 12.0 - 15.0 g/dL   HCT 39.6 36.0 - 46.0 %   MCV 83.4 80.0 - 100.0 fL   MCH 27.8 26.0 - 34.0 pg   MCHC 33.3 30.0 - 36.0 g/dL   RDW 14.2 11.5 - 15.5 %   Platelets 263 150 - 400 K/uL   nRBC 0.0 0.0 - 0.2 %  Troponin I (High Sensitivity)     Status: None   Collection Time: 01/20/21  8:30 PM  Result Value Ref Range   Troponin I (High Sensitivity) 9 <18 ng/L  Urinalysis, Routine w reflex microscopic     Status: Abnormal   Collection Time: 01/21/21  1:00 AM  Result Value Ref Range   Color, Urine YELLOW YELLOW   APPearance HAZY (A) CLEAR   Specific Gravity, Urine 1.010 1.005 - 1.030   pH 8.0 5.0 - 8.0   Glucose, UA >=500 (A) NEGATIVE mg/dL   Hgb urine dipstick NEGATIVE NEGATIVE   Bilirubin Urine NEGATIVE NEGATIVE   Ketones, ur 5 (A) NEGATIVE mg/dL   Protein, ur 30 (A) NEGATIVE mg/dL   Nitrite POSITIVE (A) NEGATIVE   Leukocytes,Ua SMALL (A) NEGATIVE  Urinalysis, Microscopic (reflex)     Status: Abnormal   Collection Time: 01/21/21  1:00 AM  Result Value Ref Range   RBC / HPF NONE SEEN 0 - 5 RBC/hpf   WBC, UA 6-10 0 - 5 WBC/hpf   Bacteria, UA MANY (A) NONE SEEN   Squamous Epithelial / LPF NONE SEEN 0 - 5   Crystals AMMONIUM BIURATE CRYSTALS PRESENT (A) NEGATIVE   Troponin I (High Sensitivity)     Status: None   Collection Time: 01/21/21  1:06 AM  Result Value Ref Range   Troponin I (High Sensitivity) 11 <18 ng/L   Imaging Studies: DG Chest 2 View  Result Date: 01/20/2021 CLINICAL DATA:  Chest pain EXAM: CHEST - 2 VIEW COMPARISON:  06/13/2020 chest radiograph. FINDINGS: Stable cardiomediastinal silhouette with normal heart size. No pneumothorax. No pleural effusion. Lungs appear clear, with no acute consolidative airspace disease and no pulmonary edema. IMPRESSION: No active cardiopulmonary disease. Electronically Signed   By: Ilona Sorrel M.D.   On: 01/20/2021 20:49    ED COURSE and MDM  Nursing notes, initial and subsequent vitals signs, including pulse oximetry, reviewed and interpreted by myself.  Vitals:   01/20/21 2338 01/21/21 0015 01/21/21 0045 01/21/21 0130  BP: (!) 205/173 (!) 167/66 (!) 178/67 (!) 185/84  Pulse: (!) 106 91 (!) 55 78  Resp: 18 17 15 14   Temp:      SpO2: 98% 95% 95% 95%  Weight:      Height:       Medications  insulin aspart (novoLOG) injection 10 Units (10 Units Subcutaneous Given 01/21/21 0000)   Will have patient admitted for her exertional chest pain as well as her acute on chronic skin problems of the lower extremities.   PROCEDURES  Procedures   ED DIAGNOSES     ICD-10-CM   1. Exertional chest pain  R07.9     2. Ulcers of both lower extremities, limited to breakdown of skin ([place])  L97.911    L97.921     3. Bilateral lower extremity edema  R60.0     4. Hyperglycemia  R73.9          Bertine Schlottman, Jenny Reichmann, MD 01/21/21 [phone]

## 2021-01-21 ENCOUNTER — Encounter (HOSPITAL_COMMUNITY): Payer: Self-pay | Admitting: Family Medicine

## 2021-01-21 ENCOUNTER — Inpatient Hospital Stay (HOSPITAL_COMMUNITY): Payer: Medicare Other

## 2021-01-21 DIAGNOSIS — Z20822 Contact with and (suspected) exposure to covid-19: Secondary | ICD-10-CM | POA: Diagnosis present

## 2021-01-21 DIAGNOSIS — I5032 Chronic diastolic (congestive) heart failure: Secondary | ICD-10-CM | POA: Diagnosis present

## 2021-01-21 DIAGNOSIS — E876 Hypokalemia: Secondary | ICD-10-CM | POA: Diagnosis present

## 2021-01-21 DIAGNOSIS — F40240 Claustrophobia: Secondary | ICD-10-CM | POA: Diagnosis present

## 2021-01-21 DIAGNOSIS — R072 Precordial pain: Secondary | ICD-10-CM | POA: Diagnosis not present

## 2021-01-21 DIAGNOSIS — R079 Chest pain, unspecified: Secondary | ICD-10-CM

## 2021-01-21 DIAGNOSIS — M1990 Unspecified osteoarthritis, unspecified site: Secondary | ICD-10-CM | POA: Diagnosis present

## 2021-01-21 DIAGNOSIS — K219 Gastro-esophageal reflux disease without esophagitis: Secondary | ICD-10-CM | POA: Diagnosis present

## 2021-01-21 DIAGNOSIS — I4510 Unspecified right bundle-branch block: Secondary | ICD-10-CM | POA: Diagnosis present

## 2021-01-21 DIAGNOSIS — E1165 Type 2 diabetes mellitus with hyperglycemia: Secondary | ICD-10-CM | POA: Diagnosis present

## 2021-01-21 DIAGNOSIS — I160 Hypertensive urgency: Principal | ICD-10-CM

## 2021-01-21 DIAGNOSIS — R634 Abnormal weight loss: Secondary | ICD-10-CM | POA: Diagnosis present

## 2021-01-21 DIAGNOSIS — B964 Proteus (mirabilis) (morganii) as the cause of diseases classified elsewhere: Secondary | ICD-10-CM | POA: Diagnosis present

## 2021-01-21 DIAGNOSIS — L97921 Non-pressure chronic ulcer of unspecified part of left lower leg limited to breakdown of skin: Secondary | ICD-10-CM

## 2021-01-21 DIAGNOSIS — N390 Urinary tract infection, site not specified: Secondary | ICD-10-CM

## 2021-01-21 DIAGNOSIS — E11620 Type 2 diabetes mellitus with diabetic dermatitis: Secondary | ICD-10-CM

## 2021-01-21 DIAGNOSIS — R0789 Other chest pain: Secondary | ICD-10-CM | POA: Diagnosis present

## 2021-01-21 DIAGNOSIS — L97929 Non-pressure chronic ulcer of unspecified part of left lower leg with unspecified severity: Secondary | ICD-10-CM | POA: Diagnosis present

## 2021-01-21 DIAGNOSIS — L97911 Non-pressure chronic ulcer of unspecified part of right lower leg limited to breakdown of skin: Secondary | ICD-10-CM

## 2021-01-21 DIAGNOSIS — E7800 Pure hypercholesterolemia, unspecified: Secondary | ICD-10-CM | POA: Diagnosis present

## 2021-01-21 DIAGNOSIS — Z6841 Body Mass Index (BMI) 40.0 and over, adult: Secondary | ICD-10-CM | POA: Diagnosis not present

## 2021-01-21 DIAGNOSIS — I83029 Varicose veins of left lower extremity with ulcer of unspecified site: Secondary | ICD-10-CM | POA: Diagnosis present

## 2021-01-21 DIAGNOSIS — I872 Venous insufficiency (chronic) (peripheral): Secondary | ICD-10-CM

## 2021-01-21 DIAGNOSIS — L97519 Non-pressure chronic ulcer of other part of right foot with unspecified severity: Secondary | ICD-10-CM | POA: Diagnosis present

## 2021-01-21 DIAGNOSIS — I110 Hypertensive heart disease with heart failure: Secondary | ICD-10-CM | POA: Diagnosis present

## 2021-01-21 DIAGNOSIS — E11621 Type 2 diabetes mellitus with foot ulcer: Secondary | ICD-10-CM | POA: Diagnosis present

## 2021-01-21 DIAGNOSIS — I252 Old myocardial infarction: Secondary | ICD-10-CM | POA: Diagnosis not present

## 2021-01-21 DIAGNOSIS — E781 Pure hyperglyceridemia: Secondary | ICD-10-CM | POA: Diagnosis present

## 2021-01-21 DIAGNOSIS — J45909 Unspecified asthma, uncomplicated: Secondary | ICD-10-CM | POA: Diagnosis present

## 2021-01-21 LAB — URINALYSIS, ROUTINE W REFLEX MICROSCOPIC
Bilirubin Urine: NEGATIVE
Glucose, UA: >=500 mg/dL — AB
Hgb urine dipstick: NEGATIVE
Ketones, ur: 5 mg/dL — AB
Nitrite: POSITIVE — AB
Protein, ur: 30 mg/dL — AB
Specific Gravity, Urine: 1.01 (ref 1.005–1.030)
pH: 8 (ref 5.0–8.0)

## 2021-01-21 LAB — ECHOCARDIOGRAM COMPLETE
Area-P 1/2: 3.42 cm2
Calc EF: 65.3 %
Height: 66 in
S' Lateral: 2.9 cm
Single Plane A2C EF: 56.6 %
Single Plane A4C EF: 71.9 %
Weight: 4000 oz

## 2021-01-21 LAB — BASIC METABOLIC PANEL
Anion gap: 6 (ref 5–15)
BUN: 13 mg/dL (ref 8–23)
CO2: 27 mmol/L (ref 22–32)
Calcium: 8.7 mg/dL — ABNORMAL LOW (ref 8.9–10.3)
Chloride: 104 mmol/L (ref 98–111)
Creatinine, Ser: 0.64 mg/dL (ref 0.44–1.00)
GFR, Estimated: >60 mL/min (ref >60)
Glucose, Bld: 201 mg/dL — ABNORMAL HIGH (ref 70–99)
Potassium: 2.9 mmol/L — ABNORMAL LOW (ref 3.5–5.1)
Sodium: 137 mmol/L (ref 135–145)

## 2021-01-21 LAB — URINALYSIS, MICROSCOPIC (REFLEX)
RBC / HPF: NONE SEEN RBC/hpf (ref 0–5)
Squamous Epithelial / HPF: NONE SEEN (ref 0–5)

## 2021-01-21 LAB — HIV ANTIBODY (ROUTINE TESTING W REFLEX): HIV Screen 4th Generation wRfx: NONREACTIVE

## 2021-01-21 LAB — HEMOGLOBIN A1C
Hgb A1c MFr Bld: 9.9 % — ABNORMAL HIGH (ref 4.8–5.6)
Mean Plasma Glucose: 237.43 mg/dL

## 2021-01-21 LAB — MAGNESIUM: Magnesium: 1.8 mg/dL (ref 1.7–2.4)

## 2021-01-21 LAB — RESP PANEL BY RT-PCR (FLU A&B, COVID) ARPGX2
Influenza A by PCR: NEGATIVE
Influenza B by PCR: NEGATIVE
SARS Coronavirus 2 by RT PCR: NEGATIVE

## 2021-01-21 LAB — GLUCOSE, CAPILLARY
Glucose-Capillary: 261 mg/dL — ABNORMAL HIGH (ref 70–99)
Glucose-Capillary: 253 mg/dL — ABNORMAL HIGH (ref 70–99)

## 2021-01-21 LAB — CBG MONITORING, ED
Glucose-Capillary: 214 mg/dL — ABNORMAL HIGH (ref 70–99)
Glucose-Capillary: 303 mg/dL — ABNORMAL HIGH (ref 70–99)

## 2021-01-21 LAB — TROPONIN I (HIGH SENSITIVITY): Troponin I (High Sensitivity): 11 ng/L (ref <18)

## 2021-01-21 MED ORDER — PERFLUTREN LIPID MICROSPHERE
1.0000 mL | INTRAVENOUS | Status: AC | PRN
  Administered 2021-01-21: 2 mL via INTRAVENOUS

## 2021-01-21 MED ORDER — ONDANSETRON HCL 4 MG/2ML IJ SOLN: 4.0000 mg | Freq: Four times a day (QID) | INTRAMUSCULAR | Status: DC | PRN

## 2021-01-21 MED ORDER — SODIUM CHLORIDE 0.9 % IV SOLN
1.0000 g | INTRAVENOUS | Status: DC
  Administered 2021-01-21 – 2021-01-23 (×3): 1 g via INTRAVENOUS
  Filled 2021-01-21 (×3): qty 10

## 2021-01-21 MED ORDER — METOPROLOL SUCCINATE ER 50 MG PO TB24
50.0000 mg | ORAL_TABLET | Freq: Every day | ORAL | Status: DC
  Administered 2021-01-21 – 2021-01-23 (×2): 50 mg via ORAL
  Filled 2021-01-21 (×3): qty 1

## 2021-01-21 MED ORDER — POTASSIUM CHLORIDE CRYS ER 10 MEQ PO TBCR
10.0000 meq | EXTENDED_RELEASE_TABLET | Freq: Every day | ORAL | Status: DC
  Administered 2021-01-22 – 2021-01-23 (×2): 10 meq via ORAL
  Filled 2021-01-21 (×2): qty 1

## 2021-01-21 MED ORDER — LISINOPRIL 20 MG PO TABS
20.0000 mg | ORAL_TABLET | Freq: Every day | ORAL | Status: DC
  Administered 2021-01-21 – 2021-01-23 (×3): 20 mg via ORAL
  Filled 2021-01-21 (×3): qty 1

## 2021-01-21 MED ORDER — NITROGLYCERIN 2 % TD OINT
0.5000 [in_us] | TOPICAL_OINTMENT | Freq: Once | TRANSDERMAL | Status: AC
  Administered 2021-01-21: 0.5 [in_us] via TOPICAL
  Filled 2021-01-21: qty 1

## 2021-01-21 MED ORDER — INSULIN ASPART 100 UNIT/ML IJ SOLN
0.0000 [IU] | Freq: Three times a day (TID) | INTRAMUSCULAR | Status: DC
  Administered 2021-01-21: 7 [IU] via SUBCUTANEOUS
  Administered 2021-01-21: 3 [IU] via SUBCUTANEOUS
  Administered 2021-01-21: 5 [IU] via SUBCUTANEOUS
  Administered 2021-01-22: 3 [IU] via SUBCUTANEOUS
  Administered 2021-01-22: 5 [IU] via SUBCUTANEOUS
  Administered 2021-01-22: 3 [IU] via SUBCUTANEOUS
  Administered 2021-01-23: 5 [IU] via SUBCUTANEOUS
  Administered 2021-01-23: 3 [IU] via SUBCUTANEOUS
  Filled 2021-01-21: qty 0.09

## 2021-01-21 MED ORDER — TRIAMTERENE-HCTZ 75-50 MG PO TABS
1.0000 | ORAL_TABLET | Freq: Every day | ORAL | Status: DC
  Administered 2021-01-21 – 2021-01-23 (×3): 1 via ORAL
  Filled 2021-01-21 (×3): qty 1

## 2021-01-21 MED ORDER — INSULIN ASPART 100 UNIT/ML IJ SOLN
0.0000 [IU] | Freq: Every day | INTRAMUSCULAR | Status: DC
  Administered 2021-01-21 – 2021-01-22 (×2): 3 [IU] via SUBCUTANEOUS
  Filled 2021-01-21: qty 0.05

## 2021-01-21 MED ORDER — ENOXAPARIN SODIUM 40 MG/0.4ML IJ SOSY
40.0000 mg | PREFILLED_SYRINGE | INTRAMUSCULAR | Status: DC
  Administered 2021-01-21 – 2021-01-23 (×3): 40 mg via SUBCUTANEOUS
  Filled 2021-01-21 (×3): qty 0.4

## 2021-01-21 MED ORDER — POTASSIUM CHLORIDE CRYS ER 20 MEQ PO TBCR
40.0000 meq | EXTENDED_RELEASE_TABLET | Freq: Once | ORAL | Status: AC
  Administered 2021-01-21: 40 meq via ORAL
  Filled 2021-01-21: qty 2

## 2021-01-21 MED ORDER — POTASSIUM CHLORIDE 10 MEQ/100ML IV SOLN
10.0000 meq | INTRAVENOUS | Status: AC
  Administered 2021-01-21 (×2): 10 meq via INTRAVENOUS
  Filled 2021-01-21 (×2): qty 100

## 2021-01-21 MED ORDER — METOPROLOL SUCCINATE ER 50 MG PO TB24: 50.0000 mg | ORAL_TABLET | Freq: Every day | ORAL | Status: DC

## 2021-01-21 MED ORDER — MAGNESIUM SULFATE IN D5W 1-5 GM/100ML-% IV SOLN
1.0000 g | Freq: Once | INTRAVENOUS | Status: AC
  Administered 2021-01-21: 1 g via INTRAVENOUS
  Filled 2021-01-21: qty 100

## 2021-01-21 MED ORDER — ROSUVASTATIN CALCIUM 10 MG PO TABS
20.0000 mg | ORAL_TABLET | Freq: Every day | ORAL | Status: DC
  Administered 2021-01-21 – 2021-01-22 (×2): 20 mg via ORAL
  Filled 2021-01-21: qty 2
  Filled 2021-01-21: qty 1

## 2021-01-21 MED ORDER — GLIPIZIDE ER 5 MG PO TB24
10.0000 mg | ORAL_TABLET | Freq: Every day | ORAL | Status: DC
  Administered 2021-01-22: 10 mg via ORAL
  Filled 2021-01-21: qty 2

## 2021-01-21 MED ORDER — ACETAMINOPHEN 325 MG PO TABS
650.0000 mg | ORAL_TABLET | ORAL | Status: DC | PRN
Start: 2021-01-21 — End: 2021-01-23
  Administered 2021-01-21: 650 mg via ORAL
  Filled 2021-01-21: qty 2

## 2021-01-21 MED ORDER — LISINOPRIL 10 MG PO TABS: 10.0000 mg | ORAL_TABLET | Freq: Every day | ORAL | Status: DC

## 2021-01-21 NOTE — Progress Notes (Signed)
Inpatient Diabetes Program Recommendations  AACE/ADA: New Consensus Statement on Inpatient Glycemic Control (2015)  Target Ranges:  Prepandial:   less than 140 mg/dL      Peak postprandial:   less than 180 mg/dL (1-2 hours)      Critically ill patients:  140 - 180 mg/dL   Lab Results  Component Value Date   GLUCAP 303 (H) 01/21/2021   HGBA1C 9.9 (H) 01/21/2021    Review of Glycemic Control  Diabetes history: DM 2 Outpatient Diabetes medications: Amaryl 4 mg Daily, Prandin 1 mg tid Current orders for Inpatient glycemic control:  Novolog 0-9 units tid + hs  Inpatient Diabetes Program Recommendations:    -  Start Semglee 30 units  Spoke with pt at bedside regarding A1c level and glucose control at home. Pt reports being allergic to many vegetables due to the iodine content in it. Pt reports bad arthritis and unable to consistently take insulin via an insulin pen, however pt was able to show me today by herself how to place a needle on the end of the insulin pen and dial the dose up on the other end. Pt reports good and bad days with her arthritis. Discussed glucose goals. Discussed options with her home medication doses, pt still has some room to increase doses of her home meds. Pt was only taking prandin once a day instead of before every meal as prescribed.  Pt had wet socks as she was unable to make it to bsc without getting urine on the floor and socks. When I took socks off, some of her skin came off leaving raw open skin areas. Pt reports this happens at home as well. Left socks off and placed a pad underneath legs.  Thanks,  Tama Headings RN, MSN, BC-ADM Inpatient Diabetes Coordinator Team Pager [phone] (8a-5p)

## 2021-01-21 NOTE — H&P (Addendum)
History and Physical    Amanda Butler JJO:[id number] DOB: 1955/06/13 DOA: 01/20/2021  PCP: Gaynelle Arabian, MD   Patient coming from: Home   Chief Complaint: Chest pain, dysuria, lower abdominal pain   HPI: Amanda Butler is a 65 y.o. female with medical history significant for hypertension, type 2 diabetes mellitus, chronic venous stasis with ulcers, chronic diastolic CHF, asthma, and BMI 40, now presenting to the emergency department for evaluation of chest pain, dysuria, and lower abdominal pain.  The patient reports that she had a heart attack 2 years ago and has continued to have chest discomfort when exerting herself with chores around the house since then.  She reports that this chest discomfort has worsened recently and has been constant since yesterday afternoon or evening.  Pain radiates from the central chest towards the left, sometimes associated with nausea and diaphoresis, and usually better with rest but not last night.  She also complains of 2 to 3 days of dysuria with suprapubic pain.  ED Course: Upon arrival to the ED, patient is found to be afebrile, saturating well on room air, and with blood pressure 160 systolic.  EKG features sinus tachycardia with chronic RBBB, LVH, and QTc of 497 ms.  Chest x-ray negative for acute cardiopulmonary disease.  Chemistry panel with glucose 324 and potassium 3.4.  CBC unremarkable.  Troponin normal x2.  Patient was treated with subcutaneous insulin in the ED.  Review of Systems:  All other systems reviewed and apart from HPI, are negative.  Past Medical History:  Diagnosis Date   Arthritis    "knees" (09/13/2015)   Asthma    Cellulitis    Cellulitis 11/17/2018   LEFT LEG    Chronic diastolic CHF (congestive heart failure) ([place])    a. 09/2015 Echo: EF 55-60%, mild LVH, gr2DD, no rwma, mild MR, mildly dil LA.    GERD (gastroesophageal reflux disease)    High cholesterol    Hypertension    Midsternal chest pain    a. 09/2015 MV: no  ischemia/infarct, EF 61%; b. 09/2015 Low prob V:Q scan.   Morbid obesity ([employer])    Obesity    Pneumonia ~ 2000   "mild case"   RBBB (right bundle branch block) approx 2010   Dr Marisue Humble did ECG and told her   Type II diabetes mellitus ([place])     Past Surgical History:  Procedure Laterality Date   BUNIONECTOMY Left    COLONOSCOPY WITH PROPOFOL N/A 09/23/2016   Procedure: COLONOSCOPY WITH PROPOFOL;  Surgeon: Garlan Fair, MD;  Location: [facility];  Service: Endoscopy;  Laterality: N/A;   MULTIPLE TOOTH EXTRACTIONS  ~ [place] Right    Leg   TUBAL LIGATION  ~ 1983    Social History:   reports that she has never smoked. She has never used smokeless tobacco. She reports that she does not drink alcohol and does not use drugs.  Allergies  Allergen Reactions   Bee Venom Anaphylaxis   Iodine Shortness Of Breath and Rash   Shellfish Allergy Anaphylaxis   Actos [Pioglitazone] Other (See Comments)    Erratic heartbeat   Aspirin Hives   Celecoxib     Other reaction(s): Unknown   Latex Hives   Metformin And Related Other (See Comments)    Patient reports flu-like symptoms and fatigue   Morphine And Related Other (See Comments)    Childhood allergy (65 years old) , pt got sicker , temp went up, dr gave her  the wrong medication   Nsaids Hives   Other     Xray dye   Ciprofloxacin Palpitations    Joint pain    Family History  Problem Relation Age of Onset   Diabetes Mother    Hypertension Mother    Diabetes Father    Hypertension Father    Heart attack Father 39   Diabetes Maternal Aunt    Diabetes Maternal Grandmother    Hypertension Maternal Grandmother    Cancer Maternal Grandfather    Diabetes Maternal Grandfather    Hypertension Maternal Grandfather    Asthma Paternal Grandmother    Diabetes Paternal Grandmother    Hypertension Paternal Grandmother    Diabetes Paternal Grandfather    Hypertension Paternal Grandfather    Heart failure Paternal  Grandfather      Prior to Admission medications   Medication Sig Start Date End Date Taking? Authorizing Provider  acetaminophen (TYLENOL) 325 MG tablet Take 650 mg by mouth every 6 (six) hours as needed for moderate pain.   Yes [provider]  glimepiride (AMARYL) 4 MG tablet Take 1 tablet (4 mg total) by mouth every morning. 11/19/18 01/21/21 Yes Black, Lezlie Octave, NP  lisinopril (ZESTRIL) 10 MG tablet Take 10 mg by mouth daily. 06/09/19  Yes [provider]  metoprolol succinate (TOPROL-XL) 50 MG 24 hr tablet Take 50 mg by mouth daily. Take with or immediately following a meal.   Yes [provider]  repaglinide (PRANDIN) 1 MG tablet Take 1 mg by mouth See admin instructions. Take 1 tablet 15 to 30 mins before meals once a day for diabetes 08/19/18  Yes [provider]  rosuvastatin (CRESTOR) 20 MG tablet Take 20 mg by mouth daily.   Yes [provider]  Skin Protectants, Misc. (EUCERIN) cream Apply 1 application topically as needed for dry skin.   Yes [provider]  triamterene-hydrochlorothiazide (MAXZIDE) 75-50 MG per tablet Take 1 tablet by mouth daily.   Yes [provider]  amoxicillin-clavulanate (AUGMENTIN) 875-125 MG tablet Take 1 tablet by mouth 2 (two) times daily. One po bid x 7 days Patient not taking: Reported on 01/21/2021 06/14/20   Daleen Bo, MD  amoxicillin-clavulanate (AUGMENTIN) 875-125 MG tablet TAKE 1 TABLET BY MOUTH TWO TIMES DAILY.X 7 DAYS Patient not taking: No sig reported 06/14/20 06/14/21  Daleen Bo, MD  mometasone (NASONEX) 50 MCG/ACT nasal spray Place 2 sprays into the nose daily as needed (congestion).     [provider]    Physical Exam: Vitals:   01/21/21 0045 01/21/21 0130 01/21/21 0245 01/21/21 0315  BP: (!) 178/67 (!) 185/84 (!) 171/77 (!) 165/66  Pulse: (!) 55 78 76 77  Resp: 15 14 15 14   Temp:      SpO2: 95% 95% 98% 96%  Weight:      Height:        Constitutional: NAD,  calm  Eyes: PERTLA, lids and conjunctivae normal ENMT: Mucous membranes are moist. Posterior pharynx clear of any exudate or lesions.   Neck: supple, no masses  Respiratory: no wheezing, no crackles. No accessory muscle use.  Cardiovascular: S1 & S2 heard, regular rate and rhythm. No significant JVD. Abdomen: No distension, no tenderness, soft. Bowel sounds active.  Musculoskeletal: no clubbing / cyanosis. No joint deformity upper and lower extremities.   Skin: Hyperpigmentation and swelling to lower legs with ulcerations. Warm, dry, well-perfused. Neurologic: CN 2-12 grossly intact. Moving all extremities. Alert and oriented.  Psychiatric: Pleasant. Cooperative.  Labs and Imaging on Admission: I have personally reviewed following labs and imaging studies  CBC: Recent Labs  Lab 01/20/21 2030  WBC 8.5  HGB 13.2  HCT 39.6  MCV 83.4  PLT 528   Basic Metabolic Panel: Recent Labs  Lab 01/20/21 2030 01/21/21 0232  NA 140 137  K 3.4* 2.9*  CL 105 104  CO2 27 27  GLUCOSE 324* 201*  BUN 17 13  CREATININE 0.93 0.64  CALCIUM 9.3 8.7*  MG  --  1.8   GFR: Estimated Creatinine Clearance: 87.2 mL/min (by C-G formula based on SCr of 0.64 mg/dL). Liver Function Tests: No results for input(s): AST, ALT, ALKPHOS, BILITOT, PROT, ALBUMIN in the last 168 hours. No results for input(s): LIPASE, AMYLASE in the last 168 hours. No results for input(s): AMMONIA in the last 168 hours. Coagulation Profile: No results for input(s): INR, PROTIME in the last 168 hours. Cardiac Enzymes: No results for input(s): CKTOTAL, CKMB, CKMBINDEX, TROPONINI in the last 168 hours. BNP (last 3 results) No results for input(s): PROBNP in the last 8760 hours. HbA1C: No results for input(s): HGBA1C in the last 72 hours. CBG: No results for input(s): GLUCAP in the last 168 hours. Lipid Profile: No results for input(s): CHOL, HDL, LDLCALC, TRIG, CHOLHDL, LDLDIRECT in the last 72 hours. Thyroid Function  Tests: No results for input(s): TSH, T4TOTAL, FREET4, T3FREE, THYROIDAB in the last 72 hours. Anemia Panel: No results for input(s): VITAMINB12, FOLATE, FERRITIN, TIBC, IRON, RETICCTPCT in the last 72 hours. Urine analysis:    Component Value Date/Time   COLORURINE YELLOW 01/21/2021 0100   APPEARANCEUR HAZY (A) 01/21/2021 0100   LABSPEC 1.010 01/21/2021 0100   PHURINE 8.0 01/21/2021 0100   GLUCOSEU >=500 (A) 01/21/2021 0100   HGBUR NEGATIVE 01/21/2021 0100   BILIRUBINUR NEGATIVE 01/21/2021 0100   KETONESUR 5 (A) 01/21/2021 0100   PROTEINUR 30 (A) 01/21/2021 0100   NITRITE POSITIVE (A) 01/21/2021 0100   LEUKOCYTESUR SMALL (A) 01/21/2021 0100   Sepsis Labs: @LABRCNTIP (procalcitonin:4,lacticidven:4) ) Recent Results (from the past 240 hour(s))  Resp Panel by RT-PCR (Flu A&B, Covid) Nasopharyngeal Swab     Status: None   Collection Time: 01/21/21  2:04 AM   Specimen: Nasopharyngeal Swab; Nasopharyngeal(NP) swabs in vial transport medium  Result Value Ref Range Status   SARS Coronavirus 2 by RT PCR NEGATIVE NEGATIVE Final    Comment: (NOTE) SARS-CoV-2 target nucleic acids are NOT DETECTED.  The SARS-CoV-2 RNA is generally detectable in upper respiratory specimens during the acute phase of infection. The lowest concentration of SARS-CoV-2 viral copies this assay can detect is 138 copies/mL. A negative result does not preclude SARS-Cov-2 infection and should not be used as the sole basis for treatment or other patient management decisions. A negative result may occur with  improper specimen collection/handling, submission of specimen other than nasopharyngeal swab, presence of viral mutation(s) within the areas targeted by this assay, and inadequate number of viral copies(<138 copies/mL). A negative result must be combined with clinical observations, patient history, and epidemiological information. The expected result is Negative.  Fact Sheet for Patients:   EntrepreneurPulse.com.au  Fact Sheet for Healthcare Providers:  IncredibleEmployment.be  This test is no t yet approved or cleared by the [place] FDA and  has been authorized for detection and/or diagnosis of SARS-CoV-2 by FDA under an Emergency Use Authorization (EUA). This EUA will remain  in effect (meaning this test can be used) for the duration of the COVID-19 declaration under Section 564(b)(1) of  the Act, 21 U.S.C.section 360bbb-3(b)(1), unless the authorization is terminated  or revoked sooner.       Influenza A by PCR NEGATIVE NEGATIVE Final   Influenza B by PCR NEGATIVE NEGATIVE Final    Comment: (NOTE) The Xpert Xpress SARS-CoV-2/FLU/RSV plus assay is intended as an aid in the diagnosis of influenza from Nasopharyngeal swab specimens and should not be used as a sole basis for treatment. Nasal washings and aspirates are unacceptable for Xpert Xpress SARS-CoV-2/FLU/RSV testing.  Fact Sheet for Patients: EntrepreneurPulse.com.au  Fact Sheet for Healthcare Providers: IncredibleEmployment.be  This test is not yet approved or cleared by the [place] FDA and has been authorized for detection and/or diagnosis of SARS-CoV-2 by FDA under an Emergency Use Authorization (EUA). This EUA will remain in effect (meaning this test can be used) for the duration of the COVID-19 declaration under Section 564(b)(1) of the Act, 21 U.S.C. section 360bbb-3(b)(1), unless the authorization is terminated or revoked.  Performed at [facility], [place] [address], [place], [place] [address]      Radiological Exams on Admission: DG Chest 2 View  Result Date: 01/20/2021 CLINICAL DATA:  Chest pain EXAM: CHEST - 2 VIEW COMPARISON:  06/13/2020 chest radiograph. FINDINGS: Stable cardiomediastinal silhouette with normal heart size. No pneumothorax. No pleural effusion. Lungs appear clear, with no  acute consolidative airspace disease and no pulmonary edema. IMPRESSION: No active cardiopulmonary disease. Electronically Signed   By: Ilona Sorrel M.D.   On: 01/20/2021 20:49    EKG: Independently reviewed. Sinus tachycardia (rate 106), chronic RBBB, LVH, QTc 497 ms.   Assessment/Plan   1. Chest pain  - Presents with chest pain, worse with exertion, better with NTG in ED  - HS troponin normal x2, no acute ischemic features on EKG, no acute CXR findings  - Control BP, continue Toprol and statin, request routine cardiology consultation    2. Hypertensive urgency  - SBP as high as 205 in ED   - Improved with NTG in ED, continue Toprol, lisinopril, and triamterene-HCTZ    3. UTI  - Reports 2-3 days of dysuria and suprapubic pain  - UA compatible with infection, will send for culture and treat with Rocephin for now   4. Chronic venous stasis with ulcers   - Chronic venous stasis with ulcerations noted  - History of secondary cellulitis but not acutely infected  - Reports she lost her compression socks and no longer follows with wound care  - Elevate legs, diligent skin care, resume compression    5. Type II DM  - A1c was 9.2% most recently in August 2020, serum glucose 324 in ED   - CBG checks and insulin for now    6. Hypokalemia  - Replace, repeat chem panel tomorrow     DVT prophylaxis: Lovenox  Code Status: Full  Level of Care: Level of care: Telemetry Family Communication: None present  Disposition Plan:  Patient is from: home  Anticipated d/c is to: Home  Anticipated d/c date is: 01/22/21  Patient currently: Pending control of pain and BP  Consults called: Message sent to cardiology scheduler with request for routine consult  Admission status: Observation     Vianne Bulls, MD [facility]  01/21/2021, 3:37 AM

## 2021-01-21 NOTE — ED Notes (Signed)
Patient was given her breakfast tray.

## 2021-01-21 NOTE — Progress Notes (Signed)
Progress Note:    Amanda Butler    TFT:[id number] DOB: 1956-03-24 DOA: 01/20/2021  PCP: Gaynelle Arabian, MD    Brief Narrative:   65 year old female with a past medical history of hypertension, type 2 diabetes mellitus, chronic venous stasis ulcers, chronic diastolic CHF, asthma, morbid obesity with BMI 40.  Patient presented with multiple complaints to the emergency department with chest pain, dysuria and lower abdominal pain.     Subjective:   Still reporting some intermittent chest pain.  Does not follow with wound care outpatient anymore.  States her chest pain is nonreproducible.  Cardiology has been consulted.  No fevers or chills.    Assessment and Plan:   Chest pain: Troponins are reassuring, EKG shows no acute ischemic changes.  Cardiology consulted.  They will obtain 2D-echocardiogram.  They will consider inpatient cardiac stress test versus outpatient stress test if the patient being unable to lie flat.  Hypertensive urgency: SBP as high as 205 in the ED.  Continue home Toprol, triamterene/HCTZ.  We will increase the patient's lisinopril to 20 mg daily.  UTI: Continue Rocephin.  Await urine culture.  Chronic venous stasis with ulcers: No longer follows with outpatient wound care.  Continue compression socks.  Elevate legs.  Follow-up outpatient with wound care.  Consult inpatient wound care nurse.  Hypokalemia: Repleted.  Start Klor-Con 10 mEq daily.  Poorly controlled diabetes mellitus: A1c 9.2.  She has an allergy to metformin.  Currently on Amaryl and repaglinide.  Will discontinue Amaryl.  Start glipizide XL 10 mg daily.   Other information:    DVT prophylaxis: Lovenox subcu Code Status: Full code Family Communication: No family present at bedside Disposition:   Status is: Inpatient  Remains inpatient appropriate because:Ongoing diagnostic testing needed not appropriate for outpatient work up  Dispo: The patient is from: Home               Anticipated d/c is to: Home              Patient currently is not medically stable to d/c.   Difficult to place patient No           Consultants:   Cardiology    Objective:    Vitals:   01/21/21 1400 01/21/21 1430 01/21/21 1523 01/21/21 1548  BP: (!) 123/48 128/60 (!) 141/64 (!) 152/67  Pulse: 76 67 69 79  Resp:   17 15  Temp:   98.1 F (36.7 C) 98.6 F (37 C)  TempSrc:   Oral   SpO2: 97% 96% 96% 96%  Weight:      Height:        Intake/Output Summary (Last 24 hours) at 01/21/2021 1700 Last data filed at 01/21/2021 0607 Gross per 24 hour  Intake 400 ml  Output --  Net 400 ml   Filed Weights   01/20/21 2001  Weight: 113.4 kg       Physical Exam:    General:  Well nourished, well developed, in no acute distress HEENT: normal Neck: no JVD Vascular: No carotid bruits; Distal pulses 2+ bilaterally Cardiac:  normal S1, S2; RRR; + systolic murmur 2/6 Lungs:  clear to auscultation bilaterally, no wheezing, rhonchi or rales  Abd: soft, nontender, no hepatomegaly  Ext: B LE edema with chronic skin changes and open sores on both legs Musculoskeletal:  No deformities, BUE and BLE strength normal and equal Skin: warm and dry  Neuro:  CNs 2-12 intact, no focal abnormalities noted Psych:  Normal affect     Data Reviewed:    I have personally reviewed following labs and imaging studies  CBC: Recent Labs  Lab 01/20/21 2030  WBC 8.5  HGB 13.2  HCT 39.6  MCV 83.4  PLT 932    Basic Metabolic Panel: Recent Labs  Lab 01/20/21 2030 01/21/21 0232  NA 140 137  K 3.4* 2.9*  CL 105 104  CO2 27 27  GLUCOSE 324* 201*  BUN 17 13  CREATININE 0.93 0.64  CALCIUM 9.3 8.7*  MG  --  1.8    GFR: Estimated Creatinine Clearance: 87.2 mL/min (by C-G formula based on SCr of 0.64 mg/dL).  Liver Function Tests: No results for input(s): AST, ALT, ALKPHOS, BILITOT, PROT, ALBUMIN in the last 168 hours.  CBG: Recent Labs  Lab 01/21/21 0815 01/21/21 1158   GLUCAP 214* 303*     Recent Results (from the past 240 hour(s))  Resp Panel by RT-PCR (Flu A&B, Covid) Nasopharyngeal Swab     Status: None   Collection Time: 01/21/21  2:04 AM   Specimen: Nasopharyngeal Swab; Nasopharyngeal(NP) swabs in vial transport medium  Result Value Ref Range Status   SARS Coronavirus 2 by RT PCR NEGATIVE NEGATIVE Final    Comment: (NOTE) SARS-CoV-2 target nucleic acids are NOT DETECTED.  The SARS-CoV-2 RNA is generally detectable in upper respiratory specimens during the acute phase of infection. The lowest concentration of SARS-CoV-2 viral copies this assay can detect is 138 copies/mL. A negative result does not preclude SARS-Cov-2 infection and should not be used as the sole basis for treatment or other patient management decisions. A negative result may occur with  improper specimen collection/handling, submission of specimen other than nasopharyngeal swab, presence of viral mutation(s) within the areas targeted by this assay, and inadequate number of viral copies(<138 copies/mL). A negative result must be combined with clinical observations, patient history, and epidemiological information. The expected result is Negative.  Fact Sheet for Patients:  EntrepreneurPulse.com.au  Fact Sheet for Healthcare Providers:  IncredibleEmployment.be  This test is no t yet approved or cleared by the [place] FDA and  has been authorized for detection and/or diagnosis of SARS-CoV-2 by FDA under an Emergency Use Authorization (EUA). This EUA will remain  in effect (meaning this test can be used) for the duration of the COVID-19 declaration under Section 564(b)(1) of the Act, 21 U.S.C.section 360bbb-3(b)(1), unless the authorization is terminated  or revoked sooner.       Influenza A by PCR NEGATIVE NEGATIVE Final   Influenza B by PCR NEGATIVE NEGATIVE Final    Comment: (NOTE) The Xpert Xpress SARS-CoV-2/FLU/RSV plus  assay is intended as an aid in the diagnosis of influenza from Nasopharyngeal swab specimens and should not be used as a sole basis for treatment. Nasal washings and aspirates are unacceptable for Xpert Xpress SARS-CoV-2/FLU/RSV testing.  Fact Sheet for Patients: EntrepreneurPulse.com.au  Fact Sheet for Healthcare Providers: IncredibleEmployment.be  This test is not yet approved or cleared by the [place] FDA and has been authorized for detection and/or diagnosis of SARS-CoV-2 by FDA under an Emergency Use Authorization (EUA). This EUA will remain in effect (meaning this test can be used) for the duration of the COVID-19 declaration under Section 564(b)(1) of the Act, 21 U.S.C. section 360bbb-3(b)(1), unless the authorization is terminated or revoked.  Performed at [facility], [place] [address] [place],  [address]          Radiology Studies:    DG Chest 2  View  Result Date: 01/20/2021 CLINICAL DATA:  Chest pain EXAM: CHEST - 2 VIEW COMPARISON:  06/13/2020 chest radiograph. FINDINGS: Stable cardiomediastinal silhouette with normal heart size. No pneumothorax. No pleural effusion. Lungs appear clear, with no acute consolidative airspace disease and no pulmonary edema. IMPRESSION: No active cardiopulmonary disease. Electronically Signed   By: Ilona Sorrel M.D.   On: 01/20/2021 20:49   ECHOCARDIOGRAM COMPLETE  Result Date: 01/21/2021    ECHOCARDIOGRAM REPORT   Patient Name:   Amanda Butler Date of Exam: 01/21/2021 Medical Rec #:  [id number]       Height:       66.0 in Accession #:    [id number]      Weight:       250.0 lb Date of Birth:  04/26/1955        BSA:          2.199 m Patient Age:    68 years        BP:           126/61 mmHg Patient Gender: F               HR:           65 bpm. Exam Location:  Inpatient Procedure: 2D Echo, Color Doppler, Cardiac Doppler and Intracardiac            Opacification Agent  Indications:    Chest Pain R07.9  History:        Patient has prior history of Echocardiogram examinations, most                 recent 09/15/2015. Arrythmias:RBBB; Risk Factors:Hypertension.                 Hypertensive urgency. Chest pain. Chronic venous stasis with                 ulcers.  Sonographer:    Darlina Sicilian RDCS Referring Phys: [id number] [place]  1. Left ventricular ejection fraction, by estimation, is 60 to 65%. The left ventricle has normal function. The left ventricle has no regional wall motion abnormalities. There is mild concentric left ventricular hypertrophy. Left ventricular diastolic parameters are indeterminate.  2. Right ventricular systolic function is normal. The right ventricular size is normal. Tricuspid regurgitation signal is inadequate for assessing PA pressure.  3. The mitral valve is grossly normal. Trivial mitral valve regurgitation. No evidence of mitral stenosis.  4. The aortic valve is grossly normal. Aortic valve regurgitation is not visualized. No aortic stenosis is present.  5. The inferior vena cava is normal in size with greater than 50% respiratory variability, suggesting right atrial pressure of 3 mmHg. Comparison(s): No significant change from prior study. Conclusion(s)/Recommendation(s): Otherwise normal echocardiogram, with minor abnormalities described in the report. FINDINGS  Left Ventricle: Left ventricular ejection fraction, by estimation, is 60 to 65%. The left ventricle has normal function. The left ventricle has no regional wall motion abnormalities. Definity contrast agent was given IV to delineate the left ventricular  endocardial borders. The left ventricular internal cavity size was normal in size. There is mild concentric left ventricular hypertrophy. Left ventricular diastolic parameters are indeterminate. Right Ventricle: The right ventricular size is normal. No increase in right ventricular wall thickness. Right ventricular systolic  function is normal. Tricuspid regurgitation signal is inadequate for assessing PA pressure. Left Atrium: Left atrial size was normal in size. Right Atrium: Right atrial size was normal in size. Pericardium: There is no evidence  of pericardial effusion. Presence of pericardial fat pad. Mitral Valve: The mitral valve is grossly normal. There is mild calcification of the mitral valve leaflet(s). Mild mitral annular calcification. Trivial mitral valve regurgitation. No evidence of mitral valve stenosis. Tricuspid Valve: The tricuspid valve is normal in structure. Tricuspid valve regurgitation is trivial. No evidence of tricuspid stenosis. Aortic Valve: The aortic valve is grossly normal. Aortic valve regurgitation is not visualized. No aortic stenosis is present. Pulmonic Valve: The pulmonic valve was not well visualized. Pulmonic valve regurgitation is not visualized. No evidence of pulmonic stenosis. Aorta: The aortic root, ascending aorta, aortic arch and descending aorta are all structurally normal, with no evidence of dilitation or obstruction. Venous: The inferior vena cava is normal in size with greater than 50% respiratory variability, suggesting right atrial pressure of 3 mmHg. IAS/Shunts: The atrial septum is grossly normal.  LEFT VENTRICLE PLAX 2D LVIDd:         5.20 cm      Diastology LVIDs:         2.90 cm      LV e' medial:    5.88 cm/s LV PW:         1.00 cm      LV E/e' medial:  11.5 LV IVS:        1.10 cm      LV e' lateral:   4.48 cm/s LVOT diam:     2.00 cm      LV E/e' lateral: 15.0 LV SV:         68 LV SV Index:   31 LVOT Area:     3.14 cm  LV Volumes (MOD) LV vol d, MOD A2C: 86.9 ml LV vol d, MOD A4C: 102.0 ml LV vol s, MOD A2C: 37.7 ml LV vol s, MOD A4C: 28.7 ml LV SV MOD A2C:     49.2 ml LV SV MOD A4C:     102.0 ml LV SV MOD BP:      63.7 ml RIGHT VENTRICLE RV S prime:     15.20 cm/s TAPSE (M-mode): 2.5 cm LEFT ATRIUM             Index        RIGHT ATRIUM           Index LA diam:        3.70 cm  1.68 cm/m   RA Area:     10.20 cm LA Vol (A2C):   36.0 ml 16.37 ml/m  RA Volume:   17.40 ml  7.91 ml/m LA Vol (A4C):   32.4 ml 14.74 ml/m LA Biplane Vol: 34.6 ml 15.74 ml/m  AORTIC VALVE LVOT Vmax:   110.00 cm/s LVOT Vmean:  76.500 cm/s LVOT VTI:    0.216 m  AORTA Ao Root diam: 3.20 cm MITRAL VALVE MV Area (PHT): 3.42 cm    SHUNTS MV Decel Time: 222 msec    Systemic VTI:  0.22 m MV E velocity: 67.30 cm/s  Systemic Diam: 2.00 cm MV A velocity: 91.50 cm/s MV E/A ratio:  0.74 Buford Dresser MD Electronically signed by Buford Dresser MD Signature Date/Time: 01/21/2021/4:25:05 PM    Final         Medications:    Scheduled Meds:  enoxaparin (LOVENOX) injection  40 mg Subcutaneous Q24H   insulin aspart  0-5 Units Subcutaneous QHS   insulin aspart  0-9 Units Subcutaneous TID WC   lisinopril  20 mg Oral Daily   metoprolol succinate  50 mg Oral  Daily   [START ON 01/22/2021] potassium chloride  10 mEq Oral Daily   rosuvastatin  20 mg Oral Daily   triamterene-hydrochlorothiazide  1 tablet Oral Daily   Continuous Infusions:  cefTRIAXone (ROCEPHIN)  IV Stopped (01/21/21 0336)       LOS: 0 days    Time spent: 35-minutes    Leslee Home, MD [facility]   To contact the attending provider between 7A-7P or the covering provider during after hours 7P-7A, please log into the web site [email or web address] and access using universal [place] password for that web site. If you do not have the password, please call the hospital operator.  01/21/2021, 5:00 PM

## 2021-01-21 NOTE — ED Notes (Signed)
Patient was given her lunch tray. 

## 2021-01-21 NOTE — Consult Note (Addendum)
Cardiology Consultation:   Patient ID: [name] MRN: [id number]; DOB: [birth year]  Admit date: 01/20/2021 Date of Consult: 01/21/2021  PCP:  Gaynelle Arabian, MD   [facility] Providers Cardiologist:  Sanda Klein, MD   Patient Profile:   Amanda Butler is a 65 y.o. female with a hx of chronic diastolic heart failure, HTN, lymphedema, obesity, and DM who is being seen 01/21/2021 for the evaluation of chest pain at the request of Dr. Rowe Pavy.  History of Present Illness:   Amanda Butler has been followed by Dr. Sallyanne Kuster for HFpEF, poorly controlled DM2, and hypertension. She has chronic lower extremity edema with ulcers. She has LVH from HTN. She has a history of sedentary lifestyle. She has a history of chest pain with negative workup. She has had stress testing that showed no ischemia in 2017 and 2019. Echo in 2017 showed LVEF 55-60% and grade 2 DD, nild LVH, mild MR, and mildly dilated left atrium.   She presented to [facility] 04/26/19 with questionable stroke-like symptoms. While waiting in the waiting area, symptoms resolved. She was unable to complete head imaging due to claustrophobia. EDP did not feel symptoms were consistent with CVA. She was seen by Dr. Sallyanne Kuster on 04/28/20. ER records were reviewed: CXR did not show signs of CHF. She has had three [place] to rule out DVT in the last few years for left leg swelling. Her glucose was uncontrolled at that time.   She presented to [facility] 06/13/20 with chest pain. CP resolved. She reported a contrast allergy and refused CTA to rule out dissection. She ruled out with negative CE x 2. Hypertensive urgency with SBP in the 200s. She was found to have a UTI and was treated.   She presented to [facility] 01/20/21 with chest pain that started at 3pm yeteray. CP was 8/10 with sudden onset with improvement after arriving to the ER. She also complained of possible UTI.   She was hypertensive on arrival with SBP 210.  HS troponin x 2 negative Hb 13.2 (15.1) UA  nitrite positive bacteria with glucose and protein A1c 9.9% (9.2%) K 2.9, Mg 1.8  Cardiology was consulted. During my interview, she describes exertional chest pain for the last month. She has been trying to clean out boxes form her house after her husband's death (8 years ago) and has had chest pain with activity. CP is associated with nausea, diaphoresis, and shortness of breath. CP generally resolves in less than 1 hr with rest, but can last up to 2 days. Yesterday, she experienced chest pain while resting, which is new for her. The chest pain was a pressure, like "a toddler sitting on my chest." There was no radiation, but did have SOB, diaphoresis and nausea. She was concerned because she usually does not get CP at rest. She has slept in a recline for 15 years because she gets phlegm and feels choked. She had has SOB for many years, but feels her breathing is at baseline. She eats a high sodium diet (canned soups, processed foods) because she can't stand to cook in the kitchen - if she stands too long she feels near-syncope. She denies frank syncope. She currently has no chest pain. Urinary symptoms began 2 days ago. She recounts several stories of not being able to get back in with her providers (PCP, heartcare, endocrinologist, wound clinic) due to missing appts and COVID. She states several of her doctors won't let her reschedule. She has had about 100 lb weight loss.  Past Medical History:  Diagnosis Date   Arthritis    "knees" (09/13/2015)   Asthma    Cellulitis    Cellulitis 11/17/2018   LEFT LEG    Chronic diastolic CHF (congestive heart failure) ([place])    a. 09/2015 Echo: EF 55-60%, mild LVH, gr2DD, no rwma, mild MR, mildly dil LA.    GERD (gastroesophageal reflux disease)    High cholesterol    Hypertension    Midsternal chest pain    a. 09/2015 MV: no ischemia/infarct, EF 61%; b. 09/2015 Low prob V:Q scan.   Morbid obesity ([place])    Obesity    Pneumonia ~ 2000   "mild case"   RBBB  (right bundle branch block) approx 2010   Dr Marisue Humble did ECG and told her   Type II diabetes mellitus ([place])     Past Surgical History:  Procedure Laterality Date   BUNIONECTOMY Left    COLONOSCOPY WITH PROPOFOL N/A 09/23/2016   Procedure: COLONOSCOPY WITH PROPOFOL;  Surgeon: Garlan Fair, MD;  Location: [facility] ENDOSCOPY;  Service: Endoscopy;  Laterality: N/A;   MULTIPLE TOOTH EXTRACTIONS  ~ [place] Right    Leg   TUBAL LIGATION  ~ 1983     Home Medications:  Prior to Admission medications   Medication Sig Start Date End Date Taking? Authorizing Provider  acetaminophen (TYLENOL) 325 MG tablet Take 650 mg by mouth every 6 (six) hours as needed for moderate pain.   Yes [provider]  glimepiride (AMARYL) 4 MG tablet Take 1 tablet (4 mg total) by mouth every morning. 11/19/18 01/21/21 Yes Black, Lezlie Octave, NP  lisinopril (ZESTRIL) 10 MG tablet Take 10 mg by mouth daily. 06/09/19  Yes [provider]  metoprolol succinate (TOPROL-XL) 50 MG 24 hr tablet Take 50 mg by mouth daily. Take with or immediately following a meal.   Yes [provider]  repaglinide (PRANDIN) 1 MG tablet Take 1 mg by mouth See admin instructions. Take 1 tablet 15 to 30 mins before meals once a day for diabetes 08/19/18  Yes [provider]  rosuvastatin (CRESTOR) 20 MG tablet Take 20 mg by mouth daily.   Yes [provider]  Skin Protectants, Misc. (EUCERIN) cream Apply 1 application topically as needed for dry skin.   Yes [provider]  triamterene-hydrochlorothiazide (MAXZIDE) 75-50 MG per tablet Take 1 tablet by mouth daily.   Yes [provider]  amoxicillin-clavulanate (AUGMENTIN) 875-125 MG tablet Take 1 tablet by mouth 2 (two) times daily. One po bid x 7 days Patient not taking: Reported on 01/21/2021 06/14/20   Daleen Bo, MD  amoxicillin-clavulanate (AUGMENTIN) 875-125 MG tablet TAKE 1 TABLET BY MOUTH TWO TIMES DAILY.X 7 DAYS Patient  not taking: No sig reported 06/14/20 06/14/21  Daleen Bo, MD  mometasone (NASONEX) 50 MCG/ACT nasal spray Place 2 sprays into the nose daily as needed (congestion).     [provider]    Inpatient Medications: Scheduled Meds:  enoxaparin (LOVENOX) injection  40 mg Subcutaneous Q24H   insulin aspart  0-5 Units Subcutaneous QHS   insulin aspart  0-9 Units Subcutaneous TID WC   lisinopril  20 mg Oral Daily   metoprolol succinate  50 mg Oral Daily   [START ON 01/22/2021] potassium chloride  10 mEq Oral Daily   rosuvastatin  20 mg Oral Daily   triamterene-hydrochlorothiazide  1 tablet Oral Daily   Continuous Infusions:  cefTRIAXone (ROCEPHIN)  IV Stopped (01/21/21 0336)   PRN  Meds: acetaminophen, ondansetron (ZOFRAN) IV  Allergies:    Allergies  Allergen Reactions   Bee Venom Anaphylaxis   Iodine Shortness Of Breath and Rash   Shellfish Allergy Anaphylaxis   Actos [Pioglitazone] Other (See Comments)    Erratic heartbeat   Aspirin Hives   Celecoxib     Other reaction(s): Unknown   Latex Hives   Metformin And Related Other (See Comments)    Patient reports flu-like symptoms and fatigue   Morphine And Related Other (See Comments)    Childhood allergy (65 years old) , pt got sicker , temp went up, dr gave her the wrong medication   Nsaids Hives   Other     Xray dye   Ciprofloxacin Palpitations    Joint pain    Social History:   Social History   Socioeconomic History   Marital status: Widowed    Spouse name: Not on file   Number of children: Not on file   Years of education: Not on file   Highest education level: Not on file  Occupational History   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   Social Determinants of Health   Financial Resource Strain: Not on file  Food Insecurity: Not on file  Transportation Needs: Not on file  Physical Activity: Not on file  Stress: Not on file  Social Connections: Not on file  Intimate Partner Violence: Not on file    Family History:    Family History  Problem Relation Age of Onset   Diabetes Mother    Hypertension Mother    Diabetes Father    Hypertension Father    Heart attack Father 62   Diabetes Maternal Aunt    Diabetes Maternal Grandmother    Hypertension Maternal Grandmother    Cancer Maternal Grandfather    Diabetes Maternal Grandfather    Hypertension Maternal Grandfather    Asthma Paternal Grandmother    Diabetes Paternal Grandmother    Hypertension Paternal Grandmother    Diabetes Paternal Grandfather    Hypertension Paternal Grandfather    Heart failure Paternal Grandfather      ROS:  Please see the history of present illness.   All other ROS reviewed and negative.     Physical Exam/Data:   Vitals:   01/21/21 0530 01/21/21 0600 01/21/21 0816 01/21/21 0900  BP: (!) 144/59 139/61 (!) 136/58 134/61  Pulse: 76 77 76 70  Resp: 18 17 17 17   Temp:      SpO2: 94% 94% 94% 96%  Weight:      Height:        Intake/Output Summary (Last 24 hours) at 01/21/2021 1104 Last data filed at 01/21/2021 0607 Gross per 24 hour  Intake 400 ml  Output --  Net 400 ml   Last 3 Weights 01/20/2021 06/14/2020 04/29/2019  Weight (lbs) 250 lb 240 lb 252 lb  Weight (kg) 113.399 kg 108.863 kg 114.306 kg     Body mass index is 40.35 kg/m.  General:  Well nourished, well developed, in no acute distress HEENT: normal Neck: no JVD Vascular: No carotid bruits; Distal pulses  2+ bilaterally Cardiac:  normal S1, S2; RRR; + systolic murmur 2/6 Lungs:  clear to auscultation bilaterally, no wheezing, rhonchi or rales  Abd: soft, nontender, no hepatomegaly  Ext: B  LE edema with chronic skin changes and open sores on both legs Musculoskeletal:  No deformities, BUE and BLE strength normal and equal Skin: warm and dry  Neuro:  CNs 2-12 intact, no focal abnormalities noted Psych:  Normal affect   EKG:  The EKG was personally reviewed and demonstrates:  sinus tachycardia with HR 106 with RBBB (old) Telemetry:  Telemetry was personally reviewed and demonstrates:  N/A  Relevant CV Studies:  Nuclear stress test 2019: IMPRESSION: 1. No reversible ischemia. Possible small remote infarct/scar in the apical inferolateral wall.   2. Normal left ventricular wall motion.   3. Left ventricular ejection fraction 55%   4. Non invasive risk stratification*: Low  Laboratory Data:  High Sensitivity Troponin:   Recent Labs  Lab 01/20/21 2030 01/21/21 0106  TROPONINIHS 9 11     Chemistry Recent Labs  Lab 01/20/21 2030 01/21/21 0232  NA 140 137  K 3.4* 2.9*  CL 105 104  CO2 27 27  GLUCOSE 324* 201*  BUN 17 13  CREATININE 0.93 0.64  CALCIUM 9.3 8.7*  MG  --  1.8  GFRNONAA >60 >60  ANIONGAP 8 6    No results for input(s): PROT, ALBUMIN, AST, ALT, ALKPHOS, BILITOT in the last 168 hours. Lipids No results for input(s): CHOL, TRIG, HDL, LABVLDL, LDLCALC, CHOLHDL in the last 168 hours.  Hematology Recent Labs  Lab 01/20/21 2030  WBC 8.5  RBC 4.75  HGB 13.2  HCT 39.6  MCV 83.4  MCH 27.8  MCHC 33.3  RDW 14.2  PLT 263   Thyroid No results for input(s): TSH, FREET4 in the last 168 hours.  BNPNo results for input(s): BNP, PROBNP in the last 168 hours.  DDimer No results for input(s): DDIMER in the last 168 hours.   Radiology/Studies:  DG Chest 2 View  Result Date: 01/20/2021 CLINICAL DATA:  Chest pain EXAM: CHEST - 2 VIEW COMPARISON:  06/13/2020 chest radiograph. FINDINGS: Stable cardiomediastinal silhouette with normal heart size. No pneumothorax. No pleural effusion. Lungs appear clear, with no acute consolidative airspace disease and no  pulmonary edema. IMPRESSION: No active cardiopulmonary disease. Electronically Signed   By: Ilona Sorrel M.D.   On: 01/20/2021 20:49     Assessment and Plan:   Chest pain - negative enzymes - EKG does not appear ischemic - reassuring nuclear stress tests 2017 and 2019 - allergy to ASA - hives - her symptoms are concerning for angina - will obtain an echocardiogram to determine next steps - she has eaten today - she states she will not likely be able to stay supine for a nuclear stress test - may need to consider OP stress test with camera that can accommodate sitting up   Chronic diastolic heart failure - echo in 2017 with preserved EF, grade 2 DD - volume status difficult to assess given body habitus  - she is sedentary, difficult to evaluate symptoms - she reports chronic dyspnea, always sleeps sitting up, is not active (may walk around her house once per day) - CXR did not show signs of CHF, likely well compensated - will obtain echocardiogram - she eats a high sodium diet   Hypertensive urgency Hypertension - maintained on 10 mg lisinopril, 50 mg toprol, triamterene-HCTZ 75-50 mg daily  - presenting BP was 205/173, has improved  to 134/61 - continue present regimen   Obesity Sedentary  - question sleep apnea, although she has lost about 100 lbs - she sleeps sitting up, so difficult to evaluate symptoms   Hyperlipidemia - is on 20 mg crestor - will obtain fasting lipids and LFTs tomorrow morning   Chronic lower extremity edema - on maxide - appreciate wound clinic recs - question lymphedema - will obtain echo    Risk Assessment/Risk Scores:     HEAR Score (for undifferentiated chest pain):  HEAR Score: 7  New York Heart Association (NYHA) Functional Class NYHA Class II    For questions or updates, please contact [facility] Please consult [email or web address] for contact info under    Signed, Ledora Bottcher, PA  01/21/2021 11:04 AM  History and  all data above reviewed.  Patient examined.  I agree with the findings as above.   Patient presented with chest discomfort.  She lives alone.  She states she has been getting the discomfort when she has been trying to clean up some boxes and clutter in her house.  She describes it is midsternal.  It feels like it did in 2017 and 19 when she had stress testing.  She said sometimes it can come and go and sometimes it stays for couple days at a time.  She describes it as a heaviness somewhat moderate.  There may be some nausea she does not describe associated shortness of breath although she occasionally gets dyspneic with exertion but blames this on some chronic lung problems.  She is not describing new PND or orthopnea though she sleeps chronically on a couch.   There is no objective evidence of ischemia.  She has some chronic right bundle branch block changes but no enzyme elevations.  The patient exam reveals COR: Regular rate and rhythm,  Lungs: Clear to auscultation bilaterally,  Abd: Positive bowel sounds normal in frequency and pitch, Ext moderate lower extremity edema.  All available labs, radiology testing, previous records reviewed. Agree with documented assessment and plan.  Chest pain: Patient does have risk factors and family history in particular.  However, there is no objective evidence of ischemia.  She would not want a contrast study and would Find it difficult  to lie flat for any testing.  She will consent to an echocardiogram.  If this shows no significant wall motion abnormalities or change compared to previous I agree that outpatient stress testing in our [place] office would be reasonable and acceptable to the patient.  Jeneen Rinks Asahel Risden  11:48 AM  01/21/2021

## 2021-01-21 NOTE — ED Notes (Signed)
Assumed care of pt at 0700.  Pt resting comfortably at this time.  Lights dimmed per pt request for comfort to rest.

## 2021-01-22 DIAGNOSIS — R079 Chest pain, unspecified: Secondary | ICD-10-CM | POA: Diagnosis not present

## 2021-01-22 LAB — LIPID PANEL
Cholesterol: 144 mg/dL (ref 0–200)
HDL: 36 mg/dL — ABNORMAL LOW (ref >40)
Total CHOL/HDL Ratio: 4 RATIO
Triglycerides: 498 mg/dL — ABNORMAL HIGH (ref <150)
VLDL: UNDETERMINED mg/dL (ref 0–40)

## 2021-01-22 LAB — BASIC METABOLIC PANEL
Anion gap: 7 (ref 5–15)
BUN: 17 mg/dL (ref 8–23)
CO2: 28 mmol/L (ref 22–32)
Calcium: 9.1 mg/dL (ref 8.9–10.3)
Chloride: 101 mmol/L (ref 98–111)
Creatinine, Ser: 0.79 mg/dL (ref 0.44–1.00)
GFR, Estimated: >60 mL/min (ref >60)
Glucose, Bld: 225 mg/dL — ABNORMAL HIGH (ref 70–99)
Potassium: 3.6 mmol/L (ref 3.5–5.1)
Sodium: 136 mmol/L (ref 135–145)

## 2021-01-22 LAB — CBC
HCT: 36.7 % (ref 36.0–46.0)
Hemoglobin: 11.8 g/dL — ABNORMAL LOW (ref 12.0–15.0)
MCH: 27.7 pg (ref 26.0–34.0)
MCHC: 32.2 g/dL (ref 30.0–36.0)
MCV: 86.2 fL (ref 80.0–100.0)
Platelets: 221 10*3/uL (ref 150–400)
RBC: 4.26 MIL/uL (ref 3.87–5.11)
RDW: 14.3 % (ref 11.5–15.5)
WBC: 5.7 10*3/uL (ref 4.0–10.5)
nRBC: 0 % (ref 0.0–0.2)

## 2021-01-22 LAB — GLUCOSE, CAPILLARY
Glucose-Capillary: 218 mg/dL — ABNORMAL HIGH (ref 70–99)
Glucose-Capillary: 291 mg/dL — ABNORMAL HIGH (ref 70–99)
Glucose-Capillary: 239 mg/dL — ABNORMAL HIGH (ref 70–99)
Glucose-Capillary: 269 mg/dL — ABNORMAL HIGH (ref 70–99)

## 2021-01-22 LAB — MRSA NEXT GEN BY PCR, NASAL: MRSA by PCR Next Gen: DETECTED — AB

## 2021-01-22 MED ORDER — INSULIN DETEMIR 100 UNIT/ML ~~LOC~~ SOLN
10.0000 [IU] | Freq: Every day | SUBCUTANEOUS | Status: DC
  Administered 2021-01-22: 10 [IU] via SUBCUTANEOUS
  Filled 2021-01-22 (×2): qty 0.1

## 2021-01-22 MED ORDER — ROSUVASTATIN CALCIUM 20 MG PO TABS: 40.0000 mg | ORAL_TABLET | Freq: Every day | ORAL | Status: DC

## 2021-01-22 MED ORDER — MUPIROCIN 2 % EX OINT
TOPICAL_OINTMENT | Freq: Two times a day (BID) | CUTANEOUS | Status: DC
  Filled 2021-01-22: qty 22

## 2021-01-22 MED ORDER — ROSUVASTATIN CALCIUM 20 MG PO TABS
40.0000 mg | ORAL_TABLET | Freq: Every day | ORAL | Status: DC
  Administered 2021-01-23: 40 mg via ORAL
  Filled 2021-01-22: qty 2
  Filled 2021-01-22: qty 4

## 2021-01-22 MED ORDER — FENOFIBRATE 54 MG PO TABS
54.0000 mg | ORAL_TABLET | Freq: Every day | ORAL | Status: DC
  Administered 2021-01-22 – 2021-01-23 (×2): 54 mg via ORAL
  Filled 2021-01-22 (×2): qty 1

## 2021-01-22 MED ORDER — ROSUVASTATIN CALCIUM 10 MG PO TABS: 20.0000 mg | ORAL_TABLET | Freq: Every day | ORAL | Status: DC

## 2021-01-22 MED ORDER — ISOSORBIDE MONONITRATE ER 30 MG PO TB24
30.0000 mg | ORAL_TABLET | Freq: Every day | ORAL | Status: DC
  Administered 2021-01-22 – 2021-01-23 (×2): 30 mg via ORAL
  Filled 2021-01-22 (×2): qty 1

## 2021-01-22 MED ORDER — HYDROCERIN EX CREA
TOPICAL_CREAM | Freq: Every day | CUTANEOUS | Status: DC
  Filled 2021-01-22: qty 113

## 2021-01-22 NOTE — Progress Notes (Addendum)
Progress Note:    Amanda Butler    YJE:[id number] DOB: 29-Dec-1955 DOA: 01/20/2021  PCP: Gaynelle Arabian, MD    Brief Narrative:   65 year old female with a past medical history of hypertension, type 2 diabetes mellitus, chronic venous stasis ulcers/edema, chronic diastolic CHF, asthma, obesity.  Patient presented with multiple complaints to the emergency department with chest pain, dysuria and lower abdominal pain.     Subjective:   No acute events overnight. Seen and examined. No chest pain. No fevers or chills. BP much better this AM.    Assessment and Plan:   Chest pain: Troponins are reassuring, EKG shows no acute ischemic changes.  Cardiology consulted.  Echo showed preserved EF, mild LVH, no WMA and no significant valvular disease. Patient refused CTA and would not consent to nuclear stress test lying flat. Can schedule outpatient nuclear stress at [facility] with ability to sit up upon discharge. Cardiology started Imdur 30 mg nightly.  Hypertensive urgency: Resolved. Continue home Toprol, triamterene/HCTZ.  Her home lisinopril was increased to 20 mg daily during this hospitalization  UTI: Continue Rocephin.  Urine culture growing Proteus Mirabilis. S/S pending.  Chronic venous stasis/edema with ulcers: No longer follows with outpatient wound care.  Continue compression socks.  Elevate legs.  Follow-up outpatient with wound care will need referral. Refuses home health RN for wound care. Consulted inpatient wound care nurse and following recommendations  Dyslipidemia: Lipid panel shows elevated triglycerides 498 and unable to calculate LDL.  The patient's home Crestor was increased from 20 to 40 mg during this hospitalization.  Cardiology started the patient on fenofibrate.  Hypokalemia: Repleted.  Start Klor-Con 10 mEq daily during this hospitalization  Poorly controlled diabetes mellitus: A1c 9.2.  She has an allergy to metformin.  Currently on Amaryl and repaglinide at  home. Start Levemir 10 units QHS and continue SSI AC and HS. Has difficulty getting to [facility] to see her endocrinologist. Will need local ambulatory referral in [facility] for endocrinologist upon discharge.  Will need insulin teaching and diabetic supplies upon discharge.  Obesity: Counseled on lifestyle modifications including low-salt diet.   Other information:    DVT prophylaxis: Lovenox subcu Code Status: Full code Family Communication: No family present at bedside Disposition:   Status is: Inpatient  Remains inpatient appropriate because:Ongoing diagnostic testing needed not appropriate for outpatient work up  Dispo: The patient is from: Home              Anticipated d/c is to: Home              Patient currently is not medically stable to d/c.   Difficult to place patient No           Consultants:   Cardiology    Objective:    Vitals:   01/21/21 2308 01/22/21 0306 01/22/21 0826 01/22/21 1247  BP: 130/60 111/62 125/71 (!) 128/59  Pulse: 65 67 (!) 58 66  Resp: 20 20  12   Temp: 98.2 F (36.8 C) 97.7 F (36.5 C)  98.6 F (37 C)  TempSrc: Oral Oral  Oral  SpO2: 94% 100%  99%  Weight:      Height:        Intake/Output Summary (Last 24 hours) at 01/22/2021 1611 Last data filed at 01/22/2021 0600 Gross per 24 hour  Intake 111.61 ml  Output --  Net 111.61 ml    Filed Weights   01/20/21 2001 01/21/21 1548  Weight: 113.4 kg 96 kg  Physical Exam:    General:  Well nourished, well developed, in no acute distress HEENT: normal Neck: no JVD Vascular: No carotid bruits; Distal pulses 2+ bilaterally Cardiac:  normal S1, S2; RRR; + systolic murmur 2/6 Lungs:  clear to auscultation bilaterally, no wheezing, rhonchi or rales  Abd: soft, nontender, no hepatomegaly  Ext: BLE edema with chronic skin changes and open sores on both legs Musculoskeletal:  No deformities, BUE and BLE strength normal and equal Skin: warm and dry  Neuro:  CNs 2-12  intact, no focal abnormalities noted Psych:  Normal affect     Data Reviewed:    I have personally reviewed following labs and imaging studies  CBC: Recent Labs  Lab 01/20/21 2030 01/22/21 0428  WBC 8.5 5.7  HGB 13.2 11.8*  HCT 39.6 36.7  MCV 83.4 86.2  PLT 263 221     Basic Metabolic Panel: Recent Labs  Lab 01/20/21 2030 01/21/21 0232 01/22/21 0428  NA 140 137 136  K 3.4* 2.9* 3.6  CL 105 104 101  CO2 27 27 28   GLUCOSE 324* 201* 225*  BUN 17 13 17   CREATININE 0.93 0.64 0.79  CALCIUM 9.3 8.7* 9.1  MG  --  1.8  --      GFR: Estimated Creatinine Clearance: 79.7 mL/min (by C-G formula based on SCr of 0.79 mg/dL).  Liver Function Tests: No results for input(s): AST, ALT, ALKPHOS, BILITOT, PROT, ALBUMIN in the last 168 hours.  CBG: Recent Labs  Lab 01/21/21 1158 01/21/21 1707 01/21/21 1956 01/22/21 0753 01/22/21 1222  GLUCAP 303* 261* 253* 218* 291*      Recent Results (from the past 240 hour(s))  Urine Culture     Status: Abnormal (Preliminary result)   Collection Time: 01/21/21  1:00 AM   Specimen: Urine, Clean Catch  Result Value Ref Range Status   Specimen Description   Final    URINE, CLEAN CATCH Performed at [facility], [place] [address] [place], [place] [address]    Special Requests   Final    NONE Performed at [facility], [place] [address] [place], [place] [address]    Culture (A)  Final    >=100,000 COLONIES/mL PROTEUS MIRABILIS SUSCEPTIBILITIES TO FOLLOW Performed at [facility], [place] [address], [place], [place] [address]    Report Status PENDING  Incomplete  Resp Panel by RT-PCR (Flu A&B, Covid) Nasopharyngeal Swab     Status: None   Collection Time: 01/21/21  2:04 AM   Specimen: Nasopharyngeal Swab; Nasopharyngeal(NP) swabs in vial transport medium  Result Value Ref Range Status   SARS Coronavirus 2 by RT PCR NEGATIVE NEGATIVE Final    Comment: (NOTE) SARS-CoV-2 target nucleic  acids are NOT DETECTED.  The SARS-CoV-2 RNA is generally detectable in upper respiratory specimens during the acute phase of infection. The lowest concentration of SARS-CoV-2 viral copies this assay can detect is 138 copies/mL. A negative result does not preclude SARS-Cov-2 infection and should not be used as the sole basis for treatment or other patient management decisions. A negative result may occur with  improper specimen collection/handling, submission of specimen other than nasopharyngeal swab, presence of viral mutation(s) within the areas targeted by this assay, and inadequate number of viral copies(<138 copies/mL). A negative result must be combined with clinical observations, patient history, and epidemiological information. The expected result is Negative.  Fact Sheet for Patients:  EntrepreneurPulse.com.au  Fact Sheet for Healthcare Providers:  IncredibleEmployment.be  This test is no  t yet approved or cleared by the [place] and  has been authorized for detection and/or diagnosis of SARS-CoV-2 by FDA under an Emergency Use Authorization (EUA). This EUA will remain  in effect (meaning this test can be used) for the duration of the COVID-19 declaration under Section 564(b)(1) of the Act, 21 U.S.C.section 360bbb-3(b)(1), unless the authorization is terminated  or revoked sooner.       Influenza A by PCR NEGATIVE NEGATIVE Final   Influenza B by PCR NEGATIVE NEGATIVE Final    Comment: (NOTE) The Xpert Xpress SARS-CoV-2/FLU/RSV plus assay is intended as an aid in the diagnosis of influenza from Nasopharyngeal swab specimens and should not be used as a sole basis for treatment. Nasal washings and aspirates are unacceptable for Xpert Xpress SARS-CoV-2/FLU/RSV testing.  Fact Sheet for Patients: EntrepreneurPulse.com.au  Fact Sheet for Healthcare Providers: IncredibleEmployment.be  This  test is not yet approved or cleared by the [place] FDA and has been authorized for detection and/or diagnosis of SARS-CoV-2 by FDA under an Emergency Use Authorization (EUA). This EUA will remain in effect (meaning this test can be used) for the duration of the COVID-19 declaration under Section 564(b)(1) of the Act, 21 U.S.C. section 360bbb-3(b)(1), unless the authorization is terminated or revoked.  Performed at [facility], [place] [address], [place], [place] [address]           Radiology Studies:    DG Chest 2 View  Result Date: 01/20/2021 CLINICAL DATA:  Chest pain EXAM: CHEST - 2 VIEW COMPARISON:  06/13/2020 chest radiograph. FINDINGS: Stable cardiomediastinal silhouette with normal heart size. No pneumothorax. No pleural effusion. Lungs appear clear, with no acute consolidative airspace disease and no pulmonary edema. IMPRESSION: No active cardiopulmonary disease. Electronically Signed   By: Ilona Sorrel M.D.   On: 01/20/2021 20:49   ECHOCARDIOGRAM COMPLETE  Result Date: 01/21/2021    ECHOCARDIOGRAM REPORT   Patient Name:   Amanda Butler Date of Exam: 01/21/2021 Medical Rec #:  [id number]       Height:       66.0 in Accession #:    [id number]      Weight:       250.0 lb Date of Birth:  September 13, 1955        BSA:          2.199 m Patient Age:    57 years        BP:           126/61 mmHg Patient Gender: F               HR:           65 bpm. Exam Location:  Inpatient Procedure: 2D Echo, Color Doppler, Cardiac Doppler and Intracardiac            Opacification Agent Indications:    Chest Pain R07.9  History:        Patient has prior history of Echocardiogram examinations, most                 recent 09/15/2015. Arrythmias:RBBB; Risk Factors:Hypertension.                 Hypertensive urgency. Chest pain. Chronic venous stasis with                 ulcers.  Sonographer:    Darlina Sicilian RDCS Referring Phys: [id number] Mona  1. Left ventricular  ejection fraction, by estimation, is 60  to 65%. The left ventricle has normal function. The left ventricle has no regional wall motion abnormalities. There is mild concentric left ventricular hypertrophy. Left ventricular diastolic parameters are indeterminate.  2. Right ventricular systolic function is normal. The right ventricular size is normal. Tricuspid regurgitation signal is inadequate for assessing PA pressure.  3. The mitral valve is grossly normal. Trivial mitral valve regurgitation. No evidence of mitral stenosis.  4. The aortic valve is grossly normal. Aortic valve regurgitation is not visualized. No aortic stenosis is present.  5. The inferior vena cava is normal in size with greater than 50% respiratory variability, suggesting right atrial pressure of 3 mmHg. Comparison(s): No significant change from prior study. Conclusion(s)/Recommendation(s): Otherwise normal echocardiogram, with minor abnormalities described in the report. FINDINGS  Left Ventricle: Left ventricular ejection fraction, by estimation, is 60 to 65%. The left ventricle has normal function. The left ventricle has no regional wall motion abnormalities. Definity contrast agent was given IV to delineate the left ventricular  endocardial borders. The left ventricular internal cavity size was normal in size. There is mild concentric left ventricular hypertrophy. Left ventricular diastolic parameters are indeterminate. Right Ventricle: The right ventricular size is normal. No increase in right ventricular wall thickness. Right ventricular systolic function is normal. Tricuspid regurgitation signal is inadequate for assessing PA pressure. Left Atrium: Left atrial size was normal in size. Right Atrium: Right atrial size was normal in size. Pericardium: There is no evidence of pericardial effusion. Presence of pericardial fat pad. Mitral Valve: The mitral valve is grossly normal. There is mild calcification of the mitral valve leaflet(s). Mild  mitral annular calcification. Trivial mitral valve regurgitation. No evidence of mitral valve stenosis. Tricuspid Valve: The tricuspid valve is normal in structure. Tricuspid valve regurgitation is trivial. No evidence of tricuspid stenosis. Aortic Valve: The aortic valve is grossly normal. Aortic valve regurgitation is not visualized. No aortic stenosis is present. Pulmonic Valve: The pulmonic valve was not well visualized. Pulmonic valve regurgitation is not visualized. No evidence of pulmonic stenosis. Aorta: The aortic root, ascending aorta, aortic arch and descending aorta are all structurally normal, with no evidence of dilitation or obstruction. Venous: The inferior vena cava is normal in size with greater than 50% respiratory variability, suggesting right atrial pressure of 3 mmHg. IAS/Shunts: The atrial septum is grossly normal.  LEFT VENTRICLE PLAX 2D LVIDd:         5.20 cm      Diastology LVIDs:         2.90 cm      LV e' medial:    5.88 cm/s LV PW:         1.00 cm      LV E/e' medial:  11.5 LV IVS:        1.10 cm      LV e' lateral:   4.48 cm/s LVOT diam:     2.00 cm      LV E/e' lateral: 15.0 LV SV:         68 LV SV Index:   31 LVOT Area:     3.14 cm  LV Volumes (MOD) LV vol d, MOD A2C: 86.9 ml LV vol d, MOD A4C: 102.0 ml LV vol s, MOD A2C: 37.7 ml LV vol s, MOD A4C: 28.7 ml LV SV MOD A2C:     49.2 ml LV SV MOD A4C:     102.0 ml LV SV MOD BP:      63.7 ml RIGHT VENTRICLE RV S prime:  15.20 cm/s TAPSE (M-mode): 2.5 cm LEFT ATRIUM             Index        RIGHT ATRIUM           Index LA diam:        3.70 cm 1.68 cm/m   RA Area:     10.20 cm LA Vol (A2C):   36.0 ml 16.37 ml/m  RA Volume:   17.40 ml  7.91 ml/m LA Vol (A4C):   32.4 ml 14.74 ml/m LA Biplane Vol: 34.6 ml 15.74 ml/m  AORTIC VALVE LVOT Vmax:   110.00 cm/s LVOT Vmean:  76.500 cm/s LVOT VTI:    0.216 m  AORTA Ao Root diam: 3.20 cm MITRAL VALVE MV Area (PHT): 3.42 cm    SHUNTS MV Decel Time: 222 msec    Systemic VTI:  0.22 m MV E  velocity: 67.30 cm/s  Systemic Diam: 2.00 cm MV A velocity: 91.50 cm/s MV E/A ratio:  0.74 Buford Dresser MD Electronically signed by Buford Dresser MD Signature Date/Time: 01/21/2021/4:25:05 PM    Final         Medications:    Scheduled Meds:  enoxaparin (LOVENOX) injection  40 mg Subcutaneous Q24H   fenofibrate  54 mg Oral Daily   hydrocerin   Topical Daily   insulin aspart  0-5 Units Subcutaneous QHS   insulin aspart  0-9 Units Subcutaneous TID WC   insulin detemir  10 Units Subcutaneous QHS   isosorbide mononitrate  30 mg Oral Daily   lisinopril  20 mg Oral Daily   metoprolol succinate  50 mg Oral Daily   potassium chloride  10 mEq Oral Daily   [START ON 01/23/2021] rosuvastatin  40 mg Oral Daily   triamterene-hydrochlorothiazide  1 tablet Oral Daily   Continuous Infusions:  cefTRIAXone (ROCEPHIN)  IV 1 g (01/22/21 0244)       LOS: 1 day    Time spent: 35-minutes    Leslee Home, MD [facility]   To contact the attending provider between 7A-7P or the covering provider during after hours 7P-7A, please log into the web site [email or web address] and access using universal [place] password for that web site. If you do not have the password, please call the hospital operator.  01/22/2021, 4:11 PM

## 2021-01-22 NOTE — Progress Notes (Addendum)
Progress Note  Patient Name: Amanda Butler Date of Encounter: 01/22/2021  [facility] Cardiologist: Sanda Klein, MD   Subjective   No further chest pain. She is amenable to adding imdur and follow up in clinic.  Inpatient Medications    Scheduled Meds:  enoxaparin (LOVENOX) injection  40 mg Subcutaneous Q24H   glipiZIDE  10 mg Oral Q breakfast   hydrocerin   Topical Daily   insulin aspart  0-5 Units Subcutaneous QHS   insulin aspart  0-9 Units Subcutaneous TID WC   lisinopril  20 mg Oral Daily   metoprolol succinate  50 mg Oral Daily   potassium chloride  10 mEq Oral Daily   rosuvastatin  20 mg Oral Daily   triamterene-hydrochlorothiazide  1 tablet Oral Daily   Continuous Infusions:  cefTRIAXone (ROCEPHIN)  IV 1 g (01/22/21 0244)   PRN Meds: acetaminophen, ondansetron (ZOFRAN) IV   Vital Signs    Vitals:   01/21/21 1548 01/21/21 1950 01/21/21 2308 01/22/21 0306  BP: (!) 152/67 138/61 130/60 111/62  Pulse: 79 68 65 67  Resp: 15 20 20 20   Temp: 98.6 F (37 C) 98.3 F (36.8 C) 98.2 F (36.8 C) 97.7 F (36.5 C)  TempSrc:  Oral Oral Oral  SpO2: 96% 94% 94% 100%  Weight: 96 kg     Height: 5\' 6"  (1.676 m)       Intake/Output Summary (Last 24 hours) at 01/22/2021 0814 Last data filed at 01/22/2021 0600 Gross per 24 hour  Intake 111.61 ml  Output --  Net 111.61 ml   Last 3 Weights 01/21/2021 01/20/2021 06/14/2020  Weight (lbs) 211 lb 10.3 oz 250 lb 240 lb  Weight (kg) 96 kg 113.399 kg 108.863 kg      Telemetry    Sinus rhythm in the 60s - Personally Reviewed  ECG    No new tracings - Personally Reviewed  Physical Exam    GEN: No acute distress.   Neck: No JVD Cardiac: RRR, no murmurs, rubs, or gallops.  Respiratory: Clear to auscultation bilaterally. GI: Soft, nontender, non-distended  MS: + B LE edema with wounds, legs now wrapped Neuro:  Nonfocal  Psych: Normal affect   Labs    High Sensitivity Troponin:   Recent Labs  Lab  01/20/21 2030 01/21/21 0106  TROPONINIHS 9 11     Chemistry Recent Labs  Lab 01/20/21 2030 01/21/21 0232 01/22/21 0428  NA 140 137 136  K 3.4* 2.9* 3.6  CL 105 104 101  CO2 27 27 28   GLUCOSE 324* 201* 225*  BUN 17 13 17   CREATININE 0.93 0.64 0.79  CALCIUM 9.3 8.7* 9.1  MG  --  1.8  --   GFRNONAA >60 >60 >60  ANIONGAP 8 6 7     Lipids  Recent Labs  Lab 01/22/21 0428  CHOL 144  TRIG 498*  HDL 36*  LDLCALC NOT CALCULATED  CHOLHDL 4.0    Hematology Recent Labs  Lab 01/20/21 2030 01/22/21 0428  WBC 8.5 5.7  RBC 4.75 4.26  HGB 13.2 11.8*  HCT 39.6 36.7  MCV 83.4 86.2  MCH 27.8 27.7  MCHC 33.3 32.2  RDW 14.2 14.3  PLT 263 221   Thyroid No results for input(s): TSH, FREET4 in the last 168 hours.  BNPNo results for input(s): BNP, PROBNP in the last 168 hours.  DDimer No results for input(s): DDIMER in the last 168 hours.   Radiology    DG Chest 2 View  Result Date:  01/20/2021 CLINICAL DATA:  Chest pain EXAM: CHEST - 2 VIEW COMPARISON:  06/13/2020 chest radiograph. FINDINGS: Stable cardiomediastinal silhouette with normal heart size. No pneumothorax. No pleural effusion. Lungs appear clear, with no acute consolidative airspace disease and no pulmonary edema. IMPRESSION: No active cardiopulmonary disease. Electronically Signed   By: Ilona Sorrel M.D.   On: 01/20/2021 20:49   ECHOCARDIOGRAM COMPLETE  Result Date: 01/21/2021    ECHOCARDIOGRAM REPORT   Patient Name:   Amanda Butler Date of Exam: 01/21/2021 Medical Rec #:  [id number]       Height:       66.0 in Accession #:    [id number]      Weight:       250.0 lb Date of Birth:  1956/01/30        BSA:          2.199 m Patient Age:    65 years        BP:           126/61 mmHg Patient Gender: F               HR:           65 bpm. Exam Location:  Inpatient Procedure: 2D Echo, Color Doppler, Cardiac Doppler and Intracardiac            Opacification Agent Indications:    Chest Pain R07.9  History:        Patient has prior  history of Echocardiogram examinations, most                 recent 09/15/2015. Arrythmias:RBBB; Risk Factors:Hypertension.                 Hypertensive urgency. Chest pain. Chronic venous stasis with                 ulcers.  Sonographer:    Darlina Sicilian RDCS Referring Phys: [id number] Waves  1. Left ventricular ejection fraction, by estimation, is 60 to 65%. The left ventricle has normal function. The left ventricle has no regional wall motion abnormalities. There is mild concentric left ventricular hypertrophy. Left ventricular diastolic parameters are indeterminate.  2. Right ventricular systolic function is normal. The right ventricular size is normal. Tricuspid regurgitation signal is inadequate for assessing PA pressure.  3. The mitral valve is grossly normal. Trivial mitral valve regurgitation. No evidence of mitral stenosis.  4. The aortic valve is grossly normal. Aortic valve regurgitation is not visualized. No aortic stenosis is present.  5. The inferior vena cava is normal in size with greater than 50% respiratory variability, suggesting right atrial pressure of 3 mmHg. Comparison(s): No significant change from prior study. Conclusion(s)/Recommendation(s): Otherwise normal echocardiogram, with minor abnormalities described in the report. FINDINGS  Left Ventricle: Left ventricular ejection fraction, by estimation, is 60 to 65%. The left ventricle has normal function. The left ventricle has no regional wall motion abnormalities. Definity contrast agent was given IV to delineate the left ventricular  endocardial borders. The left ventricular internal cavity size was normal in size. There is mild concentric left ventricular hypertrophy. Left ventricular diastolic parameters are indeterminate. Right Ventricle: The right ventricular size is normal. No increase in right ventricular wall thickness. Right ventricular systolic function is normal. Tricuspid regurgitation signal is inadequate  for assessing PA pressure. Left Atrium: Left atrial size was normal in size. Right Atrium: Right atrial size was normal in size. Pericardium: There is no evidence of pericardial effusion. Presence  of pericardial fat pad. Mitral Valve: The mitral valve is grossly normal. There is mild calcification of the mitral valve leaflet(s). Mild mitral annular calcification. Trivial mitral valve regurgitation. No evidence of mitral valve stenosis. Tricuspid Valve: The tricuspid valve is normal in structure. Tricuspid valve regurgitation is trivial. No evidence of tricuspid stenosis. Aortic Valve: The aortic valve is grossly normal. Aortic valve regurgitation is not visualized. No aortic stenosis is present. Pulmonic Valve: The pulmonic valve was not well visualized. Pulmonic valve regurgitation is not visualized. No evidence of pulmonic stenosis. Aorta: The aortic root, ascending aorta, aortic arch and descending aorta are all structurally normal, with no evidence of dilitation or obstruction. Venous: The inferior vena cava is normal in size with greater than 50% respiratory variability, suggesting right atrial pressure of 3 mmHg. IAS/Shunts: The atrial septum is grossly normal.  LEFT VENTRICLE PLAX 2D LVIDd:         5.20 cm      Diastology LVIDs:         2.90 cm      LV e' medial:    5.88 cm/s LV PW:         1.00 cm      LV E/e' medial:  11.5 LV IVS:        1.10 cm      LV e' lateral:   4.48 cm/s LVOT diam:     2.00 cm      LV E/e' lateral: 15.0 LV SV:         68 LV SV Index:   31 LVOT Area:     3.14 cm  LV Volumes (MOD) LV vol d, MOD A2C: 86.9 ml LV vol d, MOD A4C: 102.0 ml LV vol s, MOD A2C: 37.7 ml LV vol s, MOD A4C: 28.7 ml LV SV MOD A2C:     49.2 ml LV SV MOD A4C:     102.0 ml LV SV MOD BP:      63.7 ml RIGHT VENTRICLE RV S prime:     15.20 cm/s TAPSE (M-mode): 2.5 cm LEFT ATRIUM             Index        RIGHT ATRIUM           Index LA diam:        3.70 cm 1.68 cm/m   RA Area:     10.20 cm LA Vol (A2C):   36.0 ml 16.37  ml/m  RA Volume:   17.40 ml  7.91 ml/m LA Vol (A4C):   32.4 ml 14.74 ml/m LA Biplane Vol: 34.6 ml 15.74 ml/m  AORTIC VALVE LVOT Vmax:   110.00 cm/s LVOT Vmean:  76.500 cm/s LVOT VTI:    0.216 m  AORTA Ao Root diam: 3.20 cm MITRAL VALVE MV Area (PHT): 3.42 cm    SHUNTS MV Decel Time: 222 msec    Systemic VTI:  0.22 m MV E velocity: 67.30 cm/s  Systemic Diam: 2.00 cm MV A velocity: 91.50 cm/s MV E/A ratio:  0.74 Buford Dresser MD Electronically signed by Buford Dresser MD Signature Date/Time: 01/21/2021/4:25:05 PM    Final     Cardiac Studies   Echo 01/21/21:  1. Left ventricular ejection fraction, by estimation, is 60 to 65%. The  left ventricle has normal function. The left ventricle has no regional  wall motion abnormalities. There is mild concentric left ventricular  hypertrophy. Left ventricular diastolic  parameters are indeterminate.   2. Right ventricular systolic function is  normal. The right ventricular  size is normal. Tricuspid regurgitation signal is inadequate for assessing  PA pressure.   3. The mitral valve is grossly normal. Trivial mitral valve  regurgitation. No evidence of mitral stenosis.   4. The aortic valve is grossly normal. Aortic valve regurgitation is not  visualized. No aortic stenosis is present.   5. The inferior vena cava is normal in size with greater than 50%  respiratory variability, suggesting right atrial pressure of 3 mmHg.   Patient Profile     65 y.o. female with a hx of chronic diastolic heart failure, HTN, lymphedema, obesity, and DM who is being seen 01/21/2021 for the evaluation of chest pain   Assessment & Plan    Chest pain - echocardiogram with preserved EF, mild LVH, no WMA and no significant valvular disease - she refuses CTA and will not consent to nuclear stress test that requires lying flat - CP similar to what she felt prior to 2017 and 2019 NM stress tests that were nonischemic - anti-anginals inlcude 50 mg toprol -  she does have BP room to add 30 mg imdur at night - consider outpatient nuclear stress test at [facility] with ability to sit up during testing   Chronic diastolic heart failure  - grade 2 DD in 2017 - volume status difficult to assess given body habitus - sedentary lifestyle makes evaluating symptoms difficult - CXR negative for CHF, likely well compensated - echo with preserved EF - she does eat a high sodium diet, discussed trying to  use less processed foods   Hypertensive urgency Hypertension - maintained on 10 mg lisinopril, 50 mg toprol, triamterene-HCTZ 75-50 mg daily - presenting BP was 205/173, now improved to the 644I systolic    DM 2 with hyperglycemia - A1c 9.9% - home meds include amaryl and prandin - may need to consider institution of insulin - will defer to primary - she indicated yesterday she was unable to get back into her endocrinologist - may need a new ambulatory referral    Hyperlipidemia with LDL goal < 70 01/22/2021: Cholesterol 144; HDL 36; LDL Cholesterol NOT CALCULATED; Triglycerides 498; VLDL UNABLE TO CALCULATE IF TRIGLYCERIDE OVER 400 mg/dL - attempt a lower LDL goal given uncontrolled DM and possible CAD - increase 20 mg crestor to 40 mg and add fenofibrate   Chronic lower extremity edema - question venous insufficiency vs lymphedema - open wounds - appreciate wound care recommendations - continue maxide   Obesity - sedentary, although has lost about 100 lbs - sleeps sitting up, so difficult to evaluate symptoms   Cardiology follow up has been arranged.      For questions or updates, please contact [place] Please consult [email or web address] for contact info under        Signed, Ledora Bottcher, PA  01/22/2021, 8:14 AM    History and all data above reviewed.  Patient examined.  I agree with the findings as above.  Twinges of chest pain but nothing sustained.    The patient exam reveals COR:RRR  ,  Lungs: Clear  ,  Abd: Positive  bowel sounds, no rebound no guarding, Ext No edema  .  All available labs, radiology testing, previous records reviewed. Agree with documented assessment and plan.   Chest pain:  Non anginal greater than anginal features.  Plan out patient Lexiscan Myoview.  Med changes as above. She needs better control of her BS for optimal triglyceride control.   Minus Breeding  2:34  PM  01/22/2021

## 2021-01-22 NOTE — Consult Note (Addendum)
[facility] Nurse Consult Note: Reason for Consult: bilateral LE wounds with ulcerations, left lateral. Anterior right foot and lateral LE with ulcerations. Poor hygiene to LEs, feet, inguinal and abdominal skin folds. Wound type:Venous insufficiency, lymphedema. Clinical CEAP 6 Pressure Injury POA: N/A Measurement:See Nursing Flow sheet Wound bed:Red, moist Drainage (amount, consistency, odor) Small serous Periwound: hemosiderin staining consistent with venous insufficiency, dry, caked exudate plus surface soiling on feet. Dressing procedure/placement/frequency:I have provided Nursing with guidance for the care of the integumentary system while in house to include routine turning and repositioning, placement of a sacral foam to prevent pressure injury, and care of the MASD affected areas of the skin folds using house antimicrobial wicking textile (InterDry) after cleansing. LE wounds will be dressed daily using a soap and water cleanse followed by placement of an antimicrobial nonadherent (xeroform). This will be topped with an ABD for comfort and secured with Kerlix roll gauze topped with ACE bandaging applied in a spiral fashion from just below toes to just below knees.  Prevalon pressure redistribution heel boots are to be applied to both prevent lateral rotation of the LEs and floatation of the heels to prevent pressure injury.  Patient will require follow up post discharge fo the care of her LEs, either by returning to the outpatient wound clinic where she has been seen in the past (2021) or by an [facility]. A referral to podiatric medicine is recommended for routine toenail debridement. If you agree, please Order/Arrange.  [place] nursing team will not follow, but will remain available to this patient, the nursing and medical teams.  Please re-consult if needed. Thanks, Maudie Flakes, MSN, RN, [place], Arther Abbott  Pager# [phone]

## 2021-01-23 DIAGNOSIS — I160 Hypertensive urgency: Secondary | ICD-10-CM | POA: Diagnosis not present

## 2021-01-23 DIAGNOSIS — I5032 Chronic diastolic (congestive) heart failure: Secondary | ICD-10-CM | POA: Diagnosis not present

## 2021-01-23 DIAGNOSIS — N390 Urinary tract infection, site not specified: Secondary | ICD-10-CM | POA: Diagnosis not present

## 2021-01-23 DIAGNOSIS — R079 Chest pain, unspecified: Secondary | ICD-10-CM | POA: Diagnosis not present

## 2021-01-23 LAB — GLUCOSE, CAPILLARY
Glucose-Capillary: 241 mg/dL — ABNORMAL HIGH (ref 70–99)
Glucose-Capillary: 257 mg/dL — ABNORMAL HIGH (ref 70–99)
Glucose-Capillary: 188 mg/dL — ABNORMAL HIGH (ref 70–99)

## 2021-01-23 LAB — BASIC METABOLIC PANEL
Anion gap: 8 (ref 5–15)
BUN: 25 mg/dL — ABNORMAL HIGH (ref 8–23)
CO2: 26 mmol/L (ref 22–32)
Calcium: 9.2 mg/dL (ref 8.9–10.3)
Chloride: 98 mmol/L (ref 98–111)
Creatinine, Ser: 0.93 mg/dL (ref 0.44–1.00)
GFR, Estimated: >60 mL/min (ref >60)
Glucose, Bld: 280 mg/dL — ABNORMAL HIGH (ref 70–99)
Potassium: 3.8 mmol/L (ref 3.5–5.1)
Sodium: 132 mmol/L — ABNORMAL LOW (ref 135–145)

## 2021-01-23 LAB — URINE CULTURE: Culture: >=100000 — AB

## 2021-01-23 LAB — CBC
HCT: 37.9 % (ref 36.0–46.0)
Hemoglobin: 12.4 g/dL (ref 12.0–15.0)
MCH: 28.1 pg (ref 26.0–34.0)
MCHC: 32.7 g/dL (ref 30.0–36.0)
MCV: 85.9 fL (ref 80.0–100.0)
Platelets: 275 10*3/uL (ref 150–400)
RBC: 4.41 MIL/uL (ref 3.87–5.11)
RDW: 14 % (ref 11.5–15.5)
WBC: 7.3 10*3/uL (ref 4.0–10.5)
nRBC: 0 % (ref 0.0–0.2)

## 2021-01-23 MED ORDER — ISOSORBIDE MONONITRATE ER 30 MG PO TB24: 30.0000 mg | ORAL_TABLET | Freq: Every day | ORAL | 0 refills | Status: DC

## 2021-01-23 MED ORDER — LISINOPRIL 20 MG PO TABS: 20.0000 mg | ORAL_TABLET | Freq: Every day | ORAL | 0 refills | Status: DC

## 2021-01-23 MED ORDER — ROSUVASTATIN CALCIUM 40 MG PO TABS: 40.0000 mg | ORAL_TABLET | Freq: Every day | ORAL | 2 refills | Status: DC

## 2021-01-23 MED ORDER — FENOFIBRATE 54 MG PO TABS: 54.0000 mg | ORAL_TABLET | Freq: Every day | ORAL | 2 refills | Status: AC

## 2021-01-23 NOTE — Discharge Summary (Signed)
Physician Discharge Summary  Amanda Butler HYI:[id number] DOB: 07-17-1955 DOA: 01/20/2021  PCP: Gaynelle Arabian, MD  Admit date: 01/20/2021 Discharge date: 01/23/2021  Admitted From: Home Disposition: Home  Recommendations for Outpatient Follow-up:  Follow up with PCP in 1 week Follow up with Cardiology in 2-4 weeks Consider outpatient referral for podiatry to manage toenails in setting of uncontrolled diabetes Needs better hemoglobin N8M control; complicated by arthritis history Please follow up on the following pending results: None  Home Health: None Equipment/Devices: None  Discharge Condition: Stable CODE STATUS: Full code Diet recommendation: Heart healthy/carb modified   Brief/Interim Summary:  Admission HPI written by Vianne Bulls, MD   HPI: Amanda Butler is a 65 y.o. female with medical history significant for hypertension, type 2 diabetes mellitus, chronic venous stasis with ulcers, chronic diastolic CHF, asthma, and BMI 40, now presenting to the emergency department for evaluation of chest pain, dysuria, and lower abdominal pain.  The patient reports that she had a heart attack 2 years ago and has continued to have chest discomfort when exerting herself with chores around the house since then.  She reports that this chest discomfort has worsened recently and has been constant since yesterday afternoon or evening.  Pain radiates from the central chest towards the left, sometimes associated with nausea and diaphoresis, and usually better with rest but not last night.  She also complains of 2 to 3 days of dysuria with suprapubic pain.   Hospital course:  Chest pain Cardiology consulted. EKG without acute ischemic changes and high sensitivity troponin trend of 9 > 11. Transthoracic Echocardiogram significant for mild LVH and no WMA with preserved EF. Cardiology recommended CTA and nuclear stress test for which the patient declined. Recommendation for outpatient  follow-up with outpatient nuclear stress test. Imdur started as mentioned below.  Hypertensive urgency Patient discharged on new regimen of Toprol XL 50 mg daily, lisinopril 20 mg daily, Imdur 30 mg daily, Maxide 75-50mg  daily  Chronic diastolic heart failure Appears to be euvolemic.  UTI Urine culture significant for Proteus Mirabilis. Patient completed three days of Ceftriaxone IV.  Chronic venous stasis changes LE edema with chronic ulcers Wound care consulted with recommendations for dressing changes and wound clinic follow-up.  Hyperlipidemia Increased to Crestor 40 mg daily  Hypokalemia Resolved with repletion.  Diabetes mellitus, type 2 Patient is on Amaryl and Prandin as an outpatient. Attempted to start patient on insulin, but she is unable to provide insulin injections as an outpatient secondary to arthritis. Attempted to prescribe SGLT-2 medication, however she has a significant copay of >$500 per month. She also cannot manage a DPP-4 secondary to need to manipulate a needle. Patient has no help at home. Will need to have close follow-up with PCP and possibly focus more on lifestyle changes to help improve disease.  Obesity Body mass index is 34.16 kg/m.  Discharge Diagnoses:  Principal Problem:   Chest pain Active Problems:   Stasis dermatitis of both legs   Type II diabetes mellitus (HCC)   Chronic diastolic CHF (congestive heart failure) (HCC)   Acute lower UTI   Hypokalemia   Hypertensive urgency    Discharge Instructions   Allergies as of 01/23/2021       Reactions   Bee Venom Anaphylaxis   Iodine Shortness Of Breath, Rash   Shellfish Allergy Anaphylaxis   Actos [pioglitazone] Other (See Comments)   Erratic heartbeat   Aspirin Hives   Celecoxib    Other reaction(s): Unknown  Latex Hives   Metformin And Related Other (See Comments)   Patient reports flu-like symptoms and fatigue   Morphine And Related Other (See Comments)   Childhood allergy  (65 years old) , pt got sicker , temp went up, dr gave her the wrong medication   Nsaids Hives   Other    Xray dye   Ciprofloxacin Palpitations   Joint pain        Medication List     STOP taking these medications    amoxicillin-clavulanate 875-125 MG tablet Commonly known as: AUGMENTIN       TAKE these medications    acetaminophen 325 MG tablet Commonly known as: TYLENOL Take 650 mg by mouth every 6 (six) hours as needed for moderate pain.   eucerin cream Apply 1 application topically as needed for dry skin.   fenofibrate 54 MG tablet Take 1 tablet (54 mg total) by mouth daily. Start taking on: January 24, 2021   glimepiride 4 MG tablet Commonly known as: Amaryl Take 1 tablet (4 mg total) by mouth every morning.   isosorbide mononitrate 30 MG 24 hr tablet Commonly known as: IMDUR Take 1 tablet (30 mg total) by mouth daily. Start taking on: January 24, 2021   lisinopril 20 MG tablet Commonly known as: ZESTRIL Take 1 tablet (20 mg total) by mouth daily. What changed:  medication strength how much to take   metoprolol succinate 50 MG 24 hr tablet Commonly known as: TOPROL-XL Take 50 mg by mouth daily. Take with or immediately following a meal.   mometasone 50 MCG/ACT nasal spray Commonly known as: NASONEX Place 2 sprays into the nose daily as needed (congestion).   repaglinide 1 MG tablet Commonly known as: PRANDIN Take 1 mg by mouth See admin instructions. Take 1 tablet 15 to 30 mins before meals once a day for diabetes   rosuvastatin 40 MG tablet Commonly known as: CRESTOR Take 1 tablet (40 mg total) by mouth daily. What changed:  medication strength how much to take   triamterene-hydrochlorothiazide 75-50 MG tablet Commonly known as: MAXZIDE Take 1 tablet by mouth daily.        Follow-up Information     [facility]             . Go on 03/20/2021.   Why: Wednesday at [place] information: [place]. [place] [phone]        Gaynelle Arabian, MD. Schedule an appointment as soon as possible for a visit.   Specialty: Family Medicine Why: For hospital follow-up Contact information: [address] [place] [address] [phone]         Croitoru, Dani Gobble, MD Follow up in 1 week(s).   Specialty: Cardiology Why: For hospital follow-up Contact information: [address] [place] [address] [phone]                Allergies  Allergen Reactions   Bee Venom Anaphylaxis   Iodine Shortness Of Breath and Rash   Shellfish Allergy Anaphylaxis   Actos [Pioglitazone] Other (See Comments)    Erratic heartbeat   Aspirin Hives   Celecoxib     Other reaction(s): Unknown   Latex Hives   Metformin And Related Other (See Comments)    Patient reports flu-like symptoms and fatigue   Morphine And Related Other (See Comments)    Childhood allergy (65 years old) , pt got sicker , temp  went up, dr gave her the wrong medication   Nsaids Hives   Other     Xray dye   Ciprofloxacin Palpitations    Joint pain    Consultations: Cardiology   Procedures/Studies: DG Chest 2 View  Result Date: 01/20/2021 CLINICAL DATA:  Chest pain EXAM: CHEST - 2 VIEW COMPARISON:  06/13/2020 chest radiograph. FINDINGS: Stable cardiomediastinal silhouette with normal heart size. No pneumothorax. No pleural effusion. Lungs appear clear, with no acute consolidative airspace disease and no pulmonary edema. IMPRESSION: No active cardiopulmonary disease. Electronically Signed   By: Ilona Sorrel M.D.   On: 01/20/2021 20:49   ECHOCARDIOGRAM COMPLETE  Result Date: 01/21/2021    ECHOCARDIOGRAM REPORT   Patient Name:   CORYN MOSSO Date of Exam: 01/21/2021 Medical Rec #:  [id number]       Height:       66.0 in Accession #:    [id number]      Weight:       250.0 lb Date of Birth:  March 30, 1956        BSA:          2.199 m Patient Age:     50 years        BP:           126/61 mmHg Patient Gender: F               HR:           65 bpm. Exam Location:  Inpatient Procedure: 2D Echo, Color Doppler, Cardiac Doppler and Intracardiac            Opacification Agent Indications:    Chest Pain R07.9  History:        Patient has prior history of Echocardiogram examinations, most                 recent 09/15/2015. Arrythmias:RBBB; Risk Factors:Hypertension.                 Hypertensive urgency. Chest pain. Chronic venous stasis with                 ulcers.  Sonographer:    Darlina Sicilian RDCS Referring Phys: [id number] [place]  1. Left ventricular ejection fraction, by estimation, is 60 to 65%. The left ventricle has normal function. The left ventricle has no regional wall motion abnormalities. There is mild concentric left ventricular hypertrophy. Left ventricular diastolic parameters are indeterminate.  2. Right ventricular systolic function is normal. The right ventricular size is normal. Tricuspid regurgitation signal is inadequate for assessing PA pressure.  3. The mitral valve is grossly normal. Trivial mitral valve regurgitation. No evidence of mitral stenosis.  4. The aortic valve is grossly normal. Aortic valve regurgitation is not visualized. No aortic stenosis is present.  5. The inferior vena cava is normal in size with greater than 50% respiratory variability, suggesting right atrial pressure of 3 mmHg. Comparison(s): No significant change from prior study. Conclusion(s)/Recommendation(s): Otherwise normal echocardiogram, with minor abnormalities described in the report. FINDINGS  Left Ventricle: Left ventricular ejection fraction, by estimation, is 60 to 65%. The left ventricle has normal function. The left ventricle has no regional wall motion abnormalities. Definity contrast agent was given IV to delineate the left ventricular  endocardial borders. The left ventricular internal cavity size was normal in size. There is mild  concentric left ventricular hypertrophy. Left ventricular diastolic parameters are indeterminate. Right Ventricle: The right ventricular size is normal. No increase in right  ventricular wall thickness. Right ventricular systolic function is normal. Tricuspid regurgitation signal is inadequate for assessing PA pressure. Left Atrium: Left atrial size was normal in size. Right Atrium: Right atrial size was normal in size. Pericardium: There is no evidence of pericardial effusion. Presence of pericardial fat pad. Mitral Valve: The mitral valve is grossly normal. There is mild calcification of the mitral valve leaflet(s). Mild mitral annular calcification. Trivial mitral valve regurgitation. No evidence of mitral valve stenosis. Tricuspid Valve: The tricuspid valve is normal in structure. Tricuspid valve regurgitation is trivial. No evidence of tricuspid stenosis. Aortic Valve: The aortic valve is grossly normal. Aortic valve regurgitation is not visualized. No aortic stenosis is present. Pulmonic Valve: The pulmonic valve was not well visualized. Pulmonic valve regurgitation is not visualized. No evidence of pulmonic stenosis. Aorta: The aortic root, ascending aorta, aortic arch and descending aorta are all structurally normal, with no evidence of dilitation or obstruction. Venous: The inferior vena cava is normal in size with greater than 50% respiratory variability, suggesting right atrial pressure of 3 mmHg. IAS/Shunts: The atrial septum is grossly normal.  LEFT VENTRICLE PLAX 2D LVIDd:         5.20 cm      Diastology LVIDs:         2.90 cm      LV e' medial:    5.88 cm/s LV PW:         1.00 cm      LV E/e' medial:  11.5 LV IVS:        1.10 cm      LV e' lateral:   4.48 cm/s LVOT diam:     2.00 cm      LV E/e' lateral: 15.0 LV SV:         68 LV SV Index:   31 LVOT Area:     3.14 cm  LV Volumes (MOD) LV vol d, MOD A2C: 86.9 ml LV vol d, MOD A4C: 102.0 ml LV vol s, MOD A2C: 37.7 ml LV vol s, MOD A4C: 28.7 ml LV SV MOD  A2C:     49.2 ml LV SV MOD A4C:     102.0 ml LV SV MOD BP:      63.7 ml RIGHT VENTRICLE RV S prime:     15.20 cm/s TAPSE (M-mode): 2.5 cm LEFT ATRIUM             Index        RIGHT ATRIUM           Index LA diam:        3.70 cm 1.68 cm/m   RA Area:     10.20 cm LA Vol (A2C):   36.0 ml 16.37 ml/m  RA Volume:   17.40 ml  7.91 ml/m LA Vol (A4C):   32.4 ml 14.74 ml/m LA Biplane Vol: 34.6 ml 15.74 ml/m  AORTIC VALVE LVOT Vmax:   110.00 cm/s LVOT Vmean:  76.500 cm/s LVOT VTI:    0.216 m  AORTA Ao Root diam: 3.20 cm MITRAL VALVE MV Area (PHT): 3.42 cm    SHUNTS MV Decel Time: 222 msec    Systemic VTI:  0.22 m MV E velocity: 67.30 cm/s  Systemic Diam: 2.00 cm MV A velocity: 91.50 cm/s MV E/A ratio:  0.74 Buford Dresser MD Electronically signed by Buford Dresser MD Signature Date/Time: 01/21/2021/4:25:05 PM    Final       Subjective: No chest pain  Discharge Exam: Vitals:   01/23/21  0405 01/23/21 1448  BP: (!) 112/57 (!) 99/57  Pulse: 65 (!) 59  Resp: 20 16  Temp: 98.2 F (36.8 C) 97.7 F (36.5 C)  SpO2: 98% 98%   Vitals:   01/22/21 1247 01/22/21 2005 01/23/21 0405 01/23/21 1448  BP: (!) 128/59 (!) 141/70 (!) 112/57 (!) 99/57  Pulse: 66 72 65 (!) 59  Resp: 12 20 20 16   Temp: 98.6 F (37 C) 98.4 F (36.9 C) 98.2 F (36.8 C) 97.7 F (36.5 C)  TempSrc: Oral Oral Oral Oral  SpO2: 99% 98% 98% 98%  Weight:      Height:        General: Pt is alert, awake, not in acute distress Cardiovascular: RRR, S1/S2 +, no rubs, no gallops Respiratory: CTA bilaterally, no wheezing, no rhonchi Abdominal: Soft, NT, ND, bowel sounds + Extremities: Legs wrapped in gauze.    The results of significant diagnostics from this hospitalization (including imaging, microbiology, ancillary and laboratory) are listed below for reference.     Microbiology: Recent Results (from the past 240 hour(s))  Urine Culture     Status: Abnormal   Collection Time: 01/21/21  1:00 AM   Specimen: Urine,  Clean Catch  Result Value Ref Range Status   Specimen Description   Final    URINE, CLEAN CATCH Performed at [facility], [place] [address] [place], [place] [address]    Special Requests   Final    NONE Performed at [facility], [place] [address], [place], [place] [address]    Culture >=100,000 COLONIES/mL PROTEUS MIRABILIS (A)  Final   Report Status 01/23/2021 FINAL  Final   Organism ID, Bacteria PROTEUS MIRABILIS (A)  Final      Susceptibility   Proteus mirabilis - MIC*    AMPICILLIN <=2 SENSITIVE Sensitive     CEFAZOLIN <=4 SENSITIVE Sensitive     CEFEPIME <=0.12 SENSITIVE Sensitive     CEFTRIAXONE <=0.25 SENSITIVE Sensitive     CIPROFLOXACIN <=0.25 SENSITIVE Sensitive     GENTAMICIN <=1 SENSITIVE Sensitive     IMIPENEM 1 SENSITIVE Sensitive     NITROFURANTOIN 128 RESISTANT Resistant     TRIMETH/SULFA >=320 RESISTANT Resistant     AMPICILLIN/SULBACTAM <=2 SENSITIVE Sensitive     PIP/TAZO <=4 SENSITIVE Sensitive     * >=100,000 COLONIES/mL PROTEUS MIRABILIS  Resp Panel by RT-PCR (Flu A&B, Covid) Nasopharyngeal Swab     Status: None   Collection Time: 01/21/21  2:04 AM   Specimen: Nasopharyngeal Swab; Nasopharyngeal(NP) swabs in vial transport medium  Result Value Ref Range Status   SARS Coronavirus 2 by RT PCR NEGATIVE NEGATIVE Final    Comment: (NOTE) SARS-CoV-2 target nucleic acids are NOT DETECTED.  The SARS-CoV-2 RNA is generally detectable in upper respiratory specimens during the acute phase of infection. The lowest concentration of SARS-CoV-2 viral copies this assay can detect is 138 copies/mL. A negative result does not preclude SARS-Cov-2 infection and should not be used as the sole basis for treatment or other patient management decisions. A negative result may occur with  improper specimen collection/handling, submission of specimen other than nasopharyngeal swab, presence of viral mutation(s) within the areas targeted by this  assay, and inadequate number of viral copies(<138 copies/mL). A negative result must be combined with clinical observations, patient history, and epidemiological information. The expected result is Negative.  Fact Sheet for Patients:  EntrepreneurPulse.com.au  Fact Sheet for Healthcare Providers:  IncredibleEmployment.be  This test is no t yet approved or cleared  by the [place] and  has been authorized for detection and/or diagnosis of SARS-CoV-2 by FDA under an Emergency Use Authorization (EUA). This EUA will remain  in effect (meaning this test can be used) for the duration of the COVID-19 declaration under Section 564(b)(1) of the Act, 21 U.S.C.section 360bbb-3(b)(1), unless the authorization is terminated  or revoked sooner.       Influenza A by PCR NEGATIVE NEGATIVE Final   Influenza B by PCR NEGATIVE NEGATIVE Final    Comment: (NOTE) The Xpert Xpress SARS-CoV-2/FLU/RSV plus assay is intended as an aid in the diagnosis of influenza from Nasopharyngeal swab specimens and should not be used as a sole basis for treatment. Nasal washings and aspirates are unacceptable for Xpert Xpress SARS-CoV-2/FLU/RSV testing.  Fact Sheet for Patients: EntrepreneurPulse.com.au  Fact Sheet for Healthcare Providers: IncredibleEmployment.be  This test is not yet approved or cleared by the [place] FDA and has been authorized for detection and/or diagnosis of SARS-CoV-2 by FDA under an Emergency Use Authorization (EUA). This EUA will remain in effect (meaning this test can be used) for the duration of the COVID-19 declaration under Section 564(b)(1) of the Act, 21 U.S.C. section 360bbb-3(b)(1), unless the authorization is terminated or revoked.  Performed at [facility], [place] [address], [place], [place] [address]   MRSA Next Gen by PCR, Nasal     Status: Abnormal   Collection Time:  01/22/21  3:51 PM   Specimen: Nasal Mucosa; Nasal Swab  Result Value Ref Range Status   MRSA by PCR Next Gen DETECTED (A) NOT DETECTED Final    Comment: RESULT CALLED TO, READ BACK BY AND VERIFIED WITH: WILLIAMSON,S RN @1851  ON 01/22/21 JACKSON,K (NOTE) The GeneXpert MRSA Assay (FDA approved for NASAL specimens only), is one component of a comprehensive MRSA colonization surveillance program. It is not intended to diagnose MRSA infection nor to guide or monitor treatment for MRSA infections. Test performance is not FDA approved in patients less than 34 years old. Performed at [facility], [place] [address], [place], [place] [address]      Labs: BNP (last 3 results) Recent Labs    06/13/20 1532  BNP 72.6   Basic Metabolic Panel: Recent Labs  Lab 01/20/21 2030 01/21/21 0232 01/22/21 0428 01/23/21 0505  NA 140 137 136 132*  K 3.4* 2.9* 3.6 3.8  CL 105 104 101 98  CO2 27 27 28 26   GLUCOSE 324* 201* 225* 280*  BUN 17 13 17  25*  CREATININE 0.93 0.64 0.79 0.93  CALCIUM 9.3 8.7* 9.1 9.2  MG  --  1.8  --   --    Liver Function Tests: No results for input(s): AST, ALT, ALKPHOS, BILITOT, PROT, ALBUMIN in the last 168 hours. No results for input(s): LIPASE, AMYLASE in the last 168 hours. No results for input(s): AMMONIA in the last 168 hours. CBC: Recent Labs  Lab 01/20/21 2030 01/22/21 0428 01/23/21 0505  WBC 8.5 5.7 7.3  HGB 13.2 11.8* 12.4  HCT 39.6 36.7 37.9  MCV 83.4 86.2 85.9  PLT 263 221 275   Cardiac Enzymes: No results for input(s): CKTOTAL, CKMB, CKMBINDEX, TROPONINI in the last 168 hours. BNP: Invalid input(s): POCBNP CBG: Recent Labs  Lab 01/22/21 1222 01/22/21 1731 01/22/21 2007 01/23/21 0746 01/23/21 1217  GLUCAP 291* 239* 269* 241* 257*   D-Dimer No results for input(s): DDIMER in the last 72 hours. Hgb A1c Recent Labs    01/21/21 0232  HGBA1C 9.9*  Lipid Profile Recent Labs    01/22/21 0428  CHOL 144  HDL 36*   LDLCALC NOT CALCULATED  TRIG 498*  CHOLHDL 4.0   Thyroid function studies No results for input(s): TSH, T4TOTAL, T3FREE, THYROIDAB in the last 72 hours.  Invalid input(s): FREET3 Anemia work up No results for input(s): VITAMINB12, FOLATE, FERRITIN, TIBC, IRON, RETICCTPCT in the last 72 hours. Urinalysis    Component Value Date/Time   COLORURINE YELLOW 01/21/2021 0100   APPEARANCEUR HAZY (A) 01/21/2021 0100   LABSPEC 1.010 01/21/2021 0100   PHURINE 8.0 01/21/2021 0100   GLUCOSEU >=500 (A) 01/21/2021 0100   HGBUR NEGATIVE 01/21/2021 0100   BILIRUBINUR NEGATIVE 01/21/2021 0100   KETONESUR 5 (A) 01/21/2021 0100   PROTEINUR 30 (A) 01/21/2021 0100   NITRITE POSITIVE (A) 01/21/2021 0100   LEUKOCYTESUR SMALL (A) 01/21/2021 0100   Sepsis Labs Invalid input(s): PROCALCITONIN,  WBC,  LACTICIDVEN Microbiology Recent Results (from the past 240 hour(s))  Urine Culture     Status: Abnormal   Collection Time: 01/21/21  1:00 AM   Specimen: Urine, Clean Catch  Result Value Ref Range Status   Specimen Description   Final    URINE, CLEAN CATCH Performed at [facility], [place] [address], [place], [place] [address]    Special Requests   Final    NONE Performed at [facility], [place] [address] [place],  [address]    Culture >=100,000 COLONIES/mL PROTEUS MIRABILIS (A)  Final   Report Status 01/23/2021 FINAL  Final   Organism ID, Bacteria PROTEUS MIRABILIS (A)  Final      Susceptibility   Proteus mirabilis - MIC*    AMPICILLIN <=2 SENSITIVE Sensitive     CEFAZOLIN <=4 SENSITIVE Sensitive     CEFEPIME <=0.12 SENSITIVE Sensitive     CEFTRIAXONE <=0.25 SENSITIVE Sensitive     CIPROFLOXACIN <=0.25 SENSITIVE Sensitive     GENTAMICIN <=1 SENSITIVE Sensitive     IMIPENEM 1 SENSITIVE Sensitive     NITROFURANTOIN 128 RESISTANT Resistant     TRIMETH/SULFA >=320 RESISTANT Resistant     AMPICILLIN/SULBACTAM <=2 SENSITIVE Sensitive     PIP/TAZO <=4  SENSITIVE Sensitive     * >=100,000 COLONIES/mL PROTEUS MIRABILIS  Resp Panel by RT-PCR (Flu A&B, Covid) Nasopharyngeal Swab     Status: None   Collection Time: 01/21/21  2:04 AM   Specimen: Nasopharyngeal Swab; Nasopharyngeal(NP) swabs in vial transport medium  Result Value Ref Range Status   SARS Coronavirus 2 by RT PCR NEGATIVE NEGATIVE Final    Comment: (NOTE) SARS-CoV-2 target nucleic acids are NOT DETECTED.  The SARS-CoV-2 RNA is generally detectable in upper respiratory specimens during the acute phase of infection. The lowest concentration of SARS-CoV-2 viral copies this assay can detect is 138 copies/mL. A negative result does not preclude SARS-Cov-2 infection and should not be used as the sole basis for treatment or other patient management decisions. A negative result may occur with  improper specimen collection/handling, submission of specimen other than nasopharyngeal swab, presence of viral mutation(s) within the areas targeted by this assay, and inadequate number of viral copies(<138 copies/mL). A negative result must be combined with clinical observations, patient history, and epidemiological information. The expected result is Negative.  Fact Sheet for Patients:  EntrepreneurPulse.com.au  Fact Sheet for Healthcare Providers:  IncredibleEmployment.be  This test is no t yet approved or cleared by the [place] FDA and  has been authorized for detection and/or diagnosis of SARS-CoV-2 by FDA under an  Emergency Use Authorization (EUA). This EUA will remain  in effect (meaning this test can be used) for the duration of the COVID-19 declaration under Section 564(b)(1) of the Act, 21 U.S.C.section 360bbb-3(b)(1), unless the authorization is terminated  or revoked sooner.       Influenza A by PCR NEGATIVE NEGATIVE Final   Influenza B by PCR NEGATIVE NEGATIVE Final    Comment: (NOTE) The Xpert Xpress SARS-CoV-2/FLU/RSV plus assay  is intended as an aid in the diagnosis of influenza from Nasopharyngeal swab specimens and should not be used as a sole basis for treatment. Nasal washings and aspirates are unacceptable for Xpert Xpress SARS-CoV-2/FLU/RSV testing.  Fact Sheet for Patients: EntrepreneurPulse.com.au  Fact Sheet for Healthcare Providers: IncredibleEmployment.be  This test is not yet approved or cleared by the [place] FDA and has been authorized for detection and/or diagnosis of SARS-CoV-2 by FDA under an Emergency Use Authorization (EUA). This EUA will remain in effect (meaning this test can be used) for the duration of the COVID-19 declaration under Section 564(b)(1) of the Act, 21 U.S.C. section 360bbb-3(b)(1), unless the authorization is terminated or revoked.  Performed at [facility], [place] [address], [place], [place] [address]   MRSA Next Gen by PCR, Nasal     Status: Abnormal   Collection Time: 01/22/21  3:51 PM   Specimen: Nasal Mucosa; Nasal Swab  Result Value Ref Range Status   MRSA by PCR Next Gen DETECTED (A) NOT DETECTED Final    Comment: RESULT CALLED TO, READ BACK BY AND VERIFIED WITH: WILLIAMSON,S RN @1851  ON 01/22/21 JACKSON,K (NOTE) The GeneXpert MRSA Assay (FDA approved for NASAL specimens only), is one component of a comprehensive MRSA colonization surveillance program. It is not intended to diagnose MRSA infection nor to guide or monitor treatment for MRSA infections. Test performance is not FDA approved in patients less than 3 years old. Performed at [facility], [place] [address], [place], [place] [address]      Time coordinating discharge: 35 minutes  SIGNED:   Cordelia Poche, MD [facility] 01/23/2021, 3:39 PM

## 2021-01-23 NOTE — TOC Transition Note (Signed)
Transition of Care [facility]) - CM/SW Discharge Note   Patient Details  Name: LANDRA HOWZE MRN: [id number] Date of Birth: [birth year]  Transition of Care [facility]) CM/SW Contact:  Trish Mage, LCSW Phone Number: 01/23/2021, 2:52 PM   Clinical Narrative:   Patient seen in follow up to MD request for benefits check for PO diabetes medications.  See note by TOC dpt Malinda.  In response to [place] note, made an appointment with wound care clinic next door for patient-earliest was in early December.  Patient confirms she has transportation to get to appointments "when my vehicle is running."  She lives alone with no supports.  Husband and mother both deceased, daughter is in a nursing home, and sister is other issues to attend to.  She has a fixed income, finds it difficult to cook and clean because of weakness in her hands, said something about residual damage from stroke. TOC will continue to follow during the course of hospitalization.     Final next level of care: Home/Self Care Barriers to Discharge: No Barriers Identified   Patient Goals and CMS Choice        Discharge Placement                       Discharge Plan and Services                                     Social Determinants of Health (SDOH) Interventions     Readmission Risk Interventions No flowsheet data found.

## 2021-01-23 NOTE — TOC Benefit Eligibility Note (Signed)
Transition of Care [facility]) Benefit Eligibility Note    Patient Details  Name: JAKYRA KENEALY MRN: [id number] Date of Birth: [birth year]   Medication/Dose: Vania Rea and Invokana  Covered?:  Vania Rea is covered Invokana not on formulary Wilder Glade is suggested)  Tier: Other  Prescription Coverage Preferred Pharmacy: local  Spoke with Person/Company/Phone Number:: [place]/ [employer] [phone]  Co-Pay: Vania Rea and Wilder Glade are $566.00, Invokana not on formulary  Prior Approval: No  Deductible: Met       Kerin Salen Phone Number: 01/23/2021, 11:54 AM

## 2021-01-23 NOTE — Progress Notes (Addendum)
Asked to help with transportation home.  Patient states she can not legally drive past 5 pm and her car is here at [employer].  She states if she takes taxi she is unable to retrieve care. Safe ride arranged for patient for discharge along with bus pass to get back to [facility] for vehicle.  Patient made aware of these arrangements.  Patient refuses this transportation states she will not use safe ride or other provided services.  She has asked to discharge to her car where she will drive herself home.

## 2021-01-23 NOTE — Progress Notes (Signed)
Pt discharged at this time. Pt is upset about being discharged and transportation, but continues to refuse safe transport. This nurse verified with Dr. Lonny Prude that pt is discharged home even though she cannot afford diabetic medications. Per DC order she will follow up with her PCP for diabetic management. Pt voices understanding of this. [facility] also in room to talk to pt regarding DC and transportation, see previous note. Floor manager, [place], notified as well of pts concerns. Per Dr. Lonny Prude and [facility] pt is to be discharged at this time. Pt was transported pt via wheelchair accompanied by Grandville Silos, NT to ER entrance where pts car is located. Pt states that she is going to call her daughter and talk to her while she drives home. Pt walked to car with her walker from home. Witnessed pt get inside her vehicle safely.

## 2021-01-23 NOTE — Discharge Instructions (Signed)
Amanda Butler,  You were in the hospital with chest pain. This was somewhat concerning for a heart related issue. The cardiologist recommended some procedures but you have declined at this time. Your chest pain has resolved, thankfully. You will still need to follow-up with the cardiologist for outpatient studies. Your medications have been adjusted. Your diabetes is poorly controlled and although we wanted to start you on new medication, your disability has made it unsafe to prescribe insulin or or other injectable medication. I also tried to get you an oral medication, however the co-pay was too high. Please follow-up closely with your PCP to discuss other way to manage your diabetes.

## 2021-01-26 DIAGNOSIS — Z23 Encounter for immunization: Secondary | ICD-10-CM | POA: Diagnosis not present

## 2021-02-03 NOTE — Progress Notes (Deleted)
Cardiology Clinic Note   Patient Name: Amanda Butler Date of Encounter: 02/03/2021  Primary Care Provider:  Gaynelle Arabian, MD Primary Cardiologist:  Sanda Klein, MD  Patient Profile    ***  Past Medical History    Past Medical History:  Diagnosis Date   Arthritis    "knees" (09/13/2015)   Asthma    Cellulitis 11/17/2018   LEFT LEG    Chronic diastolic CHF (congestive heart failure) ([place])    a. 09/2015 Echo: EF 55-60%, mild LVH, gr2DD, no rwma, mild MR, mildly dil LA.    GERD (gastroesophageal reflux disease)    High cholesterol    Hypertension    Midsternal chest pain    a. 09/2015 MV: no ischemia/infarct, EF 61%; b. 09/2015 Low prob V:Q scan.   Morbid obesity ([place])    Pneumonia ~ 2000   "mild case"   RBBB (right bundle branch block) approx 2010   Dr Marisue Humble did ECG and told her   Type II diabetes mellitus ([place])    Past Surgical History:  Procedure Laterality Date   BUNIONECTOMY Left    COLONOSCOPY WITH PROPOFOL N/A 09/23/2016   Procedure: COLONOSCOPY WITH PROPOFOL;  Surgeon: Garlan Fair, MD;  Location: [facility] ENDOSCOPY;  Service: Endoscopy;  Laterality: N/A;   MULTIPLE TOOTH EXTRACTIONS  ~ 1975   TENDON RELEASE Right    Leg   TUBAL LIGATION  ~ 1983    Allergies  Allergies  Allergen Reactions   Bee Venom Anaphylaxis   Iodine Shortness Of Breath and Rash   Shellfish Allergy Anaphylaxis   Actos [Pioglitazone] Other (See Comments)    Erratic heartbeat   Aspirin Hives   Celecoxib     Other reaction(s): Unknown   Latex Hives   Metformin And Related Other (See Comments)    Patient reports flu-like symptoms and fatigue   Morphine And Related Other (See Comments)    Childhood allergy (65 years old) , pt got sicker , temp went up, dr gave her the wrong medication   Nsaids Hives   Other     Xray dye   Ciprofloxacin Palpitations    Joint pain    History of Present Illness    ***  Home Medications    Prior to Admission medications   Medication  Sig Start Date End Date Taking? Authorizing Provider  acetaminophen (TYLENOL) 325 MG tablet Take 650 mg by mouth every 6 (six) hours as needed for moderate pain.    [provider]  fenofibrate 54 MG tablet Take 1 tablet (54 mg total) by mouth daily. 01/24/21 04/24/21  Mariel Aloe, MD  glimepiride (AMARYL) 4 MG tablet Take 1 tablet (4 mg total) by mouth every morning. 11/19/18 01/21/21  Radene Gunning, NP  isosorbide mononitrate (IMDUR) 30 MG 24 hr tablet Take 1 tablet (30 mg total) by mouth daily. 01/24/21 04/24/21  Mariel Aloe, MD  lisinopril (ZESTRIL) 20 MG tablet Take 1 tablet (20 mg total) by mouth daily. 01/23/21 04/23/21  Mariel Aloe, MD  metoprolol succinate (TOPROL-XL) 50 MG 24 hr tablet Take 50 mg by mouth daily. Take with or immediately following a meal.    [provider]  mometasone (NASONEX) 50 MCG/ACT nasal spray Place 2 sprays into the nose daily as needed (congestion).     [provider]  repaglinide (PRANDIN) 1 MG tablet Take 1 mg by mouth See admin instructions. Take 1 tablet 15 to 30 mins before meals once a day for diabetes 08/19/18  [provider]  rosuvastatin (CRESTOR) 40 MG tablet Take 1 tablet (40 mg total) by mouth daily. 01/23/21 04/23/21  Mariel Aloe, MD  Skin Protectants, Misc. (EUCERIN) cream Apply 1 application topically as needed for dry skin.    [provider]  triamterene-hydrochlorothiazide (MAXZIDE) 75-50 MG per tablet Take 1 tablet by mouth daily.    [provider]    Family History    Family History  Problem Relation Age of Onset   Diabetes Mother    Hypertension Mother    Diabetes Father    Hypertension Father    Heart attack Father 30   Diabetes Maternal Grandmother    Hypertension Maternal Grandmother    Cancer Maternal Grandfather    Diabetes Maternal Grandfather    Hypertension Maternal Grandfather    Asthma Paternal Grandmother    Diabetes Paternal Grandmother    Hypertension  Paternal Grandmother    Diabetes Paternal Grandfather    Hypertension Paternal Grandfather    Heart failure Paternal Grandfather    Diabetes Maternal Aunt    She indicated that her mother is deceased. She indicated that her father is deceased. She indicated that the status of her maternal grandmother is unknown. She indicated that the status of her maternal grandfather is unknown. She indicated that the status of her paternal grandmother is unknown. She indicated that the status of her paternal grandfather is unknown. She indicated that the status of her maternal aunt is unknown.  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 Patient lives at home alone. Is unable to perform ADL's independently due to lower leg wounds   Social Determinants of Health   Financial Resource Strain: Not on file  Food Insecurity: Not on file  Transportation Needs: Not on file  Physical Activity: Not on file  Stress: Not on file  Social Connections: Not on file  Intimate Partner Violence: Not on file     Review of Systems    General:  No chills, fever, night sweats or weight changes.  Cardiovascular:  No chest pain, dyspnea on exertion, edema, orthopnea, palpitations, paroxysmal nocturnal dyspnea. Dermatological: No rash, lesions/masses Respiratory: No cough, dyspnea Urologic: No hematuria, dysuria Abdominal:   No nausea, vomiting, diarrhea, bright  red blood per rectum, melena, or hematemesis Neurologic:  No visual changes, wkns, changes in mental status. All other systems reviewed and are otherwise negative except as noted above.  Physical Exam    VS:  There were no vitals taken for this visit. , BMI There is no height or weight on file to calculate BMI. GEN: Well nourished, well developed, in no acute distress. HEENT: normal. Neck: Supple, no JVD, carotid bruits, or masses. Cardiac: RRR, no murmurs, rubs, or gallops. No clubbing, cyanosis, edema.  Radials/DP/PT 2+ and equal bilaterally.  Respiratory:  Respirations regular and  unlabored, clear to auscultation bilaterally. GI: Soft, nontender, nondistended, BS + x 4. MS: no deformity or atrophy. Skin: warm and dry, no rash. Neuro:  Strength and sensation are intact. Psych: Normal affect.  Accessory Clinical Findings    Recent Labs: 06/13/2020: B Natriuretic Peptide 47.5 01/21/2021: Magnesium 1.8 01/23/2021: BUN 25; Creatinine, Ser 0.93; Hemoglobin 12.4; Platelets 275; Potassium 3.8; Sodium 132   Recent Lipid Panel    Component Value Date/Time   CHOL 144 01/22/2021 0428   TRIG 498 (H) 01/22/2021 0428   HDL 36 (L) 01/22/2021 0428   CHOLHDL 4.0 01/22/2021 0428   VLDL UNABLE TO CALCULATE IF TRIGLYCERIDE OVER 400 mg/dL 01/22/2021 0428   LDLCALC NOT CALCULATED 01/22/2021 0428    ECG personally reviewed by me today- *** - No acute changes  Assessment & Plan   1.  ***   Jossie Ng. Samayra Hebel NP-C    02/03/2021, 4:33 PM [facility] [address] Office [phone] Fax [phone]  Notice: This dictation was prepared with Dragon dictation along with smaller phrase technology. Any transcriptional errors that result from this process are unintentional and may not be corrected upon review.  I spent***minutes examining this patient, reviewing medications, and using patient centered shared decision making involving her cardiac care.  Prior  to her visit I spent greater than 20 minutes reviewing her past medical history,  medications, and prior cardiac tests.

## 2021-02-06 ENCOUNTER — Ambulatory Visit: Payer: Medicare Other | Admitting: General Practice

## 2021-02-09 ENCOUNTER — Other Ambulatory Visit: Payer: Self-pay

## 2021-02-09 ENCOUNTER — Emergency Department (HOSPITAL_COMMUNITY)
Admission: EM | Admit: 2021-02-09 | Discharge: 2021-02-10 | Disposition: A | Discharge status: Home / Self Care | Payer: Medicare Other | Attending: Emergency Medicine | Admitting: Emergency Medicine

## 2021-02-09 ENCOUNTER — Encounter (HOSPITAL_COMMUNITY): Payer: Self-pay

## 2021-02-09 ENCOUNTER — Other Ambulatory Visit (HOSPITAL_COMMUNITY): Payer: Self-pay

## 2021-02-09 DIAGNOSIS — Z91040 Latex allergy status: Secondary | ICD-10-CM | POA: Insufficient documentation

## 2021-02-09 DIAGNOSIS — E119 Type 2 diabetes mellitus without complications: Secondary | ICD-10-CM | POA: Diagnosis not present

## 2021-02-09 DIAGNOSIS — J45909 Unspecified asthma, uncomplicated: Secondary | ICD-10-CM | POA: Diagnosis not present

## 2021-02-09 DIAGNOSIS — Z79899 Other long term (current) drug therapy: Secondary | ICD-10-CM | POA: Diagnosis not present

## 2021-02-09 DIAGNOSIS — Z20822 Contact with and (suspected) exposure to covid-19: Secondary | ICD-10-CM | POA: Diagnosis not present

## 2021-02-09 DIAGNOSIS — R059 Cough, unspecified: Secondary | ICD-10-CM | POA: Diagnosis not present

## 2021-02-09 DIAGNOSIS — I110 Hypertensive heart disease with heart failure: Secondary | ICD-10-CM | POA: Diagnosis not present

## 2021-02-09 DIAGNOSIS — J019 Acute sinusitis, unspecified: Secondary | ICD-10-CM

## 2021-02-09 DIAGNOSIS — Z7984 Long term (current) use of oral hypoglycemic drugs: Secondary | ICD-10-CM | POA: Diagnosis not present

## 2021-02-09 DIAGNOSIS — I5032 Chronic diastolic (congestive) heart failure: Secondary | ICD-10-CM | POA: Diagnosis not present

## 2021-02-09 DIAGNOSIS — R519 Headache, unspecified: Secondary | ICD-10-CM | POA: Diagnosis present

## 2021-02-09 DIAGNOSIS — R9431 Abnormal electrocardiogram [ECG] [EKG]: Secondary | ICD-10-CM | POA: Diagnosis not present

## 2021-02-09 DIAGNOSIS — R509 Fever, unspecified: Secondary | ICD-10-CM | POA: Diagnosis not present

## 2021-02-09 LAB — COMPREHENSIVE METABOLIC PANEL
ALT: 13 U/L (ref 0–44)
AST: 15 U/L (ref 15–41)
Albumin: 3.6 g/dL (ref 3.5–5.0)
Alkaline Phosphatase: 86 U/L (ref 38–126)
Anion gap: 8 (ref 5–15)
BUN: 16 mg/dL (ref 8–23)
CO2: 26 mmol/L (ref 22–32)
Calcium: 8.7 mg/dL — ABNORMAL LOW (ref 8.9–10.3)
Chloride: 102 mmol/L (ref 98–111)
Creatinine, Ser: 0.76 mg/dL (ref 0.44–1.00)
GFR, Estimated: >60 mL/min (ref >60)
Glucose, Bld: 285 mg/dL — ABNORMAL HIGH (ref 70–99)
Potassium: 3 mmol/L — ABNORMAL LOW (ref 3.5–5.1)
Sodium: 136 mmol/L (ref 135–145)
Total Bilirubin: 0.9 mg/dL (ref 0.3–1.2)
Total Protein: 7.5 g/dL (ref 6.5–8.1)

## 2021-02-09 LAB — CBC WITH DIFFERENTIAL/PLATELET
Abs Immature Granulocytes: 0.04 10*3/uL (ref 0.00–0.07)
Basophils Absolute: 0 10*3/uL (ref 0.0–0.1)
Basophils Relative: 0 %
Eosinophils Absolute: 0.1 10*3/uL (ref 0.0–0.5)
Eosinophils Relative: 1 %
HCT: 36.5 % (ref 36.0–46.0)
Hemoglobin: 12.1 g/dL (ref 12.0–15.0)
Immature Granulocytes: 1 %
Lymphocytes Relative: 10 %
Lymphs Abs: 0.8 10*3/uL (ref 0.7–4.0)
MCH: 27.6 pg (ref 26.0–34.0)
MCHC: 33.2 g/dL (ref 30.0–36.0)
MCV: 83.3 fL (ref 80.0–100.0)
Monocytes Absolute: 0.4 10*3/uL (ref 0.1–1.0)
Monocytes Relative: 5 %
Neutro Abs: 6.4 10*3/uL (ref 1.7–7.7)
Neutrophils Relative %: 83 %
Platelets: 182 10*3/uL (ref 150–400)
RBC: 4.38 MIL/uL (ref 3.87–5.11)
RDW: 14.2 % (ref 11.5–15.5)
WBC: 7.9 10*3/uL (ref 4.0–10.5)
nRBC: 0 % (ref 0.0–0.2)

## 2021-02-09 NOTE — ED Notes (Signed)
Labeled urine specimen and culture sent to lab. ENM[place] 

## 2021-02-09 NOTE — ED Triage Notes (Signed)
Pt complains of fever, nasal congestion, and headache x 2 days. Pt reports having HTN but states that she is taking her medication.

## 2021-02-10 ENCOUNTER — Emergency Department (HOSPITAL_COMMUNITY): Payer: Medicare Other

## 2021-02-10 DIAGNOSIS — R509 Fever, unspecified: Secondary | ICD-10-CM | POA: Diagnosis not present

## 2021-02-10 DIAGNOSIS — R059 Cough, unspecified: Secondary | ICD-10-CM | POA: Diagnosis not present

## 2021-02-10 DIAGNOSIS — R519 Headache, unspecified: Secondary | ICD-10-CM | POA: Diagnosis not present

## 2021-02-10 LAB — RESP PANEL BY RT-PCR (FLU A&B, COVID) ARPGX2
Influenza A by PCR: NEGATIVE
Influenza B by PCR: NEGATIVE
SARS Coronavirus 2 by RT PCR: NEGATIVE

## 2021-02-10 LAB — URINALYSIS, ROUTINE W REFLEX MICROSCOPIC
Bilirubin Urine: NEGATIVE
Glucose, UA: >=500 mg/dL — AB
Ketones, ur: NEGATIVE mg/dL
Leukocytes,Ua: NEGATIVE
Nitrite: NEGATIVE
Protein, ur: NEGATIVE mg/dL
Specific Gravity, Urine: 1.01 (ref 1.005–1.030)
pH: 6 (ref 5.0–8.0)

## 2021-02-10 LAB — TROPONIN I (HIGH SENSITIVITY)
Troponin I (High Sensitivity): 14 ng/L (ref <18)
Troponin I (High Sensitivity): 12 ng/L (ref <18)

## 2021-02-10 MED ORDER — POTASSIUM CHLORIDE CRYS ER 20 MEQ PO TBCR
40.0000 meq | EXTENDED_RELEASE_TABLET | Freq: Once | ORAL | Status: AC
  Administered 2021-02-10: 40 meq via ORAL
  Filled 2021-02-10: qty 2

## 2021-02-10 MED ORDER — POTASSIUM CHLORIDE CRYS ER 20 MEQ PO TBCR: 20.0000 meq | EXTENDED_RELEASE_TABLET | Freq: Two times a day (BID) | ORAL | 0 refills | Status: DC

## 2021-02-10 MED ORDER — LISINOPRIL 20 MG PO TABS
20.0000 mg | ORAL_TABLET | Freq: Once | ORAL | Status: AC
  Administered 2021-02-10: 20 mg via ORAL
  Filled 2021-02-10: qty 1

## 2021-02-10 MED ORDER — AMOXICILLIN-POT CLAVULANATE 875-125 MG PO TABS: 1.0000 | ORAL_TABLET | Freq: Two times a day (BID) | ORAL | 0 refills | Status: DC

## 2021-02-10 MED ORDER — METOPROLOL SUCCINATE ER 50 MG PO TB24: 50.0000 mg | ORAL_TABLET | Freq: Every day | ORAL | Status: DC

## 2021-02-10 MED ORDER — ISOSORBIDE MONONITRATE ER 30 MG PO TB24
30.0000 mg | ORAL_TABLET | Freq: Every day | ORAL | Status: DC
  Administered 2021-02-10: 30 mg via ORAL
  Filled 2021-02-10: qty 1

## 2021-02-10 MED ORDER — ACETAMINOPHEN 325 MG PO TABS
650.0000 mg | ORAL_TABLET | Freq: Once | ORAL | Status: AC
  Administered 2021-02-10: 650 mg via ORAL
  Filled 2021-02-10: qty 2

## 2021-02-10 NOTE — Discharge Instructions (Addendum)
You were seen in the emergency department today for flulike symptoms.  Your COVID and flu test were negative.  Your chest x-ray did not show pneumonia.  Your blood work showed that your potassium was mildly low, that your blood sugar was somewhat high, and that you had a small amount of blood in your urine, please have these rechecked by your primary care provider.  Please include potassium rich foods in your diet and take the supplement prescribed over the next few days.  We are treating you for possible bacterial sinusitis with Augmentin, this is an antibiotic, please take this as prescribed.  Please take all of your antibiotics until finished. You may develop abdominal discomfort or diarrhea from the antibiotic.  You may help offset this with probiotics which you can buy at the store (ask your pharmacist if unable to find) or get probiotics in the form of eating yogurt. Do not eat or take the probiotics until 2 hours after your antibiotic. If you are unable to tolerate these side effects follow-up with your primary care provider or return to the emergency department.   If you begin to experience any blistering, rashes, swelling, or difficulty breathing seek medical care for evaluation of potentially more serious side effects.   Please be aware that this medication may interact with other medications you are taking, please be sure to discuss your medication list with your pharmacist.    Please take Tylenol per over-the-counter dosing as needed for pain.  Please take all of your blood pressure medication as prescribed as your blood pressure was elevated in the ER, this should be rechecked as well.  Follow-up with your primary care provider soon as possible.  Return to the emergency department for new or worsening symptoms including but not limited to new or worsening pain, inability to keep fluids down, fever after 48 hours of antibiotics, neck stiffness, passing out, chest pain not related to  coughing, chest pain with exertion/activity, trouble breathing, coughing up blood, inability to keep fluids down, sudden change or worsening in headache, change in your vision, numbness, weakness, change in your speech, facial droop, or any other concerns.

## 2021-02-10 NOTE — ED Provider Notes (Signed)
  Physical Exam  BP (!) 161/68   Pulse 65   Temp 99.5 F (37.5 C) (Oral)   Resp 18   Ht 5\' 6"  (1.676 m)   Wt 97.5 kg   SpO2 100%   BMI 34.70 kg/m   Physical Exam   ED Course/Procedures     Procedures  MDM    Care of this patient assumed from preceding ED provider S. Petrucelli, PA-C at time of shift change.  Please see her associated note for further insight into the patient's ED course.  In brief has had 2 days of subjective fever, congestion, and headache  as well as elevated blood pressure despite compliance with her medications.  Has been hypertensive throughout her stay but has been afebrile.  Vitals otherwise normal.  BP improved at this time. CBC unremarkable, CMP with hypokalemia of 3.  UA without sign of infection.  COVID-19 and influenza tests are negative.  Patient did endorse chest pain and upon arrival.  Initial troponin was 14, negative.  Delta troponin pending at this time.  EKG nonischemic with sinus rhythm and right bundle branch block.  Please see photo above as EKG did not crossover in the system.  Pending delta troponin at this time.  Note the troponin remains negative, 12. Hypertension has improved to 146/67 at this time after oral medications.  No further work-up warranted in the ER this time given reassuring work-up, physical exam, and vital signs.  Crissy voiced understanding for medical evaluation and treatment plan.  Each of her questions was answered to her expressed satisfaction.  Suspect her symptoms are secondary to an acute viral URI.  Increased hydration and over-the-counter medications as needed at home.  Recommend close outpatient follow-up.  Strict return precautions are given.  Patient is well-appearing, stable, and appropriate for discharge at this time.  This chart was dictated using voice recognition software, Dragon. Despite the best efforts of this provider to proofread and correct errors, errors may still occur which can change  documentation meaning.      Emeline Darling, PA-C 02/10/21 3491    Valarie Merino, MD 02/10/21 [phone]

## 2021-02-10 NOTE — ED Provider Notes (Signed)
[facility] DEPT Provider Note   CSN: [id number] Arrival date & time: 02/09/21  2212     History Chief Complaint  Patient presents with   Influenza    Flu like sxs    Amanda Butler is a 65 y.o. female with a history of CHF, hypertension, hypercholesterolemia, T2DM, asthma, and GERD who presents to the emergency department with complaints of flulike illness the past 3 days.  Please reports headaches, congestion, throat irritation, nausea, loose stool, and cough.  With coughing spells she does have some chest discomfort and trouble breathing trying to produce sputum, otherwise no chest pain or dyspnea.  She describes her headaches as being to the right sinuses, gradual onset, steady progression, no alleviating or aggravating factors.  She also mentions that her blood pressure is high.  She has been taking her blood pressure medications as prescribed.  States that she was recently admitted for a cardiac problem, she has not been able to fill her Imdur, this chest discomfort that she is having with coughing does not feel anything like the chest pain she was having when she was admitted to the hospital recently. she denies acute vision change, acute numbness/weakness, vomiting, abdominal pain, melena, hematochezia, hemoptysis, or syncope.  HPI     Past Medical History:  Diagnosis Date   Arthritis    "knees" (09/13/2015)   Asthma    Cellulitis 11/17/2018   LEFT LEG    Chronic diastolic CHF (congestive heart failure) ([place])    a. 09/2015 Echo: EF 55-60%, mild LVH, gr2DD, no rwma, mild MR, mildly dil LA.    GERD (gastroesophageal reflux disease)    High cholesterol    Hypertension    Midsternal chest pain    a. 09/2015 MV: no ischemia/infarct, EF 61%; b. 09/2015 Low prob V:Q scan.   Morbid obesity ([place])    Pneumonia ~ 2000   "mild case"   RBBB (right bundle branch block) approx 2010   Dr Marisue Humble did ECG and told her   Type II diabetes mellitus ([place])      Patient Active Problem List   Diagnosis Date Noted   Acute lower UTI 01/21/2021   Hypokalemia 01/21/2021   Hypertensive urgency 01/21/2021   Ulcers of both lower extremities, limited to breakdown of skin ([place])    Cellulitis of left leg 11/16/2018   Accelerated hypertension 09/19/2018   Pressure injury of skin 02/20/2018   Cellulitis 10/02/2017   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   Closed nondisplaced fracture of greater tuberosity of left humerus 04/23/2017   Type II diabetes mellitus ([place])    High cholesterol    Morbid obesity ([place])    Chronic diastolic CHF (congestive heart failure) ([place])    Midsternal chest pain    Pain in the chest    Essential hypertension    Morbid obesity due to excess calories ([place])    Chest pain 09/13/2015   Bilateral lower extremity edema 09/13/2015   Diabetes mellitus type 2, diet-controlled ([place]) 09/13/2015   Obesity 09/13/2015   Total bilirubin, elevated 09/13/2015   HTN (hypertension) 09/13/2015   Stasis dermatitis of both legs 09/13/2015   Left leg cellulitis 09/13/2015   RBBB 09/13/2015   Asthma 09/13/2015   GERD (gastroesophageal reflux disease) 09/13/2015   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    Past Surgical History:  Procedure Laterality Date   BUNIONECTOMY Left    COLONOSCOPY WITH PROPOFOL N/A 09/23/2016   Procedure: COLONOSCOPY WITH PROPOFOL;  Surgeon: Wynetta Emery,  Ursula Alert, MD;  Location: [facility];  Service: Endoscopy;  Laterality: N/A;   MULTIPLE TOOTH EXTRACTIONS  ~ [place] Right    Leg   TUBAL LIGATION  ~ 1983     OB History   No obstetric history on file.     Family History  Problem Relation Age of Onset   Diabetes Mother    Hypertension Mother    Diabetes Father    Hypertension Father    Heart attack Father 67   Diabetes Maternal Grandmother    Hypertension Maternal Grandmother    Cancer Maternal Grandfather    Diabetes Maternal Grandfather    Hypertension  Maternal Grandfather    Asthma Paternal Grandmother    Diabetes Paternal Grandmother    Hypertension Paternal Grandmother    Diabetes Paternal Grandfather    Hypertension Paternal Grandfather    Heart failure Paternal Grandfather    Diabetes Maternal Aunt     Social History   Tobacco Use   Smoking status: Never   Smokeless tobacco: Never  Vaping Use   Vaping Use: Never used  Substance Use Topics   Alcohol use: No   Drug use: No    Home Medications Prior to Admission medications   Medication Sig Start Date End Date Taking? Authorizing Provider  acetaminophen (TYLENOL) 325 MG tablet Take 650 mg by mouth every 6 (six) hours as needed for moderate pain.    [provider]  fenofibrate 54 MG tablet Take 1 tablet (54 mg total) by mouth daily. 01/24/21 04/24/21  Mariel Aloe, MD  glimepiride (AMARYL) 4 MG tablet Take 1 tablet (4 mg total) by mouth every morning. 11/19/18 01/21/21  Radene Gunning, NP  isosorbide mononitrate (IMDUR) 30 MG 24 hr tablet Take 1 tablet (30 mg total) by mouth daily. 01/24/21 04/24/21  Mariel Aloe, MD  lisinopril (ZESTRIL) 20 MG tablet Take 1 tablet (20 mg total) by mouth daily. 01/23/21 04/23/21  Mariel Aloe, MD  metoprolol succinate (TOPROL-XL) 50 MG 24 hr tablet Take 50 mg by mouth daily. Take with or immediately following a meal.    [provider]  mometasone (NASONEX) 50 MCG/ACT nasal spray Place 2 sprays into the nose daily as needed (congestion).     [provider]  repaglinide (PRANDIN) 1 MG tablet Take 1 mg by mouth See admin instructions. Take 1 tablet 15 to 30 mins before meals once a day for diabetes 08/19/18   [provider]  rosuvastatin (CRESTOR) 40 MG tablet Take 1 tablet (40 mg total) by mouth daily. 01/23/21 04/23/21  Mariel Aloe, MD  Skin Protectants, Misc. (EUCERIN) cream Apply 1 application topically as needed for dry skin.    [provider]  triamterene-hydrochlorothiazide (MAXZIDE)  75-50 MG per tablet Take 1 tablet by mouth daily.    [provider]    Allergies    Bee venom, Iodine, Shellfish allergy, Actos [pioglitazone], Aspirin, Celecoxib, Latex, Metformin and related, Morphine and related, Nsaids, Other, and Ciprofloxacin  Review of Systems   Review of Systems  Constitutional:  Positive for chills and fever.  HENT:  Positive for congestion and sinus pain. Negative for ear pain and trouble swallowing.   Eyes:  Negative for visual disturbance.  Respiratory:  Positive for cough and shortness of breath.   Cardiovascular:  Positive for chest pain.  Gastrointestinal:  Positive for diarrhea and nausea. Negative for abdominal pain, anal bleeding, blood in stool, constipation and vomiting.  Neurological:  Positive  for headaches. Negative for syncope, weakness and numbness.  All other systems reviewed and are negative.  Physical Exam Updated Vital Signs BP (!) 212/90 (BP Location: Left Arm)   Pulse 84   Temp 99.5 F (37.5 C) (Oral)   Resp 18   Ht 5\' 6"  (1.676 m)   Wt 97.5 kg   SpO2 100%   BMI 34.70 kg/m   Physical Exam Vitals and nursing note reviewed.  Constitutional:      General: She is not in acute distress.    Appearance: She is well-developed.  HENT:     Head: Normocephalic and atraumatic.     Right Ear: Tympanic membrane is not perforated, erythematous, retracted or bulging.     Left Ear: Tympanic membrane is not perforated, erythematous, retracted or bulging.     Ears:     Comments: No mastoid erythema/swelling/tenderness.  Nonobstructing cerumen present in bilateral EACs.    Nose:     Right Sinus: Maxillary sinus tenderness and frontal sinus tenderness present.     Left Sinus: No maxillary sinus tenderness or frontal sinus tenderness.     Mouth/Throat:     Pharynx: Uvula midline. No oropharyngeal exudate or posterior oropharyngeal erythema.     Comments: Posterior oropharynx is symmetric appearing. Patient tolerating own secretions  without difficulty. No trismus. No drooling. No hot potato voice. No swelling beneath the tongue, submandibular compartment is soft.  Eyes:     General:        Right eye: No discharge.        Left eye: No discharge.     Conjunctiva/sclera: Conjunctivae normal.     Pupils: Pupils are equal, round, and reactive to light.  Cardiovascular:     Rate and Rhythm: Normal rate and regular rhythm.     Heart sounds: No murmur heard. Pulmonary:     Effort: Pulmonary effort is normal. No respiratory distress.     Breath sounds: Normal breath sounds. No wheezing, rhonchi or rales.  Abdominal:     General: There is no distension.     Palpations: Abdomen is soft.     Tenderness: There is no abdominal tenderness.  Musculoskeletal:     Cervical back: Normal range of motion and neck supple. No edema or rigidity.     Right lower leg: Edema present.     Left lower leg: Edema present.     Comments: Skin irritation to the feet/ankles. No streaking erythema or purulent drainage.   Lymphadenopathy:     Cervical: No cervical adenopathy.  Skin:    General: Skin is warm and dry.     Findings: No rash.  Neurological:     Mental Status: She is alert.     Comments: Clear speech.  CN III through XII grossly intact.  Sensation grossly tact bilateral upper and lower extremities.  5-5 symmetric grip strength and strength with plantar dorsiflexion bilaterally.  Negative pronator drift.  Normal finger-nose.  Ambulatory with walker.  Psychiatric:        Behavior: Behavior normal.    ED Results / Procedures / Treatments   Labs (all labs ordered are listed, but only abnormal results are displayed) Labs Reviewed  COMPREHENSIVE METABOLIC PANEL - Abnormal; Notable for the following components:      Result Value   Potassium 3.0 (*)    Glucose, Bld 285 (*)    Calcium 8.7 (*)    All other components within normal limits  URINALYSIS, ROUTINE W REFLEX MICROSCOPIC - Abnormal; Notable for the  following components:    Color, Urine STRAW (*)    Glucose, UA >=500 (*)    Hgb urine dipstick SMALL (*)    Bacteria, UA RARE (*)    All other components within normal limits  RESP PANEL BY RT-PCR (FLU A&B, COVID) ARPGX2  CBC WITH DIFFERENTIAL/PLATELET    EKG None  Radiology DG Chest 2 View  Result Date: 02/10/2021 CLINICAL DATA:  Pt complains of fever, nasal congestion, and headache x 2 days. Hx of asthma, HTN, pna, DM, CHF. cough EXAM: CHEST - 2 VIEW COMPARISON:  01/20/2021 FINDINGS: Normal mediastinum and cardiac silhouette. Normal pulmonary vasculature. No evidence of effusion, infiltrate, or pneumothorax. No acute bony abnormality. IMPRESSION: No acute cardiopulmonary process. Electronically Signed   By: Suzy Bouchard M.D.   On: 02/10/2021 05:12    Procedures Procedures   Medications Ordered in ED Medications - No data to display  ED Course  I have reviewed the triage vital signs and the nursing notes.  Pertinent labs & imaging results that were available during my care of the patient were reviewed by me and considered in my medical decision making (see chart for details).    MDM Rules/Calculators/A&P                           Patient presents to the ED with complaints of flulike symptoms over the past 3 to 4 days.  Patient is nontoxic, notably hypertensive, vitals are otherwise unremarkable.  Additional history obtained:  Additional history obtained from chart review & nursing note review.   EKG: No STEMI  Lab Tests:  I Ordered, reviewed, and interpreted labs, which included:  CBC: Unremarkable CMP: Hyperglycemia without acidosis or anion gap elevation.  Mild hypokalemia- oral replacement COVID/flu testing: Negative Urinalysis: No UTI  Imaging Studies ordered:  I ordered imaging studies which included CXR, I independently reviewed, formal radiology impression shows: No acute cardiopulmonary process.   ED Course:  No signs of AOE, AOM, or mastoiditis on exam.  Oropharynx is clear,  low risk Centor score. Chest X without infiltrate therefore doubt pneumonia.  Chest x-ray without findings of acute CHF.  There is no wheezing to suggest asthma exacerbation. Recent admission for chest pain, patient states this feels nothing like her prior chest pain w/ admission, this pain only occurs with coughing, delta troponin pending at this time.  Abdomen is nontender without peritoneal signs.  Initial plan was for head CT, however patient does not feel she can tolerate this as the position for scan makes her uncomfortable, headache w/ gradual onset and steady progression, she has no focal neurologic deficits, her blood pressure is improving, I have a low suspicion for head bleed.  Given patient with worsening symptoms and reported fevers with sinus tenderness will cover for bacterial sinusitis with Augmentin.  She has no meningismus or altered mental status.  Blood pressure (!) 169/66, pulse 71, temperature 99.5 F (37.5 C), temperature source Oral, resp. rate 18, height 5\' 6"  (1.676 m), weight 97.5 kg, SpO2 99 %.   Patient care signed out to PA Sponsellar @ change of shift pending delta troponin, in no significant elevation or change in clinical status anticipate discharge home.    This is a shared visit with supervising physician Dr. Wyvonnia Dusky who has independently evaluated patient & provided guidance in evaluation/management/disposition, in agreement with care    Portions of this note were generated with Dragon dictation software. Dictation errors may occur despite best attempts at  proofreading.  Final Clinical Impression(s) / ED Diagnoses Final diagnoses:  Acute sinusitis, recurrence not specified, unspecified location    Rx / DC Orders ED Discharge Orders          Ordered    amoxicillin-clavulanate (AUGMENTIN) 875-125 MG tablet  Every 12 hours        02/10/21 0608    potassium chloride SA (KLOR-CON) 20 MEQ tablet  2 times daily        02/10/21 0610             Amaryllis Dyke, PA-C 02/10/21 0645    Ezequiel Essex, MD 02/11/21 [phone]

## 2021-02-18 ENCOUNTER — Inpatient Hospital Stay (HOSPITAL_COMMUNITY)
Admission: EM | Admit: 2021-02-18 | Discharge: 2021-02-21 | DRG: 603 | Disposition: A | Discharge status: Home / Self Care | Payer: Medicare Other | Attending: Internal Medicine | Admitting: Internal Medicine

## 2021-02-18 ENCOUNTER — Emergency Department (HOSPITAL_COMMUNITY): Payer: Medicare Other

## 2021-02-18 DIAGNOSIS — L0390 Cellulitis, unspecified: Secondary | ICD-10-CM

## 2021-02-18 DIAGNOSIS — H6122 Impacted cerumen, left ear: Secondary | ICD-10-CM | POA: Diagnosis present

## 2021-02-18 DIAGNOSIS — I110 Hypertensive heart disease with heart failure: Secondary | ICD-10-CM | POA: Diagnosis not present

## 2021-02-18 DIAGNOSIS — Z20822 Contact with and (suspected) exposure to covid-19: Secondary | ICD-10-CM | POA: Diagnosis present

## 2021-02-18 DIAGNOSIS — M79662 Pain in left lower leg: Secondary | ICD-10-CM | POA: Diagnosis not present

## 2021-02-18 DIAGNOSIS — K219 Gastro-esophageal reflux disease without esophagitis: Secondary | ICD-10-CM | POA: Diagnosis not present

## 2021-02-18 DIAGNOSIS — I872 Venous insufficiency (chronic) (peripheral): Secondary | ICD-10-CM | POA: Diagnosis present

## 2021-02-18 DIAGNOSIS — R2242 Localized swelling, mass and lump, left lower limb: Secondary | ICD-10-CM | POA: Diagnosis not present

## 2021-02-18 DIAGNOSIS — E876 Hypokalemia: Secondary | ICD-10-CM | POA: Diagnosis present

## 2021-02-18 DIAGNOSIS — I83028 Varicose veins of left lower extremity with ulcer other part of lower leg: Secondary | ICD-10-CM | POA: Diagnosis not present

## 2021-02-18 DIAGNOSIS — E119 Type 2 diabetes mellitus without complications: Secondary | ICD-10-CM | POA: Diagnosis present

## 2021-02-18 DIAGNOSIS — Z8249 Family history of ischemic heart disease and other diseases of the circulatory system: Secondary | ICD-10-CM | POA: Diagnosis not present

## 2021-02-18 DIAGNOSIS — I5032 Chronic diastolic (congestive) heart failure: Secondary | ICD-10-CM | POA: Diagnosis not present

## 2021-02-18 DIAGNOSIS — R059 Cough, unspecified: Secondary | ICD-10-CM | POA: Diagnosis not present

## 2021-02-18 DIAGNOSIS — Z79899 Other long term (current) drug therapy: Secondary | ICD-10-CM | POA: Diagnosis not present

## 2021-02-18 DIAGNOSIS — L97829 Non-pressure chronic ulcer of other part of left lower leg with unspecified severity: Secondary | ICD-10-CM | POA: Diagnosis not present

## 2021-02-18 DIAGNOSIS — J3489 Other specified disorders of nose and nasal sinuses: Secondary | ICD-10-CM

## 2021-02-18 DIAGNOSIS — E7800 Pure hypercholesterolemia, unspecified: Secondary | ICD-10-CM | POA: Diagnosis not present

## 2021-02-18 DIAGNOSIS — L03116 Cellulitis of left lower limb: Secondary | ICD-10-CM | POA: Diagnosis not present

## 2021-02-18 DIAGNOSIS — M7989 Other specified soft tissue disorders: Secondary | ICD-10-CM

## 2021-02-18 DIAGNOSIS — Z7984 Long term (current) use of oral hypoglycemic drugs: Secondary | ICD-10-CM

## 2021-02-18 DIAGNOSIS — R600 Localized edema: Secondary | ICD-10-CM | POA: Diagnosis not present

## 2021-02-18 DIAGNOSIS — R509 Fever, unspecified: Secondary | ICD-10-CM | POA: Diagnosis not present

## 2021-02-18 DIAGNOSIS — J0100 Acute maxillary sinusitis, unspecified: Secondary | ICD-10-CM | POA: Diagnosis not present

## 2021-02-18 DIAGNOSIS — Z833 Family history of diabetes mellitus: Secondary | ICD-10-CM

## 2021-02-18 DIAGNOSIS — L538 Other specified erythematous conditions: Secondary | ICD-10-CM | POA: Diagnosis not present

## 2021-02-18 DIAGNOSIS — R627 Adult failure to thrive: Secondary | ICD-10-CM | POA: Diagnosis not present

## 2021-02-18 DIAGNOSIS — I10 Essential (primary) hypertension: Secondary | ICD-10-CM | POA: Diagnosis present

## 2021-02-18 LAB — BASIC METABOLIC PANEL
Anion gap: 10 (ref 5–15)
BUN: 11 mg/dL (ref 8–23)
CO2: 25 mmol/L (ref 22–32)
Calcium: 8.8 mg/dL — ABNORMAL LOW (ref 8.9–10.3)
Chloride: 100 mmol/L (ref 98–111)
Creatinine, Ser: 0.68 mg/dL (ref 0.44–1.00)
GFR, Estimated: >60 mL/min (ref >60)
Glucose, Bld: 231 mg/dL — ABNORMAL HIGH (ref 70–99)
Potassium: 3.4 mmol/L — ABNORMAL LOW (ref 3.5–5.1)
Sodium: 135 mmol/L (ref 135–145)

## 2021-02-18 LAB — CBC WITH DIFFERENTIAL/PLATELET
Abs Immature Granulocytes: 0.05 10*3/uL (ref 0.00–0.07)
Basophils Absolute: 0 10*3/uL (ref 0.0–0.1)
Basophils Relative: 0 %
Eosinophils Absolute: 0 10*3/uL (ref 0.0–0.5)
Eosinophils Relative: 0 %
HCT: 36.6 % (ref 36.0–46.0)
Hemoglobin: 12.2 g/dL (ref 12.0–15.0)
Immature Granulocytes: 0 %
Lymphocytes Relative: 5 %
Lymphs Abs: 0.7 10*3/uL (ref 0.7–4.0)
MCH: 28.4 pg (ref 26.0–34.0)
MCHC: 33.3 g/dL (ref 30.0–36.0)
MCV: 85.3 fL (ref 80.0–100.0)
Monocytes Absolute: 0.5 10*3/uL (ref 0.1–1.0)
Monocytes Relative: 3 %
Neutro Abs: 12.6 10*3/uL — ABNORMAL HIGH (ref 1.7–7.7)
Neutrophils Relative %: 92 %
Platelets: 202 10*3/uL (ref 150–400)
RBC: 4.29 MIL/uL (ref 3.87–5.11)
RDW: 14.8 % (ref 11.5–15.5)
WBC: 13.9 10*3/uL — ABNORMAL HIGH (ref 4.0–10.5)
nRBC: 0 % (ref 0.0–0.2)

## 2021-02-18 LAB — RESP PANEL BY RT-PCR (FLU A&B, COVID) ARPGX2
Influenza A by PCR: NEGATIVE
Influenza B by PCR: NEGATIVE
SARS Coronavirus 2 by RT PCR: NEGATIVE

## 2021-02-18 MED ORDER — HYDROCODONE-ACETAMINOPHEN 5-325 MG PO TABS
1.0000 | ORAL_TABLET | Freq: Once | ORAL | Status: AC
  Administered 2021-02-18: 1 via ORAL
  Filled 2021-02-18: qty 1

## 2021-02-18 MED ORDER — AMOXICILLIN-POT CLAVULANATE 875-125 MG PO TABS
1.0000 | ORAL_TABLET | Freq: Once | ORAL | Status: AC
  Administered 2021-02-18: 1 via ORAL
  Filled 2021-02-18: qty 1

## 2021-02-18 NOTE — ED Notes (Signed)
Venous doppler not available until am, EDP notified

## 2021-02-18 NOTE — ED Provider Notes (Signed)
[facility] DEPT Provider Note   CSN: [id number] Arrival date & time: 02/18/21  0844     History Chief Complaint  Patient presents with   Fever    Amanda Butler is a 65 y.o. female.   Fever Associated symptoms: chills, congestion and cough   Associated symptoms: no chest pain, no confusion, no diarrhea, no dysuria, no ear pain, no headaches, no rash, no sore throat and no vomiting   Patient presents for persistent fevers.  She was seen in the ED 1 week ago.  At that time, there was some concern of bacterial rhinosinusitis.  She was prescribed Augmentin but apparently did not fill that prescription.  She has continued to have maxillary sinus pressure and cough.  Fevers have been subjective over the weekend.  Last night, she did check a sublingual temperature and had a fever of 102 degrees.  She has felt fatigued.  She has been able to tolerate p.o. intake.  She has not had abdominal pain, vomiting or diarrhea.  She has chronic swelling in her lower extremities.  Recently, she has had increased redness, warmth, and swelling to her distal LLE.  Medical history is notable for diabetes.    Past Medical History:  Diagnosis Date   Arthritis    "knees" (09/13/2015)   Asthma    Cellulitis 11/17/2018   LEFT LEG    Chronic diastolic CHF (congestive heart failure) ([place])    a. 09/2015 Echo: EF 55-60%, mild LVH, gr2DD, no rwma, mild MR, mildly dil LA.    GERD (gastroesophageal reflux disease)    High cholesterol    Hypertension    Midsternal chest pain    a. 09/2015 MV: no ischemia/infarct, EF 61%; b. 09/2015 Low prob V:Q scan.   Morbid obesity ([place])    Pneumonia ~ 2000   "mild case"   RBBB (right bundle branch block) approx 2010   Dr Marisue Humble did ECG and told her   Type II diabetes mellitus ([place])     Patient Active Problem List   Diagnosis Date Noted   Acute lower UTI 01/21/2021   Hypokalemia 01/21/2021   Hypertensive urgency 01/21/2021   Ulcers of both  lower extremities, limited to breakdown of skin ([place])    Cellulitis of left leg 11/16/2018   Accelerated hypertension 09/19/2018   Pressure injury of skin 02/20/2018   Cellulitis 10/02/2017   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   Closed nondisplaced fracture of greater tuberosity of left humerus 04/23/2017   Type II diabetes mellitus ([place])    High cholesterol    Morbid obesity ([place])    Chronic diastolic CHF (congestive heart failure) ([place])    Midsternal chest pain    Pain in the chest    Essential hypertension    Morbid obesity due to excess calories ([place])    Chest pain 09/13/2015   Bilateral lower extremity edema 09/13/2015   Diabetes mellitus type 2, diet-controlled ([place]) 09/13/2015   Obesity 09/13/2015   Total bilirubin, elevated 09/13/2015   HTN (hypertension) 09/13/2015   Stasis dermatitis of both legs 09/13/2015   Left leg cellulitis 09/13/2015   RBBB 09/13/2015   Asthma 09/13/2015   GERD (gastroesophageal reflux disease) 09/13/2015   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    Past Surgical History:  Procedure Laterality Date   BUNIONECTOMY Left    COLONOSCOPY WITH PROPOFOL N/A 09/23/2016   Procedure: COLONOSCOPY WITH PROPOFOL;  Surgeon: Garlan Fair, MD;  Location: [facility] END[facility]COPY;  Service: Endoscopy;  Laterality: N/A;   MULTIPLE TOOTH EXTRACTIONS  ~ [place] Right    Leg   TUBAL LIGATION  ~ 1983     OB History   No obstetric history on file.     Family History  Problem Relation Age of Onset   Diabetes Mother    Hypertension Mother    Diabetes Father    Hypertension Father    Heart attack Father 92   Diabetes Maternal Grandmother    Hypertension Maternal Grandmother    Cancer Maternal Grandfather    Diabetes Maternal Grandfather    Hypertension Maternal Grandfather    Asthma Paternal Grandmother    Diabetes Paternal Grandmother    Hypertension Paternal Grandmother    Diabetes Paternal Grandfather     Hypertension Paternal Grandfather    Heart failure Paternal Grandfather    Diabetes Maternal Aunt     Social History   Tobacco Use   Smoking status: Never   Smokeless tobacco: Never  Vaping Use   Vaping Use: Never used  Substance Use Topics   Alcohol use: No   Drug use: No    Home Medications Prior to Admission medications   Medication Sig Start Date End Date Taking? Authorizing Provider  acetaminophen (TYLENOL) 325 MG tablet Take 650 mg by mouth every 6 (six) hours as needed for moderate pain.   Yes [provider]  albuterol (VENTOLIN HFA) 108 (90 Base) MCG/ACT inhaler Inhale 2 puffs into the lungs every 6 (six) hours as needed for shortness of breath or wheezing. 01/14/21  Yes [provider]  fenofibrate 54 MG tablet Take 1 tablet (54 mg total) by mouth daily. 01/24/21 04/24/21 Yes Mariel Aloe, MD  glimepiride (AMARYL) 4 MG tablet Take 1 tablet (4 mg total) by mouth every morning. 11/19/18 02/10/22 Yes Black, Lezlie Octave, NP  isosorbide mononitrate (IMDUR) 30 MG 24 hr tablet Take 1 tablet (30 mg total) by mouth daily. 01/24/21 04/24/21 Yes Mariel Aloe, MD  lisinopril (ZESTRIL) 10 MG tablet Take 10 mg by mouth daily.   Yes [provider]  metoprolol succinate (TOPROL-XL) 50 MG 24 hr tablet Take 50 mg by mouth daily. Take with or immediately following a meal.   Yes [provider]  repaglinide (PRANDIN) 1 MG tablet Take 1 mg by mouth 2 (two) times daily before a meal. 08/19/18  Yes [provider]  rosuvastatin (CRESTOR) 20 MG tablet Take 20 mg by mouth daily.   Yes [provider]  Skin Protectants, Misc. (EUCERIN) cream Apply 1 application topically as needed for dry skin.   Yes [provider]  triamterene-hydrochlorothiazide (MAXZIDE) 75-50 MG per tablet Take 1 tablet by mouth daily.   Yes [provider]  amoxicillin-clavulanate (AUGMENTIN) 875-125 MG tablet Take 1 tablet by mouth every 12 (twelve) hours.  02/10/21   Petrucelli, Samantha R, PA-C  lisinopril (ZESTRIL) 20 MG tablet Take 1 tablet (20 mg total) by mouth daily. Patient not taking: Reported on 02/18/2021 01/23/21 04/23/21  Mariel Aloe, MD  potassium chloride SA (KLOR-CON) 20 MEQ tablet Take 1 tablet (20 mEq total) by mouth 2 (two) times daily. Patient not taking: No sig reported 02/10/21   Petrucelli, Samantha R, PA-C  rosuvastatin (CRESTOR) 40 MG tablet Take 1 tablet (40 mg total) by mouth daily. Patient not taking: Reported on 02/18/2021 01/23/21 04/23/21  Mariel Aloe, MD    Allergies    Bee venom, Iodine, Shellfish allergy, Actos [pioglitazone], Aspirin, Celecoxib, Latex, Metformin and  related, Morphine and related, Nsaids, Other, and Ciprofloxacin  Review of Systems   Review of Systems  Constitutional:  Positive for appetite change, chills, fatigue and fever.  HENT:  Positive for congestion, sinus pressure and sinus pain. Negative for ear pain, sore throat and trouble swallowing.   Eyes:  Negative for pain and visual disturbance.  Respiratory:  Positive for cough. Negative for chest tightness, shortness of breath and wheezing.   Cardiovascular:  Positive for leg swelling. Negative for chest pain and palpitations.  Gastrointestinal:  Negative for abdominal distention, abdominal pain, diarrhea and vomiting.  Genitourinary:  Negative for dysuria, flank pain, hematuria, pelvic pain and urgency.  Musculoskeletal:  Negative for arthralgias, back pain, joint swelling and neck pain.  Skin:  Positive for wound (Chronic, BLE). Negative for color change and rash.  Neurological:  Positive for light-headedness. Negative for seizures, syncope, speech difficulty, weakness, numbness and headaches.  Hematological:  Does not bruise/bleed easily.  Psychiatric/Behavioral:  Negative for confusion and decreased concentration.   All other systems reviewed and are negative.  Physical Exam Updated Vital Signs BP (!) 174/87   Pulse 84   Temp  98.4 F (36.9 C) (Oral)   Resp 16   Ht 5\' 6"  (1.676 m)   Wt 90.7 kg   SpO2 92%   BMI 32.28 kg/m   Physical Exam Vitals and nursing note reviewed.  Constitutional:      General: She is not in acute distress.    Appearance: She is well-developed. She is ill-appearing (Chronically). She is not toxic-appearing or diaphoretic.  HENT:     Head: Normocephalic and atraumatic.     Right Ear: Tympanic membrane, ear canal and external ear normal.     Left Ear: Tympanic membrane, ear canal and external ear normal.     Nose: Congestion present. No rhinorrhea.     Right Sinus: No maxillary sinus tenderness or frontal sinus tenderness.     Left Sinus: Maxillary sinus tenderness present. No frontal sinus tenderness.     Mouth/Throat:     Mouth: Mucous membranes are moist.     Pharynx: Oropharynx is clear. No oropharyngeal exudate.  Eyes:     Extraocular Movements: Extraocular movements intact.     Conjunctiva/sclera: Conjunctivae normal.  Cardiovascular:     Rate and Rhythm: Normal rate and regular rhythm.     Heart sounds: No murmur heard. Pulmonary:     Effort: Pulmonary effort is normal. No respiratory distress.     Breath sounds: Normal breath sounds. No wheezing or rales.  Chest:     Chest wall: No tenderness.  Abdominal:     Palpations: Abdomen is soft.     Tenderness: There is no abdominal tenderness.  Musculoskeletal:        General: Tenderness present. Normal range of motion.     Cervical back: Normal range of motion and neck supple. No tenderness.     Right lower leg: No edema.     Left lower leg: Edema present.  Skin:    General: Skin is warm and dry.     Findings: Erythema present.  Neurological:     General: No focal deficit present.     Mental Status: She is alert. She is disoriented.     Cranial Nerves: No cranial nerve deficit.     Sensory: No sensory deficit.     Motor: No weakness.  Psychiatric:        Mood and Affect: Mood normal.  Behavior: Behavior  normal.        Thought Content: Thought content normal.        Judgment: Judgment normal.       ED Results / Procedures / Treatments   Labs (all labs ordered are listed, but only abnormal results are displayed) Labs Reviewed  CBC WITH DIFFERENTIAL/PLATELET - Abnormal; Notable for the following components:      Result Value   WBC 13.9 (*)    Neutro Abs 12.6 (*)    All other components within normal limits  BASIC METABOLIC PANEL - Abnormal; Notable for the following components:   Potassium 3.4 (*)    Glucose, Bld 231 (*)    Calcium 8.8 (*)    All other components within normal limits  RESP PANEL BY RT-PCR (FLU A&B, COVID) ARPGX2  CULTURE, BLOOD (ROUTINE X 2)  CULTURE, BLOOD (ROUTINE X 2)  LACTIC ACID, PLASMA  LACTIC ACID, PLASMA    EKG None  Radiology DG Chest Portable 1 View  Result Date: 02/18/2021 CLINICAL DATA:  cough, fever EXAM: PORTABLE CHEST 1 VIEW COMPARISON:  February 10, 2021. FINDINGS: Borderline enlargement of the cardiac silhouette, similar. Both lungs are clear. No visible pleural effusions or pneumothorax. No acute osseous abnormality. IMPRESSION: No evidence of acute cardiopulmonary disease. Electronically Signed   By: Margaretha Sheffield M.D.   On: 02/18/2021 09:24    Procedures Procedures   Medications Ordered in ED Medications  dalbavancin (DALVANCE) 1,500 mg in dextrose 5 % 500 mL IVPB (has no administration in time range)  HYDROcodone-acetaminophen (NORCO/VICODIN) 5-325 MG per tablet 1 tablet (1 tablet Oral Given 02/18/21 2055)  amoxicillin-clavulanate (AUGMENTIN) 875-125 MG per tablet 1 tablet (1 tablet Oral Given 02/18/21 2329)    ED Course  I have reviewed the triage vital signs and the nursing notes.  Pertinent labs & imaging results that were available during my care of the patient were reviewed by me and considered in my medical decision making (see chart for details).    MDM Rules/Calculators/A&P                          Patient presents  for persistent fevers, fatigue, cough, and sinus pressure over the past week, in addition to worsening pain, redness, and swelling of her distal LLE.  She was previously prescribed Augmentin but did not fill that prescription.  T-max at home was 102 degrees.  She arrives in the ED afebrile.  She is overall well-appearing.  Labs notable for leukocytosis, consistent with acute infection.  She does have maxillary sinus tenderness on the left.  Given that she has had symptoms of sinusitis for over 10 days, bacterial etiology is possible.  Distal left lower extremity is also concerning for cellulitis.  Patient would benefit from a course of Augmentin, which would treat both possible sources of infection.  First dose was given in the ED.  Given the significant swelling of her distal LLE, DVT study was ordered.  This study was unavailable at night.  Patient was agreeable to wait in the ED, and even the ED waiting room for the study in the morning.  She does have concerns of driving home tonight due to poor night vision and living in a dangerous neighborhood.  Patient was placed into border status to wait DVT study in the morning.  Care of patient was signed out to oncoming ED provider.  Final Clinical Impression(s) / ED Diagnoses Final diagnoses:  Pain of maxillary sinus  Pain and swelling of lower leg, left    Rx / DC Orders ED Discharge Orders          Ordered    Ambulatory referral to Infectious Disease       Comments: Cellulitis patient:  Received dalbavancin on 02/19/2021.   02/19/21 0121             Godfrey Pick, MD 02/19/21 [phone]

## 2021-02-18 NOTE — ED Notes (Addendum)
Pt provided with two sandwiches and a drink. Once patient is done she will return to the lobby.

## 2021-02-18 NOTE — ED Triage Notes (Signed)
Pt states she had flu like symptoms 2 weeks ago. States she was told if she still had symptoms to come back to ER. Pt reports fever of 102 last night and 100.5 this morning. Pt states she feels weak and lightheaded.

## 2021-02-19 ENCOUNTER — Emergency Department (HOSPITAL_COMMUNITY)
Admit: 2021-02-19 | Discharge: 2021-02-19 | Disposition: A | Discharge status: Home / Self Care | Payer: Medicare Other | Attending: Emergency Medicine | Admitting: Emergency Medicine

## 2021-02-19 ENCOUNTER — Encounter (HOSPITAL_COMMUNITY): Payer: Self-pay | Admitting: Internal Medicine

## 2021-02-19 ENCOUNTER — Emergency Department (HOSPITAL_COMMUNITY): Payer: Medicare Other

## 2021-02-19 ENCOUNTER — Other Ambulatory Visit: Payer: Self-pay

## 2021-02-19 DIAGNOSIS — M7989 Other specified soft tissue disorders: Secondary | ICD-10-CM | POA: Diagnosis not present

## 2021-02-19 DIAGNOSIS — E7800 Pure hypercholesterolemia, unspecified: Secondary | ICD-10-CM | POA: Diagnosis present

## 2021-02-19 DIAGNOSIS — E119 Type 2 diabetes mellitus without complications: Secondary | ICD-10-CM | POA: Diagnosis present

## 2021-02-19 DIAGNOSIS — I110 Hypertensive heart disease with heart failure: Secondary | ICD-10-CM | POA: Diagnosis present

## 2021-02-19 DIAGNOSIS — L03116 Cellulitis of left lower limb: Principal | ICD-10-CM

## 2021-02-19 DIAGNOSIS — L538 Other specified erythematous conditions: Secondary | ICD-10-CM

## 2021-02-19 DIAGNOSIS — Z7984 Long term (current) use of oral hypoglycemic drugs: Secondary | ICD-10-CM | POA: Diagnosis not present

## 2021-02-19 DIAGNOSIS — Z833 Family history of diabetes mellitus: Secondary | ICD-10-CM | POA: Diagnosis not present

## 2021-02-19 DIAGNOSIS — I83028 Varicose veins of left lower extremity with ulcer other part of lower leg: Secondary | ICD-10-CM | POA: Diagnosis present

## 2021-02-19 DIAGNOSIS — Z79899 Other long term (current) drug therapy: Secondary | ICD-10-CM | POA: Diagnosis not present

## 2021-02-19 DIAGNOSIS — L0390 Cellulitis, unspecified: Secondary | ICD-10-CM | POA: Diagnosis present

## 2021-02-19 DIAGNOSIS — R627 Adult failure to thrive: Secondary | ICD-10-CM | POA: Diagnosis present

## 2021-02-19 DIAGNOSIS — Z20822 Contact with and (suspected) exposure to covid-19: Secondary | ICD-10-CM | POA: Diagnosis present

## 2021-02-19 DIAGNOSIS — E876 Hypokalemia: Secondary | ICD-10-CM | POA: Diagnosis present

## 2021-02-19 DIAGNOSIS — H6122 Impacted cerumen, left ear: Secondary | ICD-10-CM | POA: Diagnosis present

## 2021-02-19 DIAGNOSIS — L97829 Non-pressure chronic ulcer of other part of left lower leg with unspecified severity: Secondary | ICD-10-CM | POA: Diagnosis present

## 2021-02-19 DIAGNOSIS — Z8249 Family history of ischemic heart disease and other diseases of the circulatory system: Secondary | ICD-10-CM | POA: Diagnosis not present

## 2021-02-19 DIAGNOSIS — I5032 Chronic diastolic (congestive) heart failure: Secondary | ICD-10-CM | POA: Diagnosis present

## 2021-02-19 DIAGNOSIS — I872 Venous insufficiency (chronic) (peripheral): Secondary | ICD-10-CM | POA: Diagnosis present

## 2021-02-19 DIAGNOSIS — K219 Gastro-esophageal reflux disease without esophagitis: Secondary | ICD-10-CM | POA: Diagnosis present

## 2021-02-19 LAB — CBG MONITORING, ED
Glucose-Capillary: 258 mg/dL — ABNORMAL HIGH (ref 70–99)
Glucose-Capillary: 95 mg/dL (ref 70–99)

## 2021-02-19 LAB — GLUCOSE, CAPILLARY: Glucose-Capillary: 156 mg/dL — ABNORMAL HIGH (ref 70–99)

## 2021-02-19 LAB — LACTIC ACID, PLASMA: Lactic Acid, Venous: 1.6 mmol/L (ref 0.5–1.9)

## 2021-02-19 MED ORDER — CLINDAMYCIN PHOSPHATE 900 MG/50ML IV SOLN
900.0000 mg | Freq: Three times a day (TID) | INTRAVENOUS | Status: DC
  Administered 2021-02-19 – 2021-02-20 (×3): 900 mg via INTRAVENOUS
  Filled 2021-02-19 (×4): qty 50

## 2021-02-19 MED ORDER — ONDANSETRON HCL 4 MG PO TABS
4.0000 mg | ORAL_TABLET | Freq: Four times a day (QID) | ORAL | Status: DC | PRN
  Administered 2021-02-20: 4 mg via ORAL
  Filled 2021-02-19: qty 1

## 2021-02-19 MED ORDER — METOPROLOL SUCCINATE ER 50 MG PO TB24
50.0000 mg | ORAL_TABLET | Freq: Every day | ORAL | Status: DC
  Administered 2021-02-19 – 2021-02-21 (×3): 50 mg via ORAL
  Filled 2021-02-19 (×3): qty 1

## 2021-02-19 MED ORDER — DEXTROSE 5 % IV SOLN
1500.0000 mg | Freq: Once | INTRAVENOUS | Status: AC
  Administered 2021-02-19: 1500 mg via INTRAVENOUS
  Filled 2021-02-19: qty 75

## 2021-02-19 MED ORDER — INSULIN ASPART 100 UNIT/ML IJ SOLN
0.0000 [IU] | Freq: Three times a day (TID) | INTRAMUSCULAR | Status: DC
Start: 2021-02-19 — End: 2021-02-21
  Administered 2021-02-19: 8 [IU] via SUBCUTANEOUS
  Administered 2021-02-20 (×2): 2 [IU] via SUBCUTANEOUS
  Administered 2021-02-21: 3 [IU] via SUBCUTANEOUS
  Filled 2021-02-19: qty 0.15

## 2021-02-19 MED ORDER — REPAGLINIDE 1 MG PO TABS
1.0000 mg | ORAL_TABLET | Freq: Two times a day (BID) | ORAL | Status: DC
  Administered 2021-02-19 – 2021-02-21 (×4): 1 mg via ORAL
  Filled 2021-02-19 (×5): qty 1

## 2021-02-19 MED ORDER — ACETAMINOPHEN 650 MG RE SUPP: 650.0000 mg | Freq: Four times a day (QID) | RECTAL | Status: DC | PRN

## 2021-02-19 MED ORDER — ONDANSETRON HCL 4 MG/2ML IJ SOLN: 4.0000 mg | Freq: Four times a day (QID) | INTRAMUSCULAR | Status: DC | PRN

## 2021-02-19 MED ORDER — ACETAMINOPHEN 325 MG PO TABS: 650.0000 mg | ORAL_TABLET | Freq: Four times a day (QID) | ORAL | Status: DC | PRN

## 2021-02-19 MED ORDER — GLIMEPIRIDE 4 MG PO TABS
4.0000 mg | ORAL_TABLET | ORAL | Status: DC
  Administered 2021-02-19 – 2021-02-20 (×2): 4 mg via ORAL
  Filled 2021-02-19 (×2): qty 1

## 2021-02-19 MED ORDER — FENOFIBRATE 54 MG PO TABS
54.0000 mg | ORAL_TABLET | Freq: Every day | ORAL | Status: DC
  Administered 2021-02-19 – 2021-02-21 (×3): 54 mg via ORAL
  Filled 2021-02-19 (×3): qty 1

## 2021-02-19 MED ORDER — ROSUVASTATIN CALCIUM 20 MG PO TABS
20.0000 mg | ORAL_TABLET | Freq: Every day | ORAL | Status: DC
  Administered 2021-02-19 – 2021-02-21 (×3): 20 mg via ORAL
  Filled 2021-02-19 (×3): qty 1

## 2021-02-19 MED ORDER — POTASSIUM CHLORIDE 20 MEQ PO PACK
20.0000 meq | PACK | Freq: Once | ORAL | Status: AC
  Administered 2021-02-19: 20 meq via ORAL
  Filled 2021-02-19: qty 1

## 2021-02-19 MED ORDER — TRIAMTERENE-HCTZ 75-50 MG PO TABS
1.0000 | ORAL_TABLET | Freq: Every day | ORAL | Status: DC
  Administered 2021-02-19 – 2021-02-21 (×3): 1 via ORAL
  Filled 2021-02-19 (×3): qty 1

## 2021-02-19 MED ORDER — LISINOPRIL 10 MG PO TABS
10.0000 mg | ORAL_TABLET | Freq: Every day | ORAL | Status: DC
  Administered 2021-02-19 – 2021-02-21 (×3): 10 mg via ORAL
  Filled 2021-02-19 (×3): qty 1

## 2021-02-19 MED ORDER — ISOSORBIDE MONONITRATE ER 30 MG PO TB24
30.0000 mg | ORAL_TABLET | Freq: Every day | ORAL | Status: DC
  Administered 2021-02-19 – 2021-02-21 (×3): 30 mg via ORAL
  Filled 2021-02-19 (×3): qty 1

## 2021-02-19 MED ORDER — ALBUTEROL SULFATE (2.5 MG/3ML) 0.083% IN NEBU: 2.5000 mg | INHALATION_SOLUTION | Freq: Four times a day (QID) | RESPIRATORY_TRACT | Status: DC | PRN

## 2021-02-19 MED ORDER — ENOXAPARIN SODIUM 40 MG/0.4ML IJ SOSY
40.0000 mg | PREFILLED_SYRINGE | INTRAMUSCULAR | Status: DC
  Administered 2021-02-19 – 2021-02-20 (×2): 40 mg via SUBCUTANEOUS
  Filled 2021-02-19 (×2): qty 0.4

## 2021-02-19 MED ORDER — OXYCODONE HCL 5 MG PO TABS: 5.0000 mg | ORAL_TABLET | ORAL | Status: DC | PRN

## 2021-02-19 MED ORDER — CARBAMIDE PEROXIDE 6.5 % OT SOLN
10.0000 [drp] | Freq: Two times a day (BID) | OTIC | Status: DC
  Administered 2021-02-20 – 2021-02-21 (×3): 10 [drp] via OTIC
  Filled 2021-02-19 (×2): qty 15

## 2021-02-19 NOTE — Progress Notes (Signed)
LLE venous duplex has been completed.  Preliminary results given to Dr. Johnney Killian.  Results can be found under chart review under CV PROC. 02/19/2021 8:55 AM Golda Zavalza RVT, RDMS

## 2021-02-19 NOTE — ED Provider Notes (Signed)
Care assumed from Dr. Doren Custard.  Patient with lower extremity cellulitis and fevers.  Extensive cellulitis as depicted.  However she is not toxic or septic.  She qualifies for Dalvance and will attempt to avoid hospital admission.  She is awaiting venous Doppler study and will wait in the waiting room.   Ezequiel Essex, MD 02/19/21 [phone]

## 2021-02-19 NOTE — Progress Notes (Signed)
Pharmacy Note:  Dalbavancin for Acute Bacterial Skin and Skin Structure Infection (ABSSSI) Patients to [facility] Discharge Amanda Butler is an 65 y.o. female who presented to [facility] on 02/18/2021 with an Acute Bacterial Skin and Skin Structure Infection  Inclusion criteria - Indication []  Moderately large skin lesion (>=75 cm2 or larger - about the size of a baseball) [x]  Cellulitis  Inclusion Criteria - at least one SIRS criteria present [x]  WBC > 12,000 or < 4000 []  temp >100.9 or < 96.8 []  heart rate >90[]  respiratory rate >20  Patient was evaluated for the following exclusion criteria and no exclusions were found  Hardware involvement, Hypotension / shock, Elevated lactate (>2) without other explanation, ram-negative infection risk factors (bites, water exposure, infection after trauma, infection after skin graft, neutropenia, burns, severe immunocompromise), necrotizing fasciitis possible or confirmed, Known or suspected osteomyelitis or septic arthritis, endocarditis, diabetic foot infection, ischemic ulcers, post-operative wound infection, perirectal infections, need for drainage in the operating room, hand or facial infections, injection drug users with a fever, bacteremia, pregnancy or breastfeeding, allergy to related antibiotics like vancomycin, known liver disease (t.bili >2x ULN or AST/ALT 3x ULN)  Reily Treloar, Toribio Harbour, PharmD 02/19/2021, 1:21 AM Clinical Pharmacist

## 2021-02-19 NOTE — ED Notes (Signed)
Pt provided dinner tray and assisted to seated position to eat.

## 2021-02-19 NOTE — Consult Note (Signed)
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  She presented with history of fever and left lower extremity edema and erythema.  On arrival, patient was afebrile with WBC 13.9K.  She was given 1 dose of dalbavancin.  ID consulted for antibiotic recommendations. #Left lower extremity cellulitis in the setting of venous stasis -SP dalbavancin x1 dose -No dvt seen on u/s on 11/8  Recommendations:  -Add clindamycin 900 mg IV 3 times x3 days daily for antitoxin effect - Does not need vancomycin as dalbavancin provides appropriate coverage - Follow-up blood cultures, will continue to monitor clinically.  Microbiology:  Antibiotics: Amoxicillin 11/7 Dalbavancin 11/78 Cultures: Blood Cx 11/8   HPI: Amanda Butler is a 65 y.o. female with asthma, GERD, type 2 diabetes, venous stasis ulcers with multiple episodes cellulitis, hypertension, hyperlipidemia presented to the ED due to history of fever and flulike symptoms.  She reported having fever 102, two nights ago and 100.5 last night  with associated fatigue, weakness, lightheadedness.  She also have reported lower extremity erythema and edema increased from baseline.  On arrival, patient's temp was 99.8, WBC 13.9.  She was started on dalbavancin x1 dose.  X-ray left tibia and fibula showed soft tissue edema throughout left lower extremity.  She was admitted left lower  extremity cellulites.  ID consulted for antibiotic recommendation.   Today, patient is on room air and resting in bed.  She reported her left lower  being on Augmentin for the last few days due to her sinusitis.  She lives in a two-story house and it is difficult for her get around.   Review of Systems: Review of Systems  Constitutional:  Positive for fever.  Cardiovascular:  Positive for leg swelling.   Past Medical History:  Diagnosis Date   Arthritis    "knees" (09/13/2015)   Asthma    Cellulitis 11/17/2018   LEFT LEG    Chronic diastolic CHF (congestive heart failure) ([place])    a. 09/2015 Echo: EF 55-60%, mild LVH, gr2DD, no rwma, mild MR, mildly dil LA.    GERD (gastroesophageal reflux disease)    High cholesterol    Hypertension    Midsternal chest pain    a. 09/2015 MV: no ischemia/infarct, EF 61%; b. 09/2015 Low prob V:Q scan.   Morbid obesity ([place])    Pneumonia ~ 2000   "mild case"   RBBB (right bundle branch block) approx 2010   Dr Marisue Humble did ECG and told her   Type II diabetes mellitus ([place])     Social History   Tobacco Use   Smoking status: Never   Smokeless tobacco: Never  Vaping Use   Vaping Use: Never used  Substance Use Topics   Alcohol use: No   Drug use: No    Family History  Problem Relation Age of Onset   Diabetes Mother    Hypertension Mother  Diabetes Father    Hypertension Father    Heart attack Father 29   Diabetes Maternal Grandmother    Hypertension Maternal Grandmother    Cancer Maternal Grandfather    Diabetes Maternal Grandfather    Hypertension Maternal Grandfather    Asthma Paternal Grandmother    Diabetes Paternal Grandmother    Hypertension Paternal Grandmother    Diabetes Paternal Grandfather    Hypertension Paternal Grandfather    Heart failure Paternal Grandfather    Diabetes Maternal Aunt    Scheduled Meds:  carbamide peroxide  10 drop Left EAR BID   enoxaparin (LOVENOX) injection  40 mg Subcutaneous Q24H   fenofibrate   54 mg Oral Daily   glimepiride  4 mg Oral [id number]   insulin aspart  0-15 Units Subcutaneous TID WC   isosorbide mononitrate  30 mg Oral Daily   lisinopril  10 mg Oral Daily   metoprolol succinate  50 mg Oral Daily   repaglinide  1 mg Oral BID AC   rosuvastatin  20 mg Oral Daily   triamterene-hydrochlorothiazide  1 tablet Oral Daily   Continuous Infusions:  clindamycin (CLEOCIN) IV     PRN Meds:.acetaminophen **OR** acetaminophen, albuterol, ondansetron **OR** ondansetron (ZOFRAN) IV, oxyCODONE Allergies  Allergen Reactions   Bee Venom Anaphylaxis   Iodine Shortness Of Breath and Rash   Shellfish Allergy Anaphylaxis   Actos [Pioglitazone] Other (See Comments)    Erratic heartbeat   Aspirin Hives   Celecoxib     Other reaction(s): Unknown   Latex Hives   Metformin And Related Other (See Comments)    Patient reports flu-like symptoms and fatigue   Morphine And Related Other (See Comments)    Childhood allergy (65 years old) , pt got sicker , temp went up, dr gave her the wrong medication   Nsaids Hives   Other     Xray dye   Ciprofloxacin Palpitations    Joint pain    OBJECTIVE: Blood pressure (!) 174/74, pulse 74, temperature 98.4 F (36.9 C), temperature source Oral, resp. rate 18, height 5\' 6"  (1.676 m), weight 90.7 kg, SpO2 97 %.  Physical Exam Constitutional:      Appearance: Normal appearance.  HENT:     Head: Normocephalic and atraumatic.     Right Ear: Tympanic membrane normal.     Left Ear: Tympanic membrane normal.     Nose: Nose normal.     Mouth/Throat:     Mouth: Mucous membranes are moist.  Eyes:     Extraocular Movements: Extraocular movements intact.     Conjunctiva/sclera: Conjunctivae normal.     Pupils: Pupils are equal, round, and reactive to light.  Cardiovascular:     Rate and Rhythm: Normal rate and regular rhythm.     Heart sounds: No murmur heard.   No friction rub. No gallop.  Pulmonary:     Effort: Pulmonary effort is normal.      Breath sounds: Normal breath sounds.  Abdominal:     General: Abdomen is flat.     Palpations: Abdomen is soft.  Musculoskeletal:        General: Normal range of motion.  Skin:    Comments: B/l LE ulcer. LLE edema and erythema proceeding to above the knee.  Neurological:     General: No focal deficit present.     Mental Status: She is alert and oriented to person, place, and time.  Psychiatric:        Mood and Affect: Mood normal.  Lab Results Lab Results  Component Value Date   WBC 13.9 (H) 02/18/2021   HGB 12.2 02/18/2021   HCT 36.6 02/18/2021   MCV 85.3 02/18/2021   PLT 202 02/18/2021    Lab Results  Component Value Date   CREATININE 0.68 02/18/2021   BUN 11 02/18/2021   NA 135 02/18/2021   K 3.4 (L) 02/18/2021   CL 100 02/18/2021   CO2 25 02/18/2021    Lab Results  Component Value Date   ALT 13 02/09/2021   AST 15 02/09/2021   ALKPHOS 86 02/09/2021   BILITOT 0.9 02/09/2021       Laurice Record, [facility] Infectious Disease [facility] 02/19/2021, 4:27 PM

## 2021-02-19 NOTE — H&P (Signed)
History and Physical    Amanda MYER OYD:[id number] DOB: 1955/10/10 DOA: 02/18/2021  PCP: Gaynelle Arabian, MD   Patient coming from: Home.  I have personally briefly reviewed patient's old medical records in [place]  Chief Complaint: Fever and flulike symptoms.  HPI: Amanda Butler is a 65 y.o. female with medical history significant of osteoarthritis of the knees, asthma, GERD, hyperlipidemia, hypertension, type 2 diabetes, class I obesity, history of pneumonia, RVDD, venous stasis ulcers with history of multiple episodes of cellulitis who is coming to the emergency department from his coming to the emergency department due to fever and flulike symptoms.  She reported having a fever of 102F the previous night and 100.6F yesterday morning.  Her left lower extremity is very edematous and erythematous when compared to baseline.  She complains of fatigue, weakness and lightheadedness.  No rhinorrhea, sore throat, wheezing or hemoptysis.  Denied chest pain, palpitations, diaphoresis, PND, orthopnea, but gets frequent lower extremity edema.  No abdominal pain, nausea, vomiting, diarrhea, constipation, melena or hematochezia.  Denied flank pain, dysuria, frequency or hematuria.  Denied polyuria, polydipsia, polyphagia or blurred vision.  ED Course: Initial vital signs were temperature 99.9 F, pulse 88, respiration 18, BP 181/68 mmHg O2 sat 99% on room air.  The patient was given Norco 5/325 mg tablet x1, dalvavancin 1500 mg IVPB x1 and then was consulted for vancomycin per pharmacy.  ID was consulted.  Lab work: CBC showed a white count 13.9, hemoglobin 2 g/dL platelets 202.  BMP showed a potassium of 3.4 mmol/L.  Glucose 231 and calcium 8.8 mg/dL.  The rest of the BMP values were normal.  Lactic acid was normal.  Imaging: Left tibia and fibula x-ray shows soft tissue edema throughout the left lower extremity.  Please see images and phonated report for further details.  Review of  Systems: As per HPI otherwise all other systems reviewed and are negative.  Past Medical History:  Diagnosis Date   Arthritis    "knees" (09/13/2015)   Asthma    Cellulitis 11/17/2018   LEFT LEG    Chronic diastolic CHF (congestive heart failure) ([place])    a. 09/2015 Echo: EF 55-60%, mild LVH, gr2DD, no rwma, mild MR, mildly dil LA.    GERD (gastroesophageal reflux disease)    High cholesterol    Hypertension    Midsternal chest pain    a. 09/2015 MV: no ischemia/infarct, EF 61%; b. 09/2015 Low prob V:Q scan.   Morbid obesity ([place])    Pneumonia ~ 2000   "mild case"   RBBB (right bundle branch block) approx 2010   Dr Marisue Humble did ECG and told her   Type II diabetes mellitus ([place])     Past Surgical History:  Procedure Laterality Date   BUNIONECTOMY Left    COLONOSCOPY WITH PROPOFOL N/A 09/23/2016   Procedure: COLONOSCOPY WITH PROPOFOL;  Surgeon: Garlan Fair, MD;  Location: [facility];  Service: Endoscopy;  Laterality: N/A;   MULTIPLE TOOTH EXTRACTIONS  ~ [facility] Right    Leg   TUBAL LIGATION  ~ 1983   Social History  reports that she has never smoked. She has never used smokeless tobacco. She reports that she does not drink alcohol and does not use drugs.  Allergies  Allergen Reactions   Bee Venom Anaphylaxis   Iodine Shortness Of Breath and Rash   Shellfish Allergy Anaphylaxis   Actos [Pioglitazone] Other (See Comments)    Erratic heartbeat   Aspirin  Hives   Celecoxib     Other reaction(s): Unknown   Latex Hives   Metformin And Related Other (See Comments)    Patient reports flu-like symptoms and fatigue   Morphine And Related Other (See Comments)    Childhood allergy (65 years old) , pt got sicker , temp went up, dr gave her the wrong medication   Nsaids Hives   Other     Xray dye   Ciprofloxacin Palpitations    Joint pain   Family History  Problem Relation Age of Onset   Diabetes Mother    Hypertension Mother    Diabetes Father     Hypertension Father    Heart attack Father 70   Diabetes Maternal Grandmother    Hypertension Maternal Grandmother    Cancer Maternal Grandfather    Diabetes Maternal Grandfather    Hypertension Maternal Grandfather    Asthma Paternal Grandmother    Diabetes Paternal Grandmother    Hypertension Paternal Grandmother    Diabetes Paternal Grandfather    Hypertension Paternal Grandfather    Heart failure Paternal Grandfather    Diabetes Maternal Aunt    Prior to Admission medications   Medication Sig Start Date End Date Taking? Authorizing Provider  acetaminophen (TYLENOL) 325 MG tablet Take 650 mg by mouth every 6 (six) hours as needed for moderate pain.   Yes [provider]  albuterol (VENTOLIN HFA) 108 (90 Base) MCG/ACT inhaler Inhale 2 puffs into the lungs every 6 (six) hours as needed for shortness of breath or wheezing. 01/14/21  Yes [provider]  fenofibrate 54 MG tablet Take 1 tablet (54 mg total) by mouth daily. 01/24/21 04/24/21 Yes Mariel Aloe, MD  glimepiride (AMARYL) 4 MG tablet Take 1 tablet (4 mg total) by mouth every morning. 11/19/18 02/10/22 Yes Black, Lezlie Octave, NP  isosorbide mononitrate (IMDUR) 30 MG 24 hr tablet Take 1 tablet (30 mg total) by mouth daily. 01/24/21 04/24/21 Yes Mariel Aloe, MD  lisinopril (ZESTRIL) 10 MG tablet Take 10 mg by mouth daily.   Yes [provider]  metoprolol succinate (TOPROL-XL) 50 MG 24 hr tablet Take 50 mg by mouth daily. Take with or immediately following a meal.   Yes [provider]  repaglinide (PRANDIN) 1 MG tablet Take 1 mg by mouth 2 (two) times daily before a meal. 08/19/18  Yes [provider]  rosuvastatin (CRESTOR) 20 MG tablet Take 20 mg by mouth daily.   Yes [provider]  Skin Protectants, Misc. (EUCERIN) cream Apply 1 application topically as needed for dry skin.   Yes [provider]  triamterene-hydrochlorothiazide (MAXZIDE) 75-50 MG per tablet Take 1  tablet by mouth daily.   Yes [provider]  amoxicillin-clavulanate (AUGMENTIN) 875-125 MG tablet Take 1 tablet by mouth every 12 (twelve) hours. 02/10/21   Petrucelli, Samantha R, PA-C  lisinopril (ZESTRIL) 20 MG tablet Take 1 tablet (20 mg total) by mouth daily. Patient not taking: Reported on 02/18/2021 01/23/21 04/23/21  Mariel Aloe, MD  potassium chloride SA (KLOR-CON) 20 MEQ tablet Take 1 tablet (20 mEq total) by mouth 2 (two) times daily. Patient not taking: No sig reported 02/10/21   Petrucelli, Samantha R, PA-C  rosuvastatin (CRESTOR) 40 MG tablet Take 1 tablet (40 mg total) by mouth daily. Patient not taking: Reported on 02/18/2021 01/23/21 04/23/21  Mariel Aloe, MD   Physical Exam: Vitals:   02/18/21 2100 02/18/21 2103 02/19/21 0730 02/19/21 1240  BP: (!) 174/87  Marland Kitchen)  181/82 (!) 174/74  Pulse: 80 84 83 74  Resp: 16  18 18   Temp:      TempSrc:      SpO2: 98% 92% 100% 97%  Weight:      Height:       Constitutional: Chronically ill-appearing.  NAD, calm, comfortable Eyes: PERRL, lids and conjunctivae normal ENMT: Left ear canal cerumen impaction.  Mucous membranes are moist. Posterior pharynx clear of any exudate or lesions. Neck: normal, supple, no masses, no thyromegaly Respiratory: Clear to auscultation bilaterally, no wheezing, no crackles. Normal respiratory effort. No accessory muscle use.  Cardiovascular: Regular rate and rhythm, no murmurs / rubs / gallops. No extremity edema. 2+ pedal pulses. No carotid bruits.  Abdomen: Obese, no distention.  Bowel sounds positive. Soft, no tenderness, no masses palpated. No hepatosplenomegaly.  Musculoskeletal: Moderate generalized weakness.  No clubbing / cyanosis. Good ROM, no contractures. Normal muscle tone.  Skin: Bilateral lower extremity edema, erythema, calor and TTP.  Please see pictures below. Neurologic: CN 2-12 grossly intact. Sensation intact, DTR normal. Strength 5/5 in all 4.  Psychiatric: Normal judgment  and insight. Alert and oriented x 3. Normal mood.       Labs on Admission: I have personally reviewed following labs and imaging studies  CBC: Recent Labs  Lab 02/18/21 0930  WBC 13.9*  NEUTROABS 12.6*  HGB 12.2  HCT 36.6  MCV 85.3  PLT 017   Basic Metabolic Panel: Recent Labs  Lab 02/18/21 0930  NA 135  K 3.4*  CL 100  CO2 25  GLUCOSE 231*  BUN 11  CREATININE 0.68  CALCIUM 8.8*   GFR: Estimated Creatinine Clearance: 77.5 mL/min (by C-G formula based on SCr of 0.68 mg/dL).  Liver Function Tests: No results for input(s): AST, ALT, ALKPHOS, BILITOT, PROT, ALBUMIN in the last 168 hours.  Radiological Exams on Admission: DG Tibia/Fibula Left  Result Date: 02/19/2021 CLINICAL DATA:  Chronic swelling. Warmth of the distal left lower extremity EXAM: LEFT TIBIA AND FIBULA - 2 VIEW COMPARISON:  None. FINDINGS: No acute fracture or dislocation. No aggressive osseous lesion. Normal alignment. 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 Mild osteoarthritis of the lateral femorotibial compartment. Soft tissue edema throughout the left lower leg. No radiopaque foreign body or soft tissue emphysema. IMPRESSION: Soft tissue edema throughout the left lower leg as can be seen with fluid overload, venous insufficiency, or cellulitis. Electronically Signed   By: Kathreen Devoid M.D.   On: 02/19/2021 08:27   DG Chest Portable 1 View  Result Date: 02/18/2021 CLINICAL DATA:  cough, fever EXAM: PORTABLE CHEST 1 VIEW COMPARISON:  February 10, 2021. FINDINGS: Borderline enlargement of the cardiac silhouette, similar. Both lungs are clear. No visible pleural effusions or pneumothorax. No acute osseous abnormality. IMPRESSION: No evidence of acute cardiopulmonary disease. Electronically Signed   By: Margaretha Sheffield M.D.   On: 02/18/2021 09:24   VAS [place] LOWER EXTREMITY VENOUS (DVT) (ONLY [facility] & [facility])  Result Date: 02/19/2021  Lower Venous DVT Study Patient Name:  LIAHNA BRICKNER   Date of Exam:   02/19/2021 Medical Rec #: [id number]        Accession #:    [id number] Date of Birth: 1955-11-06         Patient Gender: F Patient Age:   52 years Exam Location:  [facility] Procedure:      VAS [place] LOWER EXTREMITY VENOUS (DVT) Referring Phys: Thurmond Butts DIXON --------------------------------------------------------------------------------  Indications: Pain, Swelling, and Erythema. Other Indications: Patient  with history of cellulitis. Limitations: Poor ultrasound/tissue interface. Comparison Study: Previous examon 08/03/2019 was negative for DVT Performing Technologist: Rogelia Rohrer RVT, RDMS  Examination Guidelines: A complete evaluation includes B-mode imaging, spectral Doppler, color Doppler, and power Doppler as needed of all accessible portions of each vessel. Bilateral testing is considered an integral part of a complete examination. Limited examinations for reoccurring indications may be performed as noted. The reflux portion of the exam is performed with the patient in reverse Trendelenburg.  +-----+---------------+---------+-----------+----------+--------------+ RIGHTCompressibilityPhasicitySpontaneityPropertiesThrombus Aging +-----+---------------+---------+-----------+----------+--------------+ CFV  Full           Yes      Yes                                 +-----+---------------+---------+-----------+----------+--------------+   +---------+---------------+---------+-----------+----------+-------------------+ LEFT     CompressibilityPhasicitySpontaneityPropertiesThrombus Aging      +---------+---------------+---------+-----------+----------+-------------------+ CFV      Full           Yes      Yes                                      +---------+---------------+---------+-----------+----------+-------------------+ SFJ      Full                                                              +---------+---------------+---------+-----------+----------+-------------------+ FV Prox  Full           Yes      Yes                                      +---------+---------------+---------+-----------+----------+-------------------+ FV Mid   Full           Yes      Yes                                      +---------+---------------+---------+-----------+----------+-------------------+ FV DistalFull           Yes      Yes                                      +---------+---------------+---------+-----------+----------+-------------------+ PFV      Full                                                             +---------+---------------+---------+-----------+----------+-------------------+ POP      Full           Yes      Yes                                      +---------+---------------+---------+-----------+----------+-------------------+ PTV  Full                                                             +---------+---------------+---------+-----------+----------+-------------------+ PERO     Full                                         Not well visualized +---------+---------------+---------+-----------+----------+-------------------+     Summary: RIGHT: - No evidence of common femoral vein obstruction. - Ultrasound characteristics of enlarged lymph nodes are noted in the groin.  LEFT: - There is no evidence of deep vein thrombosis in the lower extremity. - There is no evidence of superficial venous thrombosis.  - No cystic structure found in the popliteal fossa. - Ultrasound characteristics of enlarged lymph nodes noted in the groin.  *See table(s) above for measurements and observations. Electronically signed by Deitra Mayo MD on 02/19/2021 at 9:28:06 AM.    Final     EKG: Independently reviewed.   Assessment/Plan Principal Problem:   Cellulitis of left lower extremity Admit to telemetry/inpatient. Analgesics as needed. She received  dalbavancin in the ED. Started on vancomycin per pharmacy. This was held after discussion with ID. ID will be consulting later today on the patient. Follow-up blood culture and sensitivity.  Active Problems:   Stasis dermatitis of both legs Consult wound care.    GERD (gastroesophageal reflux disease) Famotidine 20 mg p.o. daily.    Essential hypertension Continue lisinopril 10 mg p.o. daily. Continue metoprolol succinate 50 mg p.o. daily. Monitor BP, HR, renal function and electrolytes.    Type II diabetes mellitus (HCC) Carbohydrate modified diet. Continue glimepiride 4 mg p.o. daily. Continue Prandin 1 mg p.o. twice daily. Poor candidate for self insulin administration.    Chronic diastolic CHF (congestive heart failure) (HCC) No signs of decompensation. Continue lisinopril, metoprolol and nitrates.    Hypokalemia Replacing. Follow-up potassium level.     DVT prophylaxis: Lovenox SQ. Code Status:   Full code. Family Communication:   Disposition Plan:   Patient is from:  Home.  Anticipated DC to:  Home.  Anticipated DC date:  02/21/2021.  Anticipated DC barriers: Clinical status.  Consults called:  Infectious diseases (Dr. Jerilynn Mages.  Candiss Norse). Admission status:  Inpatient/telemetry.   Severity of Illness: High severity after presenting with fever and progressively worse erythema, edema and tenderness of her left pretibial area.  Patient will be admitted for cellulitis treatment for 2 to 3 days.  Reubin Milan MD [facility]  How to contact the [facility] Attending or Consulting provider [place] or covering provider during after hours [place], for this patient?   Check the care team in [facility] and look for a) attending/consulting [facility] provider listed and b) the [facility] team listed Log into [email or web address] and use [place]'s universal password to access. If you do not have the password, please contact the hospital operator. Locate the [facility] provider you are looking for under  [facility] and page to a number that you can be directly reached. If you still have difficulty reaching the provider, please page the [facility] (Director on Call) for the Hospitalists listed on amion for assistance.  02/19/2021, 1:23 PM   This document was prepared using Dragon  voice recognition software may contain some unintended transcription errors.

## 2021-02-19 NOTE — ED Provider Notes (Signed)
  Physical Exam  BP (!) 181/82 (BP Location: Left Arm)   Pulse 83   Temp 98.4 F (36.9 C) (Oral)   Resp 18   Ht 5\' 6"  (1.676 m)   Wt 90.7 kg   SpO2 100%   BMI 32.28 kg/m   Physical Exam Vitals and nursing note reviewed.  Constitutional:      General: She is not in acute distress.    Appearance: She is well-developed.  HENT:     Head: Normocephalic and atraumatic.  Eyes:     Conjunctiva/sclera: Conjunctivae normal.  Cardiovascular:     Rate and Rhythm: Normal rate and regular rhythm.     Heart sounds: No murmur heard. Pulmonary:     Effort: Pulmonary effort is normal. No respiratory distress.     Breath sounds: Normal breath sounds.  Abdominal:     Palpations: Abdomen is soft.     Tenderness: There is no abdominal tenderness.  Musculoskeletal:     Cervical back: Neck supple.  Skin:    General: Skin is warm and dry.     Comments: Asymmetric left lower extremity swelling and erythema tender to palpation  Neurological:     Mental Status: She is alert.       ED Course/Procedures   Clinical Course as of 02/19/21 0854  Tue Feb 19, 2021  0707 Pending doppler [MK]    Clinical Course User Index [MK] Tryton Bodi, Debe Coder, MD    Procedures  MDM  Patient received in handoff.  Left lower extremity cellulitis.  Received dalbavancin in the lobby and was pending ultrasound Dopplers.  Dopplers are negative, but on review of previous images of the patient's left lower extremity, this instance of swelling and erythema seems to be significantly worse than previous presentations.  Patient was reportedly febrile at home to 102.0 and arrives with a leukocytosis.  I do not have a better explanation for the patient's fever and leukocytosis in the setting of what appears to be an asymmetric cellulitis that has failed dalbavancin on reevaluation.  Patient started on Vanco and will require hospital admission.       Teressa Lower, MD 02/19/21 [phone]

## 2021-02-19 NOTE — ED Notes (Signed)
DG Tibia/Fibula Left order was accidentally clicked off by this Probation officer. The order has not been completed at the time of this note.

## 2021-02-20 ENCOUNTER — Other Ambulatory Visit (HOSPITAL_COMMUNITY): Payer: Self-pay

## 2021-02-20 LAB — CBC
HCT: 34 % — ABNORMAL LOW (ref 36.0–46.0)
Hemoglobin: 11.1 g/dL — ABNORMAL LOW (ref 12.0–15.0)
MCH: 27.8 pg (ref 26.0–34.0)
MCHC: 32.6 g/dL (ref 30.0–36.0)
MCV: 85.2 fL (ref 80.0–100.0)
Platelets: 184 10*3/uL (ref 150–400)
RBC: 3.99 MIL/uL (ref 3.87–5.11)
RDW: 14.6 % (ref 11.5–15.5)
WBC: 6 10*3/uL (ref 4.0–10.5)
nRBC: 0 % (ref 0.0–0.2)

## 2021-02-20 LAB — COMPREHENSIVE METABOLIC PANEL
ALT: 9 U/L (ref 0–44)
AST: 11 U/L — ABNORMAL LOW (ref 15–41)
Albumin: 3 g/dL — ABNORMAL LOW (ref 3.5–5.0)
Alkaline Phosphatase: 57 U/L (ref 38–126)
Anion gap: 8 (ref 5–15)
BUN: 18 mg/dL (ref 8–23)
CO2: 29 mmol/L (ref 22–32)
Calcium: 8.4 mg/dL — ABNORMAL LOW (ref 8.9–10.3)
Chloride: 99 mmol/L (ref 98–111)
Creatinine, Ser: 0.86 mg/dL (ref 0.44–1.00)
GFR, Estimated: >60 mL/min (ref >60)
Glucose, Bld: 138 mg/dL — ABNORMAL HIGH (ref 70–99)
Potassium: 3.3 mmol/L — ABNORMAL LOW (ref 3.5–5.1)
Sodium: 136 mmol/L (ref 135–145)
Total Bilirubin: 0.9 mg/dL (ref 0.3–1.2)
Total Protein: 6.7 g/dL (ref 6.5–8.1)

## 2021-02-20 LAB — GLUCOSE, CAPILLARY
Glucose-Capillary: 123 mg/dL — ABNORMAL HIGH (ref 70–99)
Glucose-Capillary: 147 mg/dL — ABNORMAL HIGH (ref 70–99)
Glucose-Capillary: 136 mg/dL — ABNORMAL HIGH (ref 70–99)
Glucose-Capillary: 168 mg/dL — ABNORMAL HIGH (ref 70–99)

## 2021-02-20 MED ORDER — CLINDAMYCIN HCL 300 MG PO CAPS
450.0000 mg | ORAL_CAPSULE | Freq: Three times a day (TID) | ORAL | Status: DC
  Administered 2021-02-20 – 2021-02-21 (×3): 450 mg via ORAL
  Filled 2021-02-20 (×5): qty 1

## 2021-02-20 MED ORDER — GLIMEPIRIDE 4 MG PO TABS
4.0000 mg | ORAL_TABLET | Freq: Every day | ORAL | Status: DC
  Administered 2021-02-21: 4 mg via ORAL
  Filled 2021-02-20: qty 1

## 2021-02-20 MED ORDER — POTASSIUM CHLORIDE 20 MEQ PO PACK
40.0000 meq | PACK | Freq: Once | ORAL | Status: AC
  Administered 2021-02-20: 40 meq via ORAL
  Filled 2021-02-20: qty 2

## 2021-02-20 NOTE — TOC Benefit Eligibility Note (Signed)
Patient Teacher, English as a foreign language completed.    The patient is currently admitted and upon discharge could be taking Clindamycin 150 mg capsules.  The current 2 day co-pay is, $11.52.   The patient is insured through [place], [place] Patient Advocate Specialist [place] Patient Advocate Team Direct Number: [phone]  Fax: [phone]

## 2021-02-20 NOTE — Evaluation (Signed)
Physical Therapy Evaluation Patient Details Name: Amanda Butler MRN: [id number] DOB: 1955/10/28 Today's Date: 02/20/2021  History of Present Illness  65 yo female admitted with LE cellulitis. hx of obesity, asthma, DM, OA, venous stasis ulcers, cellulitis, CHF, RBBB  Clinical Impression  On eval, pt was Min guard assist for mobility. She walked ~120 feet with a RW. Minimal pain with activity. She tolerated ambulation distance well. Discussed d/c plan-pt plans to return home where she lives alone. She politely declines any PT f/u after discharge. Will follow and progress activity as tolerate during hospital stay.       Recommendations for follow up therapy are one component of a multi-disciplinary discharge planning process, led by the attending physician.  Recommendations may be updated based on patient status, additional functional criteria and insurance authorization.  Follow Up Recommendations No PT follow up (pt politely declined f/u)    Assistance Recommended at Discharge None  Functional Status Assessment Patient has had a recent decline in their functional status and demonstrates the ability to make significant improvements in function in a reasonable and predictable amount of time.  Equipment Recommendations  None recommended by PT    Recommendations for Other Services       Precautions / Restrictions Precautions Precautions: Fall Restrictions Weight Bearing Restrictions: No      Mobility  Bed Mobility Overal bed mobility: Modified Independent                  Transfers Overall transfer level: Needs assistance Equipment used: Rolling walker (2 wheels) Transfers: Sit to/from Stand Sit to Stand: Supervision           General transfer comment: for safety.    Ambulation/Gait Ambulation/Gait assistance: Min guard Gait Distance (Feet): 120 Feet Assistive device: Rolling walker (2 wheels) Gait Pattern/deviations: Step-through pattern;Decreased stride  length       General Gait Details: Min guard for safety.  Stairs            Wheelchair Mobility    Modified Rankin (Stroke Patients Only)       Balance Overall balance assessment: Needs assistance         Standing balance support: Bilateral upper extremity supported Standing balance-Leahy Scale: Poor                               Pertinent Vitals/Pain Pain Assessment: Faces Faces Pain Scale: Hurts a little bit Pain Location: LEs Pain Descriptors / Indicators: Discomfort;Sore Pain Intervention(s): Limited activity within patient's tolerance;Monitored during session    [place] expects to be discharged to:: Private residence Living Arrangements: Alone Available Help at Discharge: Family;Available PRN/intermittently Type of Home: House Home Access: Ramped entrance       Home Layout: One level Home Equipment: Conservation officer, nature (2 wheels);Wheelchair - manual;Cane - single point;BSC/3in1;Shower seat;Grab bars - tub/shower      Prior Function Prior Level of Function : Independent/Modified Independent             Mobility Comments: uses cane vs RW       Hand Dominance        Extremity/Trunk Assessment   Upper Extremity Assessment Upper Extremity Assessment: Overall WFL for tasks assessed    Lower Extremity Assessment Lower Extremity Assessment: Generalized weakness    Cervical / Trunk Assessment Cervical / Trunk Assessment: Normal  Communication   Communication: No difficulties  Cognition Arousal/Alertness: Awake/alert Behavior During Therapy: WFL for tasks assessed/performed Overall  Cognitive Status: Within Functional Limits for tasks assessed                                          General Comments      Exercises     Assessment/Plan    PT Assessment Patient needs continued PT services  PT Problem List Decreased balance;Decreased knowledge of use of DME;Pain;Decreased mobility        PT Treatment Interventions DME instruction;Gait training;Therapeutic activities;Therapeutic exercise;Patient/family education;Functional mobility training;Balance training    PT Goals (Current goals can be found in the Care Plan section)  Acute Rehab PT Goals Patient Stated Goal: regain PLOF. home soon PT Goal Formulation: With patient Time For Goal Achievement: 03/06/21 Potential to Achieve Goals: Good    Frequency Min 3X/week   Barriers to discharge        Co-evaluation               AM-PAC PT "6 Clicks" Mobility  Outcome Measure Help needed turning from your back to your side while in a flat bed without using bedrails?: None Help needed moving from lying on your back to sitting on the side of a flat bed without using bedrails?: None Help needed moving to and from a bed to a chair (including a wheelchair)?: A Little Help needed standing up from a chair using your arms (e.g., wheelchair or bedside chair)?: A Little Help needed to walk in hospital room?: A Little Help needed climbing 3-5 steps with a railing? : A Little 6 Click Score: 20    End of Session Equipment Utilized During Treatment: Gait belt Activity Tolerance: Patient tolerated treatment well Patient left: in bed;with call bell/phone within reach;with bed alarm set   PT Visit Diagnosis: Unsteadiness on feet (R26.81);History of falling (Z91.81);Difficulty in walking, not elsewhere classified (R26.2)    Time: 3086-5784 PT Time Calculation (min) (ACUTE ONLY): 27 min   Charges:   PT Evaluation $PT Eval Moderate Complexity: 1 Mod PT Treatments $Gait Training: 8-22 mins           Doreatha Massed, PT Acute Rehabilitation  Office: [phone] Pager: [phone]

## 2021-02-20 NOTE — TOC Benefit Eligibility Note (Signed)
Transition of Care [facility]) Benefit Eligibility Note    Patient Details  Name: Amanda Butler MRN: [id number] Date of Birth: 02-12-1956   Medication/Dose: Clindamycin ( Cleocin) capsule 455m 3x daily 6 doses  Covered?:  (1572mon formulary 45070mequires prior auth)  Tier: Other  Prescription Coverage Preferred Pharmacy: local  Spoke with Person/Company/Phone Number:: [facility] [id number]-Pay: requires prior auth [phone] Approval: Yes (prior auth [phone]: Met       FulKerin Salenone Number: 02/20/2021, 11:46 AM

## 2021-02-20 NOTE — TOC Initial Note (Signed)
Transition of Care [facility]) - Initial/Assessment Note    Patient Details  Name: Amanda Butler MRN: [id number] Date of Birth: April 23, 1955  Transition [facility]) CM/SW Contact:    Latreshia Beauchaine, Marjie Skiff, RN Phone Number: 02/20/2021, 1:28 PM  Clinical Narrative:                 Spoke with pt at bedside as MD was informed she had needs for assistance with dressing changes and difficulty affording copay for meds. When I spoke with the pt. She said she doesn't want anyone to help with her dressing changes as she has been doing it for years. She said she has no problem reaching her legs to do them herself and she has supplies at home. She also said she should not have trouble getting her meds this time. SHe only had $10 last admission. This admission she can afford her medication copay as long as it is $50 or less. Pt states she has a RW, BSC, Tub bench at home currently. TOC has identified no additional home needs.       Activities of Daily Living Home Assistive Devices/Equipment: Cane (specify quad or straight), Walker (specify type), Eyeglasses, Dentures (specify type), Shower chair with back, Other (Comment), CBG Meter (front wheeled walker, single point cane, walk-in shower, standard height toilet) ADL Screening (condition at time of admission) Patient's cognitive ability adequate to safely complete daily activities?: Yes Is the patient deaf or have difficulty hearing?: No Does the patient have difficulty seeing, even when wearing glasses/contacts?: No Does the patient have difficulty concentrating, remembering, or making decisions?: No Patient able to express need for assistance with ADLs?: Yes Does the patient have difficulty dressing or bathing?: No Independently performs ADLs?: No Communication: Independent Dressing (OT): Independent Grooming: Independent Feeding: Independent Bathing: Independent Toileting: Independent with device (comment) In/Out Bed: Independent Walks in Home:  Independent with device (comment) (uses a walker when out and about and a cane inside) Does the patient have difficulty walking or climbing stairs?: Yes Weakness of Legs: Both Weakness of Arms/Hands: None       Admission diagnosis:  Cellulitis [L03.90] Cellulitis of left leg [L03.116] Cellulitis of left lower extremity [L03.116] Pain and swelling of lower leg, left [K35.465, M79.89] Pain of maxillary sinus [J34.89] Patient Active Problem List   Diagnosis Date Noted   Cellulitis of left lower extremity 02/19/2021   Acute lower UTI 01/21/2021   Hypokalemia 01/21/2021   Hypertensive urgency 01/21/2021   Ulcers of both lower extremities, limited to breakdown of skin ([place])    Cellulitis of left leg 11/16/2018   Accelerated hypertension 09/19/2018   Pressure injury of skin 02/20/2018   Cellulitis 10/02/2017   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   Closed nondisplaced fracture of greater tuberosity of left humerus 04/23/2017   Type II diabetes mellitus ([place])    High cholesterol    Morbid obesity ([place])    Chronic diastolic CHF (congestive heart failure) ([place])    Midsternal chest pain    Pain in the chest    Essential hypertension    Morbid obesity due to excess calories ([place])    Chest pain 09/13/2015   Bilateral lower extremity edema 09/13/2015   Diabetes mellitus type 2, diet-controlled ([place]) 09/13/2015   Obesity 09/13/2015   Total bilirubin, elevated 09/13/2015   HTN (hypertension) 09/13/2015   Stasis dermatitis of both legs 09/13/2015   Left leg cellulitis 09/13/2015   RBBB 09/13/2015   Asthma 09/13/2015   GERD (  gastroesophageal reflux disease) 09/13/2015   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   PCP:  Gaynelle Arabian, MD Pharmacy:   [facility] ([place], [address] [place] [address] Phone: [phone] Fax: [place] [place] [address] Phone: [phone] Fax: [phone]     Social Determinants of Health (SDOH) Interventions    Readmission Risk Interventions No flowsheet data found.

## 2021-02-20 NOTE — Progress Notes (Signed)
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  She presented with history of fever and left lower extremity edema and erythema.  On arrival, patient was afebrile with WBC 13.9K.  She was given 1 dose of dalbavancin.  ID consulted for antibiotic recommendations. #Left lower extremity cellulitis in the setting of venous stasis -SP dalbavancin x1 dose -No dvt seen on u/s on 11/8   Recommendations:  -Transition IV clindamycin to PO end date on 11/10 to complete 72 hours of therapy for antitoxin effect - Does not need additional antibiotics as dalbavancin provides 2 weeks of coverage which sufficient for skin soft tissue infection. Her LLE edema/erythema has significantly improved since admission(receded from above the knee to 3 inches below the knee).  - Follow blood cultures    ID will sign off  Microbiology:  Antibiotics: Amoxicillin 11/7 Dalbavancin 11/78 Cultures: Blood Cx 11/8    Review of Systems: Review of Systems  Constitutional: Negative.   HENT: Negative.    Eyes: Negative.   Respiratory: Negative.    Cardiovascular: Negative.   Gastrointestinal: Negative.   Genitourinary: Negative.   Musculoskeletal: Negative.   Skin:        LLE erythema    Scheduled Meds:  carbamide peroxide  10 drop Left EAR BID   clindamycin  450 mg Oral Q8H   enoxaparin (LOVENOX) injection  40 mg Subcutaneous Q24H   fenofibrate  54 mg Oral Daily   [START ON  02/21/2021] glimepiride  4 mg Oral Q breakfast   insulin aspart  0-15 Units Subcutaneous TID WC   isosorbide mononitrate  30 mg Oral Daily   lisinopril  10 mg Oral Daily   metoprolol succinate  50 mg Oral Daily   potassium chloride  40 mEq Oral Once   repaglinide  1 mg Oral BID AC   rosuvastatin  20 mg Oral Daily   triamterene-hydrochlorothiazide  1 tablet Oral Daily   Continuous Infusions: PRN Meds:.acetaminophen **OR** acetaminophen, albuterol, ondansetron **OR** ondansetron (ZOFRAN) IV, oxyCODONE Allergies  Allergen Reactions   Bee Venom Anaphylaxis   Iodine Shortness Of Breath and Rash   Shellfish Allergy Anaphylaxis   Actos [Pioglitazone] Other (See Comments)    Erratic heartbeat   Aspirin Hives   Celecoxib     Other reaction(s): Unknown   Latex Hives   Metformin And Related Other (See Comments)    Patient reports flu-like symptoms and fatigue   Morphine And Related Other (See Comments)    Childhood allergy (65 years old) , pt got sicker , temp went up, dr gave her the wrong medication   Nsaids Hives   Other     Xray dye   Ciprofloxacin Palpitations    Joint pain    OBJECTIVE: Vitals:   02/20/21 0001 02/20/21 0437 02/20/21 0600 02/20/21 0800  BP: (!) 99/53 (!) 102/52  130/72  Pulse: 63 61  66  Resp: 18 16  18   Temp: 98.8 F (37.1 C) 98.8  F (37.1 C)  98.5 F (36.9 C)  TempSrc: Oral Oral  Oral  SpO2: 97% 93%  98%  Weight:   96 kg   Height:       Body mass index is 34.15 kg/m.  Physical Exam Constitutional:      Appearance: Normal appearance.  HENT:     Head: Normocephalic and atraumatic.     Right Ear: Tympanic membrane normal.     Left Ear: Tympanic membrane normal.     Nose: Nose normal.     Mouth/Throat:     Mouth: Mucous membranes are moist.  Eyes:     Extraocular Movements: Extraocular movements intact.     Conjunctiva/sclera: Conjunctivae normal.     Pupils: Pupils are equal, round, and reactive to light.  Cardiovascular:     Rate and  Rhythm: Normal rate and regular rhythm.     Heart sounds: No murmur heard.   No friction rub. No gallop.  Pulmonary:     Effort: Pulmonary effort is normal.     Breath sounds: Normal breath sounds.  Abdominal:     General: Abdomen is flat.     Palpations: Abdomen is soft.  Musculoskeletal:        General: Normal range of motion.  Skin:    General: Skin is warm and dry.     Comments: LLE edema and erythema receded to 3 inches below the knee.   Neurological:     General: No focal deficit present.     Mental Status: She is alert and oriented to person, place, and time.  Psychiatric:        Mood and Affect: Mood normal.      Lab Results Lab Results  Component Value Date   WBC 6.0 02/20/2021   HGB 11.1 (L) 02/20/2021   HCT 34.0 (L) 02/20/2021   MCV 85.2 02/20/2021   PLT 184 02/20/2021    Lab Results  Component Value Date   CREATININE 0.86 02/20/2021   BUN 18 02/20/2021   NA 136 02/20/2021   K 3.3 (L) 02/20/2021   CL 99 02/20/2021   CO2 29 02/20/2021    Lab Results  Component Value Date   ALT 9 02/20/2021   AST 11 (L) 02/20/2021   ALKPHOS 57 02/20/2021   BILITOT 0.9 02/20/2021        Laurice Record, [place] for Infectious [facility] 02/20/2021, 11:27 AM

## 2021-02-20 NOTE — Consult Note (Addendum)
[facility] Nurse Consult Note: Reason for Consult: Consult requested for bilat legs.  Performed remotely after review of progress notes and photos in the EMR.  Pt is familiar to [facility] team from previous admission on 10/11. She has chronic lymphedema, generalized erythremia to bilat feet and legs, and scattered areas of full thickness wounds, which are red and moist. Pt is on systemic antibiotics for cellulitis. Dressing procedure/placement/frequency: Topical treatment orders provided for bedside nurses to perform as follows to promote drying and healing and provide light compression: Apply xeroform gauze to open wounds on bilat legs Q day, then cover with ABD pads and kerlex, beginning just behind toes to below knees, then ace wrap in the same manner.  Please re-consult if further assistance is needed.  Thank-you,  Julien Girt MSN, [place], [place], [place], [place]

## 2021-02-20 NOTE — Progress Notes (Signed)
PROGRESS NOTE    Amanda Butler  FUX:[id number] DOB: [birth year] DOA: 02/18/2021 PCP: Gaynelle Arabian, MD    Chief Complaint  Patient presents with   Fever    Brief Narrative:  venous stasis ulcers with history of multiple episodes of cellulitis who is coming to the emergency department from his coming to the emergency department due to fever and flulike symptoms.  She reported having a fever of 102F the previous night and 100.84F yesterday morning.  Her left lower extremity is very edematous and erythematous when compared to baseline.  She complains of fatigue, weakness and lightheadedness.    Subjective: No Fever Erythema is improving   states could not afford abx that ED prescribed her, this is her 3rd ED visits and second hospitalization in the last months,  states can not get in to see pcp, the next available appointment with pcp is 11/26 States can not get in to see wound care until December    Assessment & Plan:   Principal Problem:   Cellulitis of left lower extremity Active Problems:   Stasis dermatitis of both legs   GERD (gastroesophageal reflux disease)   Essential hypertension   Type II diabetes mellitus ([place])   Chronic diastolic CHF (congestive heart failure) (HCC)   Hypokalemia  Left lower extremity cellulitis  -h/o venous stasis ulcers with multiple episodes cellulitis, MRSA from LLE wound Cx on 09/19/2018,  -Reported fever 102 at home -No fever on arrival, WBC 13.9 on arrival -Venous ultrasound negative for DVT on 11/8 -Blood culture obtained, in process -Seen by ID, given dalbavancin, also started on IV clindamycin for antitoxin effect -Seen by wound care: Apply xeroform gauze to open wounds on bilat legs Q day, then cover with ABD pads and kerlex, beginning just behind toes to below knees, then ace wrap in the same manner.  -Need ID and wound care follow-up   Hypokalemia Replace K, check mag  Chronic diastolic  CHF/hypertension/hyperlipidemia/noninsulin-dependent type 2 diabetes -Appears stable at baseline, continue home meds  Nutritional Assessment: The patient's BMI is: Body mass index is 34.15 kg/m..  FTT: Pt eval    Unresulted Labs (From admission, onward)     Start     Ordered   02/26/21 0500  Creatinine, serum  (enoxaparin (LOVENOX)    CrCl >/= 30 ml/min)  Weekly,   R     Comments: while on enoxaparin therapy    02/19/21 1100              DVT prophylaxis: enoxaparin (LOVENOX) injection 40 mg Start: 02/19/21 1200   Code Status:full Family Communication: she lives by herself, her daughter is in a nursing home Disposition:   Status is: Inpatient  Dispo: The patient is from: home              Anticipated d/c is to: home with home health?              Anticipated d/c date is: tomorrow                Consultants:  ID  Procedures:  none  Antimicrobials:    Anti-infectives (From admission, onward)    Start     Dose/Rate Route Frequency Ordered Stop   02/20/21 1400  clindamycin (CLEOCIN) capsule 450 mg        450 mg Oral Every 8 hours 02/20/21 0951 02/22/21 1359   02/19/21 1500  clindamycin (CLEOCIN) IVPB 900 mg  Status:  Discontinued        900  mg 100 mL/hr over 30 Minutes Intravenous Every 8 hours 02/19/21 1415 02/20/21 0951   02/19/21 0130  dalbavancin (DALVANCE) 1,500 mg in dextrose 5 % 500 mL IVPB        1,500 mg 967.7 mL/hr over 31 Minutes Intravenous Once 02/19/21 0121 02/19/21 0331   02/18/21 2330  amoxicillin-clavulanate (AUGMENTIN) 875-125 MG per tablet 1 tablet        1 tablet Oral  Once 02/18/21 2319 02/18/21 2329          Objective: Vitals:   02/20/21 0600 02/20/21 0800 02/20/21 1342 02/20/21 1930  BP:  130/72 (!) 147/86 (!) 113/56  Pulse:  66 64 63  Resp:  18 18 20   Temp:  98.5 F (36.9 C) 98.5 F (36.9 C) 98.8 F (37.1 C)  TempSrc:  Oral Oral Oral  SpO2:  98% 99% 97%  Weight: 96 kg     Height:        Intake/Output Summary (Last  24 hours) at 02/20/2021 2219 Last data filed at 02/20/2021 2056 Gross per 24 hour  Intake 650 ml  Output 200 ml  Net 450 ml   Filed Weights   02/18/21 0948 02/20/21 0600  Weight: 90.7 kg 96 kg    Examination:  General exam: alert, awake, communicative,calm, NAD, speech is circumstantial  Respiratory system: Clear to auscultation. Respiratory effort normal. Cardiovascular system:  RRR.  Gastrointestinal system: Abdomen is nondistended, soft and nontender.  Normal bowel sounds heard. Central nervous system: Alert and oriented. No focal neurological deficits. Extremities:  bilateral lower extremity less erythema , currently on significant edema Skin: No rashes, lesions or ulcers Psychiatry: Judgement and insight appear normal. Mood & affect appropriate.     Data Reviewed: I have personally reviewed following labs and imaging studies  CBC: Recent Labs  Lab 02/18/21 0930 02/20/21 0535  WBC 13.9* 6.0  NEUTROABS 12.6*  --   HGB 12.2 11.1*  HCT 36.6 34.0*  MCV 85.3 85.2  PLT 202 092    Basic Metabolic Panel: Recent Labs  Lab 02/18/21 0930 02/20/21 0535  NA 135 136  K 3.4* 3.3*  CL 100 99  CO2 25 29  GLUCOSE 231* 138*  BUN 11 18  CREATININE 0.68 0.86  CALCIUM 8.8* 8.4*    GFR: Estimated Creatinine Clearance: 74.2 mL/min (by C-G formula based on SCr of 0.86 mg/dL).  Liver Function Tests: Recent Labs  Lab 02/20/21 0535  AST 11*  ALT 9  ALKPHOS 57  BILITOT 0.9  PROT 6.7  ALBUMIN 3.0*    CBG: Recent Labs  Lab 02/19/21 2059 02/20/21 0802 02/20/21 1205 02/20/21 1632 02/20/21 1930  GLUCAP 156* 123* 147* 136* 168*     Recent Results (from the past 240 hour(s))  Resp Panel by RT-PCR (Flu A&B, Covid) Nasopharyngeal Swab     Status: None   Collection Time: 02/18/21  9:26 AM   Specimen: Nasopharyngeal Swab; Nasopharyngeal(NP) swabs in vial transport medium  Result Value Ref Range Status   SARS Coronavirus 2 by RT PCR NEGATIVE NEGATIVE Final    Comment:  (NOTE) SARS-CoV-2 target nucleic acids are NOT DETECTED.  The SARS-CoV-2 RNA is generally detectable in upper respiratory specimens during the acute phase of infection. The lowest concentration of SARS-CoV-2 viral copies this assay can detect is 138 copies/mL. A negative result does not preclude SARS-Cov-2 infection and should not be used as the sole basis for treatment or other patient management decisions. A negative result may occur with  improper specimen collection/handling, submission of  specimen other than nasopharyngeal swab, presence of viral mutation(s) within the areas targeted by this assay, and inadequate number of viral copies(<138 copies/mL). A negative result must be combined with clinical observations, patient history, and epidemiological information. The expected result is Negative.  Fact Sheet for Patients:  EntrepreneurPulse.com.au  Fact Sheet for Healthcare Providers:  IncredibleEmployment.be  This test is no t yet approved or cleared by the [place] FDA and  has been authorized for detection and/or diagnosis of SARS-CoV-2 by FDA under an Emergency Use Authorization (EUA). This EUA will remain  in effect (meaning this test can be used) for the duration of the COVID-19 declaration under Section 564(b)(1) of the Act, 21 U.S.C.section 360bbb-3(b)(1), unless the authorization is terminated  or revoked sooner.       Influenza A by PCR NEGATIVE NEGATIVE Final   Influenza B by PCR NEGATIVE NEGATIVE Final    Comment: (NOTE) The Xpert Xpress SARS-CoV-2/FLU/RSV plus assay is intended as an aid in the diagnosis of influenza from Nasopharyngeal swab specimens and should not be used as a sole basis for treatment. Nasal washings and aspirates are unacceptable for Xpert Xpress SARS-CoV-2/FLU/RSV testing.  Fact Sheet for Patients: EntrepreneurPulse.com.au  Fact Sheet for Healthcare  Providers: IncredibleEmployment.be  This test is not yet approved or cleared by the [place] FDA and has been authorized for detection and/or diagnosis of SARS-CoV-2 by FDA under an Emergency Use Authorization (EUA). This EUA will remain in effect (meaning this test can be used) for the duration of the COVID-19 declaration under Section 564(b)(1) of the Act, 21 U.S.C. section 360bbb-3(b)(1), unless the authorization is terminated or revoked.  Performed at [facility], [place] [address], [place], [place] [address]   Blood culture (routine x 2)     Status: None (Preliminary result)   Collection Time: 02/19/21  1:19 AM   Specimen: BLOOD  Result Value Ref Range Status   Specimen Description   Final    BLOOD BLOOD RIGHT FOREARM Performed at [facility] [address], [place], [place] [address]    Special Requests   Final    BOTTLES DRAWN AEROBIC AND ANAEROBIC Blood Culture results may not be optimal due to an inadequate volume of blood received in culture bottles Performed at [facility] [address], [place], [place] [address]    Culture   Final    NO GROWTH 1 DAY Performed at [facility], [place] [address], [place], [place] [address]    Report Status PENDING  Incomplete  Blood culture (routine x 2)     Status: None (Preliminary result)   Collection Time: 02/19/21  1:19 AM   Specimen: BLOOD  Result Value Ref Range Status   Specimen Description   Final    BLOOD BLOOD RIGHT FOREARM Performed at [facility] [address], [place], [place] [address]    Special Requests   Final    BOTTLES DRAWN AEROBIC AND ANAEROBIC Blood Culture adequate volume Performed at [facility] [address], [place], [place] [address]    Culture   Final    NO GROWTH 1 DAY Performed at [facility], [place] [address], [place], [place] [address]    Report Status PENDING   Incomplete         Radiology Studies: DG Tibia/Fibula Left  Result Date: 02/19/2021 CLINICAL DATA:  Chronic swelling. Warmth of the distal left lower extremity EXAM: LEFT TIBIA AND FIBULA - 2  VIEW COMPARISON:  None. FINDINGS: No acute fracture or dislocation. No aggressive osseous lesion. Normal alignment. 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 Mild osteoarthritis of the lateral femorotibial compartment. Soft tissue edema throughout the left lower leg. No radiopaque foreign body or soft tissue emphysema. IMPRESSION: Soft tissue edema throughout the left lower leg as can be seen with fluid overload, venous insufficiency, or cellulitis. Electronically Signed   By: Kathreen Devoid M.D.   On: 02/19/2021 08:27   VAS [place] LOWER EXTREMITY VENOUS (DVT) (ONLY [facility] & [facility])  Result Date: 02/19/2021  Lower Venous DVT Study Patient Name:  YANA SCHORR  Date of Exam:   02/19/2021 Medical Rec #: [id number]        Accession #:    [id number] Date of Birth: 09-10-1955         Patient Gender: F Patient Age:   33 years Exam Location[facility]  [facility] Procedure:      VAS [place] LOWER EXTREMITY VENOUS (DVT) Referring Phys: Thurmond Butts DIXON --------------------------------------------------------------------------------  Indications: Pain, Swelling, and Erythema. Other Indications: Patient with history of cellulitis. Limitations: Poor ultrasound/tissue interface. Comparison Study: Previous examon 08/03/2019 was negative for DVT Performing Technologist: Rogelia Rohrer RVT, RDMS  Examination Guidelines: A complete evaluation includes B-mode imaging, spectral Doppler, color Doppler, and power Doppler as needed of all accessible portions of each vessel. Bilateral testing is considered an integral part of a complete examination. Limited examinations for reoccurring indications may be performed as noted. The reflux portion of the exam is performed with the patient in reverse Trendelenburg.   +-----+---------------+---------+-----------+----------+--------------+ RIGHTCompressibilityPhasicitySpontaneityPropertiesThrombus Aging +-----+---------------+---------+-----------+----------+--------------+ CFV  Full           Yes      Yes                                 +-----+---------------+---------+-----------+----------+--------------+   +---------+---------------+---------+-----------+----------+-------------------+ LEFT     CompressibilityPhasicitySpontaneityPropertiesThrombus Aging      +---------+---------------+---------+-----------+----------+-------------------+ CFV      Full           Yes      Yes                                      +---------+---------------+---------+-----------+----------+-------------------+ SFJ      Full                                                             +---------+---------------+---------+-----------+----------+-------------------+ FV Prox  Full           Yes      Yes                                      +---------+---------------+---------+-----------+----------+-------------------+ FV Mid   Full           Yes      Yes                                      +---------+---------------+---------+-----------+----------+-------------------+ FV DistalFull  Yes      Yes                                      +---------+---------------+---------+-----------+----------+-------------------+ PFV      Full                                                             +---------+---------------+---------+-----------+----------+-------------------+ POP      Full           Yes      Yes                                      +---------+---------------+---------+-----------+----------+-------------------+ PTV      Full                                                             +---------+---------------+---------+-----------+----------+-------------------+ PERO     Full                                          Not well visualized +---------+---------------+---------+-----------+----------+-------------------+     Summary: RIGHT: - No evidence of common femoral vein obstruction. - Ultrasound characteristics of enlarged lymph nodes are noted in the groin.  LEFT: - There is no evidence of deep vein thrombosis in the lower extremity. - There is no evidence of superficial venous thrombosis.  - No cystic structure found in the popliteal fossa. - Ultrasound characteristics of enlarged lymph nodes noted in the groin.  *See table(s) above for measurements and observations. Electronically signed by Deitra Mayo MD on 02/19/2021 at 9:28:06 AM.    Final         Scheduled Meds:  carbamide peroxide  10 drop Left EAR BID   clindamycin  450 mg Oral Q8H   enoxaparin (LOVENOX) injection  40 mg Subcutaneous Q24H   fenofibrate  54 mg Oral Daily   [START ON 02/21/2021] glimepiride  4 mg Oral Q breakfast   insulin aspart  0-15 Units Subcutaneous TID WC   isosorbide mononitrate  30 mg Oral Daily   lisinopril  10 mg Oral Daily   metoprolol succinate  50 mg Oral Daily   repaglinide  1 mg Oral BID AC   rosuvastatin  20 mg Oral Daily   triamterene-hydrochlorothiazide  1 tablet Oral Daily   Continuous Infusions:   LOS: 1 day   Time spent: 56mins Greater than 50% of this time was spent in counseling, explanation of diagnosis, planning of further management, and coordination of care.   Voice Recognition Viviann Spare dictation system was used to create this note, attempts have been made to correct errors. Please contact the author with questions and/or clarifications.   Florencia Reasons, MD PhD FACP [facility]  Available via Epic secure chat 7am-7pm for nonurgent issues Please page for urgent issues To page the attending provider between 7A-7P  or the covering provider during after hours 7P-7A, please log into the web site [email or web address] and access using universal [place] password for that web site. If  you do not have the password, please call the hospital operator.    02/20/2021, 10:19 PM

## 2021-02-20 NOTE — Plan of Care (Signed)

## 2021-02-21 ENCOUNTER — Other Ambulatory Visit (HOSPITAL_COMMUNITY): Payer: Self-pay

## 2021-02-21 LAB — BASIC METABOLIC PANEL
Anion gap: 8 (ref 5–15)
BUN: 25 mg/dL — ABNORMAL HIGH (ref 8–23)
CO2: 26 mmol/L (ref 22–32)
Calcium: 8.7 mg/dL — ABNORMAL LOW (ref 8.9–10.3)
Chloride: 100 mmol/L (ref 98–111)
Creatinine, Ser: 0.99 mg/dL (ref 0.44–1.00)
GFR, Estimated: >60 mL/min (ref >60)
Glucose, Bld: 226 mg/dL — ABNORMAL HIGH (ref 70–99)
Potassium: 4.2 mmol/L (ref 3.5–5.1)
Sodium: 134 mmol/L — ABNORMAL LOW (ref 135–145)

## 2021-02-21 LAB — GLUCOSE, CAPILLARY
Glucose-Capillary: 192 mg/dL — ABNORMAL HIGH (ref 70–99)
Glucose-Capillary: 173 mg/dL — ABNORMAL HIGH (ref 70–99)

## 2021-02-21 LAB — MAGNESIUM: Magnesium: 2.3 mg/dL (ref 1.7–2.4)

## 2021-02-21 MED ORDER — CLINDAMYCIN HCL 150 MG PO CAPS
450.0000 mg | ORAL_CAPSULE | Freq: Three times a day (TID) | ORAL | 0 refills | Status: AC
  Filled 2021-02-21: qty 6, 1d supply, fill #0

## 2021-02-21 NOTE — TOC Transition Note (Signed)
Transition of Care [facility]) - CM/SW Discharge Note   Patient Details  Name: Amanda Butler MRN: [id number] Date of Birth: Oct 17, 1955  [facility] CM/SW Contact:  Lynnell Catalan, RN Phone Number: 02/21/2021, 11:15 AM   Clinical Narrative:     [name] have agreed to bring pt Clindamycin to her hospital room. RN aware.

## 2021-02-21 NOTE — Discharge Summary (Signed)
Discharge Summary  Amanda Butler UJW:[id number] DOB: 23-Oct-1955  PCP: Gaynelle Arabian, MD  Admit date: 02/18/2021 Discharge date: 02/21/2021  Time spent:  73mins  Recommendations for Outpatient Follow-up:  F/u with PCP within a week  for hospital discharge follow up, repeat cbc/bmp at follow up F/u with wound care center F/u with infectious disease on 11/14 Patient declined home health, detail please see TOC note from 11/9  Labs to follow up after discharge: final blood culture result    Discharge Diagnoses:  Active Hospital Problems   Diagnosis Date Noted   Cellulitis of left lower extremity 02/19/2021   Hypokalemia 01/21/2021   Chronic diastolic CHF (congestive heart failure) (HCC)    Type II diabetes mellitus (HCC)    Essential hypertension    GERD (gastroesophageal reflux disease) 09/13/2015   Stasis dermatitis of both legs 09/13/2015    Resolved Hospital Problems  No resolved problems to display.    Discharge Condition: stable  Diet recommendation: heart healthy/carb modified  Filed Weights   02/18/21 0948 02/20/21 0600 02/21/21 0408  Weight: 90.7 kg 96 kg 95.3 kg    History of present illness: ( per admitting MD Dr Olevia Bowens) Chief Complaint: Fever and flulike symptoms.   HPI: Amanda Butler is a 65 y.o. female with medical history significant of osteoarthritis of the knees, asthma, GERD, hyperlipidemia, hypertension, type 2 diabetes, class I obesity, history of pneumonia, RVDD, venous stasis ulcers with history of multiple episodes of cellulitis who is coming to the emergency department from his coming to the emergency department due to fever and flulike symptoms.  She reported having a fever of 102F the previous night and 100.57F yesterday morning.  Her left lower extremity is very edematous and erythematous when compared to baseline.  She complains of fatigue, weakness and lightheadedness.  No rhinorrhea, sore throat, wheezing or hemoptysis.  Denied chest pain,  palpitations, diaphoresis, PND, orthopnea, but gets frequent lower extremity edema.  No abdominal pain, nausea, vomiting, diarrhea, constipation, melena or hematochezia.  Denied flank pain, dysuria, frequency or hematuria.  Denied polyuria, polydipsia, polyphagia or blurred vision.   ED Course: Initial vital signs were temperature 99.9 F, pulse 88, respiration 18, BP 181/68 mmHg O2 sat 99% on room air.  The patient was given Norco 5/325 mg tablet x1, dalvavancin 1500 mg IVPB x1 and then was consulted for vancomycin per pharmacy.  ID was consulted.   Lab work: CBC showed a white count 13.9, hemoglobin 2 g/dL platelets 202.  BMP showed a potassium of 3.4 mmol/L.  Glucose 231 and calcium 8.8 mg/dL.  The rest of the BMP values were normal.  Lactic acid was normal.   Imaging: Left tibia and fibula x-ray shows soft tissue edema throughout the left lower extremity.  Please see images and phonated report for further details.    Hospital Course:  Principal Problem:   Cellulitis of left lower extremity Active Problems:   Stasis dermatitis of both legs   GERD (gastroesophageal reflux disease)   Essential hypertension   Type II diabetes mellitus (HCC)   Chronic diastolic CHF (congestive heart failure) (HCC)   Hypokalemia  Left lower extremity cellulitis  -h/o venous stasis ulcers with multiple episodes cellulitis, MRSA from LLE wound Cx on 09/19/2018,  -Reported fever 102 at home -No fever on arrival, WBC 13.9 on arrival -Venous ultrasound negative for DVT on 11/8 -Blood culture no growth todate -Seen by ID, given dalbavancin, also treated with  IV clindamycin then oral for antitoxin effect , erythema has  much improved, wbc normalized, no fever in the hospital, ID signed off and arranged outpatient follow up -Seen by wound care: Apply xeroform gauze to open wounds on bilat legs Q day, then cover with ABD pads and kerlex, beginning just behind toes to below knees, then ace wrap in the same manner.   --Need ID and wound care follow-up She declined home health     Hypokalemia Replaced and normalized   mag 2.3   Chronic diastolic CHF/hypertension/hyperlipidemia/noninsulin-dependent type 2 diabetes -Appears stable at baseline, continue home meds   Nutritional Assessment: The patient's BMI is: Body mass index is 34.15 kg/m..   FTT: she declined home health   Procedures: none  Consultations: ID  Discharge Exam: BP 133/63   Pulse (!) 56   Temp 97.6 F (36.4 C) (Oral)   Resp 20   Ht 5\' 6"  (1.676 m)   Wt 95.3 kg   SpO2 100%   BMI 33.93 kg/m   General: NAD Cardiovascular: RRR Respiratory: normal respiratory effort   Discharge Instructions You were cared for by a hospitalist during your hospital stay. If you have any questions about your discharge medications or the care you received while you were in the hospital after you are discharged, you can call the unit and asked to speak with the hospitalist on call if the hospitalist that took care of you is not available. Once you are discharged, your primary care physician will handle any further medical issues. Please note that NO REFILLS for any discharge medications will be authorized once you are discharged, as it is imperative that you return to your primary care physician (or establish a relationship with a primary care physician if you do not have one) for your aftercare needs so that they can reassess your need for medications and monitor your lab values.  Discharge Instructions     Ambulatory referral to Infectious Disease   Complete by: As directed    Cellulitis patient:  Received dalbavancin on 02/19/2021.   Diet - low sodium heart healthy   Complete by: As directed    Carb modified diet   Discharge wound care:   Complete by: As directed    Apply Xeroform gauze to open wound on bilateral legs daily, then cover with ABD pads and Kerlix, beginning just behind the toes to below knees, then Ace wrap in the same  manner.   Increase activity slowly   Complete by: As directed       Allergies as of 02/21/2021       Reactions   Bee Venom Anaphylaxis   Iodine Shortness Of Breath, Rash   Shellfish Allergy Anaphylaxis   Actos [pioglitazone] Other (See Comments)   Erratic heartbeat   Aspirin Hives   Celecoxib    Other reaction(s): Unknown   Latex Hives   Metformin And Related Other (See Comments)   Patient reports flu-like symptoms and fatigue   Morphine And Related Other (See Comments)   Childhood allergy (65 years old) , pt got sicker , temp went up, dr gave her the wrong medication   Nsaids Hives   Other    Xray dye   Ciprofloxacin Palpitations   Joint pain        Medication List     STOP taking these medications    amoxicillin-clavulanate 875-125 MG tablet Commonly known as: AUGMENTIN       TAKE these medications    acetaminophen 325 MG tablet Commonly known as: TYLENOL Take 650 mg by mouth every 6 (  six) hours as needed for moderate pain.   albuterol 108 (90 Base) MCG/ACT inhaler Commonly known as: VENTOLIN HFA Inhale 2 puffs into the lungs every 6 (six) hours as needed for shortness of breath or wheezing.   clindamycin 150 MG capsule Commonly known as: CLEOCIN Take 3 capsules (450 mg total) by mouth every 8 (eight) hours for 2 doses.   eucerin cream Apply 1 application topically as needed for dry skin.   fenofibrate 54 MG tablet Take 1 tablet (54 mg total) by mouth daily.   glimepiride 4 MG tablet Commonly known as: Amaryl Take 1 tablet (4 mg total) by mouth every morning.   isosorbide mononitrate 30 MG 24 hr tablet Commonly known as: IMDUR Take 1 tablet (30 mg total) by mouth daily.   lisinopril 10 MG tablet Commonly known as: ZESTRIL Take 10 mg by mouth daily. What changed: Another medication with the same name was removed. Continue taking this medication, and follow the directions you see here.   metoprolol succinate 50 MG 24 hr tablet Commonly known  as: TOPROL-XL Take 50 mg by mouth daily. Take with or immediately following a meal.   potassium chloride SA 20 MEQ tablet Commonly known as: KLOR-CON Take 1 tablet (20 mEq total) by mouth 2 (two) times daily.   repaglinide 1 MG tablet Commonly known as: PRANDIN Take 1 mg by mouth 2 (two) times daily before a meal.   rosuvastatin 20 MG tablet Commonly known as: CRESTOR Take 20 mg by mouth daily. What changed: Another medication with the same name was removed. Continue taking this medication, and follow the directions you see here.   triamterene-hydrochlorothiazide 75-50 MG tablet Commonly known as: MAXZIDE Take 1 tablet by mouth daily.               Discharge Care Instructions  (From admission, onward)           Start     Ordered   02/21/21 0000  Discharge wound care:       Comments: Apply Xeroform gauze to open wound on bilateral legs daily, then cover with ABD pads and Kerlix, beginning just behind the toes to below knees, then Ace wrap in the same manner.   02/21/21 0849           Allergies  Allergen Reactions   Bee Venom Anaphylaxis   Iodine Shortness Of Breath and Rash   Shellfish Allergy Anaphylaxis   Actos [Pioglitazone] Other (See Comments)    Erratic heartbeat   Aspirin Hives   Celecoxib     Other reaction(s): Unknown   Latex Hives   Metformin And Related Other (See Comments)    Patient reports flu-like symptoms and fatigue   Morphine And Related Other (See Comments)    Childhood allergy (65 years old) , pt got sicker , temp went up, dr gave her the wrong medication   Nsaids Hives   Other     Xray dye   Ciprofloxacin Palpitations    Joint pain    Follow-up Information     Gaynelle Arabian, MD. Schedule an appointment as soon as possible for a visit in 2 days.   Specialty: Family Medicine Contact information: [address] [place] [address] [phone]         [facility]               Follow up in 1 week(s).   Contact information: [address]  [facility] [place] [id number] [phone]        Mignon Pine, DO Follow up on 02/25/2021.   Specialties: Infectious Diseases, Internal Medicine Contact information: [address] [place] [address] [phone]                  The results of significant diagnostics from this hospitalization (including imaging, microbiology, ancillary and laboratory) are listed below for reference.    Significant Diagnostic Studies: DG Chest 2 View  Result Date: 02/10/2021 CLINICAL DATA:  Pt complains of fever, nasal congestion, and headache x 2 days. Hx of asthma, HTN, pna, DM, CHF. cough EXAM: CHEST - 2 VIEW COMPARISON:  01/20/2021 FINDINGS: Normal mediastinum and cardiac silhouette. Normal pulmonary vasculature. No evidence of effusion, infiltrate, or pneumothorax. No acute bony abnormality. IMPRESSION: No acute cardiopulmonary process. Electronically Signed   By: Suzy Bouchard M.D.   On: 02/10/2021 05:12   DG Tibia/Fibula Left  Result Date: 02/19/2021 CLINICAL DATA:  Chronic swelling. Warmth of the distal left lower extremity EXAM: LEFT TIBIA AND FIBULA - 2 VIEW COMPARISON:  None. FINDINGS: No acute fracture or dislocation. No aggressive osseous lesion. Normal alignment. 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 Mild osteoarthritis of the lateral femorotibial compartment. Soft tissue edema throughout the left lower leg. No radiopaque foreign body or soft tissue emphysema. IMPRESSION: Soft tissue edema throughout the left lower leg as can be seen with fluid overload, venous insufficiency, or cellulitis. Electronically Signed   By: Kathreen Devoid M.D.   On: 02/19/2021 08:27   DG Chest Portable 1 View  Result Date: 02/18/2021 CLINICAL DATA:  cough, fever EXAM: PORTABLE CHEST 1 VIEW COMPARISON:  February 10, 2021. FINDINGS: Borderline enlargement of the cardiac  silhouette, similar. Both lungs are clear. No visible pleural effusions or pneumothorax. No acute osseous abnormality. IMPRESSION: No evidence of acute cardiopulmonary disease. Electronically Signed   By: Margaretha Sheffield M.D.   On: 02/18/2021 09:24   VAS [place] LOWER EXTREMITY VENOUS (DVT) (ONLY [facility] & [facility])  Result Date: 02/19/2021  Lower Venous DVT Study Patient Name:  MIKYLA SCHACHTER  Date of Exam:   02/19/2021 Medical Rec #: [id number]        Accession #:    [id number] Date of Birth: Dec 20, 1955         Patient Gender: F Patient Age:   86 years Exam Location:  [facility] Procedure:      VAS [place] LOWER EXTREMITY VENOUS (DVT) Referring Phys: Thurmond Butts DIXON --------------------------------------------------------------------------------  Indications: Pain, Swelling, and Erythema. Other Indications: Patient with history of cellulitis. Limitations: Poor ultrasound/tissue interface. Comparison Study: Previous examon 08/03/2019 was negative for DVT Performing Technologist: Rogelia Rohrer RVT, RDMS  Examination Guidelines: A complete evaluation includes B-mode imaging, spectral Doppler, color Doppler, and power Doppler as needed of all accessible portions of each vessel. Bilateral testing is considered an integral part of a complete examination. Limited examinations for reoccurring indications may be performed as noted. The reflux portion of the exam is performed with the patient in reverse Trendelenburg.  +-----+---------------+---------+-----------+----------+--------------+ RIGHTCompressibilityPhasicitySpontaneityPropertiesThrombus Aging +-----+---------------+---------+-----------+----------+--------------+ CFV  Full           Yes      Yes                                 +-----+---------------+---------+-----------+----------+--------------+   +---------+---------------+---------+-----------+----------+-------------------+ LEFT     CompressibilityPhasicitySpontaneityPropertiesThrombus Aging       +---------+---------------+---------+-----------+----------+-------------------+ CFV  Full           Yes      Yes                                      +---------+---------------+---------+-----------+----------+-------------------+ SFJ      Full                                                             +---------+---------------+---------+-----------+----------+-------------------+ FV Prox  Full           Yes      Yes                                      +---------+---------------+---------+-----------+----------+-------------------+ FV Mid   Full           Yes      Yes                                      +---------+---------------+---------+-----------+----------+-------------------+ FV DistalFull           Yes      Yes                                      +---------+---------------+---------+-----------+----------+-------------------+ PFV      Full                                                             +---------+---------------+---------+-----------+----------+-------------------+ POP      Full           Yes      Yes                                      +---------+---------------+---------+-----------+----------+-------------------+ PTV      Full                                                             +---------+---------------+---------+-----------+----------+-------------------+ PERO     Full                                         Not well visualized +---------+---------------+---------+-----------+----------+-------------------+     Summary: RIGHT: - No evidence of common femoral vein obstruction. - Ultrasound characteristics of enlarged lymph nodes are noted in the groin.  LEFT: - There is no evidence of deep vein thrombosis in the lower extremity. - There is no evidence of superficial venous thrombosis.  -  No cystic structure found in the popliteal fossa. - Ultrasound characteristics of enlarged lymph nodes noted in the groin.   *See table(s) above for measurements and observations. Electronically signed by Deitra Mayo MD on 02/19/2021 at 9:28:06 AM.    Final     Microbiology: Recent Results (from the past 240 hour(s))  Resp Panel by RT-PCR (Flu A&B, Covid) Nasopharyngeal Swab     Status: None   Collection Time: 02/18/21  9:26 AM   Specimen: Nasopharyngeal Swab; Nasopharyngeal(NP) swabs in vial transport medium  Result Value Ref Range Status   SARS Coronavirus 2 by RT PCR NEGATIVE NEGATIVE Final    Comment: (NOTE) SARS-CoV-2 target nucleic acids are NOT DETECTED.  The SARS-CoV-2 RNA is generally detectable in upper respiratory specimens during the acute phase of infection. The lowest concentration of SARS-CoV-2 viral copies this assay can detect is 138 copies/mL. A negative result does not preclude SARS-Cov-2 infection and should not be used as the sole basis for treatment or other patient management decisions. A negative result may occur with  improper specimen collection/handling, submission of specimen other than nasopharyngeal swab, presence of viral mutation(s) within the areas targeted by this assay, and inadequate number of viral copies(<138 copies/mL). A negative result must be combined with clinical observations, patient history, and epidemiological information. The expected result is Negative.  Fact Sheet for Patients:  EntrepreneurPulse.com.au  Fact Sheet for Healthcare Providers:  IncredibleEmployment.be  This test is no t yet approved or cleared by the [place] FDA and  has been authorized for detection and/or diagnosis of SARS-CoV-2 by FDA under an Emergency Use Authorization (EUA). This EUA will remain  in effect (meaning this test can be used) for the duration of the COVID-19 declaration under Section 564(b)(1) of the Act, 21 U.S.C.section 360bbb-3(b)(1), unless the authorization is terminated  or revoked sooner.       Influenza A by PCR  NEGATIVE NEGATIVE Final   Influenza B by PCR NEGATIVE NEGATIVE Final    Comment: (NOTE) The Xpert Xpress SARS-CoV-2/FLU/RSV plus assay is intended as an aid in the diagnosis of influenza from Nasopharyngeal swab specimens and should not be used as a sole basis for treatment. Nasal washings and aspirates are unacceptable for Xpert Xpress SARS-CoV-2/FLU/RSV testing.  Fact Sheet for Patients: EntrepreneurPulse.com.au  Fact Sheet for Healthcare Providers: IncredibleEmployment.be  This test is not yet approved or cleared by the [place] FDA and has been authorized for detection and/or diagnosis of SARS-CoV-2 by FDA under an Emergency Use Authorization (EUA). This EUA will remain in effect (meaning this test can be used) for the duration of the COVID-19 declaration under Section 564(b)(1) of the Act, 21 U.S.C. section 360bbb-3(b)(1), unless the authorization is terminated or revoked.  Performed at [facility], [place] [address] [place], [place] [address]   Blood culture (routine x 2)     Status: None (Preliminary result)   Collection Time: 02/19/21  1:19 AM   Specimen: BLOOD  Result Value Ref Range Status   Specimen Description   Final    BLOOD BLOOD RIGHT FOREARM Performed at [facility] [address], [place], [place] [address]    Special Requests   Final    BOTTLES DRAWN AEROBIC AND ANAEROBIC Blood Culture results may not be optimal due to an inadequate volume of blood received in culture bottles Performed at [facility] [address] [place], [place] [address]    Culture   Final    NO GROWTH 2  DAYS Performed at [facility], [place] [address] [place], [place] [address]    Report Status PENDING  Incomplete  Blood culture (routine x 2)     Status: None (Preliminary result)   Collection Time: 02/19/21  1:19 AM   Specimen: BLOOD  Result Value Ref Range Status   Specimen  Description   Final    BLOOD BLOOD RIGHT FOREARM Performed at [facility] [address], [place], [place] [address]    Special Requests   Final    BOTTLES DRAWN AEROBIC AND ANAEROBIC Blood Culture adequate volume Performed at [facility] [address] [place], [place] [address]    Culture   Final    NO GROWTH 2 DAYS Performed at [facility] [address], [place], [place] [address]    Report Status PENDING  Incomplete     Labs: Basic Metabolic Panel: Recent Labs  Lab 02/18/21 0930 02/20/21 0535 02/21/21 0529  NA 135 136 134*  K 3.4* 3.3* 4.2  CL 100 99 100  CO2 25 29 26   GLUCOSE 231* 138* 226*  BUN 11 18 25*  CREATININE 0.68 0.86 0.99  CALCIUM 8.8* 8.4* 8.7*  MG  --   --  2.3   Liver Function Tests: Recent Labs  Lab 02/20/21 0535  AST 11*  ALT 9  ALKPHOS 57  BILITOT 0.9  PROT 6.7  ALBUMIN 3.0*   No results for input(s): LIPASE, AMYLASE in the last 168 hours. No results for input(s): AMMONIA in the last 168 hours. CBC: Recent Labs  Lab 02/18/21 0930 02/20/21 0535  WBC 13.9* 6.0  NEUTROABS 12.6*  --   HGB 12.2 11.1*  HCT 36.6 34.0*  MCV 85.3 85.2  PLT 202 184   Cardiac Enzymes: No results for input(s): CKTOTAL, CKMB, CKMBINDEX, TROPONINI in the last 168 hours. BNP: BNP (last 3 results) Recent Labs    06/13/20 1532  BNP 47.5    ProBNP (last 3 results) No results for input(s): PROBNP in the last 8760 hours.  CBG: Recent Labs  Lab 02/20/21 0802 02/20/21 1205 02/20/21 1632 02/20/21 1930 02/21/21 0747  GLUCAP 123* 147* 136* 168* 192*       Signed:  Florencia Reasons MD, PhD, FACP  [facility] 02/21/2021, 9:53 AM

## 2021-02-21 NOTE — Progress Notes (Signed)
Patient discharged to home, discharge instructions reviewed with patient who verbalized understanding. Patient received medications from the out patient pharmacy prior to discharge.

## 2021-02-21 NOTE — Consult Note (Signed)
[facility] Inpatient Consult   02/21/2021  Amanda Butler 1956/01/08 [id number]  Patient chart has been reviewed for readmissions less than 30 days and for high risk score for unplanned readmissions.  Patient assessed for community [place] [facility] CM) post hospital follow up needs.   Unsuccessful attempt to speak with patient by telephone to assess for post hospital follow up needs. Per review, primary provider is Dr. Marisue Humble. Patient for home discharge. Referral to [facility] RN case manager for post hospital follow up.  Of note, [facility] Care Management services does not replace or interfere with any services that are arranged by inpatient case management or social work.   Netta Cedars, MSN, RN [facility] [employer] [phone]  Toll free office [phone]

## 2021-02-24 LAB — CULTURE, BLOOD (ROUTINE X 2)
Culture: NO GROWTH
Culture: NO GROWTH
Special Requests: ADEQUATE

## 2021-02-25 ENCOUNTER — Other Ambulatory Visit: Payer: Self-pay | Admitting: *Deleted

## 2021-02-25 ENCOUNTER — Ambulatory Visit (INDEPENDENT_AMBULATORY_CARE_PROVIDER_SITE_OTHER): Payer: Medicare Other | Admitting: Internal Medicine

## 2021-02-25 ENCOUNTER — Other Ambulatory Visit: Payer: Self-pay

## 2021-02-25 ENCOUNTER — Encounter: Payer: Self-pay | Admitting: Internal Medicine

## 2021-02-25 VITALS — BP 176/83 | HR 72 | Temp 98.4°F | Wt 214.0 lb

## 2021-02-25 DIAGNOSIS — L97921 Non-pressure chronic ulcer of unspecified part of left lower leg limited to breakdown of skin: Secondary | ICD-10-CM

## 2021-02-25 DIAGNOSIS — L03116 Cellulitis of left lower limb: Secondary | ICD-10-CM

## 2021-02-25 DIAGNOSIS — I890 Lymphedema, not elsewhere classified: Secondary | ICD-10-CM

## 2021-02-25 DIAGNOSIS — L97911 Non-pressure chronic ulcer of unspecified part of right lower leg limited to breakdown of skin: Secondary | ICD-10-CM | POA: Diagnosis not present

## 2021-02-25 NOTE — Patient Outreach (Addendum)
[facility]) Care Management  02/25/2021  Amanda Butler 1955-07-29 [id number]  Referral received 11/10-Primary provider to complete transition of care call  Initial Outreach  RN attempted outreach call today however unsuccessful. RN able to leave a HIPAA approved voice message requesting  call back.   Will scheduled another outreach call over the next week for pending services.  [name], RN Care Management Coordinator [place] Office [phone]

## 2021-02-25 NOTE — Progress Notes (Signed)
[place] for Infectious Disease  Reason for Consult:Cellulitis  Referring Provider: Hospital follow up   HPI:    Amanda Butler is a 65 y.o. female with PMHx as below who presents to the clinic for cellulitis.   Patient was recently admitted 11/7 to 02/21/2021 at [facility] where she presented with fevers and flulike symptoms with lower extremity erythema and edema of her left leg that was increased from baseline.  She has a history of chronic lymphedema and venous stasis.  On arrival she was afebrile and had a leukocytosis of 13.9.  She was given a dose of dalbavancin and x-rays showed soft tissue edema throughout the lower extremity.  She was seen by my partner, Dr. Candiss Norse, while inpatient.  In addition to dalbavancin she was also given clindamycin for 3 days for added toxin effect.  Her blood cultures were no growth at 5 days and she presents today for follow-up.  It was noted that her left lower extremity edema/erythema significantly improved during her hospitalization with redness receding from above the knee to approximately 3 inches below the knee.  She has continued to have improvement.  She is working on controlling her lymphedema and seeing wound care.  Patient's Medications  New Prescriptions   No medications on file  Previous Medications   ACETAMINOPHEN (TYLENOL) 325 MG TABLET    Take 650 mg by mouth every 6 (six) hours as needed for moderate pain.   ALBUTEROL (VENTOLIN HFA) 108 (90 BASE) MCG/ACT INHALER    Inhale 2 puffs into the lungs every 6 (six) hours as needed for shortness of breath or wheezing.   FENOFIBRATE 54 MG TABLET    Take 1 tablet (54 mg total) by mouth daily.   GLIMEPIRIDE (AMARYL) 4 MG TABLET    Take 1 tablet (4 mg total) by mouth every morning.   ISOSORBIDE MONONITRATE (IMDUR) 30 MG 24 HR TABLET    Take 1 tablet (30 mg total) by mouth daily.   LISINOPRIL (ZESTRIL) 10 MG TABLET    Take 10 mg by mouth daily.   METOPROLOL SUCCINATE  (TOPROL-XL) 50 MG 24 HR TABLET    Take 50 mg by mouth daily. Take with or immediately following a meal.   POTASSIUM CHLORIDE SA (KLOR-CON) 20 MEQ TABLET    Take 1 tablet (20 mEq total) by mouth 2 (two) times daily.   REPAGLINIDE (PRANDIN) 1 MG TABLET    Take 1 mg by mouth 2 (two) times daily before a meal.   ROSUVASTATIN (CRESTOR) 20 MG TABLET    Take 20 mg by mouth daily.   SKIN PROTECTANTS, MISC. (EUCERIN) CREAM    Apply 1 application topically as needed for dry skin.   TRIAMTERENE-HYDROCHLOROTHIAZIDE (MAXZIDE) 75-50 MG PER TABLET    Take 1 tablet by mouth daily.  Modified Medications   No medications on file  Discontinued Medications   No medications on file      Past Medical History:  Diagnosis Date   Arthritis    "knees" (09/13/2015)   Asthma    Cellulitis 11/17/2018   LEFT LEG    Chronic diastolic CHF (congestive heart failure) ([place])    a. 09/2015 Echo: EF 55-60%, mild LVH, gr2DD, no rwma, mild MR, mildly dil LA.    GERD (gastroesophageal reflux disease)    High cholesterol    Hypertension    Midsternal chest pain    a. 09/2015 MV: no ischemia/infarct, EF 61%; b. 09/2015 Low prob V:Q scan.  Morbid obesity ([place])    Pneumonia ~ 2000   "mild case"   RBBB (right bundle branch block) approx 2010   Dr Marisue Humble did ECG and told her   Type II diabetes mellitus ([place])     Social History   Tobacco Use   Smoking status: Never   Smokeless tobacco: Never  Vaping Use   Vaping Use: Never used  Substance Use Topics   Alcohol use: No   Drug use: No    Family History  Problem Relation Age of Onset   Diabetes Mother    Hypertension Mother    Diabetes Father    Hypertension Father    Heart attack Father 17   Diabetes Maternal Grandmother    Hypertension Maternal Grandmother    Cancer Maternal Grandfather    Diabetes Maternal Grandfather    Hypertension Maternal Grandfather    Asthma Paternal Grandmother    Diabetes Paternal Grandmother    Hypertension Paternal Grandmother     Diabetes Paternal Grandfather    Hypertension Paternal Grandfather    Heart failure Paternal Grandfather    Diabetes Maternal Aunt     Allergies  Allergen Reactions   Bee Venom Anaphylaxis   Iodine Shortness Of Breath and Rash   Shellfish Allergy Anaphylaxis   Actos [Pioglitazone] Other (See Comments)    Erratic heartbeat   Aspirin Hives   Celecoxib     Other reaction(s): Unknown   Latex Hives   Metformin And Related Other (See Comments)    Patient reports flu-like symptoms and fatigue   Morphine And Related Other (See Comments)    Childhood allergy (65 years old) , pt got sicker , temp went up, dr gave her the wrong medication   Nsaids Hives   Other     Xray dye   Ciprofloxacin Palpitations    Joint pain    Review of Systems  All other systems reviewed and are negative. Except as noted in HPI.     OBJECTIVE:    Vitals:   02/25/21 1435  BP: (!) 176/83  Pulse: 72  Temp: 98.4 F (36.9 C)  Weight: 214 lb (97.1 kg)     Body mass index is 34.54 kg/m.  Physical Exam Constitutional:      General: She is not in acute distress.    Appearance: Normal appearance.  Pulmonary:     Effort: Pulmonary effort is normal. No respiratory distress.  Musculoskeletal:        General: Swelling present.  Skin:    General: Skin is warm and dry.     Findings: Erythema and rash present.  Neurological:     General: No focal deficit present.     Mental Status: She is alert and oriented to person, place, and time.        Labs and Microbiology:  CBC Latest Ref Rng & Units 02/20/2021 02/18/2021 02/09/2021  WBC 4.0 - 10.5 K/uL 6.0 13.9(H) 7.9  Hemoglobin 12.0 - 15.0 g/dL 11.1(L) 12.2 12.1  Hematocrit 36.0 - 46.0 % 34.0(L) 36.6 36.5  Platelets 150 - 400 K/uL 184 202 182   CMP Latest Ref Rng & Units 02/21/2021 02/20/2021 02/18/2021  Glucose 70 - 99 mg/dL 226(H) 138(H) 231(H)  BUN 8 - 23 mg/dL 25(H) 18 11  Creatinine 0.44 - 1.00 mg/dL 0.99 0.86 0.68  Sodium 135 - 145 mmol/L 134(L)  136 135  Potassium 3.5 - 5.1 mmol/L 4.2 3.3(L) 3.4(L)  Chloride 98 - 111 mmol/L 100 99 100  CO2 22 - 32 mmol/L  26 29 25   Calcium 8.9 - 10.3 mg/dL 8.7(L) 8.4(L) 8.8(L)  Total Protein 6.5 - 8.1 g/dL - 6.7 -  Total Bilirubin 0.3 - 1.2 mg/dL - 0.9 -  Alkaline Phos 38 - 126 U/L - 57 -  AST 15 - 41 U/L - 11(L) -  ALT 0 - 44 U/L - 9 -     Recent Results (from the past 240 hour(s))  Resp Panel by RT-PCR (Flu A&B, Covid) Nasopharyngeal Swab     Status: None   Collection Time: 02/18/21  9:26 AM   Specimen: Nasopharyngeal Swab; Nasopharyngeal(NP) swabs in vial transport medium  Result Value Ref Range Status   SARS Coronavirus 2 by RT PCR NEGATIVE NEGATIVE Final    Comment: (NOTE) SARS-CoV-2 target nucleic acids are NOT DETECTED.  The SARS-CoV-2 RNA is generally detectable in upper respiratory specimens during the acute phase of infection. The lowest concentration of SARS-CoV-2 viral copies this assay can detect is 138 copies/mL. A negative result does not preclude SARS-Cov-2 infection and should not be used as the sole basis for treatment or other patient management decisions. A negative result may occur with  improper specimen collection/handling, submission of specimen other than nasopharyngeal swab, presence of viral mutation(s) within the areas targeted by this assay, and inadequate number of viral copies(<138 copies/mL). A negative result must be combined with clinical observations, patient history, and epidemiological information. The expected result is Negative.  Fact Sheet for Patients:  EntrepreneurPulse.com.au  Fact Sheet for Healthcare Providers:  IncredibleEmployment.be  This test is no t yet approved or cleared by the [place] FDA and  has been authorized for detection and/or diagnosis of SARS-CoV-2 by FDA under an Emergency Use Authorization (EUA). This EUA will remain  in effect (meaning this test can be used) for the duration of  the COVID-19 declaration under Section 564(b)(1) of the Act, 21 U.S.C.section 360bbb-3(b)(1), unless the authorization is terminated  or revoked sooner.       Influenza A by PCR NEGATIVE NEGATIVE Final   Influenza B by PCR NEGATIVE NEGATIVE Final    Comment: (NOTE) The Xpert Xpress SARS-CoV-2/FLU/RSV plus assay is intended as an aid in the diagnosis of influenza from Nasopharyngeal swab specimens and should not be used as a sole basis for treatment. Nasal washings and aspirates are unacceptable for Xpert Xpress SARS-CoV-2/FLU/RSV testing.  Fact Sheet for Patients: EntrepreneurPulse.com.au  Fact Sheet for Healthcare Providers: IncredibleEmployment.be  This test is not yet approved or cleared by the [place] FDA and has been authorized for detection and/or diagnosis of SARS-CoV-2 by FDA under an Emergency Use Authorization (EUA). This EUA will remain in effect (meaning this test can be used) for the duration of the COVID-19 declaration under Section 564(b)(1) of the Act, 21 U.S.C. section 360bbb-3(b)(1), unless the authorization is terminated or revoked.  Performed at [facility], [place] [address] [place], [place] [address]   Blood culture (routine x 2)     Status: None   Collection Time: 02/19/21  1:19 AM   Specimen: BLOOD  Result Value Ref Range Status   Specimen Description   Final    BLOOD BLOOD RIGHT FOREARM Performed at [place] [address], [place], [place] [address]    Special Requests   Final    BOTTLES DRAWN AEROBIC AND ANAEROBIC Blood Culture results may not be optimal due to an inadequate volume of blood received in culture bottles Performed at [place] [address] [place], [place]  [address]    Culture   Final    NO GROWTH 5 DAYS Performed at [facility], [place] [address] [place], [place] [address]    Report Status 02/24/2021 FINAL  Final   Blood culture (routine x 2)     Status: None   Collection Time: 02/19/21  1:19 AM   Specimen: BLOOD  Result Value Ref Range Status   Specimen Description   Final    BLOOD BLOOD RIGHT FOREARM Performed at [place] [address] [place], [place] [address]    Special Requests   Final    BOTTLES DRAWN AEROBIC AND ANAEROBIC Blood Culture adequate volume Performed at [place] [address], [place], [place] [address]    Culture   Final    NO GROWTH 5 DAYS Performed at [facility], [place] [address], [place], [place] [address]    Report Status 02/24/2021 FINAL  Final     ASSESSMENT & PLAN:     1. Cellulitis of left lower extremity  2. Ulcers of both lower extremities, limited to breakdown of skin (HCC)  3. Lymphedema    She has chronic lymphedema and venous stasis ulcers complicated by recent episode of left lower extremity cellulitis.  She received a dose of dalbavancin on 02/18/2021 which provides adequate coverage for cellulitis.  She reports ongoing redness/swelling but this is not worsening and overall improved.  Also, she has had no further fevers.  Discussed that her cellulitis will be slow to resolve in the setting of her other comorbidities.  We will have her follow-up in 10 days with Dr Candiss Norse for clinical reevaluation to ensure she is doing okay off antibiotics.  Raynelle Highland for Infectious Disease [facility] 02/25/2021, 3:01 PM  I spent 30 minutes dedicated to the care of this patient on the date of this encounter to include pre-visit review of records, face-to-face time with the patient discussing cellulitis, ulcers, lymphedema and post-visit ordering of testing.

## 2021-02-28 ENCOUNTER — Other Ambulatory Visit: Payer: Self-pay | Admitting: *Deleted

## 2021-02-28 NOTE — Patient Outreach (Signed)
[place] [facility]) Care Management  02/28/2021  Amanda Butler 04/18/1955 [id number]   [facility] Telephone Assessment-Unsuccessful Outreach #2  RN attempted several outreach call to all available contact numbers noted in [place]. RN only able to leave a HIPAA approved voice message to the daughter Rhiannon requesting a call back. RN also spoke with pt's provider's office confirmed the same contact numbers. Pt pending next office appointment on 11/29. Requested instructions for office to provide [facility] contact for possible call back for engagement of services.   Will continue follow up call accordingly and place another outreach call next week.  [name], RN Care Management Coordinator [place] Office [phone]

## 2021-03-05 ENCOUNTER — Other Ambulatory Visit: Payer: Self-pay | Admitting: *Deleted

## 2021-03-05 NOTE — Patient Outreach (Signed)
[place] [facility]) Care Management  03/05/2021  Amanda Butler 12-19-1955 [id number]   Assessment Telephone-Unsuccessful  RN attempted outreach calls at the available contacts however unsuccessful. RN was able to leave a HIPAA approved requested a call back.  Will attempt another outreach call over the next month for pending [facility] services.  [name], RN Care Management Coordinator [place] Office [phone]

## 2021-03-06 ENCOUNTER — Ambulatory Visit: Payer: Medicare Other | Admitting: Internal Medicine

## 2021-03-20 ENCOUNTER — Encounter (HOSPITAL_BASED_OUTPATIENT_CLINIC_OR_DEPARTMENT_OTHER): Payer: Medicare Other | Admitting: Physician Assistant

## 2021-03-27 ENCOUNTER — Other Ambulatory Visit: Payer: Self-pay | Admitting: *Deleted

## 2021-03-27 NOTE — Patient Outreach (Signed)
[place] [facility]) Care Management  03/27/2021  Amanda Butler 06/05/1955 [id number]   Case Closure Outreach #4  RN has attempted several outreach to all available contacts with no success.   Will notify the provider of pt's disposition with [facility] services and close this case.  [name], RN Care Management Coordinator [place] Office [phone]

## 2021-04-05 ENCOUNTER — Ambulatory Visit: Payer: Medicare Other | Admitting: Cardiovascular Disease

## 2021-05-06 ENCOUNTER — Emergency Department (HOSPITAL_COMMUNITY): Payer: Medicare Other

## 2021-05-06 ENCOUNTER — Other Ambulatory Visit: Payer: Self-pay

## 2021-05-06 ENCOUNTER — Encounter (HOSPITAL_COMMUNITY): Payer: Self-pay

## 2021-05-06 ENCOUNTER — Inpatient Hospital Stay (HOSPITAL_COMMUNITY)
Admission: EM | Admit: 2021-05-06 | Discharge: 2021-05-09 | DRG: 871 | Disposition: A | Discharge status: Home / Self Care | Payer: Medicare Other | Attending: Internal Medicine | Admitting: Internal Medicine

## 2021-05-06 DIAGNOSIS — Z91013 Allergy to seafood: Secondary | ICD-10-CM

## 2021-05-06 DIAGNOSIS — J45909 Unspecified asthma, uncomplicated: Secondary | ICD-10-CM | POA: Diagnosis not present

## 2021-05-06 DIAGNOSIS — E1165 Type 2 diabetes mellitus with hyperglycemia: Secondary | ICD-10-CM | POA: Diagnosis present

## 2021-05-06 DIAGNOSIS — I5032 Chronic diastolic (congestive) heart failure: Secondary | ICD-10-CM | POA: Diagnosis present

## 2021-05-06 DIAGNOSIS — Z20822 Contact with and (suspected) exposure to covid-19: Secondary | ICD-10-CM | POA: Diagnosis not present

## 2021-05-06 DIAGNOSIS — N390 Urinary tract infection, site not specified: Secondary | ICD-10-CM | POA: Diagnosis not present

## 2021-05-06 DIAGNOSIS — R600 Localized edema: Secondary | ICD-10-CM | POA: Diagnosis not present

## 2021-05-06 DIAGNOSIS — E119 Type 2 diabetes mellitus without complications: Secondary | ICD-10-CM | POA: Diagnosis not present

## 2021-05-06 DIAGNOSIS — R7989 Other specified abnormal findings of blood chemistry: Secondary | ICD-10-CM | POA: Diagnosis not present

## 2021-05-06 DIAGNOSIS — R42 Dizziness and giddiness: Secondary | ICD-10-CM | POA: Diagnosis not present

## 2021-05-06 DIAGNOSIS — I4510 Unspecified right bundle-branch block: Secondary | ICD-10-CM | POA: Diagnosis not present

## 2021-05-06 DIAGNOSIS — Z91128 Patient's intentional underdosing of medication regimen for other reason: Secondary | ICD-10-CM | POA: Diagnosis not present

## 2021-05-06 DIAGNOSIS — I110 Hypertensive heart disease with heart failure: Secondary | ICD-10-CM | POA: Diagnosis present

## 2021-05-06 DIAGNOSIS — Y92410 Unspecified street and highway as the place of occurrence of the external cause: Secondary | ICD-10-CM

## 2021-05-06 DIAGNOSIS — Z28310 Unvaccinated for covid-19: Secondary | ICD-10-CM

## 2021-05-06 DIAGNOSIS — T383X6A Underdosing of insulin and oral hypoglycemic [antidiabetic] drugs, initial encounter: Secondary | ICD-10-CM | POA: Diagnosis present

## 2021-05-06 DIAGNOSIS — Z8249 Family history of ischemic heart disease and other diseases of the circulatory system: Secondary | ICD-10-CM | POA: Diagnosis not present

## 2021-05-06 DIAGNOSIS — A4159 Other Gram-negative sepsis: Secondary | ICD-10-CM | POA: Diagnosis not present

## 2021-05-06 DIAGNOSIS — A419 Sepsis, unspecified organism: Secondary | ICD-10-CM | POA: Diagnosis not present

## 2021-05-06 DIAGNOSIS — R0602 Shortness of breath: Secondary | ICD-10-CM

## 2021-05-06 DIAGNOSIS — R531 Weakness: Secondary | ICD-10-CM | POA: Diagnosis not present

## 2021-05-06 DIAGNOSIS — Z833 Family history of diabetes mellitus: Secondary | ICD-10-CM

## 2021-05-06 DIAGNOSIS — Z886 Allergy status to analgesic agent status: Secondary | ICD-10-CM

## 2021-05-06 DIAGNOSIS — E785 Hyperlipidemia, unspecified: Secondary | ICD-10-CM | POA: Diagnosis present

## 2021-05-06 DIAGNOSIS — M48061 Spinal stenosis, lumbar region without neurogenic claudication: Secondary | ICD-10-CM | POA: Diagnosis not present

## 2021-05-06 DIAGNOSIS — R9431 Abnormal electrocardiogram [ECG] [EKG]: Secondary | ICD-10-CM | POA: Diagnosis present

## 2021-05-06 DIAGNOSIS — Z6834 Body mass index (BMI) 34.0-34.9, adult: Secondary | ICD-10-CM

## 2021-05-06 DIAGNOSIS — Y92009 Unspecified place in unspecified non-institutional (private) residence as the place of occurrence of the external cause: Secondary | ICD-10-CM | POA: Diagnosis not present

## 2021-05-06 DIAGNOSIS — R059 Cough, unspecified: Secondary | ICD-10-CM

## 2021-05-06 DIAGNOSIS — E876 Hypokalemia: Secondary | ICD-10-CM | POA: Diagnosis present

## 2021-05-06 DIAGNOSIS — E860 Dehydration: Secondary | ICD-10-CM | POA: Diagnosis not present

## 2021-05-06 DIAGNOSIS — Z91030 Bee allergy status: Secondary | ICD-10-CM

## 2021-05-06 DIAGNOSIS — I10 Essential (primary) hypertension: Secondary | ICD-10-CM | POA: Diagnosis present

## 2021-05-06 DIAGNOSIS — Z809 Family history of malignant neoplasm, unspecified: Secondary | ICD-10-CM | POA: Diagnosis not present

## 2021-05-06 DIAGNOSIS — I517 Cardiomegaly: Secondary | ICD-10-CM | POA: Diagnosis not present

## 2021-05-06 DIAGNOSIS — Z881 Allergy status to other antibiotic agents status: Secondary | ICD-10-CM

## 2021-05-06 DIAGNOSIS — E7800 Pure hypercholesterolemia, unspecified: Secondary | ICD-10-CM | POA: Diagnosis present

## 2021-05-06 DIAGNOSIS — B372 Candidiasis of skin and nail: Secondary | ICD-10-CM | POA: Diagnosis present

## 2021-05-06 DIAGNOSIS — E669 Obesity, unspecified: Secondary | ICD-10-CM | POA: Diagnosis present

## 2021-05-06 DIAGNOSIS — R6520 Severe sepsis without septic shock: Secondary | ICD-10-CM | POA: Diagnosis present

## 2021-05-06 DIAGNOSIS — I878 Other specified disorders of veins: Secondary | ICD-10-CM | POA: Diagnosis not present

## 2021-05-06 DIAGNOSIS — Z888 Allergy status to other drugs, medicaments and biological substances status: Secondary | ICD-10-CM

## 2021-05-06 DIAGNOSIS — R000 Tachycardia, unspecified: Secondary | ICD-10-CM | POA: Diagnosis not present

## 2021-05-06 DIAGNOSIS — Z885 Allergy status to narcotic agent status: Secondary | ICD-10-CM

## 2021-05-06 DIAGNOSIS — L0390 Cellulitis, unspecified: Secondary | ICD-10-CM

## 2021-05-06 DIAGNOSIS — R051 Acute cough: Secondary | ICD-10-CM | POA: Diagnosis not present

## 2021-05-06 DIAGNOSIS — R946 Abnormal results of thyroid function studies: Secondary | ICD-10-CM | POA: Diagnosis present

## 2021-05-06 DIAGNOSIS — Z91040 Latex allergy status: Secondary | ICD-10-CM

## 2021-05-06 DIAGNOSIS — R0981 Nasal congestion: Secondary | ICD-10-CM | POA: Diagnosis present

## 2021-05-06 DIAGNOSIS — Z825 Family history of asthma and other chronic lower respiratory diseases: Secondary | ICD-10-CM

## 2021-05-06 DIAGNOSIS — R509 Fever, unspecified: Secondary | ICD-10-CM | POA: Diagnosis not present

## 2021-05-06 DIAGNOSIS — S3991XA Unspecified injury of abdomen, initial encounter: Secondary | ICD-10-CM | POA: Diagnosis not present

## 2021-05-06 DIAGNOSIS — G9341 Metabolic encephalopathy: Secondary | ICD-10-CM | POA: Diagnosis present

## 2021-05-06 DIAGNOSIS — E6609 Other obesity due to excess calories: Secondary | ICD-10-CM

## 2021-05-06 DIAGNOSIS — R609 Edema, unspecified: Secondary | ICD-10-CM | POA: Diagnosis not present

## 2021-05-06 DIAGNOSIS — R55 Syncope and collapse: Secondary | ICD-10-CM

## 2021-05-06 DIAGNOSIS — M1990 Unspecified osteoarthritis, unspecified site: Secondary | ICD-10-CM | POA: Diagnosis present

## 2021-05-06 DIAGNOSIS — S299XXA Unspecified injury of thorax, initial encounter: Secondary | ICD-10-CM | POA: Diagnosis not present

## 2021-05-06 LAB — CBC WITH DIFFERENTIAL/PLATELET
Abs Immature Granulocytes: 0.15 10*3/uL — ABNORMAL HIGH (ref 0.00–0.07)
Abs Immature Granulocytes: 0.05 10*3/uL (ref 0.00–0.07)
Basophils Absolute: 0.1 10*3/uL (ref 0.0–0.1)
Basophils Absolute: 0 10*3/uL (ref 0.0–0.1)
Basophils Relative: 0 %
Basophils Relative: 0 %
Eosinophils Absolute: 0 10*3/uL (ref 0.0–0.5)
Eosinophils Absolute: 0 10*3/uL (ref 0.0–0.5)
Eosinophils Relative: 0 %
Eosinophils Relative: 0 %
HCT: 43.2 % (ref 36.0–46.0)
HCT: 40.6 % (ref 36.0–46.0)
Hemoglobin: 14.4 g/dL (ref 12.0–15.0)
Hemoglobin: 13.4 g/dL (ref 12.0–15.0)
Immature Granulocytes: 1 %
Immature Granulocytes: 0 %
Lymphocytes Relative: 2 %
Lymphocytes Relative: 9 %
Lymphs Abs: 0.5 10*3/uL — ABNORMAL LOW (ref 0.7–4.0)
Lymphs Abs: 1.2 10*3/uL (ref 0.7–4.0)
MCH: 28 pg (ref 26.0–34.0)
MCH: 28.1 pg (ref 26.0–34.0)
MCHC: 33.3 g/dL (ref 30.0–36.0)
MCHC: 33 g/dL (ref 30.0–36.0)
MCV: 83.9 fL (ref 80.0–100.0)
MCV: 85.1 fL (ref 80.0–100.0)
Monocytes Absolute: 1 10*3/uL (ref 0.1–1.0)
Monocytes Absolute: 0.8 10*3/uL (ref 0.1–1.0)
Monocytes Relative: 5 %
Monocytes Relative: 6 %
Neutro Abs: 19.5 10*3/uL — ABNORMAL HIGH (ref 1.7–7.7)
Neutro Abs: 11.9 10*3/uL — ABNORMAL HIGH (ref 1.7–7.7)
Neutrophils Relative %: 92 %
Neutrophils Relative %: 85 %
Platelets: 188 10*3/uL (ref 150–400)
Platelets: 174 10*3/uL (ref 150–400)
RBC: 5.15 MIL/uL — ABNORMAL HIGH (ref 3.87–5.11)
RBC: 4.77 MIL/uL (ref 3.87–5.11)
RDW: 13.9 % (ref 11.5–15.5)
RDW: 14 % (ref 11.5–15.5)
WBC: 21.2 10*3/uL — ABNORMAL HIGH (ref 4.0–10.5)
WBC: 14 10*3/uL — ABNORMAL HIGH (ref 4.0–10.5)
nRBC: 0 % (ref 0.0–0.2)
nRBC: 0 % (ref 0.0–0.2)

## 2021-05-06 LAB — TSH: TSH: 0.408 u[IU]/mL (ref 0.350–4.500)

## 2021-05-06 LAB — URINALYSIS, ROUTINE W REFLEX MICROSCOPIC
Bilirubin Urine: NEGATIVE
Glucose, UA: >=500 mg/dL — AB
Hgb urine dipstick: NEGATIVE
Ketones, ur: 5 mg/dL — AB
Nitrite: POSITIVE — AB
Protein, ur: 100 mg/dL — AB
Specific Gravity, Urine: 1.031 — ABNORMAL HIGH (ref 1.005–1.030)
pH: 8 (ref 5.0–8.0)

## 2021-05-06 LAB — RESP PANEL BY RT-PCR (FLU A&B, COVID) ARPGX2
Influenza A by PCR: NEGATIVE
Influenza B by PCR: NEGATIVE
SARS Coronavirus 2 by RT PCR: NEGATIVE

## 2021-05-06 LAB — HEPATIC FUNCTION PANEL
ALT: 14 U/L (ref 0–44)
AST: 15 U/L (ref 15–41)
Albumin: 3.8 g/dL (ref 3.5–5.0)
Alkaline Phosphatase: 89 U/L (ref 38–126)
Bilirubin, Direct: 0.3 mg/dL — ABNORMAL HIGH (ref 0.0–0.2)
Indirect Bilirubin: 1.4 mg/dL — ABNORMAL HIGH (ref 0.3–0.9)
Total Bilirubin: 1.7 mg/dL — ABNORMAL HIGH (ref 0.3–1.2)
Total Protein: 8.1 g/dL (ref 6.5–8.1)

## 2021-05-06 LAB — COMPREHENSIVE METABOLIC PANEL
ALT: 13 U/L (ref 0–44)
AST: 15 U/L (ref 15–41)
Albumin: 3.8 g/dL (ref 3.5–5.0)
Alkaline Phosphatase: 94 U/L (ref 38–126)
Anion gap: 10 (ref 5–15)
BUN: 15 mg/dL (ref 8–23)
CO2: 24 mmol/L (ref 22–32)
Calcium: 8.8 mg/dL — ABNORMAL LOW (ref 8.9–10.3)
Chloride: 100 mmol/L (ref 98–111)
Creatinine, Ser: 0.82 mg/dL (ref 0.44–1.00)
GFR, Estimated: >60 mL/min (ref >60)
Glucose, Bld: 364 mg/dL — ABNORMAL HIGH (ref 70–99)
Potassium: 3.7 mmol/L (ref 3.5–5.1)
Sodium: 134 mmol/L — ABNORMAL LOW (ref 135–145)
Total Bilirubin: 2.1 mg/dL — ABNORMAL HIGH (ref 0.3–1.2)
Total Protein: 7.9 g/dL (ref 6.5–8.1)

## 2021-05-06 LAB — LACTIC ACID, PLASMA: Lactic Acid, Venous: 1.7 mmol/L (ref 0.5–1.9)

## 2021-05-06 LAB — CK: Total CK: 110 U/L (ref 38–234)

## 2021-05-06 LAB — SODIUM, URINE, RANDOM: Sodium, Ur: 32 mmol/L

## 2021-05-06 LAB — GLUCOSE, CAPILLARY: Glucose-Capillary: 205 mg/dL — ABNORMAL HIGH (ref 70–99)

## 2021-05-06 LAB — TROPONIN I (HIGH SENSITIVITY): Troponin I (High Sensitivity): 13 ng/L (ref <18)

## 2021-05-06 LAB — CBG MONITORING, ED: Glucose-Capillary: 259 mg/dL — ABNORMAL HIGH (ref 70–99)

## 2021-05-06 LAB — PHOSPHORUS: Phosphorus: 3.4 mg/dL (ref 2.5–4.6)

## 2021-05-06 LAB — MAGNESIUM: Magnesium: 1.6 mg/dL — ABNORMAL LOW (ref 1.7–2.4)

## 2021-05-06 LAB — PROCALCITONIN: Procalcitonin: 0.93 ng/mL

## 2021-05-06 MED ORDER — INSULIN ASPART 100 UNIT/ML IJ SOLN
0.0000 [IU] | INTRAMUSCULAR | Status: DC
  Administered 2021-05-06: 5 [IU] via SUBCUTANEOUS
  Administered 2021-05-07 (×2): 3 [IU] via SUBCUTANEOUS
  Administered 2021-05-07: 19:00:00 5 [IU] via SUBCUTANEOUS
  Administered 2021-05-07: 09:00:00 2 [IU] via SUBCUTANEOUS
  Administered 2021-05-07: 21:00:00 5 [IU] via SUBCUTANEOUS
  Administered 2021-05-07: 04:00:00 3 [IU] via SUBCUTANEOUS
  Administered 2021-05-08 (×3): 2 [IU] via SUBCUTANEOUS
  Administered 2021-05-08: 12:00:00 3 [IU] via SUBCUTANEOUS
  Administered 2021-05-08 (×2): 5 [IU] via SUBCUTANEOUS
  Administered 2021-05-09 (×3): 2 [IU] via SUBCUTANEOUS
  Filled 2021-05-06: qty 0.09

## 2021-05-06 MED ORDER — CEFTRIAXONE SODIUM 1 G IJ SOLR
1.0000 g | Freq: Once | INTRAMUSCULAR | Status: AC
  Administered 2021-05-06: 1 g via INTRAVENOUS
  Filled 2021-05-06: qty 10

## 2021-05-06 MED ORDER — IOHEXOL 300 MG/ML  SOLN: 100.0000 mL | Freq: Once | INTRAMUSCULAR | Status: DC | PRN

## 2021-05-06 MED ORDER — LORAZEPAM 2 MG/ML IJ SOLN
1.0000 mg | Freq: Once | INTRAMUSCULAR | Status: AC
Start: 2021-05-06 — End: 2021-05-06
  Administered 2021-05-06: 1 mg via INTRAVENOUS
  Filled 2021-05-06: qty 1

## 2021-05-06 MED ORDER — SODIUM CHLORIDE 0.9 % IV SOLN
2.0000 g | INTRAVENOUS | Status: DC
  Administered 2021-05-07 – 2021-05-08 (×2): 2 g via INTRAVENOUS
  Filled 2021-05-06 (×2): qty 20

## 2021-05-06 MED ORDER — ROSUVASTATIN CALCIUM 10 MG PO TABS
20.0000 mg | ORAL_TABLET | Freq: Every day | ORAL | Status: DC
  Administered 2021-05-07 – 2021-05-08 (×2): 20 mg via ORAL
  Filled 2021-05-06 (×2): qty 2

## 2021-05-06 MED ORDER — FENOFIBRATE 54 MG PO TABS
54.0000 mg | ORAL_TABLET | Freq: Every day | ORAL | Status: DC
  Administered 2021-05-07 – 2021-05-08 (×2): 54 mg via ORAL
  Filled 2021-05-06 (×3): qty 1

## 2021-05-06 MED ORDER — SODIUM CHLORIDE 0.9 % IV SOLN
75.0000 mL/h | INTRAVENOUS | Status: AC
  Administered 2021-05-06: 75 mL/h via INTRAVENOUS

## 2021-05-06 MED ORDER — MAGNESIUM SULFATE IN D5W 1-5 GM/100ML-% IV SOLN
1.0000 g | Freq: Once | INTRAVENOUS | Status: AC
  Administered 2021-05-06: 1 g via INTRAVENOUS
  Filled 2021-05-06: qty 100

## 2021-05-06 MED ORDER — SODIUM CHLORIDE 0.9 % IV BOLUS
1000.0000 mL | Freq: Once | INTRAVENOUS | Status: AC
  Administered 2021-05-06: 1000 mL via INTRAVENOUS

## 2021-05-06 MED ORDER — ACETAMINOPHEN 325 MG PO TABS: 650.0000 mg | ORAL_TABLET | Freq: Four times a day (QID) | ORAL | Status: DC | PRN

## 2021-05-06 MED ORDER — NYSTATIN 100000 UNIT/GM EX POWD
Freq: Three times a day (TID) | CUTANEOUS | Status: DC
  Filled 2021-05-06: qty 15

## 2021-05-06 MED ORDER — ISOSORBIDE MONONITRATE ER 30 MG PO TB24
30.0000 mg | ORAL_TABLET | Freq: Every day | ORAL | Status: DC
Start: 2021-05-07 — End: 2021-05-09
  Administered 2021-05-07 – 2021-05-08 (×2): 30 mg via ORAL
  Filled 2021-05-06 (×2): qty 1

## 2021-05-06 MED ORDER — ACETAMINOPHEN 650 MG RE SUPP: 650.0000 mg | Freq: Four times a day (QID) | RECTAL | Status: DC | PRN

## 2021-05-06 MED ORDER — METOPROLOL SUCCINATE ER 50 MG PO TB24
50.0000 mg | ORAL_TABLET | Freq: Every day | ORAL | Status: DC
  Administered 2021-05-07 – 2021-05-08 (×2): 50 mg via ORAL
  Filled 2021-05-06 (×2): qty 1
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
## 2021-05-06 NOTE — H&P (Signed)
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                                    Diabetes care coordinator                                      Nutrition    consulted                  Wound care  consulted                   Consults  called: none   Admission status:  ED Disposition     ED Disposition  Admit   Condition  --   Comment  Hospital Area: [facility] [[id number]]  Level of Care: Telemetry [5]  Admit to tele based  on following criteria: Other see comments  Comments: sepsis  May place patient in observation at [facility] or [facility] if equivalent level of care is available:: No  Covid Evaluation: Confirmed COVID Negative  Diagnosis: Sepsis [facility]) [[id number]]  Admitting Physician: Toy Baker [[id number]]  Attending Physician: Toy Baker [[id number]]           Obs      Level of care     tele  For   24H     Lab Results  Component Value Date   [place] 05/06/2021     Precautions: admitted as   Covid Negative   [place] 05/07/2021, 2:19 AM    [facility]     after 2 AM please page floor coverage PA If 7AM-7PM, please contact the day team taking care of the patient using [email or web address]   Patient was evaluated in the context of the global COVID-19 pandemic, which necessitated consideration that the patient might be at risk for infection with the SARS-CoV-2 virus that causes COVID-19. Institutional protocols and algorithms that pertain to the evaluation of patients at risk for COVID-19 are in a state of rapid change based on information released by regulatory bodies including the CDC and federal and state organizations. These policies and algorithms were followed during the patient's care.
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
## 2021-05-07 ENCOUNTER — Observation Stay (HOSPITAL_BASED_OUTPATIENT_CLINIC_OR_DEPARTMENT_OTHER): Payer: Medicare Other

## 2021-05-07 ENCOUNTER — Observation Stay (HOSPITAL_COMMUNITY): Payer: Medicare Other

## 2021-05-07 DIAGNOSIS — N390 Urinary tract infection, site not specified: Secondary | ICD-10-CM

## 2021-05-07 DIAGNOSIS — R55 Syncope and collapse: Secondary | ICD-10-CM

## 2021-05-07 DIAGNOSIS — R609 Edema, unspecified: Secondary | ICD-10-CM

## 2021-05-07 DIAGNOSIS — B372 Candidiasis of skin and nail: Secondary | ICD-10-CM | POA: Diagnosis not present

## 2021-05-07 DIAGNOSIS — R059 Cough, unspecified: Secondary | ICD-10-CM

## 2021-05-07 DIAGNOSIS — R9431 Abnormal electrocardiogram [ECG] [EKG]: Secondary | ICD-10-CM | POA: Diagnosis not present

## 2021-05-07 DIAGNOSIS — A419 Sepsis, unspecified organism: Secondary | ICD-10-CM | POA: Diagnosis not present

## 2021-05-07 DIAGNOSIS — R051 Acute cough: Secondary | ICD-10-CM

## 2021-05-07 DIAGNOSIS — R7989 Other specified abnormal findings of blood chemistry: Secondary | ICD-10-CM | POA: Diagnosis present

## 2021-05-07 LAB — CBC WITH DIFFERENTIAL/PLATELET
Abs Immature Granulocytes: 0.04 10*3/uL (ref 0.00–0.07)
Basophils Absolute: 0 10*3/uL (ref 0.0–0.1)
Basophils Relative: 0 %
Eosinophils Absolute: 0.1 10*3/uL (ref 0.0–0.5)
Eosinophils Relative: 0 %
HCT: 40.5 % (ref 36.0–46.0)
Hemoglobin: 13 g/dL (ref 12.0–15.0)
Immature Granulocytes: 0 %
Lymphocytes Relative: 9 %
Lymphs Abs: 1.1 10*3/uL (ref 0.7–4.0)
MCH: 27.7 pg (ref 26.0–34.0)
MCHC: 32.1 g/dL (ref 30.0–36.0)
MCV: 86.2 fL (ref 80.0–100.0)
Monocytes Absolute: 1 10*3/uL (ref 0.1–1.0)
Monocytes Relative: 8 %
Neutro Abs: 10.2 10*3/uL — ABNORMAL HIGH (ref 1.7–7.7)
Neutrophils Relative %: 83 %
Platelets: 145 10*3/uL — ABNORMAL LOW (ref 150–400)
RBC: 4.7 MIL/uL (ref 3.87–5.11)
RDW: 14.1 % (ref 11.5–15.5)
WBC: 12.4 10*3/uL — ABNORMAL HIGH (ref 4.0–10.5)
nRBC: 0 % (ref 0.0–0.2)

## 2021-05-07 LAB — ECHOCARDIOGRAM COMPLETE
AR max vel: 1.61 cm2
AV Area VTI: 1.6 cm2
AV Area mean vel: 1.63 cm2
AV Mean grad: 9 mmHg
AV Peak grad: 17.1 mmHg
Ao pk vel: 2.07 m/s
Area-P 1/2: 3.33 cm2
MV VTI: 1.7 cm2
S' Lateral: 3.4 cm

## 2021-05-07 LAB — RESPIRATORY PANEL BY PCR

## 2021-05-07 LAB — GLUCOSE, CAPILLARY
Glucose-Capillary: 205 mg/dL — ABNORMAL HIGH (ref 70–99)
Glucose-Capillary: 217 mg/dL — ABNORMAL HIGH (ref 70–99)
Glucose-Capillary: 230 mg/dL — ABNORMAL HIGH (ref 70–99)
Glucose-Capillary: 267 mg/dL — ABNORMAL HIGH (ref 70–99)
Glucose-Capillary: 273 mg/dL — ABNORMAL HIGH (ref 70–99)

## 2021-05-07 LAB — COMPREHENSIVE METABOLIC PANEL
ALT: 12 U/L (ref 0–44)
AST: 14 U/L — ABNORMAL LOW (ref 15–41)
Albumin: 3.3 g/dL — ABNORMAL LOW (ref 3.5–5.0)
Alkaline Phosphatase: 75 U/L (ref 38–126)
Anion gap: 8 (ref 5–15)
BUN: 14 mg/dL (ref 8–23)
CO2: 25 mmol/L (ref 22–32)
Calcium: 8.3 mg/dL — ABNORMAL LOW (ref 8.9–10.3)
Chloride: 102 mmol/L (ref 98–111)
Creatinine, Ser: 0.62 mg/dL (ref 0.44–1.00)
GFR, Estimated: >60 mL/min (ref >60)
Glucose, Bld: 212 mg/dL — ABNORMAL HIGH (ref 70–99)
Potassium: 3.3 mmol/L — ABNORMAL LOW (ref 3.5–5.1)
Sodium: 135 mmol/L (ref 135–145)
Total Bilirubin: 2.1 mg/dL — ABNORMAL HIGH (ref 0.3–1.2)
Total Protein: 7.1 g/dL (ref 6.5–8.1)

## 2021-05-07 LAB — T4, FREE: Free T4: 0.89 ng/dL (ref 0.61–1.12)

## 2021-05-07 LAB — OSMOLALITY, URINE: Osmolality, Ur: 956 mOsm/kg — ABNORMAL HIGH (ref 300–900)

## 2021-05-07 LAB — LACTIC ACID, PLASMA: Lactic Acid, Venous: 1.2 mmol/L (ref 0.5–1.9)

## 2021-05-07 LAB — PHOSPHORUS: Phosphorus: 2.9 mg/dL (ref 2.5–4.6)

## 2021-05-07 LAB — TSH: TSH: 0.319 u[IU]/mL — ABNORMAL LOW (ref 0.350–4.500)

## 2021-05-07 LAB — TROPONIN I (HIGH SENSITIVITY): Troponin I (High Sensitivity): 13 ng/L (ref <18)

## 2021-05-07 LAB — MAGNESIUM: Magnesium: 2.1 mg/dL (ref 1.7–2.4)

## 2021-05-07 MED ORDER — INSULIN GLARGINE-YFGN 100 UNIT/ML ~~LOC~~ SOLN
8.0000 [IU] | Freq: Every day | SUBCUTANEOUS | Status: DC
  Administered 2021-05-07 – 2021-05-08 (×2): 8 [IU] via SUBCUTANEOUS
  Filled 2021-05-07 (×3): qty 0.08

## 2021-05-07 MED ORDER — PANTOPRAZOLE SODIUM 40 MG PO TBEC
40.0000 mg | DELAYED_RELEASE_TABLET | Freq: Every day | ORAL | Status: DC
  Administered 2021-05-07 – 2021-05-08 (×2): 40 mg via ORAL
  Filled 2021-05-07 (×2): qty 1

## 2021-05-07 MED ORDER — GUAIFENESIN ER 600 MG PO TB12
1200.0000 mg | ORAL_TABLET | Freq: Two times a day (BID) | ORAL | Status: DC
  Administered 2021-05-07 – 2021-05-08 (×4): 1200 mg via ORAL
  Filled 2021-05-07 (×4): qty 2

## 2021-05-07 MED ORDER — ORAL CARE MOUTH RINSE
15.0000 mL | Freq: Two times a day (BID) | OROMUCOSAL | Status: DC
  Administered 2021-05-07 – 2021-05-08 (×4): 15 mL via OROMUCOSAL

## 2021-05-07 MED ORDER — LORATADINE 10 MG PO TABS
10.0000 mg | ORAL_TABLET | Freq: Every day | ORAL | Status: DC
  Administered 2021-05-07 – 2021-05-08 (×2): 10 mg via ORAL
  Filled 2021-05-07 (×2): qty 1

## 2021-05-07 MED ORDER — LABETALOL HCL 5 MG/ML IV SOLN: 10.0000 mg | INTRAVENOUS | Status: DC | PRN

## 2021-05-07 MED ORDER — POTASSIUM CHLORIDE CRYS ER 10 MEQ PO TBCR
40.0000 meq | EXTENDED_RELEASE_TABLET | Freq: Once | ORAL | Status: AC
  Administered 2021-05-07: 09:00:00 40 meq via ORAL
  Filled 2021-05-07: qty 4

## 2021-05-07 MED ORDER — POTASSIUM CHLORIDE CRYS ER 20 MEQ PO TBCR
40.0000 meq | EXTENDED_RELEASE_TABLET | Freq: Once | ORAL | Status: AC
  Administered 2021-05-07: 03:00:00 40 meq via ORAL
  Filled 2021-05-07: qty 2

## 2021-05-07 MED ORDER — FLUTICASONE PROPIONATE 50 MCG/ACT NA SUSP
2.0000 | Freq: Every day | NASAL | Status: DC
  Administered 2021-05-07 – 2021-05-08 (×2): 2 via NASAL
  Filled 2021-05-07: qty 16

## 2021-05-07 MED ORDER — BENZONATATE 100 MG PO CAPS: 200.0000 mg | ORAL_CAPSULE | Freq: Three times a day (TID) | ORAL | Status: DC | PRN
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
## 2021-05-07 NOTE — Assessment & Plan Note (Signed)
Chronic. 
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
## 2021-05-07 NOTE — Progress Notes (Signed)
Inpatient Diabetes Program Recommendations  AACE/ADA: New Consensus Statement on Inpatient Glycemic Control (2015)  Target Ranges:  Prepandial:   less than 140 mg/dL      Peak postprandial:   less than 180 mg/dL (1-2 hours)      Critically ill patients:  140 - 180 mg/dL   Lab Results  Component Value Date   GLUCAP 230 (H) 05/07/2021   HGBA1C 9.9 (H) 01/21/2021    Review of Glycemic Control  Latest Reference Range & Units 05/06/21 21:21 05/06/21 23:39 05/07/21 04:00 05/07/21 07:34 05/07/21 12:49  Glucose-Capillary 70 - 99 mg/dL 259 (H) 205 (H) 205 (H) 217 (H) 230 (H)  (H): Data is abnormally high  Diabetes history: DM2 Outpatient Diabetes medications: Amaryl 4 mg  qd, Prandin 1 mg QD Current orders for Inpatient glycemic control: Novolog 0-9 units Q4H  Inpatient Diabetes Program Recommendations:    Novolog 0-9 units TID and 0-5 QHS Semglee 10 units QD  Will continue to follow while inpatient.  Thank you, Reche Dixon, MSN, RN Diabetes Coordinator Inpatient Diabetes Program [phone] (team pager from 8a-5p)

## 2021-05-07 NOTE — Assessment & Plan Note (Signed)
Current appears to be slightly on the dry side we will continue to monitor avoid fluid overload

## 2021-05-07 NOTE — Assessment & Plan Note (Signed)
Left worse than right.  We will order Dopplers of left lower extremity

## 2021-05-07 NOTE — Consult Note (Signed)
WOC Nurse Consult Note: Reason for Consult:Left leg wound Patient with venous stasis disease; hemosiderin staining bilaterally. Redness noted on the LLE; she reports she does wear stockings but not currently, she is using ACE wraps at home that she self applies Appears to have component of lymphedema based on presentation of LEs  Wound type: venous ulceration lateral right calf; venous ulceration left achilles area  Pressure Injury POA: NA Measurement: Right lateral: 2cm x 2cm x 0.2cm  Left achilles: 1cm x 1cm x 0.1cm  Wound bed: both pink and clean Drainage (amount, consistency, odor) scant, not purulent Periwound: edema; redness as described above in the LLE; mild venous dermatitis  Dressing procedure/placement/frequency: Silicone foam per skin care order set to the bilateral LE wounds; change every 3 days Add kerlix and 4" ACE wraps from toes to knees daily.    Re consult if needed, will not follow at this time. Thanks  Nicole Defino [employer], RN,CWOCN, CNS, [place] [phone])

## 2021-05-07 NOTE — Assessment & Plan Note (Signed)
Stable continue home medications 

## 2021-05-07 NOTE — Assessment & Plan Note (Signed)
-   Order Sensitive  SSI     -  check TSH and HgA1C  - Hold by mouth medications*  

## 2021-05-07 NOTE — Progress Notes (Signed)
LLE venous duplex has been completed.   Results can be found under chart review under CV PROC. 05/07/2021 1:40 PM Selma Mink RVT, RDMS

## 2021-05-07 NOTE — Assessment & Plan Note (Signed)
-   will monitor on tele avoid QT prolonging medications, rehydrate correct electrolytes ? ?

## 2021-05-07 NOTE — Assessment & Plan Note (Signed)
-   treat with Rocephin         await results of urine culture and adjust antibiotic coverage as needed  

## 2021-05-07 NOTE — Progress Notes (Signed)
EKG performed, will not transmit. Reads normal sinus rhythm, right bundle branch block.

## 2021-05-07 NOTE — Assessment & Plan Note (Signed)
-   will replace and repeat in AM,  check magnesium level and replace as needed ° °

## 2021-05-07 NOTE — Plan of Care (Signed)
No acute events overnight.  Problem: Education: Goal: Knowledge of General Education information will improve Description: Including pain rating scale, medication(s)/side effects and non-pharmacologic comfort measures Outcome: Progressing   Problem: Health Behavior/Discharge Planning: Goal: Ability to manage health-related needs will improve Outcome: Progressing   Problem: Clinical Measurements: Goal: Ability to maintain clinical measurements within normal limits will improve Outcome: Progressing Goal: Diagnostic test results will improve Outcome: Progressing Goal: Respiratory complications will improve Outcome: Progressing Goal: Cardiovascular complication will be avoided Outcome: Progressing   Problem: Activity: Goal: Risk for activity intolerance will decrease Outcome: Progressing   Problem: Nutrition: Goal: Adequate nutrition will be maintained Outcome: Progressing   Problem: Coping: Goal: Level of anxiety will decrease Outcome: Progressing   Problem: Elimination: Goal: Will not experience complications related to bowel motility Outcome: Progressing Goal: Will not experience complications related to urinary retention Outcome: Progressing   Problem: Pain Managment: Goal: General experience of comfort will improve Outcome: Progressing   Problem: Safety: Goal: Ability to remain free from injury will improve Outcome: Progressing   Problem: Skin Integrity: Goal: Risk for impaired skin integrity will decrease Outcome: Progressing

## 2021-05-07 NOTE — TOC CM/SW Note (Addendum)
°  Transition of Care [facility]) Screening Note   Patient Details  Name: KATORI WIRSING Date of Birth: 1955/07/01   Transition of Care [facility]) CM/SW Contact:    Ross Ludwig, LCSW Phone Number: 05/07/2021, 6:39 PM    Transition of Care Department [facility]) has reviewed patient and no TOC needs have been identified at this time. We will continue to monitor patient advancement through interdisciplinary progression rounds. If new patient transition needs arise, please place a TOC consult.

## 2021-05-07 NOTE — Assessment & Plan Note (Signed)
Follow-up as an outpatient with nutrition

## 2021-05-07 NOTE — Assessment & Plan Note (Signed)
Resume home medications when able to tolerate as needed labetalol for severe hypertension

## 2021-05-07 NOTE — Assessment & Plan Note (Signed)
Gently rehydrate and follow fluid status

## 2021-05-07 NOTE — Assessment & Plan Note (Signed)
Chronic stable continue home medications ?

## 2021-05-07 NOTE — Assessment & Plan Note (Signed)
Check T4 T3 levels

## 2021-05-07 NOTE — Assessment & Plan Note (Signed)
-  SIRS criteria met with  elevated white blood cell count,   initially tachycardic and elevated heart respiration rate now improving    Component Value Date/Time   WBC 12.4 (H) 05/07/2021 0036   LYMPHSABS 1.1 05/07/2021 0036      -Most likely source being  Urinary,    Patient meeting criteria for Severe sepsis with    evidence of end organ damage/organ dysfunction such as  Encephalopathy  - Obtain serial lactic acid and procalcitonin level.  - Initiated IV antibiotics in ER: Antibiotics Given (last 72 hours)    Date/Time Action Medication Dose Rate   05/06/21 1912 New Bag/Given   cefTRIAXone (ROCEPHIN) 1 g in sodium chloride 0.9 % 100 mL IVPB 1 g 200 mL/hr      Will continue    - await results of blood and urine culture  - Rehydrate aggressively  Intravenous fluids were administered    2:06 AM

## 2021-05-07 NOTE — Assessment & Plan Note (Signed)
Diffuse we will order nystatin powder

## 2021-05-08 ENCOUNTER — Inpatient Hospital Stay (HOSPITAL_COMMUNITY): Payer: Medicare Other

## 2021-05-08 DIAGNOSIS — Z20822 Contact with and (suspected) exposure to covid-19: Secondary | ICD-10-CM | POA: Diagnosis present

## 2021-05-08 DIAGNOSIS — N390 Urinary tract infection, site not specified: Secondary | ICD-10-CM | POA: Diagnosis present

## 2021-05-08 DIAGNOSIS — R531 Weakness: Secondary | ICD-10-CM | POA: Diagnosis present

## 2021-05-08 DIAGNOSIS — R42 Dizziness and giddiness: Secondary | ICD-10-CM | POA: Diagnosis not present

## 2021-05-08 DIAGNOSIS — Z28310 Unvaccinated for covid-19: Secondary | ICD-10-CM | POA: Diagnosis not present

## 2021-05-08 DIAGNOSIS — M1990 Unspecified osteoarthritis, unspecified site: Secondary | ICD-10-CM | POA: Diagnosis present

## 2021-05-08 DIAGNOSIS — Y92410 Unspecified street and highway as the place of occurrence of the external cause: Secondary | ICD-10-CM | POA: Diagnosis not present

## 2021-05-08 DIAGNOSIS — I4510 Unspecified right bundle-branch block: Secondary | ICD-10-CM | POA: Diagnosis present

## 2021-05-08 DIAGNOSIS — Z6834 Body mass index (BMI) 34.0-34.9, adult: Secondary | ICD-10-CM | POA: Diagnosis not present

## 2021-05-08 DIAGNOSIS — A419 Sepsis, unspecified organism: Secondary | ICD-10-CM | POA: Diagnosis not present

## 2021-05-08 DIAGNOSIS — Z809 Family history of malignant neoplasm, unspecified: Secondary | ICD-10-CM | POA: Diagnosis not present

## 2021-05-08 DIAGNOSIS — E860 Dehydration: Secondary | ICD-10-CM | POA: Diagnosis present

## 2021-05-08 DIAGNOSIS — I5032 Chronic diastolic (congestive) heart failure: Secondary | ICD-10-CM | POA: Diagnosis present

## 2021-05-08 DIAGNOSIS — I110 Hypertensive heart disease with heart failure: Secondary | ICD-10-CM | POA: Diagnosis present

## 2021-05-08 DIAGNOSIS — T383X6A Underdosing of insulin and oral hypoglycemic [antidiabetic] drugs, initial encounter: Secondary | ICD-10-CM | POA: Diagnosis present

## 2021-05-08 DIAGNOSIS — I878 Other specified disorders of veins: Secondary | ICD-10-CM | POA: Diagnosis present

## 2021-05-08 DIAGNOSIS — R6520 Severe sepsis without septic shock: Secondary | ICD-10-CM | POA: Diagnosis present

## 2021-05-08 DIAGNOSIS — G9341 Metabolic encephalopathy: Secondary | ICD-10-CM | POA: Diagnosis present

## 2021-05-08 DIAGNOSIS — R059 Cough, unspecified: Secondary | ICD-10-CM | POA: Diagnosis not present

## 2021-05-08 DIAGNOSIS — R0602 Shortness of breath: Secondary | ICD-10-CM | POA: Diagnosis not present

## 2021-05-08 DIAGNOSIS — E669 Obesity, unspecified: Secondary | ICD-10-CM | POA: Diagnosis present

## 2021-05-08 DIAGNOSIS — E876 Hypokalemia: Secondary | ICD-10-CM | POA: Diagnosis present

## 2021-05-08 DIAGNOSIS — E1165 Type 2 diabetes mellitus with hyperglycemia: Secondary | ICD-10-CM | POA: Diagnosis present

## 2021-05-08 DIAGNOSIS — R509 Fever, unspecified: Secondary | ICD-10-CM | POA: Diagnosis not present

## 2021-05-08 DIAGNOSIS — Z91128 Patient's intentional underdosing of medication regimen for other reason: Secondary | ICD-10-CM | POA: Diagnosis not present

## 2021-05-08 DIAGNOSIS — J45909 Unspecified asthma, uncomplicated: Secondary | ICD-10-CM | POA: Diagnosis present

## 2021-05-08 DIAGNOSIS — A4159 Other Gram-negative sepsis: Secondary | ICD-10-CM | POA: Diagnosis present

## 2021-05-08 DIAGNOSIS — E7800 Pure hypercholesterolemia, unspecified: Secondary | ICD-10-CM | POA: Diagnosis present

## 2021-05-08 DIAGNOSIS — I517 Cardiomegaly: Secondary | ICD-10-CM | POA: Diagnosis not present

## 2021-05-08 DIAGNOSIS — B372 Candidiasis of skin and nail: Secondary | ICD-10-CM | POA: Diagnosis present

## 2021-05-08 DIAGNOSIS — Z8249 Family history of ischemic heart disease and other diseases of the circulatory system: Secondary | ICD-10-CM | POA: Diagnosis not present

## 2021-05-08 DIAGNOSIS — Y92009 Unspecified place in unspecified non-institutional (private) residence as the place of occurrence of the external cause: Secondary | ICD-10-CM | POA: Diagnosis not present

## 2021-05-08 LAB — CBC WITH DIFFERENTIAL/PLATELET
Abs Immature Granulocytes: 0.02 10*3/uL (ref 0.00–0.07)
Basophils Absolute: 0 10*3/uL (ref 0.0–0.1)
Basophils Relative: 1 %
Eosinophils Absolute: 0.2 10*3/uL (ref 0.0–0.5)
Eosinophils Relative: 2 %
HCT: 38.3 % (ref 36.0–46.0)
Hemoglobin: 12.3 g/dL (ref 12.0–15.0)
Immature Granulocytes: 0 %
Lymphocytes Relative: 20 %
Lymphs Abs: 1.5 10*3/uL (ref 0.7–4.0)
MCH: 27.8 pg (ref 26.0–34.0)
MCHC: 32.1 g/dL (ref 30.0–36.0)
MCV: 86.5 fL (ref 80.0–100.0)
Monocytes Absolute: 0.6 10*3/uL (ref 0.1–1.0)
Monocytes Relative: 9 %
Neutro Abs: 5.1 10*3/uL (ref 1.7–7.7)
Neutrophils Relative %: 68 %
Platelets: 150 10*3/uL (ref 150–400)
RBC: 4.43 MIL/uL (ref 3.87–5.11)
RDW: 14 % (ref 11.5–15.5)
WBC: 7.4 10*3/uL (ref 4.0–10.5)
nRBC: 0 % (ref 0.0–0.2)

## 2021-05-08 LAB — GLUCOSE, CAPILLARY
Glucose-Capillary: 178 mg/dL — ABNORMAL HIGH (ref 70–99)
Glucose-Capillary: 185 mg/dL — ABNORMAL HIGH (ref 70–99)
Glucose-Capillary: 194 mg/dL — ABNORMAL HIGH (ref 70–99)
Glucose-Capillary: 200 mg/dL — ABNORMAL HIGH (ref 70–99)
Glucose-Capillary: 210 mg/dL — ABNORMAL HIGH (ref 70–99)
Glucose-Capillary: 260 mg/dL — ABNORMAL HIGH (ref 70–99)
Glucose-Capillary: 296 mg/dL — ABNORMAL HIGH (ref 70–99)

## 2021-05-08 LAB — HEMOGLOBIN A1C
Hgb A1c MFr Bld: 9.1 % — ABNORMAL HIGH (ref 4.8–5.6)
Hgb A1c MFr Bld: 9.2 % — ABNORMAL HIGH (ref 4.8–5.6)
Mean Plasma Glucose: 214 mg/dL
Mean Plasma Glucose: 217 mg/dL

## 2021-05-08 LAB — BASIC METABOLIC PANEL
Anion gap: 8 (ref 5–15)
BUN: 16 mg/dL (ref 8–23)
CO2: 25 mmol/L (ref 22–32)
Calcium: 8.5 mg/dL — ABNORMAL LOW (ref 8.9–10.3)
Chloride: 104 mmol/L (ref 98–111)
Creatinine, Ser: 0.77 mg/dL (ref 0.44–1.00)
GFR, Estimated: >60 mL/min (ref >60)
Glucose, Bld: 191 mg/dL — ABNORMAL HIGH (ref 70–99)
Potassium: 3.7 mmol/L (ref 3.5–5.1)
Sodium: 137 mmol/L (ref 135–145)

## 2021-05-08 LAB — T3: T3, Total: 74 ng/dL (ref 71–180)

## 2021-05-08 LAB — MAGNESIUM: Magnesium: 2.1 mg/dL (ref 1.7–2.4)

## 2021-05-08 MED ORDER — LORAZEPAM 2 MG/ML IJ SOLN
1.0000 mg | Freq: Once | INTRAMUSCULAR | Status: AC
  Administered 2021-05-08: 18:00:00 1 mg via INTRAVENOUS
  Filled 2021-05-08: qty 1

## 2021-05-08 MED ORDER — SODIUM CHLORIDE 0.9 % IV SOLN: INTRAVENOUS | Status: DC

## 2021-05-08 NOTE — Plan of Care (Signed)

## 2021-05-08 NOTE — Progress Notes (Signed)
°   05/08/21 1200  Mobility  Activity Ambulated with assistance in hallway  Level of Assistance Standby assist, set-up cues, supervision of patient - no hands on  Assistive Device Front wheel walker  Distance Ambulated (ft) 320 ft  Activity Response Tolerated well  $Mobility charge 1 Mobility   Pt agreeable to mobilize this morning. Ambulated about 371ft in hall with RW from home, tolerated well. No complaints. Pt felt that we was more "wobbly" than usual. Left pt in bed with call bell at side.  [place] Specialist Acute Rehab Services Office: [phone]

## 2021-05-08 NOTE — Progress Notes (Signed)
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 marked narrowing with moderate narrowing elsewhere in the lumbar spine, chronicity uncertain. No signs of acute fracture involving visualized axial or appendicular skeleton. Marked spinal degenerative changes  throughout the spine including thoracic in addition to lumbar spine. IMPRESSION: 1. Added density within the patient's abdominal pannus/lower abdominal fat in the suprapubic region, this is more likely granulation tissue. Correlate with any signs of inflammation or trauma in this location. 2. No acute traumatic injury to the chest, abdomen or pelvis on this noncontrast evaluation. 3. No pulmonary consolidation or pleural effusion. 4. L4-5 spinal stenosis  suggested due to disc bulging and marked facet hypertrophy. Correlate with any radicular symptoms. 5. Lobular hepatic contours. Correlate with any clinical or laboratory evidence of liver disease given fissural widening. No signs of splenomegaly. Aortic Atherosclerosis (ICD10-I70.0). Electronically Signed   By: Zetta Bills M.D.   On: 05/06/2021 20:14        Scheduled Meds:  fenofibrate  54 mg Oral Daily   fluticasone  2 spray Each Nare Daily   guaiFENesin  1,200 mg Oral BID   insulin aspart  0-9 Units Subcutaneous Q4H   insulin glargine-yfgn  8 Units Subcutaneous QHS   isosorbide mononitrate  30 mg Oral Daily   loratadine  10 mg Oral Daily   mouth rinse  15 mL Mouth Rinse BID   metoprolol succinate  50 mg Oral Daily   nystatin   Topical TID   pantoprazole  40 mg Oral Daily   rosuvastatin  20 mg Oral Daily   Continuous Infusions:  sodium chloride 100 mL/hr at 05/08/21 1241   cefTRIAXone (ROCEPHIN)  IV 2 g (05/07/21 2033)     LOS: 0 days     Georgette Shell, MD 05/08/2021, 2:58 PM

## 2021-05-09 LAB — URINE CULTURE: Culture: >=100000 — AB

## 2021-05-09 LAB — GLUCOSE, CAPILLARY
Glucose-Capillary: 171 mg/dL — ABNORMAL HIGH (ref 70–99)
Glucose-Capillary: 155 mg/dL — ABNORMAL HIGH (ref 70–99)

## 2021-05-09 MED ORDER — CEFDINIR 300 MG PO CAPS
300.0000 mg | ORAL_CAPSULE | Freq: Two times a day (BID) | ORAL | 0 refills | Status: DC
Start: 2021-05-09 — End: 2021-10-16

## 2021-05-09 NOTE — Discharge Summary (Signed)
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 Sheet for Patients:  EntrepreneurPulse.com.au  Fact Sheet for Healthcare Providers:  IncredibleEmployment.be  This test is no t yet approved or cleared by the [place] FDA and  has been authorized for detection and/or diagnosis of SARS-CoV-2 by FDA under an Emergency Use Authorization (EUA). This EUA will remain  in effect (meaning this test can be used) for the duration of the COVID-19 declaration under Section 564(b)(1) of the Act, 21 U.S.C.section 360bbb-3(b)(1), unless the authorization is terminated  or revoked sooner.       Influenza A  by PCR NEGATIVE NEGATIVE Final   Influenza B by PCR NEGATIVE NEGATIVE Final    Comment: (NOTE) The Xpert Xpress SARS-CoV-2/FLU/RSV plus assay is intended as an aid in the diagnosis of influenza from  Nasopharyngeal swab specimens and should not be used as a sole basis for treatment. Nasal washings and aspirates are unacceptable for Xpert Xpress SARS-CoV-2/FLU/RSV testing.  Fact Sheet for Patients: EntrepreneurPulse.com.au  Fact Sheet for Healthcare Providers: IncredibleEmployment.be  This test is not yet approved or cleared by the [place] FDA and has been authorized for detection and/or diagnosis of SARS-CoV-2 by FDA under an Emergency Use Authorization (EUA). This EUA will remain in effect (meaning this test can be used) for the duration of the COVID-19 declaration under Section 564(b)(1) of the Act, 21 U.S.C. section 360bbb-3(b)(1), unless the authorization is terminated or revoked.  Performed at [facility], [place] [address] [place], [place] [address]   Culture, blood (routine x 2)     Status: None (Preliminary result)   Collection Time: 05/06/21  7:10 PM   Specimen: Right Antecubital; Blood  Result Value Ref Range Status   Specimen Description   Final    RIGHT ANTECUBITAL Performed at [place] [address], [place], [place] [address]    Special Requests   Final    BOTTLES DRAWN AEROBIC AND ANAEROBIC Blood Culture adequate volume Performed at [place] [address] [place], [place] [address]    Culture   Final    NO GROWTH 3 DAYS Performed at [facility], [place] [address], [place], [place] [address]    Report Status PENDING  Incomplete  Culture, blood (routine x 2)     Status: None (Preliminary result)   Collection Time: 05/06/21  7:10 PM   Specimen: BLOOD LEFT HAND  Result Value Ref Range Status   Specimen Description   Final    BLOOD LEFT HAND Performed at [facility], [place] [address] [place], [place] [address]    Special Requests   Final    Blood Culture results may not be optimal due to an inadequate volume of blood received in culture  bottles BOTTLES DRAWN AEROBIC AND ANAEROBIC Performed at [facility], [place] [address], [place], [place] [address]    Culture   Final    NO GROWTH 3 DAYS Performed at [facility], [place] [address], [place], [place] [address]    Report Status PENDING  Incomplete  Urine Culture     Status: Abnormal   Collection Time: 05/06/21  9:14 PM   Specimen: Urine, Clean Catch  Result Value Ref Range Status   Specimen Description   Final    URINE, CLEAN CATCH Performed at [facility], [place] [address] [place], [place] [address]    Special Requests   Final    NONE Performed at [facility], [place] [address], [place],  [address]    Culture >=100,000 COLONIES/mL PROTEUS MIRABILIS (A)  Final   Report Status 05/09/2021 FINAL  Final  Respiratory (~20 pathogens) panel by PCR     Status: None   Collection Time: 05/07/21  1:00 AM   Specimen: Nasopharyngeal Swab; Respiratory  Result Value Ref Range Status   Adenovirus NOT DETECTED NOT DETECTED Final   Coronavirus 229E NOT DETECTED NOT DETECTED Final    Comment: (NOTE) The Coronavirus on the Respiratory Panel, DOES NOT test for the novel  Coronavirus (2019 nCoV)  Coronavirus HKU1 NOT DETECTED NOT DETECTED Final   Coronavirus NL63 NOT DETECTED NOT DETECTED Final   Coronavirus OC43 NOT DETECTED NOT DETECTED Final   Metapneumovirus NOT DETECTED NOT DETECTED Final   Rhinovirus / Enterovirus NOT DETECTED NOT DETECTED Final   Influenza A NOT DETECTED NOT DETECTED Final   Influenza B NOT DETECTED NOT DETECTED Final   Parainfluenza Virus 1 NOT DETECTED NOT DETECTED Final   Parainfluenza Virus 2 NOT DETECTED NOT DETECTED Final   Parainfluenza Virus 3 NOT DETECTED NOT DETECTED Final   Parainfluenza Virus 4 NOT DETECTED NOT DETECTED Final   Respiratory Syncytial Virus NOT DETECTED NOT DETECTED Final   Bordetella pertussis NOT DETECTED NOT DETECTED Final   Bordetella Parapertussis NOT  DETECTED NOT DETECTED Final   Chlamydophila pneumoniae NOT DETECTED NOT DETECTED Final   Mycoplasma pneumoniae NOT DETECTED NOT DETECTED Final    Comment: Performed at [facility], [place] [address], [place], [place] [address]     Labs: BNP (last 3 results) Recent Labs    06/13/20 1532  BNP 28.3   Basic Metabolic Panel: Recent Labs  Lab 05/06/21 1326 05/07/21 0036 05/08/21 0500  NA 134* 135 137  K 3.7 3.3* 3.7  CL 100 102 104  CO2 _0 GLUCOSE 364* 212* 191*  BUN _1 CREATININE 0.82 0.62 0.77  CALCIUM 8.8* 8.3* 8.5*  MG 1.6* 2.1 2.1  PHOS 3.4 2.9  --    Liver Function Tests: Recent Labs  Lab 05/06/21 1326 05/07/21 0036  AST 15   15 14*  ALT _2 ALKPHOS 89   94 75  BILITOT 1.7*   2.1* 2.1*  PROT 8.1   7.9 7.1  ALBUMIN 3.8   3.8 3.3*   No results for input(s): LIPASE, AMYLASE in the last 168 hours. No results for input(s): AMMONIA in the last 168 hours. CBC: Recent Labs  Lab 05/06/21 1326 05/06/21 2220 05/07/21 0036 05/08/21 0500  WBC 21.2* 14.0* 12.4* 7.4  NEUTROABS 19.5* 11.9* 10.2* 5.1  HGB 14.4 13.4 13.0 12.3  HCT 43.2 40.6 40.5 38.3  MCV 83.9 85.1 86.2 86.5  PLT 188 174 145* 150   Cardiac Enzymes: Recent Labs  Lab 05/06/21 2220  CKTOTAL 110   BNP: Invalid input(s): POCBNP CBG: Recent Labs  Lab 05/08/21 1549 05/08/21 2102 05/08/21 2336 05/09/21 0358 05/09/21 0802  GLUCAP 260* 296* 200* 171* 155*   D-Dimer No results for input(s): DDIMER in the last 72 hours. Hgb A1c Recent Labs    05/08/21 0500  HGBA1C 9.2*   Lipid Profile No results for input(s): CHOL, HDL, LDLCALC, TRIG, CHOLHDL, LDLDIRECT in the last 72 hours. Thyroid function studies Recent Labs    05/07/21 0036  TSH 0.319*   Anemia work up No results for input(s): VITAMINB12, FOLATE, FERRITIN, TIBC, IRON, RETICCTPCT in the last 72 hours. Urinalysis    Component Value Date/Time   COLORURINE YELLOW 05/06/2021 1803   APPEARANCEUR HAZY (A)  05/06/2021 1803   LABSPEC 1.031 (H) 05/06/2021 1803   PHURINE 8.0 05/06/2021 1803   GLUCOSEU >=500 (A) 05/06/2021 1803   HGBUR NEGATIVE 05/06/2021 1803   BILIRUBINUR NEGATIVE 05/06/2021 1803   KETONESUR 5 (A) 05/06/2021 1803   PROTEINUR 100 (A) 05/06/2021 1803   NITRITE POSITIVE (A) 05/06/2021 1803   LEUKOCYTESUR SMALL (A) 05/06/2021 1803   Sepsis Labs Invalid input(s): PROCALCITONIN,  WBC,  LACTICIDVEN Microbiology Recent Results (from the past 240 hour(s))  Resp Panel by  RT-PCR (Flu A&B, Covid) Nasopharyngeal Swab     Status: None   Collection Time: 05/06/21  1:27 PM   Specimen: Nasopharyngeal Swab; Nasopharyngeal(NP) swabs in vial transport medium  Result Value Ref Range Status   SARS Coronavirus 2 by RT PCR NEGATIVE NEGATIVE Final    Comment: (NOTE) SARS-CoV-2 target nucleic acids are NOT DETECTED.  The SARS-CoV-2 RNA is generally detectable in upper respiratory specimens during the acute phase of infection. The lowest concentration of SARS-CoV-2 viral copies this assay can detect is 138 copies/mL. A negative result does not preclude SARS-Cov-2 infection and should not be used as the sole basis for treatment or other patient management decisions. A negative result may occur with  improper specimen collection/handling, submission of specimen other than nasopharyngeal swab, presence of viral mutation(s) within the areas targeted by this assay, and inadequate number of viral copies(<138 copies/mL). A negative result must be combined with clinical observations, patient history, and epidemiological information. The expected result is Negative.  Fact Sheet for Patients:  EntrepreneurPulse.com.au  Fact Sheet for Healthcare Providers:  IncredibleEmployment.be  This test is no t yet approved or cleared by the [place] FDA and  has been authorized for detection and/or diagnosis of SARS-CoV-2 by FDA under an Emergency Use Authorization  (EUA). This EUA will remain  in effect (meaning this test can be used) for the duration of the COVID-19 declaration under Section 564(b)(1) of the Act, 21 U.S.C.section 360bbb-3(b)(1), unless the authorization is terminated  or revoked sooner.       Influenza A by PCR NEGATIVE NEGATIVE Final   Influenza B by PCR NEGATIVE NEGATIVE Final    Comment: (NOTE) The Xpert Xpress SARS-CoV-2/FLU/RSV plus assay is intended as an aid in the diagnosis of influenza from Nasopharyngeal swab specimens and should not be used as a sole basis for treatment. Nasal washings and aspirates are unacceptable for Xpert Xpress SARS-CoV-2/FLU/RSV testing.  Fact Sheet for Patients: EntrepreneurPulse.com.au  Fact Sheet for Healthcare Providers: IncredibleEmployment.be  This test is not yet approved or cleared by the [place] FDA and has been authorized for detection and/or diagnosis of SARS-CoV-2 by FDA under an Emergency Use Authorization (EUA). This EUA will remain in effect (meaning this test can be used) for the duration of the COVID-19 declaration under Section 564(b)(1) of the Act, 21 U.S.C. section 360bbb-3(b)(1), unless the authorization is terminated or revoked.  Performed at [facility], [place] [address] [place], [place] [address]   Culture, blood (routine x 2)     Status: None (Preliminary result)   Collection Time: 05/06/21  7:10 PM   Specimen: Right Antecubital; Blood  Result Value Ref Range Status   Specimen Description   Final    RIGHT ANTECUBITAL Performed at [facility] [address], [place], [place] [address]    Special Requests   Final    BOTTLES DRAWN AEROBIC AND ANAEROBIC Blood Culture adequate volume Performed at [facility] [address] [place], [place] [address]    Culture   Final    NO GROWTH 3 DAYS Performed at [facility], [place] [address], [place], [place]  [address]    Report Status PENDING  Incomplete  Culture, blood (routine x 2)     Status: None (Preliminary result)   Collection Time: 05/06/21  7:10 PM   Specimen: BLOOD LEFT HAND  Result Value Ref Range Status   Specimen Description   Final    BLOOD LEFT HAND Performed at [facility], [place] [address] [place], [place] [address]    Special Requests   Final    Blood Culture results may not be optimal due to an inadequate volume of blood received in culture bottles BOTTLES DRAWN AEROBIC AND ANAEROBIC Performed at [facility], [place] [address] [place], [place] [address]    Culture   Final    NO GROWTH 3 DAYS Performed at [facility], [place] [address], [place], [place] [address]    Report Status PENDING  Incomplete  Urine Culture     Status: Abnormal   Collection Time: 05/06/21  9:14 PM   Specimen: Urine, Clean Catch  Result Value Ref Range Status   Specimen Description   Final    URINE, CLEAN CATCH Performed at [facility], [place] [address] [place], [place] [address]    Special Requests   Final    NONE Performed at [facility], [place] [address], [place], [place] [address]    Culture >=100,000 COLONIES/mL PROTEUS MIRABILIS (A)  Final   Report Status 05/09/2021 FINAL  Final  Respiratory (~20 pathogens) panel by PCR     Status: None   Collection Time: 05/07/21  1:00 AM   Specimen: Nasopharyngeal Swab; Respiratory  Result Value Ref Range Status   Adenovirus NOT DETECTED NOT DETECTED Final   Coronavirus 229E NOT DETECTED NOT DETECTED Final    Comment: (NOTE) The Coronavirus on the Respiratory Panel, DOES NOT test for the novel  Coronavirus (2019 nCoV)    Coronavirus HKU1 NOT DETECTED NOT DETECTED Final   Coronavirus NL63 NOT DETECTED NOT DETECTED Final   Coronavirus OC43 NOT DETECTED NOT DETECTED Final   Metapneumovirus NOT DETECTED NOT DETECTED Final   Rhinovirus / Enterovirus NOT DETECTED NOT DETECTED Final    Influenza A NOT DETECTED NOT DETECTED Final   Influenza B NOT DETECTED NOT DETECTED Final   Parainfluenza Virus 1 NOT DETECTED NOT DETECTED Final   Parainfluenza Virus 2 NOT DETECTED NOT DETECTED Final   Parainfluenza Virus 3 NOT DETECTED NOT DETECTED Final   Parainfluenza Virus 4 NOT DETECTED NOT DETECTED Final   Respiratory Syncytial Virus NOT DETECTED NOT DETECTED Final   Bordetella pertussis NOT DETECTED NOT DETECTED Final   Bordetella Parapertussis NOT DETECTED NOT DETECTED Final   Chlamydophila pneumoniae NOT DETECTED NOT DETECTED Final   Mycoplasma pneumoniae NOT DETECTED NOT DETECTED Final    Comment: Performed at [facility], [place]. [address] [place], [place] [address]     Time coordinating discharge: 39  minutes  SIGNED:   Georgette Shell, MD  [facility] 05/09/2021, 1:41 PM

## 2021-05-11 LAB — CULTURE, BLOOD (ROUTINE X 2)
Culture: NO GROWTH
Culture: NO GROWTH
Special Requests: ADEQUATE

## 2021-05-13 DIAGNOSIS — I10 Essential (primary) hypertension: Secondary | ICD-10-CM | POA: Diagnosis not present

## 2021-05-13 DIAGNOSIS — R609 Edema, unspecified: Secondary | ICD-10-CM | POA: Diagnosis not present

## 2021-05-13 DIAGNOSIS — J45909 Unspecified asthma, uncomplicated: Secondary | ICD-10-CM | POA: Diagnosis not present

## 2021-05-13 DIAGNOSIS — E7800 Pure hypercholesterolemia, unspecified: Secondary | ICD-10-CM | POA: Diagnosis not present

## 2021-05-13 DIAGNOSIS — Z7984 Long term (current) use of oral hypoglycemic drugs: Secondary | ICD-10-CM | POA: Diagnosis not present

## 2021-05-13 DIAGNOSIS — N390 Urinary tract infection, site not specified: Secondary | ICD-10-CM | POA: Diagnosis not present

## 2021-05-13 DIAGNOSIS — A419 Sepsis, unspecified organism: Secondary | ICD-10-CM | POA: Diagnosis not present

## 2021-09-30 ENCOUNTER — Other Ambulatory Visit (HOSPITAL_COMMUNITY): Payer: Self-pay

## 2021-10-03 ENCOUNTER — Other Ambulatory Visit (HOSPITAL_COMMUNITY): Payer: Self-pay

## 2021-10-13 ENCOUNTER — Emergency Department (HOSPITAL_COMMUNITY)
Admission: EM | Admit: 2021-10-13 | Discharge: 2021-10-13 | Disposition: A | Discharge status: Home / Self Care | Payer: Medicare Other | Attending: Emergency Medicine | Admitting: Emergency Medicine

## 2021-10-13 ENCOUNTER — Emergency Department (HOSPITAL_COMMUNITY): Payer: Medicare Other

## 2021-10-13 ENCOUNTER — Other Ambulatory Visit: Payer: Self-pay

## 2021-10-13 ENCOUNTER — Encounter (HOSPITAL_COMMUNITY): Payer: Self-pay

## 2021-10-13 DIAGNOSIS — Z20822 Contact with and (suspected) exposure to covid-19: Secondary | ICD-10-CM | POA: Diagnosis not present

## 2021-10-13 DIAGNOSIS — T679XXA Effect of heat and light, unspecified, initial encounter: Secondary | ICD-10-CM

## 2021-10-13 DIAGNOSIS — Z91040 Latex allergy status: Secondary | ICD-10-CM | POA: Diagnosis not present

## 2021-10-13 DIAGNOSIS — I10 Essential (primary) hypertension: Secondary | ICD-10-CM

## 2021-10-13 DIAGNOSIS — R0602 Shortness of breath: Secondary | ICD-10-CM | POA: Insufficient documentation

## 2021-10-13 DIAGNOSIS — T671XXA Heat syncope, initial encounter: Secondary | ICD-10-CM | POA: Insufficient documentation

## 2021-10-13 DIAGNOSIS — Z79899 Other long term (current) drug therapy: Secondary | ICD-10-CM | POA: Diagnosis not present

## 2021-10-13 DIAGNOSIS — X30XXXA Exposure to excessive natural heat, initial encounter: Secondary | ICD-10-CM | POA: Diagnosis not present

## 2021-10-13 DIAGNOSIS — R55 Syncope and collapse: Secondary | ICD-10-CM | POA: Diagnosis not present

## 2021-10-13 LAB — BRAIN NATRIURETIC PEPTIDE: B Natriuretic Peptide: 49.8 pg/mL (ref 0.0–100.0)

## 2021-10-13 LAB — CBC WITH DIFFERENTIAL/PLATELET
Abs Immature Granulocytes: 0.02 10*3/uL (ref 0.00–0.07)
Basophils Absolute: 0 10*3/uL (ref 0.0–0.1)
Basophils Relative: 1 %
Eosinophils Absolute: 0.2 10*3/uL (ref 0.0–0.5)
Eosinophils Relative: 2 %
HCT: 44.5 % (ref 36.0–46.0)
Hemoglobin: 14.9 g/dL (ref 12.0–15.0)
Immature Granulocytes: 0 %
Lymphocytes Relative: 12 %
Lymphs Abs: 0.7 10*3/uL (ref 0.7–4.0)
MCH: 28.9 pg (ref 26.0–34.0)
MCHC: 33.5 g/dL (ref 30.0–36.0)
MCV: 86.2 fL (ref 80.0–100.0)
Monocytes Absolute: 0.3 10*3/uL (ref 0.1–1.0)
Monocytes Relative: 5 %
Neutro Abs: 4.9 10*3/uL (ref 1.7–7.7)
Neutrophils Relative %: 80 %
Platelets: 214 10*3/uL (ref 150–400)
RBC: 5.16 MIL/uL — ABNORMAL HIGH (ref 3.87–5.11)
RDW: 14.2 % (ref 11.5–15.5)
WBC: 6.2 10*3/uL (ref 4.0–10.5)
nRBC: 0 % (ref 0.0–0.2)

## 2021-10-13 LAB — COMPREHENSIVE METABOLIC PANEL
ALT: 11 U/L (ref 0–44)
AST: 17 U/L (ref 15–41)
Albumin: 3.5 g/dL (ref 3.5–5.0)
Alkaline Phosphatase: 108 U/L (ref 38–126)
Anion gap: 9 (ref 5–15)
BUN: 11 mg/dL (ref 8–23)
CO2: 24 mmol/L (ref 22–32)
Calcium: 9.1 mg/dL (ref 8.9–10.3)
Chloride: 104 mmol/L (ref 98–111)
Creatinine, Ser: 0.94 mg/dL (ref 0.44–1.00)
GFR, Estimated: >60 mL/min (ref >60)
Glucose, Bld: 324 mg/dL — ABNORMAL HIGH (ref 70–99)
Potassium: 3.6 mmol/L (ref 3.5–5.1)
Sodium: 137 mmol/L (ref 135–145)
Total Bilirubin: 1.3 mg/dL — ABNORMAL HIGH (ref 0.3–1.2)
Total Protein: 7.8 g/dL (ref 6.5–8.1)

## 2021-10-13 LAB — TROPONIN I (HIGH SENSITIVITY)
Troponin I (High Sensitivity): 6 ng/L (ref <18)
Troponin I (High Sensitivity): 9 ng/L (ref <18)

## 2021-10-13 LAB — SARS CORONAVIRUS 2 BY RT PCR: SARS Coronavirus 2 by RT PCR: NEGATIVE

## 2021-10-13 LAB — LACTIC ACID, PLASMA: Lactic Acid, Venous: 1.7 mmol/L (ref 0.5–1.9)

## 2021-10-13 MED ORDER — LABETALOL HCL 5 MG/ML IV SOLN
20.0000 mg | Freq: Once | INTRAVENOUS | Status: AC
  Administered 2021-10-13: 20 mg via INTRAVENOUS
  Filled 2021-10-13: qty 4

## 2021-10-13 NOTE — ED Triage Notes (Signed)
Patient c/o SOB and heat exhaustion. Patient states her air conditioning is not working well.   Patient also c/o her left lower leg is draining blood and fluid x 2 days.

## 2021-10-13 NOTE — ED Provider Triage Note (Signed)
Emergency Medicine Provider Triage Evaluation Note  Amanda Butler , a 66 y.o. female  was evaluated in triage.  Pt complains of shortness of breath and syncope.  Patient reports that the air conditioning in her home when out today she felt like she was becoming overheated.  Patient states that she started having shortness of breath and became lightheaded.  Patient sat down and had a syncopal episode.  Patient states that complaints are syncopal episode which lasted 10 to 15 minutes.  Episode was unwitnessed.  Patient also reports that she is having swelling to left lower extremity with discharge.  Patient reports history of chronic lymphedema however states that her swelling has got worse over the last few days and leg is now leaking fluid.  Review of Systems  Positive: Leg swelling, lightheadedness, syncope, nausea Negative: Fever, chills, headache, vomiting, diarrhea, blood in stool, melena, abdominal pain, palpitations  Physical Exam  BP (!) 217/86 (BP Location: Right Arm)   Pulse 92   Temp 98.3 F (36.8 C) (Oral)   Resp 16   Ht '5\' 6"'$  (1.676 m)   Wt 95.3 kg   SpO2 97%   BMI 33.89 kg/m  Gen:   Awake, no distress   Resp:  Normal effort, clear to auscultation bilaterally MSK:   Moves extremities without difficulty; lymphedema to bilateral lower extremities with left greater than right.  Weeping noted to left lower extremity. Other:  Abdomen soft, nondistended, nontender with no guarding or rebound tenderness.  +2 radial pulse bilaterally.  Unable to fully evaluate patient's lower extremities due to clothing.  Medical Decision Making  Medically screening exam initiated at 3:42 PM.  Appropriate orders placed.  Amanda Butler was informed that the remainder of the evaluation will be completed by another provider, this initial triage assessment does not replace that evaluation, and the importance of remaining in the ED until their evaluation is complete.  With history of heart failure  we will check BNP and chest x-ray.  Due to reports of syncope will add on basic lab work as well as troponin.  Suspect that patient's swelling is secondary to lymphedema however will add on lactic acid as lower legs cannot be fully evaluated.   Loni Beckwith, [place] 10/13/21 1545

## 2021-10-13 NOTE — ED Provider Notes (Signed)
[facility] DEPT Provider Note   CSN: [id number] Arrival date & time: 10/13/21  1438     History  Chief Complaint  Patient presents with   Shortness of Breath   Heat Exposure    Amanda Butler is a 66 y.o. female.  She is here with a complaint of shortness of breath and syncope after being overheated in her home.  She said the air conditioning is not working well and she began to feel very hot.  She tried drinking water without improvement.  She is also had some drainage from her left leg that is a chronic issue but worsened recently.  She thinks she might of had a fever.  She said she has been taking her medications regularly.  The history is provided by the patient.  Shortness of Breath Severity:  Moderate Onset quality:  Gradual Duration:  1 day Timing:  Constant Progression:  Unchanged Chronicity:  Recurrent Context: weather changes   Relieved by:  Nothing Worsened by:  Activity Ineffective treatments:  None tried Associated symptoms: fever and syncope   Associated symptoms: no abdominal pain, no chest pain and no cough        Home Medications Prior to Admission medications   Medication Sig Start Date End Date Taking? Authorizing Provider  acetaminophen (TYLENOL) 500 MG tablet Take 1,000 mg by mouth every 6 (six) hours as needed for headache or fever (pain).    [provider]  albuterol (PROVENTIL) (2.5 MG/3ML) 0.083% nebulizer solution Take 2.5 mg by nebulization every 6 (six) hours as needed for wheezing or shortness of breath. Patient not taking: Reported on 05/07/2021    [provider]  albuterol (VENTOLIN HFA) 108 (90 Base) MCG/ACT inhaler Inhale 2 puffs into the lungs every 6 (six) hours as needed for shortness of breath or wheezing. 01/14/21   [provider]  cefdinir (OMNICEF) 300 MG capsule Take 1 capsule (300 mg total) by mouth 2 (two) times daily. 05/09/21   Georgette Shell, MD  fenofibrate 54 MG  tablet Take 1 tablet (54 mg total) by mouth daily. Patient taking differently: Take 54 mg by mouth every morning. 01/24/21 05/07/21  Mariel Aloe, MD  glimepiride (AMARYL) 4 MG tablet Take 1 tablet (4 mg total) by mouth every morning. 11/19/18 02/10/22  Radene Gunning, NP  isosorbide mononitrate (IMDUR) 30 MG 24 hr tablet Take 1 tablet (30 mg total) by mouth daily. Patient taking differently: Take 30 mg by mouth every morning. 01/24/21 05/07/21  Mariel Aloe, MD  metoprolol succinate (TOPROL-XL) 50 MG 24 hr tablet Take 50 mg by mouth every morning. Take with or immediately following a meal.    [provider]  repaglinide (PRANDIN) 1 MG tablet Take 1 mg by mouth 2 (two) times daily before a meal. 08/19/18   [provider]  rosuvastatin (CRESTOR) 20 MG tablet Take 20 mg by mouth every morning.    [provider]  Skin Protectants, Misc. (EUCERIN) cream Apply 1 application topically daily as needed for dry skin.    [provider]  triamterene-hydrochlorothiazide (MAXZIDE) 75-50 MG per tablet Take 1 tablet by mouth every morning.    [provider]      Allergies    Bee venom, Iodine, Other, Shellfish allergy, Actos [pioglitazone], Aspirin, Celecoxib, Latex, Metformin and related, Morphine and related, Nsaids, and Ciprofloxacin    Review of Systems   Review of Systems  Constitutional:  Positive for fatigue and fever.  Respiratory:  Positive for shortness of breath. Negative for cough.   Cardiovascular:  Positive for leg swelling and syncope. Negative for chest pain.  Gastrointestinal:  Negative for abdominal pain.  Skin:  Positive for wound.  Neurological:  Positive for syncope.    Physical Exam Updated Vital Signs BP (!) 170/86   Pulse 82   Temp 98.3 F (36.8 C) (Oral)   Resp (!) 22   Ht '5\' 6"'$  (1.676 m)   Wt 95.3 kg   SpO2 98%   BMI 33.89 kg/m  Physical Exam Vitals and nursing note reviewed.  Constitutional:      General: She is not  in acute distress.    Appearance: She is well-developed. She is obese.  HENT:     Head: Normocephalic and atraumatic.  Eyes:     Conjunctiva/sclera: Conjunctivae normal.  Cardiovascular:     Rate and Rhythm: Normal rate and regular rhythm.     Heart sounds: No murmur heard. Pulmonary:     Effort: Pulmonary effort is normal. No respiratory distress.     Breath sounds: Normal breath sounds.  Abdominal:     Palpations: Abdomen is soft.     Tenderness: There is no abdominal tenderness.  Musculoskeletal:        General: No swelling.     Cervical back: Neck supple.     Right lower leg: Edema present.     Left lower leg: Edema present.     Comments: She has edema of both her legs.  There are some excoriated skin and some weeping.  No evidence of cellulitis.  Skin:    General: Skin is warm and dry.     Capillary Refill: Capillary refill takes less than 2 seconds.  Neurological:     General: No focal deficit present.     Mental Status: She is alert.     ED Results / Procedures / Treatments   Labs (all labs ordered are listed, but only abnormal results are displayed) Labs Reviewed  COMPREHENSIVE METABOLIC PANEL - Abnormal; Notable for the following components:      Result Value   Glucose, Bld 324 (*)    Total Bilirubin 1.3 (*)    All other components within normal limits  CBC WITH DIFFERENTIAL/PLATELET - Abnormal; Notable for the following components:   RBC 5.16 (*)    All other components within normal limits  SARS CORONAVIRUS 2 BY RT PCR  BRAIN NATRIURETIC PEPTIDE  LACTIC ACID, PLASMA  TROPONIN I (HIGH SENSITIVITY)  TROPONIN I (HIGH SENSITIVITY)    EKG EKG Interpretation  Date/Time:  Sunday October 13 2021 15:48:06 EDT Ventricular Rate:  87 PR Interval:  174 QRS Duration: 134 QT Interval:  392 QTC Calculation: 472 R Axis:   -31 Text Interpretation: Sinus rhythm Left atrial enlargement Right bundle branch block Left ventricular hypertrophy Baseline wander in lead(s) V1  No significant change since prior 1/23 Confirmed by Aletta Edouard [phone]) on 10/13/2021 3:54:50 PM  Radiology DG Chest 2 View  Result Date: 10/13/2021 CLINICAL DATA:  Shortness of breath, heat exhaustion, history CHF, LEFT lower leg draining blood and fluid for 2 days EXAM: CHEST - 2 VIEW COMPARISON:  05/08/2021 FINDINGS: Upper normal heart size. Mediastinal contours and pulmonary vascularity normal. Lungs clear. No pulmonary infiltrate, pleural effusion, or pneumothorax. Degenerative disc disease changes thoracic spine. IMPRESSION: No acute abnormalities. Electronically Signed   By: Lavonia Dana M.D.   On: 10/13/2021 16:04    Procedures Procedures    Medications Ordered in ED Medications  labetalol (NORMODYNE) injection 20 mg (20 mg Intravenous Given 10/13/21 2020)    ED Course/ Medical Decision Making/ A&P Clinical Course as of 10/14/21 1143  Sun Oct 13, 2021  1839 Patient's lab work does not show any significant findings requiring admission.  Patient is reassured and she is comfortable plan for discharge.  She says she has her own car and she is comfortable with driving home.  Return instructions discussed [MB]    Clinical Course User Index [MB] Hayden Rasmussen, MD                           Medical Decision Making Risk Prescription drug management.   This patient complains of exposure, shortness of breath likely pain; this involves an extensive number of treatment Options and is a complaint that carries with it a high risk of complications and morbidity. The differential includes heat exposure, ACS, pneumonia, CHF, DVT, cellulitis hypertensive emergency  I ordered, reviewed and interpreted labs, which included CBC normal, hemoglobin normal, chemistries normal other than elevated glucose, lactate normal, troponin and BNP normal, COVID-negative I ordered medication IV labetalol for patient's elevated blood pressure and reviewed PMP when indicated. I ordered imaging studies which  included chest x-ray and I independently    visualized and interpreted imaging which showed no acute findings  Previous records obtained and reviewed in epic eluding last discharge summary in January  Cardiac monitoring reviewed, normal sinus rhythm Social determinants considered, no significant barriers Critical Interventions: None  After the interventions stated above, I reevaluated the patient and found patient to be symptomatically improved now being inside with a cool air Admission and further testing considered, no indications for admission or further work-up at this time.  Counseled patient to continue her regular medications follow-up with PCP.  Return instructions discussed         Final Clinical Impression(s) / ED Diagnoses Final diagnoses:  Heat exposure, initial encounter  Shortness of breath  Heat syncope, initial encounter  Primary hypertension    Rx / DC Orders ED Discharge Orders     None         Hayden Rasmussen, MD 10/14/21 1145

## 2021-10-13 NOTE — ED Notes (Signed)
Pt d/c home per MD order. Discharge summary reviewed, pt verbalizes understanding. Off unit via WC ; No s/s of acute distress noted. This RN offered to call pt a taxi, pt refused.

## 2021-10-13 NOTE — Discharge Instructions (Signed)
You were seen in the emergency department for evaluation of feeling short of breath and overheated and syncope.  You had blood work done along with EKG and chest x-ray.  Your blood sugar was elevated.  Your blood pressure is also high here.  Please continue your regular medications and follow-up with your regular doctor.  Keep well-hydrated.  Return to the emergency department if any worsening or concerning symptoms.

## 2021-10-15 ENCOUNTER — Encounter (HOSPITAL_COMMUNITY): Payer: Self-pay

## 2021-10-15 ENCOUNTER — Observation Stay (HOSPITAL_COMMUNITY)
Admission: EM | Admit: 2021-10-15 | Discharge: 2021-10-16 | Disposition: A | Discharge status: Home / Self Care | Payer: Medicare Other | Attending: Emergency Medicine | Admitting: Emergency Medicine

## 2021-10-15 ENCOUNTER — Emergency Department (HOSPITAL_COMMUNITY): Payer: Medicare Other

## 2021-10-15 ENCOUNTER — Other Ambulatory Visit: Payer: Self-pay

## 2021-10-15 DIAGNOSIS — E119 Type 2 diabetes mellitus without complications: Secondary | ICD-10-CM | POA: Diagnosis not present

## 2021-10-15 DIAGNOSIS — R079 Chest pain, unspecified: Secondary | ICD-10-CM

## 2021-10-15 DIAGNOSIS — Z91199 Patient's noncompliance with other medical treatment and regimen due to unspecified reason: Secondary | ICD-10-CM

## 2021-10-15 DIAGNOSIS — Z79899 Other long term (current) drug therapy: Secondary | ICD-10-CM | POA: Insufficient documentation

## 2021-10-15 DIAGNOSIS — I5033 Acute on chronic diastolic (congestive) heart failure: Secondary | ICD-10-CM

## 2021-10-15 DIAGNOSIS — I160 Hypertensive urgency: Secondary | ICD-10-CM | POA: Diagnosis not present

## 2021-10-15 DIAGNOSIS — R0600 Dyspnea, unspecified: Secondary | ICD-10-CM | POA: Insufficient documentation

## 2021-10-15 DIAGNOSIS — I10 Essential (primary) hypertension: Secondary | ICD-10-CM | POA: Insufficient documentation

## 2021-10-15 DIAGNOSIS — R600 Localized edema: Secondary | ICD-10-CM | POA: Diagnosis present

## 2021-10-15 DIAGNOSIS — I110 Hypertensive heart disease with heart failure: Secondary | ICD-10-CM | POA: Insufficient documentation

## 2021-10-15 DIAGNOSIS — R42 Dizziness and giddiness: Secondary | ICD-10-CM | POA: Diagnosis not present

## 2021-10-15 DIAGNOSIS — R0602 Shortness of breath: Secondary | ICD-10-CM | POA: Diagnosis not present

## 2021-10-15 DIAGNOSIS — I5032 Chronic diastolic (congestive) heart failure: Secondary | ICD-10-CM | POA: Diagnosis not present

## 2021-10-15 DIAGNOSIS — Z7984 Long term (current) use of oral hypoglycemic drugs: Secondary | ICD-10-CM | POA: Diagnosis not present

## 2021-10-15 DIAGNOSIS — I119 Hypertensive heart disease without heart failure: Secondary | ICD-10-CM | POA: Diagnosis not present

## 2021-10-15 DIAGNOSIS — J45909 Unspecified asthma, uncomplicated: Secondary | ICD-10-CM | POA: Diagnosis not present

## 2021-10-15 DIAGNOSIS — Z91040 Latex allergy status: Secondary | ICD-10-CM | POA: Diagnosis not present

## 2021-10-15 DIAGNOSIS — L8990 Pressure ulcer of unspecified site, unspecified stage: Secondary | ICD-10-CM | POA: Diagnosis present

## 2021-10-15 DIAGNOSIS — R0789 Other chest pain: Secondary | ICD-10-CM | POA: Diagnosis not present

## 2021-10-15 DIAGNOSIS — I878 Other specified disorders of veins: Secondary | ICD-10-CM | POA: Diagnosis present

## 2021-10-15 DIAGNOSIS — I509 Heart failure, unspecified: Secondary | ICD-10-CM

## 2021-10-15 DIAGNOSIS — E11620 Type 2 diabetes mellitus with diabetic dermatitis: Secondary | ICD-10-CM

## 2021-10-15 LAB — HEPATIC FUNCTION PANEL
ALT: 11 U/L (ref 0–44)
AST: 13 U/L — ABNORMAL LOW (ref 15–41)
Albumin: 3.7 g/dL (ref 3.5–5.0)
Alkaline Phosphatase: 109 U/L (ref 38–126)
Bilirubin, Direct: 0.3 mg/dL — ABNORMAL HIGH (ref 0.0–0.2)
Indirect Bilirubin: 1.4 mg/dL — ABNORMAL HIGH (ref 0.3–0.9)
Total Bilirubin: 1.7 mg/dL — ABNORMAL HIGH (ref 0.3–1.2)
Total Protein: 8 g/dL (ref 6.5–8.1)

## 2021-10-15 LAB — GLUCOSE, CAPILLARY
Glucose-Capillary: 337 mg/dL — ABNORMAL HIGH (ref 70–99)
Glucose-Capillary: 213 mg/dL — ABNORMAL HIGH (ref 70–99)

## 2021-10-15 LAB — CBC
HCT: 43.8 % (ref 36.0–46.0)
Hemoglobin: 14.4 g/dL (ref 12.0–15.0)
MCH: 27.9 pg (ref 26.0–34.0)
MCHC: 32.9 g/dL (ref 30.0–36.0)
MCV: 84.9 fL (ref 80.0–100.0)
Platelets: 213 10*3/uL (ref 150–400)
RBC: 5.16 MIL/uL — ABNORMAL HIGH (ref 3.87–5.11)
RDW: 14.2 % (ref 11.5–15.5)
WBC: 5.9 10*3/uL (ref 4.0–10.5)
nRBC: 0 % (ref 0.0–0.2)

## 2021-10-15 LAB — BASIC METABOLIC PANEL
Anion gap: 9 (ref 5–15)
BUN: 14 mg/dL (ref 8–23)
CO2: 24 mmol/L (ref 22–32)
Calcium: 8.9 mg/dL (ref 8.9–10.3)
Chloride: 105 mmol/L (ref 98–111)
Creatinine, Ser: 0.89 mg/dL (ref 0.44–1.00)
GFR, Estimated: >60 mL/min (ref >60)
Glucose, Bld: 359 mg/dL — ABNORMAL HIGH (ref 70–99)
Potassium: 3.6 mmol/L (ref 3.5–5.1)
Sodium: 138 mmol/L (ref 135–145)

## 2021-10-15 LAB — LIPID PANEL
Cholesterol: 177 mg/dL (ref 0–200)
HDL: 40 mg/dL — ABNORMAL LOW (ref >40)
LDL Cholesterol: 123 mg/dL — ABNORMAL HIGH (ref 0–99)
Total CHOL/HDL Ratio: 4.4 RATIO
Triglycerides: 71 mg/dL (ref <150)
VLDL: 14 mg/dL (ref 0–40)

## 2021-10-15 LAB — TROPONIN I (HIGH SENSITIVITY)
Troponin I (High Sensitivity): 9 ng/L (ref <18)
Troponin I (High Sensitivity): 10 ng/L (ref <18)

## 2021-10-15 LAB — CBG MONITORING, ED
Glucose-Capillary: 278 mg/dL — ABNORMAL HIGH (ref 70–99)
Glucose-Capillary: 339 mg/dL — ABNORMAL HIGH (ref 70–99)

## 2021-10-15 LAB — BRAIN NATRIURETIC PEPTIDE: B Natriuretic Peptide: 78.6 pg/mL (ref 0.0–100.0)

## 2021-10-15 MED ORDER — INSULIN ASPART 100 UNIT/ML IJ SOLN
0.0000 [IU] | Freq: Three times a day (TID) | INTRAMUSCULAR | Status: DC
  Administered 2021-10-15: 15 [IU] via SUBCUTANEOUS
  Administered 2021-10-16 (×2): 7 [IU] via SUBCUTANEOUS
  Filled 2021-10-15: qty 0.2

## 2021-10-15 MED ORDER — LISINOPRIL 20 MG PO TABS
40.0000 mg | ORAL_TABLET | Freq: Every day | ORAL | Status: DC
Start: 2021-10-15 — End: 2021-10-16
  Administered 2021-10-15 – 2021-10-16 (×2): 40 mg via ORAL
  Filled 2021-10-15 (×2): qty 2

## 2021-10-15 MED ORDER — ENOXAPARIN SODIUM 40 MG/0.4ML IJ SOSY
40.0000 mg | PREFILLED_SYRINGE | Freq: Every day | INTRAMUSCULAR | Status: DC
  Administered 2021-10-15: 40 mg via SUBCUTANEOUS
  Filled 2021-10-15: qty 0.4

## 2021-10-15 MED ORDER — GLIMEPIRIDE 4 MG PO TABS
4.0000 mg | ORAL_TABLET | Freq: Every day | ORAL | Status: DC
  Administered 2021-10-16: 4 mg via ORAL
  Filled 2021-10-15: qty 1

## 2021-10-15 MED ORDER — NITROGLYCERIN 2 % TD OINT
1.0000 [in_us] | TOPICAL_OINTMENT | Freq: Four times a day (QID) | TRANSDERMAL | Status: DC
  Administered 2021-10-15 – 2021-10-16 (×3): 1 [in_us] via TOPICAL
  Filled 2021-10-15: qty 30

## 2021-10-15 MED ORDER — ROSUVASTATIN CALCIUM 20 MG PO TABS
20.0000 mg | ORAL_TABLET | Freq: Every morning | ORAL | Status: DC
  Administered 2021-10-15 – 2021-10-16 (×2): 20 mg via ORAL
  Filled 2021-10-15 (×2): qty 1

## 2021-10-15 MED ORDER — INSULIN ASPART 100 UNIT/ML IJ SOLN
10.0000 [IU] | Freq: Once | INTRAMUSCULAR | Status: AC
  Administered 2021-10-15: 10 [IU] via SUBCUTANEOUS
  Filled 2021-10-15: qty 0.1

## 2021-10-15 MED ORDER — CARVEDILOL 3.125 MG PO TABS
3.1250 mg | ORAL_TABLET | Freq: Two times a day (BID) | ORAL | Status: DC
  Administered 2021-10-15 – 2021-10-16 (×2): 3.125 mg via ORAL
  Filled 2021-10-15 (×2): qty 1

## 2021-10-15 MED ORDER — REPAGLINIDE 1 MG PO TABS
1.0000 mg | ORAL_TABLET | Freq: Two times a day (BID) | ORAL | Status: DC
  Administered 2021-10-15 – 2021-10-16 (×2): 1 mg via ORAL
  Filled 2021-10-15 (×3): qty 1

## 2021-10-15 MED ORDER — SODIUM CHLORIDE 0.9% FLUSH
3.0000 mL | Freq: Two times a day (BID) | INTRAVENOUS | Status: DC
  Administered 2021-10-15 – 2021-10-16 (×2): 3 mL via INTRAVENOUS

## 2021-10-15 MED ORDER — HYDRALAZINE HCL 20 MG/ML IJ SOLN
10.0000 mg | Freq: Once | INTRAMUSCULAR | Status: AC
  Administered 2021-10-15: 10 mg via INTRAVENOUS
  Filled 2021-10-15: qty 1

## 2021-10-15 MED ORDER — ALBUTEROL SULFATE (2.5 MG/3ML) 0.083% IN NEBU: 2.5000 mg | INHALATION_SOLUTION | Freq: Four times a day (QID) | RESPIRATORY_TRACT | Status: DC | PRN

## 2021-10-15 MED ORDER — SODIUM CHLORIDE 0.9 % IV SOLN: 250.0000 mL | INTRAVENOUS | Status: DC | PRN

## 2021-10-15 MED ORDER — ACETAMINOPHEN 500 MG PO TABS
1000.0000 mg | ORAL_TABLET | Freq: Once | ORAL | Status: AC
  Administered 2021-10-15: 1000 mg via ORAL
  Filled 2021-10-15: qty 2

## 2021-10-15 MED ORDER — SODIUM CHLORIDE 0.9% FLUSH: 3.0000 mL | INTRAVENOUS | Status: DC | PRN

## 2021-10-15 MED ORDER — METOPROLOL TARTRATE 25 MG PO TABS
50.0000 mg | ORAL_TABLET | Freq: Once | ORAL | Status: AC
  Administered 2021-10-15: 50 mg via ORAL
  Filled 2021-10-15: qty 2

## 2021-10-15 MED ORDER — ACETAMINOPHEN 325 MG PO TABS: 650.0000 mg | ORAL_TABLET | ORAL | Status: DC | PRN

## 2021-10-15 MED ORDER — FENOFIBRATE 54 MG PO TABS
54.0000 mg | ORAL_TABLET | Freq: Every morning | ORAL | Status: DC
  Administered 2021-10-15 – 2021-10-16 (×2): 54 mg via ORAL
  Filled 2021-10-15 (×2): qty 1

## 2021-10-15 MED ORDER — FUROSEMIDE 10 MG/ML IJ SOLN
20.0000 mg | Freq: Once | INTRAMUSCULAR | Status: AC
  Administered 2021-10-15: 20 mg via INTRAVENOUS
  Filled 2021-10-15: qty 4

## 2021-10-15 MED ORDER — HYDROCHLOROTHIAZIDE 12.5 MG PO TABS
25.0000 mg | ORAL_TABLET | Freq: Every day | ORAL | Status: DC
  Administered 2021-10-16: 25 mg via ORAL
  Filled 2021-10-15: qty 2

## 2021-10-15 MED ORDER — INSULIN ASPART 100 UNIT/ML IJ SOLN
0.0000 [IU] | Freq: Every day | INTRAMUSCULAR | Status: DC
  Administered 2021-10-15: 2 [IU] via SUBCUTANEOUS
  Filled 2021-10-15: qty 0.05

## 2021-10-15 MED ORDER — ACETAMINOPHEN 500 MG PO TABS: 1000.0000 mg | ORAL_TABLET | Freq: Four times a day (QID) | ORAL | Status: DC | PRN

## 2021-10-15 NOTE — ED Provider Notes (Signed)
[facility] DEPT Provider Note   CSN: [id number] Arrival date & time: 10/15/21  0028     History  Chief Complaint  Patient presents with   Chest Pain    Amanda Butler is a 66 y.o. female.  HPI Patient reports her blood pressures have been going quite high for the past several days.  She reports she is compliant with her medications.  She reports she has been on the same medication for quite a while and never had problems with significant blood pressure spikes.  She reports day before yesterday her blood pressure got as high as 829 systolic.  She reports that she has been having problems with shortness of breath with exertion, palpitations with exertion and near syncope.  Patient reports that she was attributing it to heat in her home but after she got discharged yesterday and went home she got chest pressure and shortness of breath at night.  She reports the pressure is a heaviness in the center of her chest.  She reports she did have a brief period where she thinks she lost consciousness.  She denies a fall or injury associated with this.  She has chronic lower extremity swelling.  She does not note the swelling is much different from usual.  Patient denies any tobacco or alcohol use.  No history of tobacco use.  No history of drug abuse.  Patient lives home independently alone.    Home Medications Prior to Admission medications   Medication Sig Start Date End Date Taking? Authorizing Provider  acetaminophen (TYLENOL) 500 MG tablet Take 1,000 mg by mouth 2 (two) times daily as needed (pain).   Yes [provider]  albuterol (PROVENTIL) (2.5 MG/3ML) 0.083% nebulizer solution Take 2.5 mg by nebulization See admin instructions. Take 2.5 mg by mouth 1-2 times a day as needed for wheezing and SOB   Yes [provider]  albuterol (VENTOLIN HFA) 108 (90 Base) MCG/ACT inhaler Inhale 2 puffs into the lungs 2 (two) times daily as needed for shortness  of breath or wheezing. 01/14/21  Yes [provider]  fenofibrate 54 MG tablet Take 1 tablet (54 mg total) by mouth daily. Patient taking differently: Take 54 mg by mouth every morning. 01/24/21 10/15/21 Yes Mariel Aloe, MD  glimepiride (AMARYL) 4 MG tablet Take 1 tablet (4 mg total) by mouth every morning. 11/19/18 02/10/22 Yes Black, Lezlie Octave, NP  lisinopril (ZESTRIL) 10 MG tablet Take 10 mg by mouth daily. 08/06/21  Yes [provider]  metoprolol succinate (TOPROL-XL) 50 MG 24 hr tablet Take 50 mg by mouth every morning. Take with or immediately following a meal.   Yes [provider]  Mupirocin (BACTROBAN EX) Apply 1 Application topically daily.   Yes [provider]  Oxymetazoline HCl (NASAL SPRAY NA) Place 2 sprays into the nose 2 (two) times daily as needed (asthma, runny nose).   Yes [provider]  repaglinide (PRANDIN) 1 MG tablet Take 1 mg by mouth daily. 08/19/18  Yes [provider]  rosuvastatin (CRESTOR) 20 MG tablet Take 20 mg by mouth every morning.   Yes [provider]  triamterene-hydrochlorothiazide (MAXZIDE) 75-50 MG per tablet Take 1 tablet by mouth every morning.   Yes [provider]  cefdinir (OMNICEF) 300 MG capsule Take 1 capsule (300 mg total) by mouth 2 (two) times daily. Patient not taking: Reported on 10/15/2021 05/09/21   Georgette Shell, MD  isosorbide mononitrate (IMDUR) 30 MG 24 hr  tablet Take 1 tablet (30 mg total) by mouth daily. Patient not taking: Reported on 10/15/2021 01/24/21 05/07/21  Mariel Aloe, MD  Skin Protectants, Misc. (EUCERIN) cream Apply 1 application topically daily as needed for dry skin. Patient not taking: Reported on 10/15/2021    [provider]      Allergies    Bee venom, Iodine, Metformin and related, Other, Shellfish allergy, Actos [pioglitazone], Aspirin, Celecoxib, Ibuprofen, Latex, Morphine and related, Nsaids, [place], and Ciprofloxacin    Review of  Systems   Review of Systems 10 systems reviewed negative except as per HPI Physical Exam Updated Vital Signs BP 130/60   Pulse 64   Temp 98.4 F (36.9 C) (Oral)   Resp 16   Ht '5\' 6"'$  (1.676 m)   Wt 95.3 kg   SpO2 96%   BMI 33.89 kg/m  Physical Exam Constitutional:      Comments: Alert nontoxic.  Mental Status clear.  HENT:     Mouth/Throat:     Pharynx: Oropharynx is clear.  Eyes:     Extraocular Movements: Extraocular movements intact.  Cardiovascular:     Rate and Rhythm: Normal rate and regular rhythm.  Pulmonary:     Effort: Pulmonary effort is normal.     Breath sounds: Normal breath sounds.  Abdominal:     General: There is no distension.     Palpations: Abdomen is soft.     Tenderness: There is no abdominal tenderness. There is no guarding.  Musculoskeletal:     Comments: Very large bilateral edema with skin thickening.  Neurological:     General: No focal deficit present.     Mental Status: She is oriented to person, place, and time.     Coordination: Coordination normal.  Psychiatric:        Mood and Affect: Mood normal.      ED Results / Procedures / Treatments   Labs (all labs ordered are listed, but only abnormal results are displayed) Labs Reviewed  BASIC METABOLIC PANEL - Abnormal; Notable for the following components:      Result Value   Glucose, Bld 359 (*)    All other components within normal limits  CBC - Abnormal; Notable for the following components:   RBC 5.16 (*)    All other components within normal limits  HEPATIC FUNCTION PANEL - Abnormal; Notable for the following components:   AST 13 (*)    Total Bilirubin 1.7 (*)    Bilirubin, Direct 0.3 (*)    Indirect Bilirubin 1.4 (*)    All other components within normal limits  LIPID PANEL - Abnormal; Notable for the following components:   HDL 40 (*)    LDL Cholesterol 123 (*)    All other components within normal limits  CBG MONITORING, ED - Abnormal; Notable for the following  components:   Glucose-Capillary 278 (*)    All other components within normal limits  CBG MONITORING, ED - Abnormal; Notable for the following components:   Glucose-Capillary 339 (*)    All other components within normal limits  BRAIN NATRIURETIC PEPTIDE  TROPONIN I (HIGH SENSITIVITY)  TROPONIN I (HIGH SENSITIVITY)    EKG EKG Interpretation  Date/Time:  Tuesday October 15 2021 00:44:22 EDT Ventricular Rate:  84 PR Interval:  162 QRS Duration: 130 QT Interval:  402 QTC Calculation: 476 R Axis:   4 Text Interpretation: Sinus rhythm Right bundle branch block Left ventricular hypertrophy No significant change since last tracing Confirmed by Calvert Cantor [phone])  on 10/15/2021 3:18:24 AM  Radiology DG Chest 2 View  Result Date: 10/15/2021 CLINICAL DATA:  Chest pain, shortness of breath. EXAM: CHEST - 2 VIEW COMPARISON:  10/13/2021 FINDINGS: The heart is borderline enlarged and mediastinal contours are within normal limits. Both lungs are clear. Degenerative changes are present in the thoracic spine. No acute osseous abnormality. IMPRESSION: No active cardiopulmonary disease. Electronically Signed   By: Brett Fairy M.D.   On: 10/15/2021 01:49    Procedures Procedures    Medications Ordered in ED Medications  nitroGLYCERIN (NITROGLYN) 2 % ointment 1 inch (has no administration in time range)  carvedilol (COREG) tablet 3.125 mg (has no administration in time range)  lisinopril (ZESTRIL) tablet 40 mg (has no administration in time range)  hydrochlorothiazide (HYDRODIURIL) tablet 25 mg (has no administration in time range)  fenofibrate tablet 54 mg (has no administration in time range)  rosuvastatin (CRESTOR) tablet 20 mg (has no administration in time range)  glimepiride (AMARYL) tablet 4 mg (has no administration in time range)  repaglinide (PRANDIN) tablet 1 mg (has no administration in time range)  albuterol (VENTOLIN HFA) 108 (90 Base) MCG/ACT inhaler 2 puff (has no administration  in time range)  sodium chloride flush (NS) 0.9 % injection 3 mL (has no administration in time range)  sodium chloride flush (NS) 0.9 % injection 3 mL (has no administration in time range)  0.9 %  sodium chloride infusion (has no administration in time range)  acetaminophen (TYLENOL) tablet 650 mg (has no administration in time range)  enoxaparin (LOVENOX) injection 40 mg (has no administration in time range)  insulin aspart (novoLOG) injection 0-20 Units (has no administration in time range)  insulin aspart (novoLOG) injection 0-5 Units (has no administration in time range)  metoprolol tartrate (LOPRESSOR) tablet 50 mg (50 mg Oral Given 10/15/21 0744)  acetaminophen (TYLENOL) tablet 1,000 mg (1,000 mg Oral Given 10/15/21 1039)  insulin aspart (novoLOG) injection 10 Units (10 Units Subcutaneous Given 10/15/21 1042)  furosemide (LASIX) injection 20 mg (20 mg Intravenous Given 10/15/21 1038)  hydrALAZINE (APRESOLINE) injection 10 mg (10 mg Intravenous Given 10/15/21 1039)    ED Course/ Medical Decision Making/ A&P                           Medical Decision Making Amount and/or Complexity of Data Reviewed Labs: ordered. Radiology: ordered.  Risk OTC drugs. Prescription drug management. Decision regarding hospitalization.   Patient presents with recurrent shortness of breath, palpitations exertion and near syncope.  She is also experiencing chest pressure which seems to be occurring more at night.  Patient describes the symptoms as new or worsening over the past 2 days.  She was attributing it to heat in her home but at this point is concerned with persisting chest pain shortness of breath and near syncope.  Differential diagnosis includes ACS\PE\pneumonia\CHF.  She was given home dose of metoprolol.  We will add glycerin paste and assess for CHF.  Patient may need diuresis.  History of grade 2 diastolic dysfunction possible worsening with several days or more of significant hypertension.  Patient is  hyperglycemic will administer subcu insulin and monitor blood sugars.  EKG without acute changes.  No evidence of STEMI at this time.  Troponins are flat.  Lower suspicion for ACS however with risk factors and significant hypertension plan for admission observation.  Patient is significantly hypertensive.  She was given home dose of Toprol and nitroglycerin plaand hydralazine.  At  this time will plan for admission for acute on chronic CHF suspected with hypertensive urgency.  Patient's blood pressures are significantly elevated and symptoms of shortness of breath, lightheadedness and chest pain have escalated over the past 24 hours.  Consult: Reviewed with Dr. Roosevelt L[facility] hospitalist for admission.        Final Clinical Impression(s) / ED Diagnoses Final diagnoses:  Nonspecific chest pain  Hypertensive urgency  Dyspnea, unspecified type  Episodic lightheadedness    Rx / DC Orders ED Discharge Orders     None         Charlesetta Shanks, MD 10/15/21 1636

## 2021-10-15 NOTE — ED Triage Notes (Signed)
Pt reports with chest pain since last night. Pt states that it is 86 degrees in her house and she thinks that she just got hot.

## 2021-10-15 NOTE — H&P (Addendum)
History and Physical    Amanda Butler PVV:[id number] DOB: 05/26/1955 DOA: 10/15/2021  PCP: Gaynelle Arabian, MD (Confirm with patient/family/NH records and if not entered, this has to be entered at [facility] point of entry) Patient coming from: Home  I have personally briefly reviewed patient's old medical records in [place]  Chief Complaint: BP too high, chest pain  HPI: Amanda Butler is a 66 y.o. female with medical history significant of IIDM, HTN, venous stasis, morbid obesity, presented with multiple complaints including uncontrolled hypertension, lightheaded, chest pain.  Patient supposed to take 3 blood pressure medications including Maxide, metoprolol and Imdur.  She reported that she ran out of the Imdur sometimes ago.  And started from 5 days ago, her blood pressure became uncontrolled with SBP almost always more than 200.  She has been compliant with metoprolol and Maxide.  Over the weekend, she developed episodes of chest tightness shortness of breath and lightheadedness.  She came to ED, was found her blood pressure was uncontrolled and she was given multiple BP meds and sent home.  Overnight, her symptoms of chest tightness, shortness of breath returned.  ED Course: SBP more than 200.  Afebrile, no hypoxia, chest x-ray showed no significant infiltrates.  Physical exam showed signs of bilateral lower extremity edema.  Review of Systems: As per HPI otherwise 14 point review of systems negative.    Past Medical History:  Diagnosis Date   Arthritis    "knees" (09/13/2015)   Asthma    Cellulitis 11/17/2018   LEFT LEG    Chronic diastolic CHF (congestive heart failure) ([place])    a. 09/2015 Echo: EF 55-60%, mild LVH, gr2DD, no rwma, mild MR, mildly dil LA.    GERD (gastroesophageal reflux disease)    High cholesterol    Hypertension    Midsternal chest pain    a. 09/2015 MV: no ischemia/infarct, EF 61%; b. 09/2015 Low prob V:Q scan.   Morbid obesity ([place])    Pneumonia ~ 2000    "mild case"   RBBB (right bundle branch block) approx 2010   Dr Marisue Humble did ECG and told her   Type II diabetes mellitus ([place])     Past Surgical History:  Procedure Laterality Date   BUNIONECTOMY Left    COLONOSCOPY WITH PROPOFOL N/A 09/23/2016   Procedure: COLONOSCOPY WITH PROPOFOL;  Surgeon: Garlan Fair, MD;  Location: [facility];  Service: Endoscopy;  Laterality: N/A;   MULTIPLE TOOTH EXTRACTIONS  ~ 1975   TENDON RELEASE Right    Leg   TUBAL LIGATION  ~ 1983     reports that she has never smoked. She has never used smokeless tobacco. She reports that she does not drink alcohol and does not use drugs.  Allergies  Allergen Reactions   Bee Venom Anaphylaxis   Iodine Shortness Of Breath and Rash   Other Shortness Of Breath    Reaction to Xray dye   Shellfish Allergy Anaphylaxis   Actos [Pioglitazone] Other (See Comments)    Erratic heartbeat   Aspirin Hives   Celecoxib Other (See Comments)    Unknown reaction   Latex Hives   Metformin And Related Other (See Comments)    Patient reports flu-like symptoms and fatigue   Morphine And Related Other (See Comments)    Childhood allergy (66 years old) , pt got sicker , temp went up, dr gave her the wrong medication   Nsaids Hives   Ciprofloxacin Palpitations    Joint pain  Family History  Problem Relation Age of Onset   Diabetes Mother    Hypertension Mother    Diabetes Father    Hypertension Father    Heart attack Father 17   Diabetes Maternal Grandmother    Hypertension Maternal Grandmother    Cancer Maternal Grandfather    Diabetes Maternal Grandfather    Hypertension Maternal Grandfather    Asthma Paternal Grandmother    Diabetes Paternal Grandmother    Hypertension Paternal Grandmother    Diabetes Paternal Grandfather    Hypertension Paternal Grandfather    Heart failure Paternal Grandfather    Diabetes Maternal Aunt      Prior to Admission medications   Medication Sig Start Date End Date Taking?  Authorizing Provider  acetaminophen (TYLENOL) 500 MG tablet Take 1,000 mg by mouth every 6 (six) hours as needed for headache or fever (pain).    [provider]  albuterol (PROVENTIL) (2.5 MG/3ML) 0.083% nebulizer solution Take 2.5 mg by nebulization every 6 (six) hours as needed for wheezing or shortness of breath. Patient not taking: Reported on 05/07/2021    [provider]  albuterol (VENTOLIN HFA) 108 (90 Base) MCG/ACT inhaler Inhale 2 puffs into the lungs every 6 (six) hours as needed for shortness of breath or wheezing. 01/14/21   [provider]  cefdinir (OMNICEF) 300 MG capsule Take 1 capsule (300 mg total) by mouth 2 (two) times daily. 05/09/21   Georgette Shell, MD  fenofibrate 54 MG tablet Take 1 tablet (54 mg total) by mouth daily. Patient taking differently: Take 54 mg by mouth every morning. 01/24/21 05/07/21  Mariel Aloe, MD  glimepiride (AMARYL) 4 MG tablet Take 1 tablet (4 mg total) by mouth every morning. 11/19/18 02/10/22  Radene Gunning, NP  isosorbide mononitrate (IMDUR) 30 MG 24 hr tablet Take 1 tablet (30 mg total) by mouth daily. Patient taking differently: Take 30 mg by mouth every morning. 01/24/21 05/07/21  Mariel Aloe, MD  metoprolol succinate (TOPROL-XL) 50 MG 24 hr tablet Take 50 mg by mouth every morning. Take with or immediately following a meal.    [provider]  repaglinide (PRANDIN) 1 MG tablet Take 1 mg by mouth 2 (two) times daily before a meal. 08/19/18   [provider]  rosuvastatin (CRESTOR) 20 MG tablet Take 20 mg by mouth every morning.    [provider]  Skin Protectants, Misc. (EUCERIN) cream Apply 1 application topically daily as needed for dry skin.    [provider]  triamterene-hydrochlorothiazide (MAXZIDE) 75-50 MG per tablet Take 1 tablet by mouth every morning.    [provider]    Physical Exam: Vitals:   10/15/21 0359 10/15/21 0753 10/15/21 0900 10/15/21 0930   BP: (!) 218/81 (!) 181/145 (!) 204/93 (!) 192/85  Pulse: 63 78 67 64  Resp: '18 16 18 18  '$ Temp:      TempSrc:      SpO2: 97% 100% 100% 100%  Weight:      Height:        Constitutional: NAD, calm, comfortable Vitals:   10/15/21 0359 10/15/21 0753 10/15/21 0900 10/15/21 0930  BP: (!) 218/81 (!) 181/145 (!) 204/93 (!) 192/85  Pulse: 63 78 67 64  Resp: '18 16 18 18  '$ Temp:      TempSrc:      SpO2: 97% 100% 100% 100%  Weight:      Height:       Eyes: PERRL, lids and conjunctivae normal  ENMT: Mucous membranes are moist. Posterior pharynx clear of any exudate or lesions.Normal dentition.  Neck: normal, supple, no masses, no thyromegaly Respiratory: clear to auscultation bilaterally, no wheezing, no crackles. Normal respiratory effort. No accessory muscle use.  Cardiovascular: Regular rate and rhythm, no murmurs / rubs / gallops.  Chronic venous stasis changes on the skin, with few shallow ulcers, and chronic lymphedema-like changes, trace extremity edema. 2+ pedal pulses. No carotid bruits.  Abdomen: no tenderness, no masses palpated. No hepatosplenomegaly. Bowel sounds positive.  Musculoskeletal: no clubbing / cyanosis. No joint deformity upper and lower extremities. Good ROM, no contractures. Normal muscle tone.  Skin: no rashes, lesions, ulcers. No induration Neurologic: CN 2-12 grossly intact. Sensation intact, DTR normal. Strength 5/5 in all 4.  Psychiatric: Normal judgment and insight. Alert and oriented x 3. Normal mood.     Labs on Admission: I have personally reviewed following labs and imaging studies  CBC: Recent Labs  Lab 10/13/21 1553 10/15/21 0059  WBC 6.2 5.9  NEUTROABS 4.9  --   HGB 14.9 14.4  HCT 44.5 43.8  MCV 86.2 84.9  PLT 214 016   Basic Metabolic Panel: Recent Labs  Lab 10/13/21 1553 10/15/21 0059  NA 137 138  K 3.6 3.6  CL 104 105  CO2 24 24  GLUCOSE 324* 359*  BUN 11 14  CREATININE 0.94 0.89  CALCIUM 9.1 8.9   GFR: Estimated Creatinine  Clearance: 70.4 mL/min (by C-G formula based on SCr of 0.89 mg/dL). Liver Function Tests: Recent Labs  Lab 10/13/21 1553  AST 17  ALT 11  ALKPHOS 108  BILITOT 1.3*  PROT 7.8  ALBUMIN 3.5   No results for input(s): "LIPASE", "AMYLASE" in the last 168 hours. No results for input(s): "AMMONIA" in the last 168 hours. Coagulation Profile: No results for input(s): "INR", "PROTIME" in the last 168 hours. Cardiac Enzymes: No results for input(s): "CKTOTAL", "CKMB", "CKMBINDEX", "TROPONINI" in the last 168 hours. BNP (last 3 results) No results for input(s): "PROBNP" in the last 8760 hours. HbA1C: No results for input(s): "HGBA1C" in the last 72 hours. CBG: Recent Labs  Lab 10/15/21 0913  GLUCAP 278*   Lipid Profile: No results for input(s): "CHOL", "HDL", "LDLCALC", "TRIG", "CHOLHDL", "LDLDIRECT" in the last 72 hours. Thyroid Function Tests: No results for input(s): "TSH", "T4TOTAL", "FREET4", "T3FREE", "THYROIDAB" in the last 72 hours. Anemia Panel: No results for input(s): "VITAMINB12", "FOLATE", "FERRITIN", "TIBC", "IRON", "RETICCTPCT" in the last 72 hours. Urine analysis:    Component Value Date/Time   COLORURINE YELLOW 05/06/2021 1803   APPEARANCEUR HAZY (A) 05/06/2021 1803   LABSPEC 1.031 (H) 05/06/2021 1803   PHURINE 8.0 05/06/2021 1803   GLUCOSEU >=500 (A) 05/06/2021 1803   HGBUR NEGATIVE 05/06/2021 1803   BILIRUBINUR NEGATIVE 05/06/2021 1803   KETONESUR 5 (A) 05/06/2021 1803   PROTEINUR 100 (A) 05/06/2021 1803   NITRITE POSITIVE (A) 05/06/2021 1803   LEUKOCYTESUR SMALL (A) 05/06/2021 1803    Radiological Exams on Admission: DG Chest 2 View  Result Date: 10/15/2021 CLINICAL DATA:  Chest pain, shortness of breath. EXAM: CHEST - 2 VIEW COMPARISON:  10/13/2021 FINDINGS: The heart is borderline enlarged and mediastinal contours are within normal limits. Both lungs are clear. Degenerative changes are present in the thoracic spine. No acute osseous abnormality.  IMPRESSION: No active cardiopulmonary disease. Electronically Signed   By: Brett Fairy M.D.   On: 10/15/2021 01:49   DG Chest 2 View  Result Date: 10/13/2021 CLINICAL DATA:  Shortness of breath,  heat exhaustion, history CHF, LEFT lower leg draining blood and fluid for 2 days EXAM: CHEST - 2 VIEW COMPARISON:  05/08/2021 FINDINGS: Upper normal heart size. Mediastinal contours and pulmonary vascularity normal. Lungs clear. No pulmonary infiltrate, pleural effusion, or pneumothorax. Degenerative disc disease changes thoracic spine. IMPRESSION: No acute abnormalities. Electronically Signed   By: Lavonia Dana M.D.   On: 10/13/2021 16:04    EKG: Independently reviewed.  Chronic RBBB, no acute ST changes.  Assessment/Plan Principal Problem:   CHF (congestive heart failure) (HCC) Active Problems:   Malignant HTN with heart disease, w/o CHF, w/o chronic kidney disease  (please populate well all problems here in Problem List. (For example, if patient is on BP meds at home and you resume or decide to hold them, it is a problem that needs to be her. Same for CAD, COPD, HLD and so on)  HTN emergency -Likely secondary to noncoherent with BP regimen -We will make changes to her BP med regimen, she used to be on lisinopril without problem, will restart lisinopril given her baseline diabetes, start lisinopril 40 mg daily, continue HCTZ 25 daily, change metoprolol to Coreg 3.125 mg twice daily.  Chest pain -ACS ruled out -Likely secondary to uncontrolled hypertension.  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  Recommend follow-up with cardiology for outpatient stress test -Plavix for secondary prevention if her insurance approves.  Briefly discussed with patient regarding this issue.  IIDM with hyperglycemia -Patient refused insulin claiming that she has chronic weakness of her forearm and hands, unable to handle insulin  injection including insulin pen -Has severe allergy to metformin, and insurance not covering [place]. -Continue current glimepiride and Prandin regimen  HLD -Continue fenofibrate  Morbid obesity -Calorie control recommended -Outpatient sleep study to rule out OSA  DVT prophylaxis: Lovenox Code Status: Full code Family Communication: None at bedside Disposition Plan: Expect less than 2 midnight hospital stay Consults called: None Admission status: MedSurg observation   Lequita Halt MD [facility] Pager [phone]  10/15/2021, 10:46 AM

## 2021-10-15 NOTE — ED Notes (Signed)
Pt ambulated with walker to bathroom.

## 2021-10-15 NOTE — Consult Note (Signed)
WOC Nurse Consult Note: Patient receiving care in [facility] ED 10. Consult completed remotely after review of record and images. Reason for Consult: leg wound Wound type: likely related to fluid overload. Legs are edematous Pressure Injury POA: Yes/No/NA Measurement: Wound bed: LLE wound bed is pink Drainage (amount, consistency, odor) to be determined Periwound: scaling skin, edema, some erythema on RLE pretibial area Dressing procedure/placement/frequency: Wash legs with soap and water. Pat dry. Apply Sween Moisturizing Ointment. Place Xeroform gauzes Kellie Simmering [phone]) over any open or weeping areas. Beginning behind the toes and going to just below the knees, spiral wrap kerlix, then 4 inch ace wrap. Perform daily.  [facility] nurse will not follow at this time.  Please re-consult the [facility] team if needed.  Val Riles, RN, MSN, CWOCN, CNS-BC, pager [phone]

## 2021-10-16 ENCOUNTER — Other Ambulatory Visit (HOSPITAL_COMMUNITY): Payer: Self-pay

## 2021-10-16 DIAGNOSIS — E119 Type 2 diabetes mellitus without complications: Secondary | ICD-10-CM | POA: Diagnosis not present

## 2021-10-16 DIAGNOSIS — R600 Localized edema: Secondary | ICD-10-CM

## 2021-10-16 DIAGNOSIS — I5032 Chronic diastolic (congestive) heart failure: Secondary | ICD-10-CM

## 2021-10-16 DIAGNOSIS — I10 Essential (primary) hypertension: Secondary | ICD-10-CM

## 2021-10-16 DIAGNOSIS — I878 Other specified disorders of veins: Secondary | ICD-10-CM | POA: Diagnosis not present

## 2021-10-16 DIAGNOSIS — Z91199 Patient's noncompliance with other medical treatment and regimen due to unspecified reason: Secondary | ICD-10-CM | POA: Diagnosis not present

## 2021-10-16 DIAGNOSIS — R079 Chest pain, unspecified: Secondary | ICD-10-CM | POA: Diagnosis not present

## 2021-10-16 LAB — BASIC METABOLIC PANEL
Anion gap: 8 (ref 5–15)
BUN: 16 mg/dL (ref 8–23)
CO2: 27 mmol/L (ref 22–32)
Calcium: 8.7 mg/dL — ABNORMAL LOW (ref 8.9–10.3)
Chloride: 104 mmol/L (ref 98–111)
Creatinine, Ser: 0.77 mg/dL (ref 0.44–1.00)
GFR, Estimated: >60 mL/min (ref >60)
Glucose, Bld: 179 mg/dL — ABNORMAL HIGH (ref 70–99)
Potassium: 3.5 mmol/L (ref 3.5–5.1)
Sodium: 139 mmol/L (ref 135–145)

## 2021-10-16 LAB — GLUCOSE, CAPILLARY
Glucose-Capillary: 211 mg/dL — ABNORMAL HIGH (ref 70–99)
Glucose-Capillary: 237 mg/dL — ABNORMAL HIGH (ref 70–99)

## 2021-10-16 MED ORDER — CARVEDILOL 6.25 MG PO TABS
6.2500 mg | ORAL_TABLET | Freq: Two times a day (BID) | ORAL | 1 refills | Status: DC
  Filled 2021-10-16: qty 180, 90d supply, fill #0

## 2021-10-16 MED ORDER — FUROSEMIDE 40 MG PO TABS
40.0000 mg | ORAL_TABLET | Freq: Every day | ORAL | 2 refills | Status: DC
  Filled 2021-10-16: qty 30, 30d supply, fill #0

## 2021-10-16 NOTE — Progress Notes (Signed)
Dressing change performed to Bilateral LE's per MD order. Patient tolerated well.

## 2021-10-16 NOTE — Progress Notes (Signed)
Provided discharge education/instruction, all questions and concerns addressed. Pt not in acute distress, discharged home with belongings.

## 2021-10-16 NOTE — Plan of Care (Signed)
  Problem: Education: Goal: Ability to describe self-care measures that may prevent or decrease complications (Diabetes Survival Skills Education) will improve Outcome: Progressing   Problem: Coping: Goal: Ability to adjust to condition or change in health will improve Outcome: Progressing   Problem: Fluid Volume: Goal: Ability to maintain a balanced intake and output will improve Outcome: Progressing   Problem: Metabolic: Goal: Ability to maintain appropriate glucose levels will improve Outcome: Progressing   Problem: Nutritional: Goal: Maintenance of adequate nutrition will improve Outcome: Progressing   Problem: Skin Integrity: Goal: Risk for impaired skin integrity will decrease Outcome: Progressing

## 2021-10-16 NOTE — TOC Transition Note (Signed)
Transition of Care [facility]) - CM/SW Discharge Note   Patient Details  Name: Amanda Butler MRN: [id number] Date of Birth: 1955/04/20  [facility] CM/SW Contact:  Lennart Pall, LCSW Phone Number: 10/16/2021, 11:40 AM   Clinical Narrative:     Orders placed for [facility] follow up, however, pt adamantly refusing any HH services.  No further TOC needs.        Patient Goals and CMS Choice        Discharge Placement                       Discharge Plan and Services                                     Social Determinants of Health (SDOH) Interventions     Readmission Risk Interventions     No data to display
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
## 2021-11-12 DIAGNOSIS — R942 Abnormal results of pulmonary function studies: Secondary | ICD-10-CM | POA: Diagnosis not present

## 2021-11-12 DIAGNOSIS — J439 Emphysema, unspecified: Secondary | ICD-10-CM | POA: Diagnosis not present

## 2021-11-19 DIAGNOSIS — R809 Proteinuria, unspecified: Secondary | ICD-10-CM | POA: Diagnosis not present

## 2021-11-19 DIAGNOSIS — Z1239 Encounter for other screening for malignant neoplasm of breast: Secondary | ICD-10-CM | POA: Diagnosis not present

## 2021-11-19 DIAGNOSIS — E1129 Type 2 diabetes mellitus with other diabetic kidney complication: Secondary | ICD-10-CM | POA: Diagnosis not present

## 2021-11-19 DIAGNOSIS — Z86010 Personal history of colonic polyps: Secondary | ICD-10-CM | POA: Diagnosis not present

## 2021-11-19 DIAGNOSIS — K219 Gastro-esophageal reflux disease without esophagitis: Secondary | ICD-10-CM | POA: Diagnosis not present

## 2021-11-19 DIAGNOSIS — I10 Essential (primary) hypertension: Secondary | ICD-10-CM | POA: Diagnosis not present

## 2021-11-19 DIAGNOSIS — J309 Allergic rhinitis, unspecified: Secondary | ICD-10-CM | POA: Diagnosis not present

## 2021-11-19 DIAGNOSIS — J45909 Unspecified asthma, uncomplicated: Secondary | ICD-10-CM | POA: Diagnosis not present

## 2021-11-19 DIAGNOSIS — E7800 Pure hypercholesterolemia, unspecified: Secondary | ICD-10-CM | POA: Diagnosis not present

## 2021-11-19 DIAGNOSIS — Z0000 Encounter for general adult medical examination without abnormal findings: Secondary | ICD-10-CM | POA: Diagnosis not present

## 2021-12-29 ENCOUNTER — Emergency Department (HOSPITAL_COMMUNITY): Payer: Medicare Other

## 2021-12-29 ENCOUNTER — Emergency Department (HOSPITAL_COMMUNITY)
Admission: EM | Admit: 2021-12-29 | Discharge: 2021-12-29 | Disposition: A | Discharge status: Home / Self Care | Payer: Medicare Other | Attending: Emergency Medicine | Admitting: Emergency Medicine

## 2021-12-29 ENCOUNTER — Encounter (HOSPITAL_COMMUNITY): Payer: Self-pay

## 2021-12-29 DIAGNOSIS — Y900 Blood alcohol level of less than 20 mg/100 ml: Secondary | ICD-10-CM | POA: Diagnosis not present

## 2021-12-29 DIAGNOSIS — R079 Chest pain, unspecified: Secondary | ICD-10-CM | POA: Diagnosis not present

## 2021-12-29 DIAGNOSIS — E119 Type 2 diabetes mellitus without complications: Secondary | ICD-10-CM | POA: Insufficient documentation

## 2021-12-29 DIAGNOSIS — Z79899 Other long term (current) drug therapy: Secondary | ICD-10-CM | POA: Insufficient documentation

## 2021-12-29 DIAGNOSIS — I509 Heart failure, unspecified: Secondary | ICD-10-CM | POA: Diagnosis not present

## 2021-12-29 DIAGNOSIS — R531 Weakness: Secondary | ICD-10-CM | POA: Insufficient documentation

## 2021-12-29 DIAGNOSIS — R0602 Shortness of breath: Secondary | ICD-10-CM | POA: Diagnosis not present

## 2021-12-29 DIAGNOSIS — R29818 Other symptoms and signs involving the nervous system: Secondary | ICD-10-CM | POA: Diagnosis not present

## 2021-12-29 DIAGNOSIS — H538 Other visual disturbances: Secondary | ICD-10-CM | POA: Diagnosis not present

## 2021-12-29 DIAGNOSIS — Z91040 Latex allergy status: Secondary | ICD-10-CM | POA: Insufficient documentation

## 2021-12-29 DIAGNOSIS — R29898 Other symptoms and signs involving the musculoskeletal system: Secondary | ICD-10-CM

## 2021-12-29 DIAGNOSIS — I10 Essential (primary) hypertension: Secondary | ICD-10-CM

## 2021-12-29 DIAGNOSIS — R42 Dizziness and giddiness: Secondary | ICD-10-CM | POA: Insufficient documentation

## 2021-12-29 DIAGNOSIS — J45909 Unspecified asthma, uncomplicated: Secondary | ICD-10-CM | POA: Diagnosis not present

## 2021-12-29 DIAGNOSIS — I110 Hypertensive heart disease with heart failure: Secondary | ICD-10-CM | POA: Diagnosis not present

## 2021-12-29 LAB — ETHANOL: Alcohol, Ethyl (B): <10 mg/dL (ref <10)

## 2021-12-29 LAB — COMPREHENSIVE METABOLIC PANEL
ALT: 15 U/L (ref 0–44)
AST: 15 U/L (ref 15–41)
Albumin: 4 g/dL (ref 3.5–5.0)
Alkaline Phosphatase: 112 U/L (ref 38–126)
Anion gap: 8 (ref 5–15)
BUN: 13 mg/dL (ref 8–23)
CO2: 24 mmol/L (ref 22–32)
Calcium: 9.1 mg/dL (ref 8.9–10.3)
Chloride: 106 mmol/L (ref 98–111)
Creatinine, Ser: 0.83 mg/dL (ref 0.44–1.00)
GFR, Estimated: >60 mL/min (ref >60)
Glucose, Bld: 278 mg/dL — ABNORMAL HIGH (ref 70–99)
Potassium: 3.3 mmol/L — ABNORMAL LOW (ref 3.5–5.1)
Sodium: 138 mmol/L (ref 135–145)
Total Bilirubin: 1.4 mg/dL — ABNORMAL HIGH (ref 0.3–1.2)
Total Protein: 7.8 g/dL (ref 6.5–8.1)

## 2021-12-29 LAB — TROPONIN I (HIGH SENSITIVITY): Troponin I (High Sensitivity): 14 ng/L (ref <18)

## 2021-12-29 LAB — RAPID URINE DRUG SCREEN, HOSP PERFORMED
Amphetamines: NOT DETECTED
Barbiturates: NOT DETECTED
Benzodiazepines: NOT DETECTED
Cocaine: NOT DETECTED
Opiates: NOT DETECTED
Tetrahydrocannabinol: NOT DETECTED

## 2021-12-29 LAB — CBC
HCT: 45.4 % (ref 36.0–46.0)
Hemoglobin: 15.5 g/dL — ABNORMAL HIGH (ref 12.0–15.0)
MCH: 28.8 pg (ref 26.0–34.0)
MCHC: 34.1 g/dL (ref 30.0–36.0)
MCV: 84.2 fL (ref 80.0–100.0)
Platelets: 220 10*3/uL (ref 150–400)
RBC: 5.39 MIL/uL — ABNORMAL HIGH (ref 3.87–5.11)
RDW: 14 % (ref 11.5–15.5)
WBC: 7 10*3/uL (ref 4.0–10.5)
nRBC: 0 % (ref 0.0–0.2)

## 2021-12-29 LAB — URINALYSIS, ROUTINE W REFLEX MICROSCOPIC
Bacteria, UA: NONE SEEN
Bilirubin Urine: NEGATIVE
Glucose, UA: >=500 mg/dL — AB
Hgb urine dipstick: NEGATIVE
Ketones, ur: NEGATIVE mg/dL
Leukocytes,Ua: NEGATIVE
Nitrite: NEGATIVE
Protein, ur: 100 mg/dL — AB
Specific Gravity, Urine: 1.011 (ref 1.005–1.030)
pH: 7 (ref 5.0–8.0)

## 2021-12-29 LAB — BRAIN NATRIURETIC PEPTIDE: B Natriuretic Peptide: 73 pg/mL (ref 0.0–100.0)

## 2021-12-29 LAB — APTT: aPTT: 25 seconds (ref 24–36)

## 2021-12-29 LAB — PROTIME-INR
INR: 1.1 (ref 0.8–1.2)
Prothrombin Time: 13.7 seconds (ref 11.4–15.2)

## 2021-12-29 LAB — CBG MONITORING, ED: Glucose-Capillary: 277 mg/dL — ABNORMAL HIGH (ref 70–99)

## 2021-12-29 MED ORDER — NITROGLYCERIN 2 % TD OINT
1.0000 [in_us] | TOPICAL_OINTMENT | Freq: Once | TRANSDERMAL | Status: AC
  Administered 2021-12-29: 1 [in_us] via TOPICAL
  Filled 2021-12-29: qty 1

## 2021-12-29 MED ORDER — CARVEDILOL 3.125 MG PO TABS: 6.2500 mg | ORAL_TABLET | Freq: Two times a day (BID) | ORAL | Status: DC

## 2021-12-29 MED ORDER — POTASSIUM CHLORIDE CRYS ER 20 MEQ PO TBCR
40.0000 meq | EXTENDED_RELEASE_TABLET | Freq: Once | ORAL | Status: AC
  Administered 2021-12-29: 40 meq via ORAL
  Filled 2021-12-29: qty 2

## 2021-12-29 MED ORDER — MAGNESIUM OXIDE -MG SUPPLEMENT 400 (240 MG) MG PO TABS
800.0000 mg | ORAL_TABLET | Freq: Once | ORAL | Status: AC
  Administered 2021-12-29: 800 mg via ORAL
  Filled 2021-12-29: qty 2

## 2021-12-29 MED ORDER — LACTATED RINGERS IV BOLUS: 500.0000 mL | Freq: Once | INTRAVENOUS | Status: DC

## 2021-12-29 MED ORDER — LISINOPRIL 10 MG PO TABS
10.0000 mg | ORAL_TABLET | Freq: Every day | ORAL | Status: DC
  Administered 2021-12-29: 10 mg via ORAL
  Filled 2021-12-29: qty 1

## 2021-12-29 NOTE — ED Triage Notes (Signed)
Pt states that approximately 30 min PTA she began having dizziness and blurry vision when visiting her daughter. Pt also c/o SOB and CP.  NIH 0, VAN neg during triage.

## 2021-12-29 NOTE — ED Provider Triage Note (Signed)
Emergency Medicine Provider Triage Evaluation Note  Amanda Butler , a 66 y.o. female  was evaluated in triage.  Pt complains of dizziness and blurred vision noted to bilateral eyes onset prior to arrival.  Notes that her blurred vision lasted approximately 30 minutes and has resolved at this time.  Has associated shortness of breath.  No meds tried prior to arrival.  Denies anticoagulant use at this time.  Denies chest pain.  Denies lightheadedness.  No new medications.  Denies LOC.  Felt presyncopal.  Review of Systems  Positive:  Negative:   Physical Exam  BP (!) 239/107 (BP Location: Left Arm)   Pulse (!) 108   Temp 97.9 F (36.6 C) (Oral)   Resp 20   SpO2 97%  Gen:   Awake, no distress   Resp:  Normal effort  MSK:   Moves extremities without difficulty  Other:  No focal neurological deficits noted on exam.  Negative pronator drift.  Medical Decision Making  Medically screening exam initiated at 5:33 PM.  Appropriate orders placed.  Amanda Butler was informed that the remainder of the evaluation will be completed by another provider, this initial triage assessment does not replace that evaluation, and the importance of remaining in the ED until their evaluation is complete.  Work-up initiated   Amanda Butler A, PA-C 12/29/21 1734

## 2021-12-29 NOTE — Discharge Instructions (Signed)
Follow-up with your primary care doctor soon as possible for review of your home medications.  You would likely benefit from increased blood pressure and blood sugar control through daily medications.  Return to the emergency department at any time for any new or worsening symptoms of concern.

## 2021-12-29 NOTE — ED Provider Notes (Signed)
[facility] DEPT Provider Note   CSN: [id number] Arrival date & time: 12/29/21  1712     History  Chief Complaint  Patient presents with   Dizziness    Amanda Butler is a 66 y.o. female.   Dizziness Associated symptoms: weakness (Bilateral legs)   Patient presents for dizziness, blurred vision, and bilateral leg weakness.  Medical history includes peripheral edema, DM 2, HTN, asthma, GERD, obesity, HLD, CHF, arthritis.  He was hospitalized for 2 months ago for accelerated hypertension.  At that time, Amanda Butler endorsed dizziness, chest pain, and shortness of breath.  Amanda Butler had SBP's in the 200s.  Home medications at time of prior discharge include Coreg, Lasix, lisinopril.  Amanda Butler reports adherence to these medications.  Amanda Butler takes her blood pressure medications in the morning.  Last doses were this morning.  This evening, Amanda Butler was visiting her daughter in the nursing home where her daughter resides.  Amanda Butler was there for several hours.  When Amanda Butler went to get up to leave, Amanda Butler felt weak in both of her legs.  Amanda Butler had associated dizziness and felt some blurred vision.  Amanda Butler sat back down.  EMS was called.  On arrival in the ED, patient reports resolution of symptoms.  At baseline, patient ambulates with a walker.  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  Amanda Butler is not on a blood thinner medication.     Home Medications Prior to Admission medications   Medication Sig Start Date End Date Taking? Authorizing Provider  acetaminophen (TYLENOL) 500 MG tablet Take 1,000 mg by mouth 2 (two) times daily as needed (pain).    [provider]  albuterol (PROVENTIL) (2.5 MG/3ML) 0.083% nebulizer solution Take 2.5 mg by nebulization See admin instructions. Take 2.5 mg by mouth 1-2 times a day as needed for wheezing and SOB    [provider]  albuterol (VENTOLIN HFA) 108 (90 Base) MCG/ACT inhaler Inhale 2 puffs into the lungs 2 (two) times daily as needed for  shortness of breath or wheezing. 01/14/21   [provider]  carvedilol (COREG) 6.25 MG tablet Take 1 tablet (6.25 mg total) by mouth 2 (two) times daily with a meal. 10/16/21   Mercy Riding, MD  fenofibrate 54 MG tablet Take 1 tablet (54 mg total) by mouth daily. Patient taking differently: Take 54 mg by mouth every morning. 01/24/21 10/15/21  Mariel Aloe, MD  furosemide (LASIX) 40 MG tablet Take 1 tablet (40 mg total) by mouth daily. 10/16/21 01/14/22  Mercy Riding, MD  glimepiride (AMARYL) 4 MG tablet Take 1 tablet (4 mg total) by mouth every morning. 11/19/18 02/10/22  Radene Gunning, NP  lisinopril (ZESTRIL) 10 MG tablet Take 10 mg by mouth daily. 08/06/21   [provider]  Mupirocin (BACTROBAN EX) Apply 1 Application topically daily.    [provider]  Oxymetazoline HCl (NASAL SPRAY NA) Place 2 sprays into the nose 2 (two) times daily as needed (asthma, runny nose).    [provider]  repaglinide (PRANDIN) 1 MG tablet Take 1 mg by mouth daily. 08/19/18   [provider]  rosuvastatin (CRESTOR) 20 MG tablet Take 20 mg by mouth every morning.    [provider]  Skin Protectants, Misc. (EUCERIN) cream Apply 1 application topically daily as needed for dry skin. Patient not taking: Reported on 10/15/2021    [provider]      Allergies    Bee venom,  Iodine, Metformin and related, Other, Shellfish allergy, Actos [pioglitazone], Aspirin, Celecoxib, Ibuprofen, Latex, Morphine and related, Nsaids, [place], and Ciprofloxacin    Review of Systems   Review of Systems  Eyes:  Positive for visual disturbance.  Neurological:  Positive for dizziness and weakness (Bilateral legs).  All other systems reviewed and are negative.   Physical Exam Updated Vital Signs BP (!) 186/82   Pulse 84   Temp 97.9 F (36.6 C) (Oral)   Resp 13   SpO2 99%  Physical Exam Vitals and nursing note reviewed.  Constitutional:      General: Amanda Butler is not in acute  distress.    Appearance: Normal appearance. Amanda Butler is well-developed. Amanda Butler is ill-appearing (Chronically). Amanda Butler is not toxic-appearing or diaphoretic.  HENT:     Head: Normocephalic and atraumatic.     Right Ear: External ear normal.     Left Ear: External ear normal.     Nose: Nose normal.     Mouth/Throat:     Mouth: Mucous membranes are moist.     Pharynx: Oropharynx is clear.  Eyes:     Extraocular Movements: Extraocular movements intact.     Conjunctiva/sclera: Conjunctivae normal.  Cardiovascular:     Rate and Rhythm: Normal rate and regular rhythm.     Heart sounds: No murmur heard. Pulmonary:     Effort: Pulmonary effort is normal. No respiratory distress.     Breath sounds: Normal breath sounds. No wheezing or rales.  Abdominal:     General: There is no distension.     Palpations: Abdomen is soft.     Tenderness: There is no abdominal tenderness.  Musculoskeletal:        General: No swelling. Normal range of motion.     Cervical back: Normal range of motion and neck supple.     Right lower leg: Edema present.     Left lower leg: Edema present.  Skin:    General: Skin is warm and dry.     Coloration: Skin is not jaundiced or pale.  Neurological:     Mental Status: Amanda Butler is alert and oriented to person, place, and time.     Cranial Nerves: Cranial nerves 2-12 are intact. No cranial nerve deficit, dysarthria or facial asymmetry.     Sensory: Sensation is intact. No sensory deficit.     Motor: Motor function is intact. No weakness, tremor, abnormal muscle tone or pronator drift.     Coordination: Coordination is intact. Finger-Nose-Finger Test normal.  Psychiatric:        Mood and Affect: Mood normal.        Behavior: Behavior normal.        Thought Content: Thought content normal.        Judgment: Judgment normal.     ED Results / Procedures / Treatments   Labs (all labs ordered are listed, but only abnormal results are displayed) Labs Reviewed  COMPREHENSIVE METABOLIC  PANEL - Abnormal; Notable for the following components:      Result Value   Potassium 3.3 (*)    Glucose, Bld 278 (*)    Total Bilirubin 1.4 (*)    All other components within normal limits  CBC - Abnormal; Notable for the following components:   RBC 5.39 (*)    Hemoglobin 15.5 (*)    All other components within normal limits  URINALYSIS, ROUTINE W REFLEX MICROSCOPIC - Abnormal; Notable for the following components:   Glucose, UA >=500 (*)    Protein, ur 100 (*)  All other components within normal limits  CBG MONITORING, ED - Abnormal; Notable for the following components:   Glucose-Capillary 277 (*)    All other components within normal limits  RAPID URINE DRUG SCREEN, HOSP PERFORMED  ETHANOL  PROTIME-INR  APTT  BRAIN NATRIURETIC PEPTIDE  TROPONIN I (HIGH SENSITIVITY)  TROPONIN I (HIGH SENSITIVITY)    EKG EKG Interpretation  Date/Time:  Sunday December 29 2021 17:47:51 EDT Ventricular Rate:  87 PR Interval:  178 QRS Duration: 136 QT Interval:  397 QTC Calculation: 478 R Axis:   -29 Text Interpretation: Sinus rhythm Right bundle branch block Left ventricular hypertrophy Confirmed by Godfrey Pick [phone]) on 12/29/2021 6:40:24 PM  Radiology CT HEAD WO CONTRAST  Result Date: 12/29/2021 CLINICAL DATA:  Provided history is neuro deficits/transient ischemic attack. EXAM: CT HEAD WITHOUT CONTRAST TECHNIQUE: Contiguous axial images were obtained from the base of the skull through the vertex without intravenous contrast. RADIATION DOSE REDUCTION: This exam was performed according to the departmental dose-optimization program which includes automated exposure control, adjustment of the mA and/or kV according to patient size and/or use of iterative reconstruction technique. COMPARISON:  05/08/2021 FINDINGS: Brain: No evidence of acute infarction, hemorrhage, hydrocephalus, extra-axial collection or mass lesion/mass effect. Vascular: No hyperdense vessel or unexpected calcification.  Skull: Normal. Negative for fracture or focal lesion. Sinuses/Orbits: Globes and orbits are unremarkable. Visualized sinuses are clear. Other: None. IMPRESSION: No intracranial abnormality.  No change from the prior study. Electronically Signed   By: Lajean Manes M.D.   On: 12/29/2021 18:25   DG Chest 2 View  Result Date: 12/29/2021 CLINICAL DATA:  Chest pain.  Shortness of breath. EXAM: CHEST - 2 VIEW COMPARISON:  October 15, 2021 FINDINGS: The heart size and mediastinal contours are within normal limits. Both lungs are clear. The visualized skeletal structures are unremarkable. IMPRESSION: No active cardiopulmonary disease. Electronically Signed   By: Dorise Bullion III M.D.   On: 12/29/2021 18:15    Procedures Procedures    Medications Ordered in ED Medications  lisinopril (ZESTRIL) tablet 10 mg (10 mg Oral Given 12/29/21 1850)  carvedilol (COREG) tablet 6.25 mg (has no administration in time range)  nitroGLYCERIN (NITROGLYN) 2 % ointment 1 inch (1 inch Topical Given 12/29/21 1850)  potassium chloride SA (KLOR-CON M) CR tablet 40 mEq (40 mEq Oral Given 12/29/21 2009)  magnesium oxide (MAG-OX) tablet 800 mg (800 mg Oral Given 12/29/21 2009)    ED Course/ Medical Decision Making/ A&P                           Medical Decision Making Risk OTC drugs. Prescription drug management.   This patient presents to the ED for concern of dizziness, blurred vision, and bilateral leg weakness, this involves an extensive number of treatment options, and is a complaint that carries with it a high risk of complications and morbidity.  The differential diagnosis includes CVA, TIA, accelerated hypertension, PRES, dehydration, hyperglycemia, hypoglycemia   Co morbidities that complicate the patient evaluation  peripheral edema, DM 2, HTN, asthma, GERD, obesity, HLD, CHF, arthritis   Additional history obtained:  Additional history obtained from N/A External records from outside source obtained and  reviewed including EMR   Lab Tests:  I Ordered, and personally interpreted labs.  The pertinent results include: Mild proteinuria on UA which appears to be baseline based on previous results, no evidence of UTI, normal troponin, normal BNP, mild hypokalemia, moderate hyperglycemia without evidence of DKA,  slight increase in hemoglobin from baseline, no leukocytosis   Imaging Studies ordered:  I ordered imaging studies including chest x-ray, CT head I independently visualized and interpreted imaging which showed no acute findings I agree with the radiologist interpretation   Cardiac Monitoring: / EKG:  The patient was maintained on a cardiac monitor.  I personally viewed and interpreted the cardiac monitored which showed an underlying rhythm of: Sinus rhythm   Problem List / ED Course / Critical interventions / Medication management  Patient is a 66 year old female who presents for transient symptoms of dizziness, blurred vision, and weakness in both legs.  This occurred while visiting her daughter at her daughter's nursing home.  Symptoms have resolved on arrival in the ED.  On arrival in the ED, patient's initial blood pressures were markedly elevated the range of 240 SBP.  Amanda Butler reports that Amanda Butler has been adherent to her home medications.  Patient takes lisinopril, Coreg, and Lasix daily.  Amanda Butler states that Amanda Butler takes these in the mornings.  Upon being bedded in the ED, patient's blood pressure was improving.  On exam, Amanda Butler is very talkative.  Amanda Butler has no focal neurologic deficits.  Lab work and CT imaging of head were ordered.  CT of head did not show acute findings.  Given her elevated blood pressure on arrival and history of similar symptoms with accelerated hypertension in the past, I suspect that her symptoms are secondary to her elevated blood pressure.  There is also possibility of TIA.  Patient was offered MRI but declined stating that Amanda Butler is claustrophobic and does not tolerate laying flat.   Patient's blood pressure was treated with her home medications in addition to nitro ointment.  Amanda Butler had continued improved blood pressures while in the ED.  Patient states that Amanda Butler checks her blood pressures at home and that her baseline is 170-200 SBP.  Amanda Butler would benefit from PCP follow-up for review of her current medications for better blood pressure control.  Patient's lab work is notable only for mild hypokalemia and hyperglycemia.  Patient also states that her blood sugars remain in the 200s at home.  Amanda Butler was given potassium and magnesium replacement in the ED. on reassessment, patient was able to ambulate with a walker, which is her baseline.  Amanda Butler has sustained resolution of all previous symptoms.  Amanda Butler was discharged in stable condition. I ordered medication including NTG ointment, lisinopril, Coreg for hypertension; potassium chloride and magnesium oxide for electrolyte optimization Reevaluation of the patient after these medicines showed that the patient improved I have reviewed the patients home medicines and have made adjustments as needed   Social Determinants of Health:  Has PCP         Final Clinical Impression(s) / ED Diagnoses Final diagnoses:  Dizziness  Weakness of both lower extremities  Essential hypertension    Rx / DC Orders ED Discharge Orders     None         Godfrey Pick, MD 12/29/21 2012

## 2022-01-16 ENCOUNTER — Telehealth: Payer: Self-pay

## 2022-01-16 NOTE — Patient Outreach (Signed)
  Care Coordination   01/16/2022 Name: Amanda Butler MRN: [id number] DOB: 1955-08-08   Care Coordination Outreach Attempts:  An unsuccessful telephone outreach was attempted today to offer the patient information about available care coordination services as a benefit of their health plan.   Follow Up Plan:  Additional outreach attempts will be made to offer the patient care coordination information and services.   Encounter Outcome:  No Answer  Care Coordination Interventions Activated:  No   Care Coordination Interventions:  No, not indicated    [place] [facility] [phone]

## 2022-01-23 ENCOUNTER — Telehealth: Payer: Self-pay

## 2022-01-23 NOTE — Patient Outreach (Signed)
  Care Coordination   01/23/2022 Name: LOUVENIA GOLOMB MRN: [id number] DOB: April 30, 1955   Care Coordination Outreach Attempts:  An unsuccessful telephone outreach was attempted today to offer the patient information about available care coordination services as a benefit of their health plan.   Follow Up Plan:  Additional outreach attempts will be made to offer the patient care coordination information and services.   Encounter Outcome:  No Answer  Care Coordination Interventions Activated:  No   Care Coordination Interventions:  No, not indicated    [facility] [phone]

## 2022-01-28 ENCOUNTER — Other Ambulatory Visit (HOSPITAL_COMMUNITY): Payer: Self-pay

## 2022-02-01 DIAGNOSIS — Z23 Encounter for immunization: Secondary | ICD-10-CM | POA: Diagnosis not present

## 2022-02-24 ENCOUNTER — Emergency Department (HOSPITAL_COMMUNITY): Payer: Medicare Other

## 2022-02-24 ENCOUNTER — Emergency Department (HOSPITAL_COMMUNITY)
Admission: EM | Admit: 2022-02-24 | Discharge: 2022-02-25 | Disposition: A | Discharge status: Home / Self Care | Payer: Medicare Other | Attending: Emergency Medicine | Admitting: Emergency Medicine

## 2022-02-24 ENCOUNTER — Other Ambulatory Visit: Payer: Self-pay

## 2022-02-24 ENCOUNTER — Encounter (HOSPITAL_COMMUNITY): Payer: Self-pay

## 2022-02-24 DIAGNOSIS — I5032 Chronic diastolic (congestive) heart failure: Secondary | ICD-10-CM | POA: Insufficient documentation

## 2022-02-24 DIAGNOSIS — M7989 Other specified soft tissue disorders: Secondary | ICD-10-CM | POA: Diagnosis present

## 2022-02-24 DIAGNOSIS — I110 Hypertensive heart disease with heart failure: Secondary | ICD-10-CM | POA: Insufficient documentation

## 2022-02-24 DIAGNOSIS — R42 Dizziness and giddiness: Secondary | ICD-10-CM | POA: Diagnosis not present

## 2022-02-24 DIAGNOSIS — Z79899 Other long term (current) drug therapy: Secondary | ICD-10-CM | POA: Diagnosis not present

## 2022-02-24 DIAGNOSIS — I10 Essential (primary) hypertension: Secondary | ICD-10-CM | POA: Diagnosis not present

## 2022-02-24 DIAGNOSIS — I83019 Varicose veins of right lower extremity with ulcer of unspecified site: Secondary | ICD-10-CM

## 2022-02-24 DIAGNOSIS — I872 Venous insufficiency (chronic) (peripheral): Secondary | ICD-10-CM | POA: Diagnosis not present

## 2022-02-24 DIAGNOSIS — R202 Paresthesia of skin: Secondary | ICD-10-CM | POA: Diagnosis not present

## 2022-02-24 DIAGNOSIS — J45909 Unspecified asthma, uncomplicated: Secondary | ICD-10-CM | POA: Insufficient documentation

## 2022-02-24 DIAGNOSIS — L03116 Cellulitis of left lower limb: Secondary | ICD-10-CM | POA: Diagnosis not present

## 2022-02-24 DIAGNOSIS — Z91040 Latex allergy status: Secondary | ICD-10-CM | POA: Diagnosis not present

## 2022-02-24 DIAGNOSIS — L03119 Cellulitis of unspecified part of limb: Secondary | ICD-10-CM

## 2022-02-24 DIAGNOSIS — I87313 Chronic venous hypertension (idiopathic) with ulcer of bilateral lower extremity: Secondary | ICD-10-CM | POA: Diagnosis not present

## 2022-02-24 DIAGNOSIS — E119 Type 2 diabetes mellitus without complications: Secondary | ICD-10-CM | POA: Diagnosis not present

## 2022-02-24 DIAGNOSIS — L03115 Cellulitis of right lower limb: Secondary | ICD-10-CM | POA: Insufficient documentation

## 2022-02-24 LAB — CBC WITH DIFFERENTIAL/PLATELET
Abs Immature Granulocytes: 0.02 10*3/uL (ref 0.00–0.07)
Basophils Absolute: 0 10*3/uL (ref 0.0–0.1)
Basophils Relative: 1 %
Eosinophils Absolute: 0.1 10*3/uL (ref 0.0–0.5)
Eosinophils Relative: 1 %
HCT: 43.3 % (ref 36.0–46.0)
Hemoglobin: 14.4 g/dL (ref 12.0–15.0)
Immature Granulocytes: 0 %
Lymphocytes Relative: 10 %
Lymphs Abs: 0.8 10*3/uL (ref 0.7–4.0)
MCH: 28.5 pg (ref 26.0–34.0)
MCHC: 33.3 g/dL (ref 30.0–36.0)
MCV: 85.6 fL (ref 80.0–100.0)
Monocytes Absolute: 0.4 10*3/uL (ref 0.1–1.0)
Monocytes Relative: 5 %
Neutro Abs: 6.8 10*3/uL (ref 1.7–7.7)
Neutrophils Relative %: 83 %
Platelets: 200 10*3/uL (ref 150–400)
RBC: 5.06 MIL/uL (ref 3.87–5.11)
RDW: 14.3 % (ref 11.5–15.5)
WBC: 8.1 10*3/uL (ref 4.0–10.5)
nRBC: 0 % (ref 0.0–0.2)

## 2022-02-24 LAB — BASIC METABOLIC PANEL
Anion gap: 8 (ref 5–15)
BUN: 10 mg/dL (ref 8–23)
CO2: 27 mmol/L (ref 22–32)
Calcium: 8.6 mg/dL — ABNORMAL LOW (ref 8.9–10.3)
Chloride: 103 mmol/L (ref 98–111)
Creatinine, Ser: 0.72 mg/dL (ref 0.44–1.00)
GFR, Estimated: >60 mL/min (ref >60)
Glucose, Bld: 373 mg/dL — ABNORMAL HIGH (ref 70–99)
Potassium: 3.3 mmol/L — ABNORMAL LOW (ref 3.5–5.1)
Sodium: 138 mmol/L (ref 135–145)

## 2022-02-24 NOTE — ED Provider Triage Note (Signed)
Emergency Medicine Provider Triage Evaluation Note  Amanda Butler , a 66 y.o. female  was evaluated in triage.  Pt complains of left arm, shoulder, neck pain starting this morning.  Described the pain as dull, constant.  History of CVA.  Reports left worse than right leg pain that makes it difficult for her to walk.  Reports shortness of breath with ambulation.  Denies chest pain, nausea, vomiting, urinary symptoms, bowel changes. Review of Systems  Positive: As above Negative: As above  Physical Exam  BP (!) 195/84   Pulse 99   Temp 98.4 F (36.9 C) (Oral)   Resp 18   Ht '5\' 6"'$  (1.676 m)   Wt 99.8 kg   SpO2 93%   BMI 35.51 kg/m  Gen:   Awake, no distress   Resp:  Normal effort  MSK:   Moves extremities without difficulty  Other:  TTP to left sided neck, shoulder, arm.  Medical Decision Making  Medically screening exam initiated at 3:44 PM.  Appropriate orders placed.  Amanda Butler was informed that the remainder of the evaluation will be completed by another provider, this initial triage assessment does not replace that evaluation, and the importance of remaining in the ED until their evaluation is complete.     Rex Kras, [place] 02/25/22 [phone]

## 2022-02-24 NOTE — ED Triage Notes (Addendum)
Patient c/o left neck and left shoulder pain since waking this AM. Patient also reports that she has had nausea and dizziness today.  Patient added that she has open wounds to bilateral lower extremities and states that the wounds are draining.

## 2022-02-25 MED ORDER — LISINOPRIL 10 MG PO TABS
10.0000 mg | ORAL_TABLET | Freq: Once | ORAL | Status: AC
  Administered 2022-02-25: 10 mg via ORAL
  Filled 2022-02-25: qty 1

## 2022-02-25 MED ORDER — DEXTROSE 5 % IV SOLN
1500.0000 mg | Freq: Once | INTRAVENOUS | Status: AC
  Administered 2022-02-25: 1500 mg via INTRAVENOUS
  Filled 2022-02-25: qty 75

## 2022-02-25 MED ORDER — CARVEDILOL 3.125 MG PO TABS
6.2500 mg | ORAL_TABLET | Freq: Once | ORAL | Status: AC
  Administered 2022-02-25: 6.25 mg via ORAL
  Filled 2022-02-25: qty 2

## 2022-02-25 NOTE — ED Provider Notes (Signed)
[facility] DEPT Provider Note  CSN: [id number] Arrival date & time: 02/24/22 1428  Chief Complaint(s) Nausea, Dizziness, and leg wounds  HPI Amanda Butler is a 66 y.o. female with a past medical history listed below including type 2 diabetes, chronic diastolic heart failure with venous stasis and recurrent venous stasis ulcers who presents to the ED for several weeks of bilateral lower extremity swelling and redness who developed open wounds over the past several days.  She denies any falls or trauma.  Reports that she is trying to get into see the wound clinic and her PCP but has not been able to.  Additionally patient is endorsing left shoulder pain that has been chronic and intermittent in nature.  Is consistent with muscle strain/spasm of the left shoulder girdle musculature.  Again she denied any recent falls or traumas.  Reports that this is related to a prior accident. Patient also reported lightheadedness.  The history is provided by the patient.    Past Medical History Past Medical History:  Diagnosis Date   Arthritis    "knees" (09/13/2015)   Asthma    Cellulitis 11/17/2018   LEFT LEG    Chronic diastolic CHF (congestive heart failure) ([place])    a. 09/2015 Echo: EF 55-60%, mild LVH, gr2DD, no rwma, mild MR, mildly dil LA.    GERD (gastroesophageal reflux disease)    High cholesterol    Hypertension    Midsternal chest pain    a. 09/2015 MV: no ischemia/infarct, EF 61%; b. 09/2015 Low prob V:Q scan.   Morbid obesity ([place])    Pneumonia ~ 2000   "mild case"   RBBB (right bundle branch block) approx 2010   Dr Marisue Humble did ECG and told her   Type II diabetes mellitus ([place])    Patient Active Problem List   Diagnosis Date Noted   Noncompliance 10/16/2021   UTI (urinary tract infection) 05/08/2021   Candidal skin infection 05/07/2021   Low TSH level 05/07/2021   Prolonged QT interval 05/07/2021   Postural dizziness with presyncope 05/07/2021    Cough    Urinary tract infection without hematuria    Venous stasis 05/06/2021   Sepsis ([place]) 05/06/2021   Dehydration 05/06/2021   Cellulitis of left lower extremity 02/19/2021   Acute lower UTI 01/21/2021   Hypokalemia 01/21/2021   Hypertensive urgency 01/21/2021   Ulcers of both lower extremities, limited to breakdown of skin ([place])    Cellulitis of left leg 11/16/2018   Accelerated hypertension 09/19/2018   Pressure injury of skin 02/20/2018   Cellulitis 10/02/2017   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   Closed nondisplaced fracture of greater tuberosity of left humerus 04/23/2017   Type II diabetes mellitus ([place])    High cholesterol    Morbid obesity ([place])    Chronic diastolic CHF (congestive heart failure) ([place])    Midsternal chest pain    Pain in the chest    Essential hypertension    Morbid obesity due to excess calories ([place])    Chest pain 09/13/2015   Bilateral lower extremity edema 09/13/2015   Diabetes mellitus type 2, diet-controlled ([place]) 09/13/2015   Obesity 09/13/2015   Total bilirubin, elevated 09/13/2015   HTN (hypertension) 09/13/2015   Stasis dermatitis of both legs 09/13/2015   Left leg cellulitis 09/13/2015   RBBB 09/13/2015   Asthma 09/13/2015   GERD (gastroesophageal reflux disease) 09/13/2015   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   Home Medication(s) Prior to Admission  medications   Medication Sig Start Date End Date Taking? Authorizing Provider  acetaminophen (TYLENOL) 500 MG tablet Take 1,000 mg by mouth 2 (two) times daily as needed (pain).    [provider]  albuterol (PROVENTIL) (2.5 MG/3ML) 0.083% nebulizer solution Take 2.5 mg by nebulization See admin instructions. Take 2.5 mg by mouth 1-2 times a day as needed for wheezing and SOB    [provider]  albuterol (VENTOLIN HFA) 108 (90 Base) MCG/ACT inhaler Inhale 2 puffs into the lungs 2 (two) times daily as needed for shortness of breath or  wheezing. 01/14/21   [provider]  carvedilol (COREG) 6.25 MG tablet Take 1 tablet (6.25 mg total) by mouth 2 (two) times daily with a meal. 10/16/21   Mercy Riding, MD  fenofibrate 54 MG tablet Take 1 tablet (54 mg total) by mouth daily. Patient taking differently: Take 54 mg by mouth every morning. 01/24/21 10/15/21  Mariel Aloe, MD  furosemide (LASIX) 40 MG tablet Take 1 tablet (40 mg total) by mouth daily. 10/16/21 01/14/22  Mercy Riding, MD  glimepiride (AMARYL) 4 MG tablet Take 1 tablet (4 mg total) by mouth every morning. 11/19/18 02/10/22  Radene Gunning, NP  lisinopril (ZESTRIL) 10 MG tablet Take 10 mg by mouth daily. 08/06/21   [provider]  Mupirocin (BACTROBAN EX) Apply 1 Application topically daily.    [provider]  Oxymetazoline HCl (NASAL SPRAY NA) Place 2 sprays into the nose 2 (two) times daily as needed (asthma, runny nose).    [provider]  repaglinide (PRANDIN) 1 MG tablet Take 1 mg by mouth daily. 08/19/18   [provider]  rosuvastatin (CRESTOR) 20 MG tablet Take 20 mg by mouth every morning.    [provider]  Skin Protectants, Misc. (EUCERIN) cream Apply 1 application topically daily as needed for dry skin. Patient not taking: Reported on 10/15/2021    [provider]                                                                                                                                    Allergies Bee venom, Iodine, Metformin and related, Other, Shellfish allergy, Actos [pioglitazone], Aspirin, Celecoxib, Ibuprofen, Latex, Morphine and related, Nsaids, [place], and Ciprofloxacin  Review of Systems Review of Systems As noted in HPI  Physical Exam Vital Signs  I have reviewed the triage vital signs BP (!) 168/67 (BP Location: Right Arm)   Pulse 88   Temp 98 F (36.7 C) (Oral)   Resp 19   Ht '5\' 6"'$  (1.676 m)   Wt 99.8 kg   SpO2 94%   BMI 35.51 kg/m   Physical Exam Vitals reviewed.   Constitutional:      General: She is not in acute distress.    Appearance: She is well-developed. She is not diaphoretic.  HENT:     Head: Normocephalic and atraumatic.  Nose: Nose normal.  Eyes:     General: No scleral icterus.       Right eye: No discharge.        Left eye: No discharge.     Conjunctiva/sclera: Conjunctivae normal.     Pupils: Pupils are equal, round, and reactive to light.  Cardiovascular:     Rate and Rhythm: Normal rate and regular rhythm.     Heart sounds: No murmur heard.    No friction rub. No gallop.  Pulmonary:     Effort: Pulmonary effort is normal. No respiratory distress.     Breath sounds: Normal breath sounds. No stridor. No rales.  Abdominal:     General: There is no distension.     Palpations: Abdomen is soft.     Tenderness: There is no abdominal tenderness.  Musculoskeletal:     Right shoulder: Tenderness present.       Arms:     Cervical back: Normal range of motion and neck supple.     Comments: BLE edema and erythema with stasis ulcer (see images)  Skin:    General: Skin is warm and dry.     Findings: No erythema or rash.  Neurological:     Mental Status: She is alert and oriented to person, place, and time.          ED Results and Treatments Labs (all labs ordered are listed, but only abnormal results are displayed) Labs Reviewed  BASIC METABOLIC PANEL - Abnormal; Notable for the following components:      Result Value   Potassium 3.3 (*)    Glucose, Bld 373 (*)    Calcium 8.6 (*)    All other components within normal limits  CBC WITH DIFFERENTIAL/PLATELET                                                                                                                         EKG  EKG Interpretation  Date/Time:  Monday February 24 2022 16:06:10 EST Ventricular Rate:  87 PR Interval:  191 QRS Duration: 129 QT Interval:  395 QTC Calculation: 476 R Axis:   -17 Text Interpretation: Sinus rhythm Right bundle branch  block Left ventricular hypertrophy similar to prior Confirmed by Wynona Dove ([id number]) on 02/24/2022 7:09:10 PM       Radiology CT Head Wo Contrast  Result Date: 02/24/2022 CLINICAL DATA:  Dizziness, nausea, left neck and left shoulder paresthesia EXAM: CT HEAD WITHOUT CONTRAST TECHNIQUE: Contiguous axial images were obtained from the base of the skull through the vertex without intravenous contrast. RADIATION DOSE REDUCTION: This exam was performed according to the departmental dose-optimization program which includes automated exposure control, adjustment of the mA and/or kV according to patient size and/or use of iterative reconstruction technique. COMPARISON:  12/29/2021 FINDINGS: Brain: No acute intracranial findings are seen. There are no signs of bleeding within the cranium. Ventricles are not dilated. There is no focal edema or mass effect. No significant interval changes are noted.  Vascular: Unremarkable. Skull: Hyperostosis frontalis interna is seen. Sinuses/Orbits: Unremarkable. Other: There is increased amount of CSF in the sella suggesting partial empty sella. IMPRESSION: No acute intracranial findings are seen in noncontrast CT brain. Electronically Signed   By: Elmer Picker M.D.   On: 02/24/2022 16:56    Medications Ordered in ED Medications  dalbavancin (DALVANCE) 1,500 mg in dextrose 5 % 500 mL IVPB (0 mg Intravenous Stopped 02/25/22 0713)  carvedilol (COREG) tablet 6.25 mg (6.25 mg Oral Given 02/25/22 0621)  lisinopril (ZESTRIL) tablet 10 mg (10 mg Oral Given 02/25/22 9379)                                                                                                                                     Procedures Procedures  (including critical care time)  Medical Decision Making / ED Course   Medical Decision Making Amount and/or Complexity of Data Reviewed Labs: ordered. Decision-making details documented in ED Course. Radiology: ordered and independent  interpretation performed. Decision-making details documented in ED Course. ECG/medicine tests: ordered and independent interpretation performed. Decision-making details documented in ED Course.  Risk Prescription drug management.    Patient presents with bilateral lower extremity wounds with edema and area edema.  On review of records, patient has had previous venous stasis ulcers.  She is got intact pulses so I have low suspicion for arterial occlusion.  She has had several negative ultrasounds for DVT.  Patient was seen in the MSE process and had screening labs obtained: Her CBC she was negative for leukocytosis or anemia.  Metabolic panel had hyperglycemia without DKA.  No renal sufficiency.  We will treat patient with Dalvance.  Patient also presented with left shoulder pain which is chronic and intermittent.  Consistent with muscle strain/spasm of the left shoulder girdle musculature.  Given the associated lightheadedness, there the MSE process the patient had a CT scan obtained that was negative for any acute findings including ICH or mass effect.  Doubt CVA.  Doubt cardiac etiology.        Final Clinical Impression(s) / ED Diagnoses Final diagnoses:  Venous stasis ulcers of both lower extremities ([place])  Cellulitis of lower extremity, unspecified laterality   The patient appears reasonably screened and/or stabilized for discharge and I doubt any other medical condition or other [facility] requiring further screening, evaluation, or treatment in the ED at this time. I have discussed the findings, Dx and Tx plan with the patient/family who expressed understanding and agree(s) with the plan. Discharge instructions discussed at length. The patient/family was given strict return precautions who verbalized understanding of the instructions. No further questions at time of discharge.  Disposition: Discharge  Condition: Good  ED Discharge Orders          Ordered    Ambulatory referral to  Infectious Disease       Comments: Cellulitis patient:  Received dalbavancin on 02/25/2022.   02/25/22 0240  Follow Up: Gaynelle Arabian, MD [address] [place] [address] [phone]  Call  to schedule an appointment for close follow up           This chart was dictated using voice recognition software.  Despite best efforts to proofread,  errors can occur which can change the documentation meaning.    Fatima Blank, MD 02/25/22 [phone]

## 2022-03-03 ENCOUNTER — Telehealth: Payer: Self-pay

## 2022-03-03 DIAGNOSIS — H2511 Age-related nuclear cataract, right eye: Secondary | ICD-10-CM | POA: Diagnosis not present

## 2022-03-03 DIAGNOSIS — H538 Other visual disturbances: Secondary | ICD-10-CM | POA: Diagnosis not present

## 2022-03-03 DIAGNOSIS — E119 Type 2 diabetes mellitus without complications: Secondary | ICD-10-CM | POA: Diagnosis not present

## 2022-03-03 NOTE — Telephone Encounter (Signed)
     Patient  visit on 11/14  at [facility]   Have you been able to follow up with your primary care physician? Yes   The patient was or was not able to obtain any needed medicine or equipment. Yes   Are there diet recommendations that you are having difficulty following? Yes   Patient expresses understanding of discharge instructions and education provided has no other needs at this time.  Yes      [place], [facility], Care Management  [phone] [address], [place], [place] [address] Phone: [phone] Email: [email or web address]

## 2022-03-10 ENCOUNTER — Ambulatory Visit: Payer: Medicare Other | Admitting: Internal Medicine

## 2022-03-17 ENCOUNTER — Ambulatory Visit: Payer: Medicare Other | Admitting: Internal Medicine

## 2022-03-21 ENCOUNTER — Other Ambulatory Visit (HOSPITAL_COMMUNITY): Payer: Self-pay

## 2022-04-18 ENCOUNTER — Other Ambulatory Visit (HOSPITAL_COMMUNITY): Payer: Self-pay

## 2022-05-29 ENCOUNTER — Encounter (HOSPITAL_COMMUNITY): Payer: Self-pay

## 2022-05-29 ENCOUNTER — Other Ambulatory Visit: Payer: Self-pay

## 2022-05-29 ENCOUNTER — Inpatient Hospital Stay (HOSPITAL_COMMUNITY)
Admission: EM | Admit: 2022-05-29 | Discharge: 2022-06-02 | DRG: 305 | Disposition: A | Discharge status: Home / Self Care | Payer: Medicare HMO | Attending: Internal Medicine | Admitting: Internal Medicine

## 2022-05-29 ENCOUNTER — Emergency Department (HOSPITAL_COMMUNITY): Payer: Medicare HMO

## 2022-05-29 DIAGNOSIS — N3000 Acute cystitis without hematuria: Secondary | ICD-10-CM

## 2022-05-29 DIAGNOSIS — R600 Localized edema: Secondary | ICD-10-CM

## 2022-05-29 DIAGNOSIS — Z833 Family history of diabetes mellitus: Secondary | ICD-10-CM

## 2022-05-29 DIAGNOSIS — I5032 Chronic diastolic (congestive) heart failure: Secondary | ICD-10-CM | POA: Diagnosis present

## 2022-05-29 DIAGNOSIS — Z6835 Body mass index (BMI) 35.0-35.9, adult: Secondary | ICD-10-CM

## 2022-05-29 DIAGNOSIS — E7800 Pure hypercholesterolemia, unspecified: Secondary | ICD-10-CM

## 2022-05-29 DIAGNOSIS — Z91040 Latex allergy status: Secondary | ICD-10-CM

## 2022-05-29 DIAGNOSIS — R0602 Shortness of breath: Secondary | ICD-10-CM | POA: Diagnosis not present

## 2022-05-29 DIAGNOSIS — N390 Urinary tract infection, site not specified: Secondary | ICD-10-CM | POA: Diagnosis present

## 2022-05-29 DIAGNOSIS — L97921 Non-pressure chronic ulcer of unspecified part of left lower leg limited to breakdown of skin: Secondary | ICD-10-CM | POA: Diagnosis not present

## 2022-05-29 DIAGNOSIS — Z825 Family history of asthma and other chronic lower respiratory diseases: Secondary | ICD-10-CM

## 2022-05-29 DIAGNOSIS — E785 Hyperlipidemia, unspecified: Secondary | ICD-10-CM | POA: Diagnosis present

## 2022-05-29 DIAGNOSIS — E119 Type 2 diabetes mellitus without complications: Secondary | ICD-10-CM

## 2022-05-29 DIAGNOSIS — R200 Anesthesia of skin: Secondary | ICD-10-CM | POA: Diagnosis present

## 2022-05-29 DIAGNOSIS — Z882 Allergy status to sulfonamides status: Secondary | ICD-10-CM

## 2022-05-29 DIAGNOSIS — L97911 Non-pressure chronic ulcer of unspecified part of right lower leg limited to breakdown of skin: Secondary | ICD-10-CM | POA: Diagnosis not present

## 2022-05-29 DIAGNOSIS — R42 Dizziness and giddiness: Secondary | ICD-10-CM | POA: Diagnosis present

## 2022-05-29 DIAGNOSIS — R531 Weakness: Secondary | ICD-10-CM | POA: Diagnosis present

## 2022-05-29 DIAGNOSIS — Z888 Allergy status to other drugs, medicaments and biological substances status: Secondary | ICD-10-CM

## 2022-05-29 DIAGNOSIS — R519 Headache, unspecified: Secondary | ICD-10-CM | POA: Diagnosis not present

## 2022-05-29 DIAGNOSIS — E876 Hypokalemia: Secondary | ICD-10-CM | POA: Diagnosis not present

## 2022-05-29 DIAGNOSIS — I160 Hypertensive urgency: Secondary | ICD-10-CM | POA: Diagnosis not present

## 2022-05-29 DIAGNOSIS — Z66 Do not resuscitate: Secondary | ICD-10-CM | POA: Diagnosis present

## 2022-05-29 DIAGNOSIS — Z79899 Other long term (current) drug therapy: Secondary | ICD-10-CM

## 2022-05-29 DIAGNOSIS — R55 Syncope and collapse: Secondary | ICD-10-CM | POA: Diagnosis present

## 2022-05-29 DIAGNOSIS — I872 Venous insufficiency (chronic) (peripheral): Secondary | ICD-10-CM | POA: Diagnosis present

## 2022-05-29 DIAGNOSIS — M7989 Other specified soft tissue disorders: Secondary | ICD-10-CM | POA: Diagnosis present

## 2022-05-29 DIAGNOSIS — Z886 Allergy status to analgesic agent status: Secondary | ICD-10-CM

## 2022-05-29 DIAGNOSIS — E1151 Type 2 diabetes mellitus with diabetic peripheral angiopathy without gangrene: Secondary | ICD-10-CM | POA: Diagnosis present

## 2022-05-29 DIAGNOSIS — Z1611 Resistance to penicillins: Secondary | ICD-10-CM | POA: Diagnosis present

## 2022-05-29 DIAGNOSIS — Z7984 Long term (current) use of oral hypoglycemic drugs: Secondary | ICD-10-CM

## 2022-05-29 DIAGNOSIS — K219 Gastro-esophageal reflux disease without esophagitis: Secondary | ICD-10-CM | POA: Diagnosis not present

## 2022-05-29 DIAGNOSIS — R0902 Hypoxemia: Secondary | ICD-10-CM | POA: Diagnosis present

## 2022-05-29 DIAGNOSIS — Z885 Allergy status to narcotic agent status: Secondary | ICD-10-CM

## 2022-05-29 DIAGNOSIS — J45909 Unspecified asthma, uncomplicated: Secondary | ICD-10-CM | POA: Diagnosis present

## 2022-05-29 DIAGNOSIS — Z801 Family history of malignant neoplasm of trachea, bronchus and lung: Secondary | ICD-10-CM

## 2022-05-29 DIAGNOSIS — Z1152 Encounter for screening for COVID-19: Secondary | ICD-10-CM

## 2022-05-29 DIAGNOSIS — E6609 Other obesity due to excess calories: Secondary | ICD-10-CM | POA: Diagnosis present

## 2022-05-29 DIAGNOSIS — G8929 Other chronic pain: Secondary | ICD-10-CM | POA: Diagnosis present

## 2022-05-29 DIAGNOSIS — F40240 Claustrophobia: Secondary | ICD-10-CM | POA: Diagnosis present

## 2022-05-29 DIAGNOSIS — Z8701 Personal history of pneumonia (recurrent): Secondary | ICD-10-CM

## 2022-05-29 DIAGNOSIS — B961 Klebsiella pneumoniae [K. pneumoniae] as the cause of diseases classified elsewhere: Secondary | ICD-10-CM | POA: Diagnosis present

## 2022-05-29 DIAGNOSIS — M170 Bilateral primary osteoarthritis of knee: Secondary | ICD-10-CM | POA: Diagnosis present

## 2022-05-29 DIAGNOSIS — M5441 Lumbago with sciatica, right side: Secondary | ICD-10-CM | POA: Diagnosis present

## 2022-05-29 DIAGNOSIS — Z8249 Family history of ischemic heart disease and other diseases of the circulatory system: Secondary | ICD-10-CM

## 2022-05-29 DIAGNOSIS — I878 Other specified disorders of veins: Secondary | ICD-10-CM | POA: Diagnosis present

## 2022-05-29 DIAGNOSIS — I10 Essential (primary) hypertension: Secondary | ICD-10-CM | POA: Diagnosis not present

## 2022-05-29 DIAGNOSIS — I110 Hypertensive heart disease with heart failure: Secondary | ICD-10-CM | POA: Diagnosis present

## 2022-05-29 DIAGNOSIS — R030 Elevated blood-pressure reading, without diagnosis of hypertension: Secondary | ICD-10-CM

## 2022-05-29 DIAGNOSIS — Z9109 Other allergy status, other than to drugs and biological substances: Secondary | ICD-10-CM

## 2022-05-29 LAB — URINALYSIS, ROUTINE W REFLEX MICROSCOPIC
Bilirubin Urine: NEGATIVE
Glucose, UA: >=500 mg/dL — AB
Ketones, ur: 5 mg/dL — AB
Nitrite: NEGATIVE
Protein, ur: 30 mg/dL — AB
Specific Gravity, Urine: 1.028 (ref 1.005–1.030)
WBC, UA: >50 WBC/hpf (ref 0–5)
pH: 6 (ref 5.0–8.0)

## 2022-05-29 LAB — COMPREHENSIVE METABOLIC PANEL
ALT: 10 U/L (ref 0–44)
AST: 15 U/L (ref 15–41)
Albumin: 3.4 g/dL — ABNORMAL LOW (ref 3.5–5.0)
Alkaline Phosphatase: 85 U/L (ref 38–126)
Anion gap: 13 (ref 5–15)
BUN: 13 mg/dL (ref 8–23)
CO2: 23 mmol/L (ref 22–32)
Calcium: 8.9 mg/dL (ref 8.9–10.3)
Chloride: 104 mmol/L (ref 98–111)
Creatinine, Ser: 0.91 mg/dL (ref 0.44–1.00)
GFR, Estimated: >60 mL/min (ref >60)
Glucose, Bld: 371 mg/dL — ABNORMAL HIGH (ref 70–99)
Potassium: 4 mmol/L (ref 3.5–5.1)
Sodium: 140 mmol/L (ref 135–145)
Total Bilirubin: 0.8 mg/dL (ref 0.3–1.2)
Total Protein: 8.1 g/dL (ref 6.5–8.1)

## 2022-05-29 LAB — CBC WITH DIFFERENTIAL/PLATELET
Abs Immature Granulocytes: 0.03 10*3/uL (ref 0.00–0.07)
Basophils Absolute: 0.1 10*3/uL (ref 0.0–0.1)
Basophils Relative: 1 %
Eosinophils Absolute: 0.2 10*3/uL (ref 0.0–0.5)
Eosinophils Relative: 2 %
HCT: 42.5 % (ref 36.0–46.0)
Hemoglobin: 13.8 g/dL (ref 12.0–15.0)
Immature Granulocytes: 1 %
Lymphocytes Relative: 12 %
Lymphs Abs: 0.7 10*3/uL (ref 0.7–4.0)
MCH: 27.8 pg (ref 26.0–34.0)
MCHC: 32.5 g/dL (ref 30.0–36.0)
MCV: 85.5 fL (ref 80.0–100.0)
Monocytes Absolute: 0.3 10*3/uL (ref 0.1–1.0)
Monocytes Relative: 5 %
Neutro Abs: 5 10*3/uL (ref 1.7–7.7)
Neutrophils Relative %: 79 %
Platelets: 280 10*3/uL (ref 150–400)
RBC: 4.97 MIL/uL (ref 3.87–5.11)
RDW: 13.6 % (ref 11.5–15.5)
WBC: 6.2 10*3/uL (ref 4.0–10.5)
nRBC: 0 % (ref 0.0–0.2)

## 2022-05-29 LAB — RESP PANEL BY RT-PCR (RSV, FLU A&B, COVID)  RVPGX2
Influenza A by PCR: NEGATIVE
Influenza B by PCR: NEGATIVE
Resp Syncytial Virus by PCR: NEGATIVE
SARS Coronavirus 2 by RT PCR: NEGATIVE

## 2022-05-29 LAB — BRAIN NATRIURETIC PEPTIDE: B Natriuretic Peptide: 81.3 pg/mL (ref 0.0–100.0)

## 2022-05-29 LAB — CBG MONITORING, ED: Glucose-Capillary: 252 mg/dL — ABNORMAL HIGH (ref 70–99)

## 2022-05-29 LAB — TROPONIN I (HIGH SENSITIVITY): Troponin I (High Sensitivity): 10 ng/L (ref <18)

## 2022-05-29 MED ORDER — PANTOPRAZOLE SODIUM 40 MG PO TBEC
40.0000 mg | DELAYED_RELEASE_TABLET | Freq: Every day | ORAL | Status: DC
  Administered 2022-05-31 – 2022-06-02 (×3): 40 mg via ORAL
  Filled 2022-05-29 (×4): qty 1

## 2022-05-29 MED ORDER — LISINOPRIL 20 MG PO TABS
20.0000 mg | ORAL_TABLET | Freq: Every day | ORAL | Status: DC
  Administered 2022-05-30: 20 mg via ORAL
  Filled 2022-05-29: qty 1
  Filled 2022-05-29: qty 2

## 2022-05-29 MED ORDER — INSULIN ASPART 100 UNIT/ML IJ SOLN
0.0000 [IU] | Freq: Three times a day (TID) | INTRAMUSCULAR | Status: DC
  Administered 2022-05-30: 5 [IU] via SUBCUTANEOUS
  Administered 2022-05-30: 15 [IU] via SUBCUTANEOUS
  Administered 2022-05-30: 11 [IU] via SUBCUTANEOUS
  Administered 2022-05-31: 8 [IU] via SUBCUTANEOUS
  Administered 2022-05-31: 5 [IU] via SUBCUTANEOUS
  Administered 2022-05-31 – 2022-06-01 (×2): 8 [IU] via SUBCUTANEOUS
  Administered 2022-06-01: 5 [IU] via SUBCUTANEOUS
  Administered 2022-06-01: 8 [IU] via SUBCUTANEOUS
  Administered 2022-06-02: 5 [IU] via SUBCUTANEOUS
  Filled 2022-05-29: qty 0.15

## 2022-05-29 MED ORDER — POLYETHYLENE GLYCOL 3350 17 G PO PACK: 17.0000 g | PACK | Freq: Every day | ORAL | Status: DC | PRN

## 2022-05-29 MED ORDER — FENOFIBRATE 54 MG PO TABS
54.0000 mg | ORAL_TABLET | Freq: Every morning | ORAL | Status: DC
  Administered 2022-05-30 – 2022-06-02 (×4): 54 mg via ORAL
  Filled 2022-05-29 (×5): qty 1

## 2022-05-29 MED ORDER — HYDRALAZINE HCL 20 MG/ML IJ SOLN: 10.0000 mg | Freq: Four times a day (QID) | INTRAMUSCULAR | Status: DC | PRN

## 2022-05-29 MED ORDER — FUROSEMIDE 40 MG PO TABS
40.0000 mg | ORAL_TABLET | Freq: Every day | ORAL | Status: DC
  Administered 2022-05-30 – 2022-06-02 (×4): 40 mg via ORAL
  Filled 2022-05-29 (×4): qty 1

## 2022-05-29 MED ORDER — SODIUM CHLORIDE 0.9 % IV SOLN
1.0000 g | Freq: Once | INTRAVENOUS | Status: AC
  Administered 2022-05-29: 1 g via INTRAVENOUS
  Filled 2022-05-29: qty 10

## 2022-05-29 MED ORDER — SODIUM CHLORIDE 0.9 % IV SOLN: 250.0000 mL | INTRAVENOUS | Status: DC | PRN

## 2022-05-29 MED ORDER — ROSUVASTATIN CALCIUM 20 MG PO TABS
20.0000 mg | ORAL_TABLET | Freq: Every morning | ORAL | Status: DC
  Administered 2022-05-30 – 2022-06-02 (×4): 20 mg via ORAL
  Filled 2022-05-29 (×4): qty 1

## 2022-05-29 MED ORDER — SODIUM CHLORIDE 0.9% FLUSH
3.0000 mL | Freq: Two times a day (BID) | INTRAVENOUS | Status: DC
  Administered 2022-05-30 – 2022-06-01 (×5): 3 mL via INTRAVENOUS

## 2022-05-29 MED ORDER — SORBITOL 70 % SOLN: 30.0000 mL | Freq: Every day | Status: DC | PRN

## 2022-05-29 MED ORDER — ALBUTEROL SULFATE (2.5 MG/3ML) 0.083% IN NEBU: 2.5000 mg | INHALATION_SOLUTION | Freq: Two times a day (BID) | RESPIRATORY_TRACT | Status: DC | PRN

## 2022-05-29 MED ORDER — ENOXAPARIN SODIUM 40 MG/0.4ML IJ SOSY
40.0000 mg | PREFILLED_SYRINGE | INTRAMUSCULAR | Status: DC
  Administered 2022-05-30 – 2022-06-01 (×3): 40 mg via SUBCUTANEOUS
  Filled 2022-05-29 (×3): qty 0.4

## 2022-05-29 MED ORDER — SODIUM CHLORIDE 0.9 % IV SOLN
2.0000 g | INTRAVENOUS | Status: DC
  Administered 2022-05-30 – 2022-05-31 (×2): 2 g via INTRAVENOUS
  Filled 2022-05-29 (×2): qty 20

## 2022-05-29 MED ORDER — ACETAMINOPHEN 650 MG RE SUPP: 650.0000 mg | Freq: Four times a day (QID) | RECTAL | Status: DC | PRN

## 2022-05-29 MED ORDER — ALBUTEROL SULFATE HFA 108 (90 BASE) MCG/ACT IN AERS: 2.0000 | INHALATION_SPRAY | Freq: Two times a day (BID) | RESPIRATORY_TRACT | Status: DC | PRN

## 2022-05-29 MED ORDER — HYDROCERIN EX CREA: 1.0000 | TOPICAL_CREAM | Freq: Every day | CUTANEOUS | Status: DC | PRN

## 2022-05-29 MED ORDER — OXYMETAZOLINE HCL 0.05 % NA SOLN
1.0000 | Freq: Two times a day (BID) | NASAL | Status: AC | PRN
  Filled 2022-05-29 (×2): qty 15

## 2022-05-29 MED ORDER — NASAL SPRAY 0.05 % NA SOLN: Freq: Two times a day (BID) | NASAL | Status: DC | PRN

## 2022-05-29 MED ORDER — LABETALOL HCL 5 MG/ML IV SOLN
10.0000 mg | Freq: Once | INTRAVENOUS | Status: AC
  Administered 2022-05-29: 10 mg via INTRAVENOUS
  Filled 2022-05-29: qty 4

## 2022-05-29 MED ORDER — LISINOPRIL 10 MG PO TABS: 10.0000 mg | ORAL_TABLET | Freq: Every day | ORAL | Status: DC

## 2022-05-29 MED ORDER — ACETAMINOPHEN 325 MG PO TABS: 650.0000 mg | ORAL_TABLET | Freq: Four times a day (QID) | ORAL | Status: DC | PRN

## 2022-05-29 MED ORDER — ONDANSETRON HCL 4 MG/2ML IJ SOLN: 4.0000 mg | Freq: Four times a day (QID) | INTRAMUSCULAR | Status: DC | PRN

## 2022-05-29 MED ORDER — ONDANSETRON HCL 4 MG PO TABS: 4.0000 mg | ORAL_TABLET | Freq: Four times a day (QID) | ORAL | Status: DC | PRN

## 2022-05-29 MED ORDER — CARVEDILOL 6.25 MG PO TABS
6.2500 mg | ORAL_TABLET | Freq: Two times a day (BID) | ORAL | Status: DC
  Administered 2022-05-30 – 2022-06-02 (×7): 6.25 mg via ORAL
  Filled 2022-05-29 (×7): qty 1
  Filled 2022-05-29: qty 2

## 2022-05-29 MED ORDER — SODIUM CHLORIDE 0.9% FLUSH: 3.0000 mL | INTRAVENOUS | Status: DC | PRN

## 2022-05-29 MED ORDER — OXYCODONE HCL 5 MG PO TABS
5.0000 mg | ORAL_TABLET | ORAL | Status: DC | PRN
  Administered 2022-05-30: 5 mg via ORAL
  Filled 2022-05-29: qty 1

## 2022-05-29 NOTE — ED Provider Triage Note (Signed)
Emergency Medicine Provider Triage Evaluation Note  Amanda Butler , a 67 y.o. female  was evaluated in triage.  Pt complains of multiple complaints.  States she has been having intermittent bilateral blurry vision for the last week, headaches, nausea, shortness of breath and lower extremity swelling.  Also states she is some dysuria and vaginal itching, concerned about a UTI.  On blood pressure medicine, no missed doses for today per patient.  Denies chest pain but does feel short of breath.  Review of Systems  Per HPI  Physical Exam  BP (!) 221/107 (BP Location: Left Arm)   Pulse (!) 105   Temp 98.7 F (37.1 C) (Oral)   Resp 18   Wt 99 kg   SpO2 98%   BMI 35.23 kg/m  Gen:   Awake, no distress   Resp:  Normal effort  MSK:   Moves extremities without difficulty  Other:  Upper and lower extremity pulses are symmetric, pitting edema lower extremities worse left  Medical Decision Making  Medically screening exam initiated at 1:39 PM.  Appropriate orders placed.  ZULMA LANGAGER was informed that the remainder of the evaluation will be completed by another provider, this initial triage assessment does not replace that evaluation, and the importance of remaining in the ED until their evaluation is complete.     Sherrill Raring, PA-C 05/29/22 1340

## 2022-05-29 NOTE — H&P (Signed)
History and Physical    Amanda Butler [id number] DOB: [birth year] DOA: 05/29/2022  PCP: Gaynelle Arabian, MD  Patient coming from: Home  I have personally briefly reviewed patient's old medical records in [place]  Chief Complaint: Shortness of breath  HPI: Amanda Butler is a 67 y.o. female with medical history significant of hypertension, gastroesophageal reflux disease, morbid obesity, type 2 diabetes, chronic diastolic CHF, chronic venous stasis changes bilateral lower extremities presenting to the ED with a 3-day history of worsening shortness of breath from baseline, worsening lower extremity edema, dysuria, worsening urgency and worsening frequency, worsening right upper and right lower extremity weakness with some numbness.  Patient although states has chronic sciatica.  Patient also states has some chronic numbness in her legs.  Patient does endorse some nausea, 1 bout of soft mushy stool 1 day prior to admission.  Patient denies any fevers, no chills, no abdominal pain, no melena, no hematemesis, no hematochezia.  Patient does complain of a bout of a spinning sensation 1 day prior to admission.  Patient however also does state does have a history of chronic dizziness and some lightheadedness.  Patient did state had some difficulty ambulating with her walker and felt if she did not use a walker she would lose her balance.  Patient with no significant change with vaginal abnormalities.  No speech impediments.  Patient also does endorse systolic blood pressures in the 170s at home despite being compliant with her antihypertensives.  ED Course: Patient seen in the ED on presentation noted to have blood pressure of 221/107.  Also noted with a heart rate of 105.  Per ED physician when speaking with patient patient's O2 sats dropped in the 80s and patient placed on 2 L nasal cannula.  Comprehensive metabolic profile noted a glucose of 371, albumin of 3.4 otherwise within normal limits.   BNP noted at 81.3.  High-sensitivity troponin noted at 10.  CBC unremarkable.  Influenza A and B PCR negative.  RSV PCR negative.  SARS coronavirus 2 PCR negative.  Urinalysis with greater than 500 glucose, moderate hemoglobin, trace leukocytes, 5 ketones, nitrite negative, many bacteria, WBC > 50.  Urine cultures not obtained.  EKG with right bundle blanch block with no significant change from prior EKG. -CT head with no acute abnormalities. -MRI offered however patient refused due to claustrophobia. During my assessment patient noted to have sats consistently greater than 92% on room air.  Review of Systems: As per HPI otherwise all other systems reviewed and are negative.  Past Medical History:  Diagnosis Date   Arthritis    "knees" (09/13/2015)   Asthma    Cellulitis 11/17/2018   LEFT LEG    Chronic diastolic CHF (congestive heart failure) ([place])    a. 09/2015 Echo: EF 55-60%, mild LVH, gr2DD, no rwma, mild MR, mildly dil LA.    GERD (gastroesophageal reflux disease)    High cholesterol    Hypertension    Midsternal chest pain    a. 09/2015 MV: no ischemia/infarct, EF 61%; b. 09/2015 Low prob V:Q scan.   Morbid obesity ([place])    Pneumonia ~ 2000   "mild case"   RBBB (right bundle branch block) approx 2010   Dr Marisue Humble did ECG and told her   Type II diabetes mellitus ([place])     Past Surgical History:  Procedure Laterality Date   BUNIONECTOMY Left    COLONOSCOPY WITH PROPOFOL N/A 09/23/2016   Procedure: COLONOSCOPY WITH PROPOFOL;  Surgeon: Earle Gell  K, MD;  Location: [facility];  Service: Endoscopy;  Laterality: N/A;   MULTIPLE TOOTH EXTRACTIONS  ~ [place] Right    Leg   TUBAL LIGATION  ~ 1983    Social History  reports that she has never smoked. She has never used smokeless tobacco. She reports that she does not drink alcohol and does not use drugs.  Allergies  Allergen Reactions   Aspirin Hives and Shortness Of Breath   Bee Venom Anaphylaxis   Ibuprofen  Anaphylaxis and Swelling    Swelling of throat   Iodine Shortness Of Breath and Rash   Metformin And Related Swelling and Other (See Comments)    Patient reports flu-like symptoms and fatigue Swelling of throat   Nsaids Anaphylaxis, Hives, Swelling and Other (See Comments)    Swelling of throat- Tylenol is tolerated   [place] Shortness Of Breath, Itching, Rash and Other (See Comments)    Turnip greens, collard greens, pickles, and squash   Other Shortness Of Breath and Other (See Comments)    Reaction to "X-ray dye"   Shellfish Allergy Anaphylaxis   Actos [Pioglitazone] Other (See Comments)    Erratic heartbeat   Celecoxib Other (See Comments)    Unknown reaction   Latex Hives   Morphine And Related Other (See Comments)    Childhood allergy (66 years old) , pt got sicker , temp went up, dr gave her the wrong medication   Ciprofloxacin Palpitations    Joint pain    Family History  Problem Relation Age of Onset   Cancer Mother    Diabetes Mother    Hypertension Mother    Diabetes Father    Hypertension Father    Heart attack Father 70   Diabetes Maternal Grandmother    Hypertension Maternal Grandmother    Cancer Maternal Grandfather    Diabetes Maternal Grandfather    Hypertension Maternal Grandfather    Asthma Paternal Grandmother    Diabetes Paternal Grandmother    Hypertension Paternal Grandmother    Diabetes Paternal Grandfather    Hypertension Paternal Grandfather    Heart failure Paternal Grandfather    Diabetes Maternal Aunt    Mother deceased age 45 from lung cancer per patient, father deceased age 44 from an acute MI.  Prior to Admission medications   Medication Sig Start Date End Date Taking? Authorizing Provider  acetaminophen (TYLENOL) 500 MG tablet Take 1,000 mg by mouth 2 (two) times daily as needed (pain).    [provider]  albuterol (PROVENTIL) (2.5 MG/3ML) 0.083% nebulizer solution Take 2.5 mg by nebulization See admin instructions. Take 2.5 mg  by mouth 1-2 times a day as needed for wheezing and SOB    [provider]  albuterol (VENTOLIN HFA) 108 (90 Base) MCG/ACT inhaler Inhale 2 puffs into the lungs 2 (two) times daily as needed for shortness of breath or wheezing. 01/14/21   [provider]  carvedilol (COREG) 6.25 MG tablet Take 1 tablet (6.25 mg total) by mouth 2 (two) times daily with a meal. 10/16/21   Mercy Riding, MD  fenofibrate 54 MG tablet Take 1 tablet (54 mg total) by mouth daily. Patient taking differently: Take 54 mg by mouth every morning. 01/24/21 05/29/22  Mariel Aloe, MD  furosemide (LASIX) 40 MG tablet Take 1 tablet (40 mg total) by mouth daily. 10/16/21 05/29/22  Mercy Riding, MD  glimepiride (AMARYL) 4 MG tablet Take 1 tablet (4 mg total) by mouth every morning. 11/19/18  05/29/22  Black, Lezlie Octave, NP  lisinopril (ZESTRIL) 10 MG tablet Take 10 mg by mouth daily. 08/06/21   [provider]  Mupirocin (BACTROBAN EX) Apply 1 Application topically daily.    [provider]  Oxymetazoline HCl (NASAL SPRAY NA) Place 2 sprays into the nose 2 (two) times daily as needed (asthma, runny nose).    [provider]  repaglinide (PRANDIN) 1 MG tablet Take 1 mg by mouth daily. 08/19/18   [provider]  rosuvastatin (CRESTOR) 20 MG tablet Take 20 mg by mouth every morning.    [provider]  Skin Protectants, Misc. (EUCERIN) cream Apply 1 application  topically daily as needed for dry skin.    [provider]    Physical Exam: Vitals:   05/29/22 1600 05/29/22 1630 05/29/22 1644 05/29/22 1728  BP: (!) 193/87 (!) 166/128    Pulse: 79 77    Resp:  18 18   Temp:    97.6 F (36.4 C)  TempSrc:    Oral  SpO2: 100% 100%    Weight:        Constitutional: NAD, calm, comfortable Vitals:   05/29/22 1600 05/29/22 1630 05/29/22 1644 05/29/22 1728  BP: (!) 193/87 (!) 166/128    Pulse: 79 77    Resp:  18 18   Temp:    97.6 F (36.4 C)  TempSrc:    Oral  SpO2:  100% 100%    Weight:       Eyes: PERRL, lids and conjunctivae normal ENMT: Mucous membranes are moist. Posterior pharynx clear of any exudate or lesions.Normal dentition.  Neck: normal, supple, no masses, no thyromegaly Respiratory: clear to auscultation bilaterally, no wheezing, no crackles. Normal respiratory effort. No accessory muscle use.  Cardiovascular: Regular rate and rhythm, no murmurs / rubs / gallops.  1-2+ bilateral lower extremity edema.   Abdomen: no tenderness, no masses palpated. No hepatosplenomegaly. Bowel sounds positive.  Musculoskeletal: no clubbing / cyanosis. No joint deformity upper and lower extremities. Good ROM, no contractures. Normal muscle tone.  Skin: Bilateral lower extremity chronic venous stasis changes noted.  Skin tears noted on bilateral lower extremities.  No significant changes of cellulitis noted.  N Neurologic: CN 2-12 grossly intact. Sensation intact, DTR normal. Strength 5/5 in all bilateral upper extremities and bilateral lower extremities.Marland Kitchen  Psychiatric: Normal judgment and insight. Alert and oriented x 3. Normal mood.   Labs on Admission: I have personally reviewed following labs and imaging studies  CBC: Recent Labs  Lab 05/29/22 1353  WBC 6.2  NEUTROABS 5.0  HGB 13.8  HCT 42.5  MCV 85.5  PLT 123456    Basic Metabolic Panel: Recent Labs  Lab 05/29/22 1353  NA 140  K 4.0  CL 104  CO2 23  GLUCOSE 371*  BUN 13  CREATININE 0.91  CALCIUM 8.9    GFR: Estimated Creatinine Clearance: 70.2 mL/min (by C-G formula based on SCr of 0.91 mg/dL).  Liver Function Tests: Recent Labs  Lab 05/29/22 1353  AST 15  ALT 10  ALKPHOS 85  BILITOT 0.8  PROT 8.1  ALBUMIN 3.4*    Urine analysis:    Component Value Date/Time   COLORURINE YELLOW 05/29/2022 1405   APPEARANCEUR HAZY (A) 05/29/2022 1405   LABSPEC 1.028 05/29/2022 1405   PHURINE 6.0 05/29/2022 1405   GLUCOSEU >=500 (A) 05/29/2022 1405   HGBUR MODERATE (A) 05/29/2022 1405    BILIRUBINUR NEGATIVE 05/29/2022 1405   KETONESUR 5 (A) 05/29/2022 1405  PROTEINUR 30 (A) 05/29/2022 1405   NITRITE NEGATIVE 05/29/2022 1405   LEUKOCYTESUR TRACE (A) 05/29/2022 1405    Radiological Exams on Admission: CT Head Wo Contrast  Result Date: 05/29/2022 CLINICAL DATA:  Headache EXAM: CT HEAD WITHOUT CONTRAST TECHNIQUE: Contiguous axial images were obtained from the base of the skull through the vertex without intravenous contrast. RADIATION DOSE REDUCTION: This exam was performed according to the departmental dose-optimization program which includes automated exposure control, adjustment of the mA and/or kV according to patient size and/or use of iterative reconstruction technique. COMPARISON:  CT head 02/24/2022 FINDINGS: Brain: No evidence of acute infarction, hemorrhage, hydrocephalus, extra-axial collection or mass lesion/mass effect. Vascular: No hyperdense vessel or unexpected calcification. Skull: Normal. Negative for fracture or focal lesion. Sinuses/Orbits: No acute finding. Other: None. IMPRESSION: No acute intracranial abnormality. Electronically Signed   By: Ronney Asters M.D.   On: 05/29/2022 15:49   DG Chest 2 View  Result Date: 05/29/2022 CLINICAL DATA:  Shortness of breath EXAM: CHEST - 2 VIEW COMPARISON:  12/29/2021 FINDINGS: The heart size and mediastinal contours are within normal limits. Both lungs are clear. The visualized skeletal structures are unremarkable. Mild degenerative changes along the spine. Degenerative changes of the shoulders. IMPRESSION: No acute cardiopulmonary disease Electronically Signed   By: Jill Side M.D.   On: 05/29/2022 13:50    EKG: Independently reviewed.  Right bundle branch block, LVH, tachycardia heart rate 101.  Assessment/Plan Principal Problem:   Hypertensive urgency Active Problems:   Bilateral lower extremity edema   Diabetes mellitus type 2, diet-controlled (HCC)   Stasis dermatitis of both legs   Asthma   GERD  (gastroesophageal reflux disease)   Morbid obesity due to excess calories (HCC)   High cholesterol   Chronic diastolic CHF (congestive heart failure) ([facility])   Chronic right-sided low back pain with right-sided sciatica   Ulcers of both lower extremities, limited to breakdown of skin (HCC)   Postural dizziness with presyncope   Urinary tract infection without hematuria    #1 hypertensive urgency -Patient presented with a 3-day history of worsening shortness of breath, worsening lower extremity edema, some right-sided weakness worse from baseline, worsening lower extremity edema, an episode of lightheadedness/spinning sensation. -Patient noted in the ED on presentation to have a blood pressure of 221/107. -Patient endorses compliance with medications. -Patient given labetalol with some improvement with blood pressure. -Placed back on home regimen of Coreg 6.25 mg twice daily, home regimen Lasix 40 mg daily. -Increase patient's lisinopril to 20 mg daily. -IV hydralazine as needed.  2.  GERD -PPI.  3.  Chronic diastolic CHF -Patient with chronic lower extremity edema, with chronic venous stasis changes. -Patient not hypoxic with sats greater than 92% on room air. -Respiratory exam without any crackles.  Chest x-ray negative for any acute infiltrate. -BNP within normal limits. -Patient likely euvolemic at baseline. -Resume home regimen Coreg and Lasix.  #4.  Right-sided weakness/numbness -Patient with some complaints of slightly worsening chronic right-sided weakness and numbness. -Patient does endorse history of sciatica and chronic numbness in her feet. -Likely secondary to hypertensive urgency versus possible UTI. -Head CT done negative for any acute abnormalities. -Patient currently refusing MRI brain due to claustrophobia and states needs sedation. -Patient with no focal neurological deficits noted on physical exam. -Placed on empiric IV Rocephin to treat UTI pending urine  cultures. -Control hypertensive urgency. -PT/OT. -Reassess in the next 24 to 48 hours if patient still with ongoing symptoms we will repeat head CT 48  hours from initial head CT as patient currently refusing MRI brain.  5.  UTI -Patient with symptoms of dysuria, worsening urgency, worsening frequency. -Urinalysis concerning for UTI. -Check urine culture. -IV Rocephin.  6.  Diabetes is mellitus type II -Check a hemoglobin A1c. -Hold oral hypoglycemic agents. -SSI.  7.  Hyperlipidemia -Resume home regimen statin.  8.  Chronic bilateral lower extremity edema/chronic venous stasis changes/chronic ulcers bilateral lower extremity limited with skin breakdown -WOC Consult  9.  Obesity -Patient states has not had 100 pound weight loss over the past 2 years. -Patient commended on weight loss. -Lifestyle modification. -Outpatient follow-up with PCP.  DVT prophylaxis: Lovenox Code Status:   DNR Family Communication:  Updated patient.  No family at bedside. Disposition Plan:   Patient is from:  Home  Anticipated DC to:  Likely home with home health.  Anticipated DC date:  2 to 3 days.  Anticipated DC barriers: Clinical improvement.  Consults called:  None Admission status:  Place in observation/telemetry.  Severity of Illness: The appropriate patient status for this patient is OBSERVATION. Observation status is judged to be reasonable and necessary in order to provide the required intensity of service to ensure the patient's safety. The patient's presenting symptoms, physical exam findings, and initial radiographic and laboratory data in the context of their medical condition is felt to place them at decreased risk for further clinical deterioration. Furthermore, it is anticipated that the patient will be medically stable for discharge from the hospital within 2 midnights of admission.     Irine Seal MD [facility]  How to contact the [facility] Attending or Consulting  provider [place] or covering provider during after hours [place], for this patient?   Check the care team in [facility] and look for a) attending/consulting [facility] provider listed and b) the [facility] team listed Log into [email or web address] and use [place]'s universal password to access. If you do not have the password, please contact the hospital operator. Locate the [facility] provider you are looking for under [facility] and page to a number that you can be directly reached. If you still have difficulty reaching the provider, please page the [facility] (Director on Call) for the Hospitalists listed on amion for assistance.  05/29/2022, 6:58 PM

## 2022-05-29 NOTE — ED Notes (Signed)
Pt's daughter called requesting pt get an MRI but also reports pt needs to be completely sedated for an MRI to be done.  Discussed condition and updated daughter with permission from patient.

## 2022-05-29 NOTE — ED Notes (Signed)
Cleaned pt up changed linen

## 2022-05-29 NOTE — ED Notes (Signed)
Pt ambulated to room in NAD.  Speaking in full sentences.  No complaints of pain at this time.

## 2022-05-29 NOTE — ED Triage Notes (Addendum)
C/o sob, lightheaded, headache, sore throat, and "congestion with trouble swallowing due to not being able to breath" x1 week.  Talking in full sentences w/o distress Left leg weeping and bleeding  Hx CHF.  Bilateral lower extremity swelling noted.

## 2022-05-29 NOTE — ED Notes (Signed)
Gave pt a [place] sandwich and diet cola, pt sitting at the side of the bed

## 2022-05-29 NOTE — ED Notes (Signed)
Placed on 2L [place] on verbal from Dr. [name]

## 2022-05-29 NOTE — ED Provider Notes (Signed)
[facility] Provider Note   CSN: [id number] Arrival date & time: 05/29/22  1318     History  Chief Complaint  Patient presents with   Shortness of Breath    Amanda Butler is a 66 y.o. female.  The history is provided by the patient and medical records. No language interpreter was used.  Shortness of Breath Severity:  Severe Onset quality:  Gradual Timing:  Constant Progression:  Unchanged Context: not URI   Relieved by:  Nothing Worsened by:  Exertion Ineffective treatments:  None tried Associated symptoms: headaches   Associated symptoms: no abdominal pain, no chest pain (tightness reported), no cough, no fever, no neck pain, no sputum production, no vomiting and no wheezing   Risk factors: obesity   Risk factors: no hx of PE/DVT        Home Medications Prior to Admission medications   Medication Sig Start Date End Date Taking? Authorizing Provider  acetaminophen (TYLENOL) 500 MG tablet Take 1,000 mg by mouth 2 (two) times daily as needed (pain).    [provider]  albuterol (PROVENTIL) (2.5 MG/3ML) 0.083% nebulizer solution Take 2.5 mg by nebulization See admin instructions. Take 2.5 mg by mouth 1-2 times a day as needed for wheezing and SOB    [provider]  albuterol (VENTOLIN HFA) 108 (90 Base) MCG/ACT inhaler Inhale 2 puffs into the lungs 2 (two) times daily as needed for shortness of breath or wheezing. 01/14/21   [provider]  carvedilol (COREG) 6.25 MG tablet Take 1 tablet (6.25 mg total) by mouth 2 (two) times daily with a meal. 10/16/21   Mercy Riding, MD  fenofibrate 54 MG tablet Take 1 tablet (54 mg total) by mouth daily. Patient taking differently: Take 54 mg by mouth every morning. 01/24/21 10/15/21  Mariel Aloe, MD  furosemide (LASIX) 40 MG tablet Take 1 tablet (40 mg total) by mouth daily. 10/16/21 01/14/22  Mercy Riding, MD  glimepiride (AMARYL) 4 MG tablet Take 1 tablet (4 mg  total) by mouth every morning. 11/19/18 02/10/22  Radene Gunning, NP  lisinopril (ZESTRIL) 10 MG tablet Take 10 mg by mouth daily. 08/06/21   [provider]  Mupirocin (BACTROBAN EX) Apply 1 Application topically daily.    [provider]  Oxymetazoline HCl (NASAL SPRAY NA) Place 2 sprays into the nose 2 (two) times daily as needed (asthma, runny nose).    [provider]  repaglinide (PRANDIN) 1 MG tablet Take 1 mg by mouth daily. 08/19/18   [provider]  rosuvastatin (CRESTOR) 20 MG tablet Take 20 mg by mouth every morning.    [provider]  Skin Protectants, Misc. (EUCERIN) cream Apply 1 application topically daily as needed for dry skin. Patient not taking: Reported on 10/15/2021    [provider]      Allergies    Bee venom, Iodine, Metformin and related, Other, Shellfish allergy, Actos [pioglitazone], Aspirin, Celecoxib, Ibuprofen, Latex, Morphine and related, Nsaids, Okra, and Ciprofloxacin    Review of Systems   Review of Systems  Constitutional:  Positive for fatigue. Negative for chills and fever.  HENT:  Negative for congestion.   Eyes:  Positive for visual disturbance.  Respiratory:  Positive for chest tightness and shortness of breath. Negative for cough, sputum production, choking and wheezing.   Cardiovascular:  Positive for leg swelling. Negative for chest pain (tightness reported) and palpitations.  Gastrointestinal:  Positive for diarrhea and  nausea. Negative for abdominal pain, constipation and vomiting.  Genitourinary:  Positive for frequency and urgency. Negative for decreased urine volume, vaginal bleeding, vaginal discharge and vaginal pain.  Musculoskeletal:  Negative for back pain, neck pain and neck stiffness.  Skin:  Negative for color change.  Neurological:  Positive for dizziness, weakness, numbness and headaches. Negative for facial asymmetry, speech difficulty and light-headedness.  Psychiatric/Behavioral:   Negative for agitation and confusion.   All other systems reviewed and are negative.   Physical Exam Updated Vital Signs BP (!) 221/107 (BP Location: Left Arm)   Pulse (!) 105   Temp 98.7 F (37.1 C) (Oral)   Resp 18   Wt 99 kg   SpO2 98%   BMI 35.23 kg/m  Physical Exam Vitals and nursing note reviewed.  Constitutional:      General: She is not in acute distress.    Appearance: She is well-developed. She is not ill-appearing, toxic-appearing or diaphoretic.  HENT:     Head: Normocephalic and atraumatic.  Eyes:     Extraocular Movements: Extraocular movements intact.     Conjunctiva/sclera: Conjunctivae normal.     Pupils: Pupils are equal, round, and reactive to light.  Cardiovascular:     Rate and Rhythm: Normal rate and regular rhythm.     Heart sounds: No murmur heard. Pulmonary:     Effort: Pulmonary effort is normal. Tachypnea present. No respiratory distress.     Breath sounds: Normal breath sounds. No decreased breath sounds, wheezing, rhonchi or rales.  Chest:     Chest wall: No tenderness.  Abdominal:     Palpations: Abdomen is soft.     Tenderness: There is no abdominal tenderness.  Musculoskeletal:        General: No swelling.     Cervical back: Neck supple.     Right lower leg: Edema present.     Left lower leg: Edema present.  Skin:    General: Skin is warm and dry.     Capillary Refill: Capillary refill takes less than 2 seconds.     Coloration: Skin is not pale.     Findings: No erythema.     Nails: There is no clubbing.  Neurological:     General: No focal deficit present.     Mental Status: She is alert.     Cranial Nerves: No dysarthria or facial asymmetry.     Sensory: Sensory deficit present.     Motor: No tremor.     Comments: Numbness in right arm and right leg compared to the left mildly.  Subtle grip strength decreased on right compared to left.  Patient is right-handed.  Also subtle leg strength raise decreased in right compared to left.   Symmetric smile.  Clear speech.  Pupils symmetric and reactive, extract movements.  Psychiatric:        Mood and Affect: Mood normal.     ED Results / Procedures / Treatments   Labs (all labs ordered are listed, but only abnormal results are displayed) Labs Reviewed  COMPREHENSIVE METABOLIC PANEL - Abnormal; Notable for the following components:      Result Value   Glucose, Bld 371 (*)    Albumin 3.4 (*)    All other components within normal limits  URINALYSIS, ROUTINE W REFLEX MICROSCOPIC - Abnormal; Notable for the following components:   APPearance HAZY (*)    Glucose, UA >=500 (*)    Hgb urine dipstick MODERATE (*)    Ketones, ur 5 (*)  Protein, ur 30 (*)    Leukocytes,Ua TRACE (*)    Bacteria, UA MANY (*)    All other components within normal limits  CBG MONITORING, ED - Abnormal; Notable for the following components:   Glucose-Capillary 252 (*)    All other components within normal limits  RESP PANEL BY RT-PCR (RSV, FLU A&B, COVID)  RVPGX2  URINE CULTURE  CBC WITH DIFFERENTIAL/PLATELET  BRAIN NATRIURETIC PEPTIDE  HIV ANTIBODY (ROUTINE TESTING W REFLEX)  MAGNESIUM  COMPREHENSIVE METABOLIC PANEL  CBC  HEMOGLOBIN A1C  TROPONIN I (HIGH SENSITIVITY)  TROPONIN I (HIGH SENSITIVITY)    EKG EKG Interpretation  Date/Time:  Thursday May 29 2022 13:33:28 EST Ventricular Rate:  101 PR Interval:  205 QRS Duration: 122 QT Interval:  357 QTC Calculation: 463 R Axis:   20 Text Interpretation: Sinus tachycardia Consider left atrial enlargement Right bundle branch block Left ventricular hypertrophy Baseline wander in lead(s) I II aVR V3 V6 when compared to prior, overall similar appearance. No STEMI Confirmed by Antony Blackbird [phone]) on 05/29/2022 4:11:20 PM  Radiology CT Head Wo Contrast  Result Date: 05/29/2022 CLINICAL DATA:  Headache EXAM: CT HEAD WITHOUT CONTRAST TECHNIQUE: Contiguous axial images were obtained from the base of the skull through the vertex  without intravenous contrast. RADIATION DOSE REDUCTION: This exam was performed according to the departmental dose-optimization program which includes automated exposure control, adjustment of the mA and/or kV according to patient size and/or use of iterative reconstruction technique. COMPARISON:  CT head 02/24/2022 FINDINGS: Brain: No evidence of acute infarction, hemorrhage, hydrocephalus, extra-axial collection or mass lesion/mass effect. Vascular: No hyperdense vessel or unexpected calcification. Skull: Normal. Negative for fracture or focal lesion. Sinuses/Orbits: No acute finding. Other: None. IMPRESSION: No acute intracranial abnormality. Electronically Signed   By: Ronney Asters M.D.   On: 05/29/2022 15:49   DG Chest 2 View  Result Date: 05/29/2022 CLINICAL DATA:  Shortness of breath EXAM: CHEST - 2 VIEW COMPARISON:  12/29/2021 FINDINGS: The heart size and mediastinal contours are within normal limits. Both lungs are clear. The visualized skeletal structures are unremarkable. Mild degenerative changes along the spine. Degenerative changes of the shoulders. IMPRESSION: No acute cardiopulmonary disease Electronically Signed   By: Jill Side M.D.   On: 05/29/2022 13:50    Procedures Procedures    CRITICAL CARE Performed by: Gwenyth Allegra Ansley [place] Total critical care time: 35 minutes Critical care time was exclusive of separately billable procedures and treating other patients. Critical care was necessary to treat or prevent imminent or life-threatening deterioration. Critical care was time spent personally by me on the following activities: development of treatment plan with patient and/or surrogate as well as nursing, discussions with consultants, evaluation of patient's response to treatment, examination of patient, obtaining history from patient or surrogate, ordering and performing treatments and interventions, ordering and review of laboratory studies, ordering and review of radiographic  studies, pulse oximetry and re-evaluation of patient's condition.  Medications Ordered in ED Medications  carvedilol (COREG) tablet 6.25 mg (6.25 mg Oral Patient Refused/Not Given 05/29/22 2228)  lisinopril (ZESTRIL) tablet 20 mg (20 mg Oral Patient Refused/Not Given 05/29/22 2228)  fenofibrate tablet 54 mg (has no administration in time range)  furosemide (LASIX) tablet 40 mg (has no administration in time range)  rosuvastatin (CRESTOR) tablet 20 mg (has no administration in time range)  albuterol (PROVENTIL) (2.5 MG/3ML) 0.083% nebulizer solution 2.5 mg (has no administration in time range)  hydrocerin (EUCERIN) cream 1 Application (has no administration in time range)  enoxaparin (LOVENOX) injection 40 mg (40 mg Subcutaneous Patient Refused/Not Given 05/29/22 2228)  sodium chloride flush (NS) 0.9 % injection 3 mL (3 mLs Intravenous Not Given 05/29/22 2229)  sodium chloride flush (NS) 0.9 % injection 3 mL (has no administration in time range)  0.9 %  sodium chloride infusion (has no administration in time range)  acetaminophen (TYLENOL) tablet 650 mg (has no administration in time range)    Or  acetaminophen (TYLENOL) suppository 650 mg (has no administration in time range)  oxyCODONE (Oxy IR/ROXICODONE) immediate release tablet 5 mg (has no administration in time range)  polyethylene glycol (MIRALAX / GLYCOLAX) packet 17 g (has no administration in time range)  sorbitol 70 % solution 30 mL (has no administration in time range)  ondansetron (ZOFRAN) tablet 4 mg (has no administration in time range)    Or  ondansetron (ZOFRAN) injection 4 mg (has no administration in time range)  hydrALAZINE (APRESOLINE) injection 10 mg (has no administration in time range)  insulin aspart (novoLOG) injection 0-15 Units (has no administration in time range)  pantoprazole (PROTONIX) EC tablet 40 mg (40 mg Oral Patient Refused/Not Given 05/29/22 2228)  cefTRIAXone (ROCEPHIN) 2 g in sodium chloride 0.9 % 100 mL  IVPB (has no administration in time range)  oxymetazoline (AFRIN) 0.05 % nasal spray 1 spray (has no administration in time range)  labetalol (NORMODYNE) injection 10 mg (10 mg Intravenous Given 05/29/22 1744)  cefTRIAXone (ROCEPHIN) 1 g in sodium chloride 0.9 % 100 mL IVPB (0 g Intravenous Stopped 05/29/22 1920)    ED Course/ Medical Decision Making/ A&P                             Medical Decision Making Risk Prescription drug management. Decision regarding hospitalization.    Amanda Butler is a 67 y.o. female with a complex past medical history including hypertension, diabetes, GERD, hypercholesterolemia, obesity, CHF, previous hypertensive urgency, previous urinary tract infections, peripheral edema, and previous pneumonia who presents with several days of worsening headache, right arm and right leg weakness, intermittent dizziness, mild blurry vision, nausea, fatigue, chest tightness, shortness of breath, and urinary symptoms of urgency and hesitancy and frequency.  According to patient, patient has been having elevated blood pressures on and off for quite some time.  She reports that by taking her home medicines that is not well-controlled.  She was found to blood pressures in the 220s over the last few days and was 221 on arrival here.  She reports she is having moderate to severe headache and difficulties with her right arm and right leg feel weak.  She denies any trauma.  Denies any neck pain or neck stiffness.  Reports some mild blurry vision and photophobia but is also complaining of a spinning dizziness at times.  Denies any speech difficulties for me.  Denies any trauma.  Reports having some shortness of breath and chest tightness and thinks it could related to blood pressure versus fluid overload.  She says her legs are quite edematous similar to prior and they are weeping.  She reports no constipation but does have some intermittent diarrhea.  Denies any sick contacts.  She reports  she has had some urinary urgency, hesitancy, and frequency but denies dysuria.  Denies vaginal discharge or vaginal bleeding.  Patient is very concerned about the shortness of breath and neurologic symptoms that she reports she thinks has had TIAs in the past.  On my exam,  lungs were clear but when patient was taking deep breaths her oxygen saturations did dip into the mid 80s at 86% on room air.  Will place her on some oxygen.  Chest was nontender.  I do not appreciate a murmur.  She does have edema in lower extremities but she had pulses in all extremities.  Patient had subjective weakness in her right arm and right leg as well as some subjective numbness in her right arm and right leg compared to left.  No carotid bruit on my exam.  No evidence of head trauma.  Pupils are symmetric and reactive with normal extract movements.  No tenderness initially.  Given the patient to hypoxia I do suspect she will need admission however patient had workup starting in triage including head CT that showed no bleeding.  Chest x-ray showed no pneumonia.  Her labs were surprisingly overall reassuring for evidence of UTI given urinary symptoms and the leukocytes and bacteria.  Her COVID/flu/RSV negative.  Troponin negative, will trend.  BNP nonelevated.  Metabolic panel and CBC showed hyperglycemia but otherwise no evidence of AKI.  EKG appears similar to prior.  Will place patient on oxygen and give antibiotics for UTI.  Unclear if UTI is raised her blood pressure where is not responding to her home medications.  Will give some labetalol for her and she will get admitted for suspected hypertensive urgency causing shortness of breath, hypoxia, and possibly aggravating some neurologic symptoms.  Patient says that unless she is "knocked out asleep" she will refuse MRI and going into a tube.  As a CT did not show bleed, will defer to admitting team as to if she needs sedation for an MRI.  We offered Ativan and she said this  would not work well enough.  Symptoms are not pleuritic and have low suspicion for a pulmonary embolism as a cause of symptoms.  Will call for admission when blood pressure gets better controlled.         Final Clinical Impression(s) / ED Diagnoses Final diagnoses:  Hypoxia  SOB (shortness of breath)  Elevated blood pressure reading   Clinical Impression: 1. Hypoxia   2. SOB (shortness of breath)   3. Elevated blood pressure reading     Disposition: Admit  This note was prepared with assistance of Dragon voice recognition software. Occasional wrong-word or sound-a-like substitutions may have occurred due to the inherent limitations of voice recognition software.     Merritt Mccravy, Gwenyth Allegra, MD 05/29/22 [phone]

## 2022-05-30 ENCOUNTER — Other Ambulatory Visit (HOSPITAL_COMMUNITY): Payer: Self-pay

## 2022-05-30 DIAGNOSIS — G8929 Other chronic pain: Secondary | ICD-10-CM

## 2022-05-30 DIAGNOSIS — E119 Type 2 diabetes mellitus without complications: Secondary | ICD-10-CM | POA: Diagnosis not present

## 2022-05-30 DIAGNOSIS — R0602 Shortness of breath: Secondary | ICD-10-CM | POA: Diagnosis not present

## 2022-05-30 DIAGNOSIS — M5441 Lumbago with sciatica, right side: Secondary | ICD-10-CM | POA: Diagnosis not present

## 2022-05-30 DIAGNOSIS — I5032 Chronic diastolic (congestive) heart failure: Secondary | ICD-10-CM | POA: Diagnosis not present

## 2022-05-30 DIAGNOSIS — N3000 Acute cystitis without hematuria: Secondary | ICD-10-CM | POA: Diagnosis not present

## 2022-05-30 DIAGNOSIS — E876 Hypokalemia: Secondary | ICD-10-CM | POA: Diagnosis not present

## 2022-05-30 DIAGNOSIS — R600 Localized edema: Secondary | ICD-10-CM | POA: Diagnosis not present

## 2022-05-30 DIAGNOSIS — I160 Hypertensive urgency: Secondary | ICD-10-CM | POA: Diagnosis not present

## 2022-05-30 DIAGNOSIS — E7800 Pure hypercholesterolemia, unspecified: Secondary | ICD-10-CM | POA: Diagnosis not present

## 2022-05-30 DIAGNOSIS — K219 Gastro-esophageal reflux disease without esophagitis: Secondary | ICD-10-CM | POA: Diagnosis not present

## 2022-05-30 LAB — COMPREHENSIVE METABOLIC PANEL WITH GFR
ALT: 10 U/L (ref 0–44)
AST: 11 U/L — ABNORMAL LOW (ref 15–41)
Albumin: 3 g/dL — ABNORMAL LOW (ref 3.5–5.0)
Alkaline Phosphatase: 71 U/L (ref 38–126)
Anion gap: 10 (ref 5–15)
BUN: 9 mg/dL (ref 8–23)
CO2: 26 mmol/L (ref 22–32)
Calcium: 8.7 mg/dL — ABNORMAL LOW (ref 8.9–10.3)
Chloride: 102 mmol/L (ref 98–111)
Creatinine, Ser: 0.64 mg/dL (ref 0.44–1.00)
GFR, Estimated: >60 mL/min (ref >60)
Glucose, Bld: 252 mg/dL — ABNORMAL HIGH (ref 70–99)
Potassium: 3.7 mmol/L (ref 3.5–5.1)
Sodium: 138 mmol/L (ref 135–145)
Total Bilirubin: 1 mg/dL (ref 0.3–1.2)
Total Protein: 6.6 g/dL (ref 6.5–8.1)

## 2022-05-30 LAB — CBC
HCT: 38.5 % (ref 36.0–46.0)
Hemoglobin: 12.7 g/dL (ref 12.0–15.0)
MCH: 28.2 pg (ref 26.0–34.0)
MCHC: 33 g/dL (ref 30.0–36.0)
MCV: 85.4 fL (ref 80.0–100.0)
Platelets: 217 10*3/uL (ref 150–400)
RBC: 4.51 MIL/uL (ref 3.87–5.11)
RDW: 13.4 % (ref 11.5–15.5)
WBC: 7 10*3/uL (ref 4.0–10.5)
nRBC: 0 % (ref 0.0–0.2)

## 2022-05-30 LAB — HEMOGLOBIN A1C
Hgb A1c MFr Bld: 9.8 % — ABNORMAL HIGH (ref 4.8–5.6)
Mean Plasma Glucose: 234.56 mg/dL

## 2022-05-30 LAB — GLUCOSE, CAPILLARY
Glucose-Capillary: 236 mg/dL — ABNORMAL HIGH (ref 70–99)
Glucose-Capillary: 315 mg/dL — ABNORMAL HIGH (ref 70–99)
Glucose-Capillary: 356 mg/dL — ABNORMAL HIGH (ref 70–99)
Glucose-Capillary: 220 mg/dL — ABNORMAL HIGH (ref 70–99)

## 2022-05-30 LAB — MAGNESIUM: Magnesium: 2.1 mg/dL (ref 1.7–2.4)

## 2022-05-30 LAB — MRSA NEXT GEN BY PCR, NASAL: MRSA by PCR Next Gen: DETECTED — AB

## 2022-05-30 LAB — HIV ANTIBODY (ROUTINE TESTING W REFLEX): HIV Screen 4th Generation wRfx: NONREACTIVE

## 2022-05-30 LAB — TROPONIN I (HIGH SENSITIVITY): Troponin I (High Sensitivity): 11 ng/L (ref <18)

## 2022-05-30 MED ORDER — INSULIN GLARGINE-YFGN 100 UNIT/ML ~~LOC~~ SOLN
10.0000 [IU] | Freq: Every day | SUBCUTANEOUS | Status: DC
  Administered 2022-05-30: 10 [IU] via SUBCUTANEOUS
  Filled 2022-05-30: qty 0.1

## 2022-05-30 MED ORDER — ALUM & MAG HYDROXIDE-SIMETH 200-200-20 MG/5ML PO SUSP
30.0000 mL | Freq: Four times a day (QID) | ORAL | Status: DC | PRN
  Administered 2022-05-30: 30 mL via ORAL
  Filled 2022-05-30: qty 30

## 2022-05-30 MED ORDER — INSULIN ASPART 100 UNIT/ML IJ SOLN
4.0000 [IU] | Freq: Three times a day (TID) | INTRAMUSCULAR | Status: DC
  Administered 2022-05-31 – 2022-06-01 (×4): 4 [IU] via SUBCUTANEOUS

## 2022-05-30 MED ORDER — LIVING WELL WITH DIABETES BOOK
Freq: Once | Status: AC
  Filled 2022-05-30: qty 1

## 2022-05-30 MED ORDER — INSULIN GLARGINE-YFGN 100 UNIT/ML ~~LOC~~ SOLN
15.0000 [IU] | Freq: Every day | SUBCUTANEOUS | Status: DC
  Administered 2022-05-31: 15 [IU] via SUBCUTANEOUS
  Filled 2022-05-30 (×2): qty 0.15

## 2022-05-30 MED ORDER — INSULIN GLARGINE-YFGN 100 UNIT/ML ~~LOC~~ SOLN
5.0000 [IU] | Freq: Once | SUBCUTANEOUS | Status: AC
  Administered 2022-05-30: 5 [IU] via SUBCUTANEOUS
  Filled 2022-05-30: qty 0.05

## 2022-05-30 MED ORDER — LISINOPRIL 20 MG PO TABS
40.0000 mg | ORAL_TABLET | Freq: Every day | ORAL | Status: DC
  Administered 2022-05-31 – 2022-06-02 (×3): 40 mg via ORAL
  Filled 2022-05-30 (×3): qty 2

## 2022-05-30 MED ORDER — ORAL CARE MOUTH RINSE: 15.0000 mL | OROMUCOSAL | Status: DC | PRN

## 2022-05-30 NOTE — Consult Note (Addendum)
WOC Nurse Consult Note: Consult requested for bilat leg chronic wounds. Performed remotely after review of the progress notes.  Pt is familiar to [facility] team from previous admission. She has chronic edema, and patchy areas of partial thickness stasis ulcers in the past.  Dressing procedure/placement/frequency: Topical treatment orders provided for bedside nurses to perform as follows to promote healing: Apply xeroform gauze to bilat legs Q day, then cover with ABD pads and kerlex. Please re-consult if further assistance is needed.  Thank-you,  Julien Girt MSN, [place], [place], [place], [place]

## 2022-05-30 NOTE — TOC Benefit Eligibility Note (Signed)
Patient Teacher, English as a foreign language completed.    The patient is currently admitted and upon discharge could be taking Januvia 100 mg .  The current 30 day co-pay is $47.00.   The patient is insured through [place], [place] Patient Advocate Specialist [facility] Patient Advocate Team Direct Number: [phone]  Fax: [phone]

## 2022-05-30 NOTE — Evaluation (Signed)
Occupational Therapy Evaluation Patient Details Name: Amanda Butler MRN: [id number] DOB: 23-May-1955 Today's Date: 05/30/2022   History of Present Illness 67 y.o. female with medical history significant of hypertension, gastroesophageal reflux disease, morbid obesity, type 2 diabetes, chronic diastolic CHF, chronic venous stasis changes bilateral lower extremities presenting to the ED with a 3-day history of worsening shortness of breath from baseline, worsening lower extremity edema, dysuria, worsening urgency and worsening frequency, worsening right upper and right lower extremity weakness with some numbness. Dx of hypertensive urgency (BP 221/107 in ED)   Clinical Impression   Amanda Butler is a 67 year old woman who demonstrates baseline abilities to perform transfers, ambulation with walker and Adls. Has no overt neurological symptoms and upper extremities grossly symmetrical. She reports her right arm feels weaker but it is near her normal. Patient reports no difficulties at home and goes into detail on how she performs tasks. Patient has no OT needs.       Recommendations for follow up therapy are one component of a multi-disciplinary discharge planning process, led by the attending physician.  Recommendations may be updated based on patient status, additional functional criteria and insurance authorization.   Follow Up Recommendations  No OT follow up     Assistance Recommended at Discharge None  Patient can return home with the following      Functional Status Assessment  Patient has not had a recent decline in their functional status  Equipment Recommendations  None recommended by OT    Recommendations for Other Services       Precautions / Restrictions Precautions Precautions: Other (comment) Precaution Comments: denies falls in past 6 months; open wounds BLEs Restrictions Weight Bearing Restrictions: No      Mobility Bed Mobility Overal bed mobility: Modified  Independent                  Transfers Overall transfer level: Modified independent Equipment used: Rolling walker (2 wheels)                      Balance Overall balance assessment: Mild deficits observed, not formally tested                                         ADL either performed or assessed with clinical judgement   ADL Overall ADL's : Independent                                             Vision Baseline Vision/History: 1 Wears glasses Patient Visual Report: No change from baseline       Perception     Praxis      Pertinent Vitals/Pain Pain Assessment Pain Assessment: No/denies pain     Hand Dominance Right   Extremity/Trunk Assessment Upper Extremity Assessment Upper Extremity Assessment: RUE deficits/detail;LUE deficits/detail RUE Deficits / Details: WFL ROM, grossly 4+/5 strength, numbness in fingers at baseline - able to perfomr finger to thumb and finger to nose - reports right arm feels weaker but no signficant difference noted on testing RUE Coordination: decreased fine motor LUE Deficits / Details: WFL ROM, grossly 4+/5 strength, numbness in fingers at baseline - able to perfomr finger to thumb and finger to nose LUE Coordination: decreased fine motor  Lower Extremity Assessment Lower Extremity Assessment: Defer to PT evaluation RLE Sensation: history of peripheral neuropathy;decreased light touch LLE Deficits / Details: cracked skin on heel, actively bleeding, RN notified; knee ext 5/5 LLE Sensation: history of peripheral neuropathy;decreased light touch   Cervical / Trunk Assessment Cervical / Trunk Assessment: Kyphotic (pannus)   Communication Communication Communication: No difficulties   Cognition Arousal/Alertness: Awake/alert Behavior During Therapy: WFL for tasks assessed/performed Overall Cognitive Status: Within Functional Limits for tasks assessed                                        General Comments  reliant on walker. Has bleednig wound on LLE. Wounds to bilateral lower legs at baseline.    Exercises     Shoulder Instructions      Home Living Family/patient expects to be discharged to:: Private residence Living Arrangements: Alone   Type of Home: House Home Access: Ramped entrance     Home Layout: One level     Bathroom Shower/Tub: Occupational psychologist: Standard     Home Equipment: Conservation officer, nature (2 wheels);Cane - single point;BSC/3in1;Grab bars - tub/shower;Shower seat          Prior Functioning/Environment Prior Level of Function : Independent/Modified Independent;Driving             Mobility Comments: cane at home, RW when going out ADLs Comments: mod I        OT Problem List:        OT Treatment/Interventions:      OT Goals(Current goals can be found in the care plan section) Acute Rehab OT Goals OT Goal Formulation: All assessment and education complete, DC therapy  OT Frequency:      Co-evaluation              AM-PAC OT "6 Clicks" Daily Activity     Outcome Measure Help from another person eating meals?: None Help from another person taking care of personal grooming?: None Help from another person toileting, which includes using toliet, bedpan, or urinal?: None Help from another person bathing (including washing, rinsing, drying)?: None Help from another person to put on and taking off regular upper body clothing?: None Help from another person to put on and taking off regular lower body clothing?: None 6 Click Score: 24   End of Session Equipment Utilized During Treatment: Rolling walker (2 wheels);Gait belt Nurse Communication: Mobility status  Activity Tolerance: Patient tolerated treatment well Patient left: in chair;with chair alarm set  OT Visit Diagnosis: Muscle weakness (generalized) (M62.81)                Time: [id number] OT Time Calculation (min): 34 min Charges:   OT General Charges $OT Visit: 1 Visit OT Evaluation $OT Eval Low Complexity: 1 Low  Gustavo Lah, OTR/L [place]  Office [phone]   Lenward Chancellor 05/30/2022, 10:40 AM

## 2022-05-30 NOTE — TOC Progression Note (Signed)
Transition of Care [facility]) - Progression Note    Patient Details  Name: Amanda Butler MRN: [id number] Date of Birth: [birth year]  Transition of Care [facility]) CM/SW [place], RN Phone [phone]  05/30/2022, 4:15 PM  Clinical Narrative:     Transition of Care [facility]) Screening Note   Patient Details  Name: Amanda Butler Date of Birth: December 18, 1955   Transition of Care [facility]) CM/SW Contact:    Angelita Ingles, RN Phone Number: 05/30/2022, 4:15 PM    Transition of Care Department [facility]) has reviewed patient and no TOC needs have been identified at this time. We will continue to monitor patient advancement through interdisciplinary progression rounds. If new patient transition needs arise, please place a TOC consult.          Expected Discharge Plan and Services                                               Social Determinants of Health (SDOH) Interventions SDOH Screenings   Food Insecurity: Food Insecurity Present (05/30/2022)  Housing: Low Risk  (05/30/2022)  Transportation Needs: No Transportation Needs (05/30/2022)  Utilities: At Risk (05/30/2022)  Depression (PHQ2-9): Low Risk  (02/25/2021)  Tobacco Use: Low Risk  (05/29/2022)    Readmission Risk Interventions     No data to display

## 2022-05-30 NOTE — Evaluation (Signed)
Physical Therapy Evaluation Patient Details Name: Amanda Butler MRN: [id number] DOB: 1956/01/23 Today's Date: 05/30/2022  History of Present Illness  67 y.o. female with medical history significant of hypertension, gastroesophageal reflux disease, morbid obesity, type 2 diabetes, chronic diastolic CHF, chronic venous stasis changes bilateral lower extremities presenting to the ED with a 3-day history of worsening shortness of breath from baseline, worsening lower extremity edema, dysuria, worsening urgency and worsening frequency, worsening right upper and right lower extremity weakness with some numbness. Dx of hypertensive urgency (BP 221/107 in ED)  Clinical Impression  Pt is mobilizing well at a modified independent level, she ambulated 170' with RW, no loss of balance. Pt noted to have bleeding wounds L heel and L posterior distal achilles tendon area (pt reports h/o venous stasis ulcers), also noted skin on panus and upper thighs appears irritated. Pt is mobilizing at her baseline, no further PT indicated. Wound consult recommended.         Recommendations for follow up therapy are one component of a multi-disciplinary discharge planning process, led by the attending physician.  Recommendations may be updated based on patient status, additional functional criteria and insurance authorization.  Follow Up Recommendations        Assistance Recommended at Discharge None  Patient can return home with the following       Equipment Recommendations None recommended by PT  Recommendations for Other Services       Functional Status Assessment Patient has not had a recent decline in their functional status     Precautions / Restrictions Precautions Precautions: Other (comment) Precaution Comments: denies falls in past 6 months; open wounds BLEs Restrictions Weight Bearing Restrictions: No      Mobility  Bed Mobility Overal bed mobility: Modified Independent              General bed mobility comments: HOB up, used rail    Transfers Overall transfer level: Modified independent Equipment used: Rolling walker (2 wheels)                    Ambulation/Gait Ambulation/Gait assistance: Modified independent (Device/Increase time) Gait Distance (Feet): 170 Feet Assistive device: Rolling walker (2 wheels) Gait Pattern/deviations: Step-through pattern, Decreased stride length Gait velocity: WFL     General Gait Details: steady, no loss of balance  Stairs            Wheelchair Mobility    Modified Rankin (Stroke Patients Only)       Balance Overall balance assessment: Modified Independent                                           Pertinent Vitals/Pain Pain Assessment Pain Assessment: No/denies pain    Home Living Family/patient expects to be discharged to:: Private residence Living Arrangements: Alone   Type of Home: House Home Access: Ramped entrance       Home Layout: One level Home Equipment: Conservation officer, nature (2 wheels);Cane - single point;BSC/3in1;Grab bars - tub/shower;Shower seat      Prior Function Prior Level of Function : Independent/Modified Independent;Driving             Mobility Comments: cane at home, RW when going out ADLs Comments: mod I     Hand Dominance   Dominant Hand: Right    Extremity/Trunk Assessment   Upper Extremity Assessment Upper Extremity Assessment: Defer to OT evaluation  Lower Extremity Assessment Lower Extremity Assessment: RLE deficits/detail;LLE deficits/detail RLE Sensation: history of peripheral neuropathy;decreased light touch LLE Deficits / Details: cracked skin on heel, actively bleeding, RN notified; knee ext 5/5 LLE Sensation: history of peripheral neuropathy;decreased light touch    Cervical / Trunk Assessment Cervical / Trunk Assessment: Kyphotic  Communication   Communication: No difficulties  Cognition Arousal/Alertness:  Awake/alert Behavior During Therapy: WFL for tasks assessed/performed Overall Cognitive Status: Within Functional Limits for tasks assessed                                          General Comments      Exercises     Assessment/Plan    PT Assessment Patient does not need any further PT services  PT Problem List         PT Treatment Interventions      PT Goals (Current goals can be found in the Care Plan section)  Acute Rehab PT Goals PT Goal Formulation: All assessment and education complete, DC therapy    Frequency       Co-evaluation               AM-PAC PT "6 Clicks" Mobility  Outcome Measure Help needed turning from your back to your side while in a flat bed without using bedrails?: None Help needed moving from lying on your back to sitting on the side of a flat bed without using bedrails?: None Help needed moving to and from a bed to a chair (including a wheelchair)?: None Help needed standing up from a chair using your arms (e.g., wheelchair or bedside chair)?: None Help needed to walk in hospital room?: None Help needed climbing 3-5 steps with a railing? : None 6 Click Score: 24    End of Session Equipment Utilized During Treatment: Gait belt Activity Tolerance: Patient tolerated treatment well Patient left: in chair;with call bell/phone within reach Nurse Communication: Mobility status      Time: [id number] PT Time Calculation (min) (ACUTE ONLY): 37 min   Charges:   PT Evaluation $PT Eval Moderate Complexity: 1 Mod          Philomena Doheny PT 05/30/2022  Acute Rehabilitation Services  Office [phone]

## 2022-05-30 NOTE — Progress Notes (Signed)
PROGRESS NOTE    Amanda Butler  [id number] DOB: 1955-06-22 DOA: 05/29/2022 PCP: Gaynelle Arabian, MD    Chief Complaint  Patient presents with   Shortness of Breath    Brief Narrative:  Patient 67 year old female history of hypertension GERD, morbid obesity, type 2 diabetes, chronic diastolic CHF, chronic venous stasis changes bilateral lower extremity presented to the ED with a 3-day history of worsening shortness of breath, dysuria, urgency and frequency, chronic sciatica, some right-sided worsening weakness.  Patient on admission noted to have hypertensive urgency, urinalysis concerning for UTI.  Patient placed on empiric IV antibiotics, blood pressure management and patient admitted for further evaluation and management.   Assessment & Plan:   Principal Problem:   Hypertensive urgency Active Problems:   Bilateral lower extremity edema   Diabetes mellitus type 2, diet-controlled (HCC)   Stasis dermatitis of both legs   Asthma   GERD (gastroesophageal reflux disease)   Morbid obesity due to excess calories (HCC)   High cholesterol   Chronic diastolic CHF (congestive heart failure) (HCC)   Chronic right-sided low back pain with right-sided sciatica   Ulcers of both lower extremities, limited to breakdown of skin (HCC)   Postural dizziness with presyncope   Urinary tract infection without hematuria  #1 hypertensive urgency -Patient presented with a 3-day history of worsening shortness of breath, worsening lower extremity edema, some right-sided weakness worse from baseline, worsening lower extremity edema, an episode of lightheadedness/spinning sensation. -Patient noted in the ED on presentation to have a blood pressure of 221/107. -Patient endorses compliance with medications. -Patient given labetalol with some improvement with blood pressure. -Systolic blood pressures in the 160s to 170s today. -Continue current dose Coreg twice daily, Lasix 40 mg daily.  Lisinopril  increased to 20 mg daily which we will continue for now and can uptitrate to 40 mg daily in the next 24 hours for better blood pressure control. -IV hydralazine as needed.   2.  GERD -Continue PPI.   3.  Chronic diastolic CHF -Patient with chronic lower extremity edema, with chronic venous stasis changes. -Patient not hypoxic with sats greater than 92% on room air. -Respiratory exam without any crackles.  Chest x-ray negative for any acute infiltrate. -BNP within normal limits. -Patient likely euvolemic at baseline. -Continue home regimen Lasix, continue Coreg.   #4.  Right-sided weakness/numbness -Patient with some complaints of slightly worsening chronic right-sided weakness and numbness. -Patient did endorse history of sciatica and chronic numbness in her feet. -Likely secondary to hypertensive urgency versus possible UTI. -Head CT done negative for any acute abnormalities. -Patient currently refusing MRI brain due to claustrophobia and states needs sedation. -Patient with no focal neurological deficits noted on physical exam. -Patient improving clinically. -Continue current management for hypertensive urgency, empiric IV antibiotics pending urine culture results.   -PT/OT.    5.  UTI -Patient with symptoms of dysuria, worsening urgency, worsening frequency. -Urinalysis concerning for UTI. -Urine cultures pending.   -IV Rocephin.    6.  Diabetes is mellitus type II -Hemoglobin A1c 9.8  (05/30/2022 ). -CBG 236 this morning. -Patient started on Semglee 10 units daily. -Continue to hold oral hypoglycemic agents. -SSI. -May need to be started on meal coverage NovoLog.   7.  Hyperlipidemia -Continue home regimen statin.   8.  Chronic bilateral lower extremity edema/chronic venous stasis changes/chronic ulcers bilateral lower extremity limited with skin breakdown -WOC Consult   9.  Obesity -Patient states has not had 100 pound weight loss over the  past 2 years. -Patient  commended on weight loss. -Lifestyle modification. -Outpatient follow-up with PCP.   DVT prophylaxis: Lovenox Code Status: DNR Family Communication: Updated patient.  No family at bedside. Disposition: Likely home when clinically improved and blood pressure better controlled.  Status is: Observation The patient remains OBS appropriate and will d/c before 2 midnights.   Consultants:  None  Procedures:  CT head 05/29/2022 Chest x-ray 05/29/2022   Antimicrobials:  IV Rocephin 05/29/2022>>>>   Subjective: Patient sitting up in recliner.  Patient states overall feeling better.  States right-sided weakness improving.  Denies any significant shortness of breath.  No chest pain.  Overall feeling better than she did on admission.  Objective: Vitals:   05/30/22 0957 05/30/22 1147 05/30/22 1221 05/30/22 1647  BP: (!) 174/63 (!) 147/77  (!) 174/80  Pulse: 77 74  72  Resp:  20    Temp:  98.4 F (36.9 C)  98.2 F (36.8 C)  TempSrc:  Oral  Oral  SpO2:  93%  95%  Weight:      Height:   5' 6"$  (1.676 m)     Intake/Output Summary (Last 24 hours) at 05/30/2022 1927 Last data filed at 05/30/2022 1524 Gross per 24 hour  Intake 103 ml  Output --  Net 103 ml   Filed Weights   05/29/22 1335  Weight: 99 kg    Examination:  General exam: Appears calm and comfortable  Respiratory system: Clear to auscultation. Respiratory effort normal. Cardiovascular system: S1 & S2 heard, RRR. No JVD, murmurs, rubs, gallops or clicks. No pedal edema. Gastrointestinal system: Abdomen is nondistended, soft and nontender. No organomegaly or masses felt. Normal bowel sounds heard. Central nervous system: Alert and oriented. No focal neurological deficits. Extremities: Trace bilateral lower extremity edema.  Skin: No rashes, lesions or ulcers Psychiatry: Judgement and insight appear normal. Mood & affect appropriate.     Data Reviewed: I have personally reviewed following labs and imaging  studies  CBC: Recent Labs  Lab 05/29/22 1353 05/30/22 0550  WBC 6.2 7.0  NEUTROABS 5.0  --   HGB 13.8 12.7  HCT 42.5 38.5  MCV 85.5 85.4  PLT 280 [id number]    Basic Metabolic Panel: Recent Labs  Lab 05/29/22 1353 05/30/22 0550  NA 140 138  K 4.0 3.7  CL 104 102  CO2 23 26  GLUCOSE 371* 252*  BUN 13 9  CREATININE 0.91 0.64  CALCIUM 8.9 8.7*  MG  --  2.1    GFR: Estimated Creatinine Clearance: 79.9 mL/min (by C-G formula based on SCr of 0.64 mg/dL).  Liver Function Tests: Recent Labs  Lab 05/29/22 1353 05/30/22 0550  AST 15 11*  ALT 10 10  ALKPHOS 85 71  BILITOT 0.8 1.0  PROT 8.1 6.6  ALBUMIN 3.4* 3.0*    CBG: Recent Labs  Lab 05/29/22 2249 05/30/22 0809 05/30/22 1142 05/30/22 1626  GLUCAP 252* 236* 315* 356*     Recent Results (from the past 240 hour(s))  Resp panel by RT-PCR (RSV, Flu A&B, Covid) Anterior Nasal Swab     Status: None   Collection Time: 05/29/22  2:06 PM   Specimen: Anterior Nasal Swab  Result Value Ref Range Status   SARS Coronavirus 2 by RT PCR NEGATIVE NEGATIVE Final    Comment: (NOTE) SARS-CoV-2 target nucleic acids are NOT DETECTED.  The SARS-CoV-2 RNA is generally detectable in upper respiratory specimens during the acute phase of infection. The lowest concentration of SARS-CoV-2 viral copies this assay  can detect is 138 copies/mL. A negative result does not preclude SARS-Cov-2 infection and should not be used as the sole basis for treatment or other patient management decisions. A negative result may occur with  improper specimen collection/handling, submission of specimen other than nasopharyngeal swab, presence of viral mutation(s) within the areas targeted by this assay, and inadequate number of viral copies(<138 copies/mL). A negative result must be combined with clinical observations, patient history, and epidemiological information. The expected result is Negative.  Fact Sheet for Patients:   EntrepreneurPulse.com.au  Fact Sheet for Healthcare Providers:  IncredibleEmployment.be  This test is no t yet approved or cleared by the [place] FDA and  has been authorized for detection and/or diagnosis of SARS-CoV-2 by FDA under an Emergency Use Authorization (EUA). This EUA will remain  in effect (meaning this test can be used) for the duration of the COVID-19 declaration under Section 564(b)(1) of the Act, 21 U.S.C.section 360bbb-3(b)(1), unless the authorization is terminated  or revoked sooner.       Influenza A by PCR NEGATIVE NEGATIVE Final   Influenza B by PCR NEGATIVE NEGATIVE Final    Comment: (NOTE) The Xpert Xpress SARS-CoV-2/FLU/RSV plus assay is intended as an aid in the diagnosis of influenza from Nasopharyngeal swab specimens and should not be used as a sole basis for treatment. Nasal washings and aspirates are unacceptable for Xpert Xpress SARS-CoV-2/FLU/RSV testing.  Fact Sheet for Patients: EntrepreneurPulse.com.au  Fact Sheet for Healthcare Providers: IncredibleEmployment.be  This test is not yet approved or cleared by the [place] FDA and has been authorized for detection and/or diagnosis of SARS-CoV-2 by FDA under an Emergency Use Authorization (EUA). This EUA will remain in effect (meaning this test can be used) for the duration of the COVID-19 declaration under Section 564(b)(1) of the Act, 21 U.S.C. section 360bbb-3(b)(1), unless the authorization is terminated or revoked.     Resp Syncytial Virus by PCR NEGATIVE NEGATIVE Final    Comment: (NOTE) Fact Sheet for Patients: EntrepreneurPulse.com.au  Fact Sheet for Healthcare Providers: IncredibleEmployment.be  This test is not yet approved or cleared by the [place] FDA and has been authorized for detection and/or diagnosis of SARS-CoV-2 by FDA under an Emergency Use  Authorization (EUA). This EUA will remain in effect (meaning this test can be used) for the duration of the COVID-19 declaration under Section 564(b)(1) of the Act, 21 U.S.C. section 360bbb-3(b)(1), unless the authorization is terminated or revoked.  Performed at [facility], [place] [address] [place], [place] [address]   MRSA Next Gen by PCR, Nasal     Status: Abnormal   Collection Time: 05/30/22  1:45 PM   Specimen: Nasal Mucosa; Nasal Swab  Result Value Ref Range Status   MRSA by PCR Next Gen DETECTED (A) NOT DETECTED Final    Comment: (NOTE) The GeneXpert MRSA Assay (FDA approved for NASAL specimens only), is one component of a comprehensive MRSA colonization surveillance program. It is not intended to diagnose MRSA infection nor to guide or monitor treatment for MRSA infections. Test performance is not FDA approved in patients less than 79 years old. Performed at [facility], [place] [address] [place], [place] [address]          Radiology Studies: CT Head Wo Contrast  Result Date: 05/29/2022 CLINICAL DATA:  Headache EXAM: CT HEAD WITHOUT CONTRAST TECHNIQUE: Contiguous axial images were obtained from the base of the skull through the vertex without intravenous contrast. RADIATION DOSE REDUCTION: This exam was  performed according to the departmental dose-optimization program which includes automated exposure control, adjustment of the mA and/or kV according to patient size and/or use of iterative reconstruction technique. COMPARISON:  CT head 02/24/2022 FINDINGS: Brain: No evidence of acute infarction, hemorrhage, hydrocephalus, extra-axial collection or mass lesion/mass effect. Vascular: No hyperdense vessel or unexpected calcification. Skull: Normal. Negative for fracture or focal lesion. Sinuses/Orbits: No acute finding. Other: None. IMPRESSION: No acute intracranial abnormality. Electronically Signed   By: Ronney Asters M.D.   On: 05/29/2022  15:49   DG Chest 2 View  Result Date: 05/29/2022 CLINICAL DATA:  Shortness of breath EXAM: CHEST - 2 VIEW COMPARISON:  12/29/2021 FINDINGS: The heart size and mediastinal contours are within normal limits. Both lungs are clear. The visualized skeletal structures are unremarkable. Mild degenerative changes along the spine. Degenerative changes of the shoulders. IMPRESSION: No acute cardiopulmonary disease Electronically Signed   By: Jill Side M.D.   On: 05/29/2022 13:50        Scheduled Meds:  carvedilol  6.25 mg Oral BID WC   enoxaparin (LOVENOX) injection  40 mg Subcutaneous Q24H   fenofibrate  54 mg Oral q morning   furosemide  40 mg Oral Daily   insulin aspart  0-15 Units Subcutaneous TID WC   insulin glargine-yfgn  10 Units Subcutaneous Daily   [START ON 05/31/2022] lisinopril  40 mg Oral Daily   pantoprazole  40 mg Oral Q0600   rosuvastatin  20 mg Oral q morning   sodium chloride flush  3 mL Intravenous Q12H   Continuous Infusions:  sodium chloride     cefTRIAXone (ROCEPHIN)  IV Stopped (05/30/22 0909)     LOS: 0 days    Time spent: 40 minutes    Irine Seal, MD [facility]   To contact the attending provider between 7A-7P or the covering provider during after hours 7P-7A, please log into the web site [email or web address] and access using universal [place] password for that web site. If you do not have the password, please call the hospital operator.  05/30/2022, 7:27 PM

## 2022-05-30 NOTE — Inpatient Diabetes Management (Signed)
Inpatient Diabetes Program Recommendations  AACE/ADA: New Consensus Statement on Inpatient Glycemic Control (2015)  Target Ranges:  Prepandial:   less than 140 mg/dL      Peak postprandial:   less than 180 mg/dL (1-2 hours)      Critically ill patients:  140 - 180 mg/dL   Lab Results  Component Value Date   GLUCAP 315 (H) 05/30/2022   HGBA1C 9.8 (H) 05/30/2022    Review of Glycemic Control  Latest Reference Range & Units 05/29/22 22:49 05/30/22 08:09 05/30/22 11:42  Glucose-Capillary 70 - 99 mg/dL 252 (H) 236 (H) 315 (H)  (H): Data is abnormally high  Diabetes history: DM2 Outpatient Diabetes medications: Amaryl 4 mg QD, Prandin 1 mg QD Current orders for Inpatient glycemic control: Semglee 10 units QD, Novolog 0-15 units TID  Spoke with patient at bedside.  Reviewed patient's current A1c of 9.8% (average BG of 235 mg/dL). Explained what a A1c is and what it measures. Also reviewed goal A1c with patient, importance of good glucose control @ home, and blood sugar goals.  She states this is what her A1C usually runs.  She states she cannot start insulin because of arthritis in her hands; she cannot make a fist and often cannot write/hold a pen or pencil.    She states she used to be on Januvia and her glucose was much better.  Her [employer] stopped covering it.  She recently had an insurance change to [employer].   Will ask pharmacy for a benefit check.    She does not check her glucose at home because she is out of strips and states that insurance does not cover strips??  Will ask MD for new order for a glucometer and supplies at DC.  Explained she can purchase a ReliOn glucometer at [employer] for $20 with 50 strips and when she runs out of strips she can purchase more at [employer] without a prescription.  She verbalizes understanding.    She uses a microwave, a crock pot and a small refrigerator; her oven and stove are not functioning.  Discussed importance of modifying CHO's and being  mindful of portion control.  Ordered LWWD booklet.  Added carb modified to diet.   For DC please order a glucometer Order # [id number]  Will continue to follow while inpatient.  Thank you, Reche Dixon, MSN, [place] Diabetes Coordinator Inpatient Diabetes Program [phone] (team pager from 8a-5p)

## 2022-05-31 DIAGNOSIS — M5441 Lumbago with sciatica, right side: Secondary | ICD-10-CM | POA: Diagnosis not present

## 2022-05-31 DIAGNOSIS — I110 Hypertensive heart disease with heart failure: Secondary | ICD-10-CM | POA: Diagnosis not present

## 2022-05-31 DIAGNOSIS — K219 Gastro-esophageal reflux disease without esophagitis: Secondary | ICD-10-CM | POA: Diagnosis not present

## 2022-05-31 DIAGNOSIS — Z66 Do not resuscitate: Secondary | ICD-10-CM | POA: Diagnosis not present

## 2022-05-31 DIAGNOSIS — L97921 Non-pressure chronic ulcer of unspecified part of left lower leg limited to breakdown of skin: Secondary | ICD-10-CM | POA: Diagnosis not present

## 2022-05-31 DIAGNOSIS — L97911 Non-pressure chronic ulcer of unspecified part of right lower leg limited to breakdown of skin: Secondary | ICD-10-CM | POA: Diagnosis not present

## 2022-05-31 DIAGNOSIS — E876 Hypokalemia: Secondary | ICD-10-CM | POA: Diagnosis not present

## 2022-05-31 DIAGNOSIS — Z1611 Resistance to penicillins: Secondary | ICD-10-CM | POA: Diagnosis not present

## 2022-05-31 DIAGNOSIS — Z79899 Other long term (current) drug therapy: Secondary | ICD-10-CM | POA: Diagnosis not present

## 2022-05-31 DIAGNOSIS — E1151 Type 2 diabetes mellitus with diabetic peripheral angiopathy without gangrene: Secondary | ICD-10-CM | POA: Diagnosis not present

## 2022-05-31 DIAGNOSIS — N390 Urinary tract infection, site not specified: Secondary | ICD-10-CM | POA: Diagnosis not present

## 2022-05-31 DIAGNOSIS — M7989 Other specified soft tissue disorders: Secondary | ICD-10-CM | POA: Diagnosis not present

## 2022-05-31 DIAGNOSIS — I872 Venous insufficiency (chronic) (peripheral): Secondary | ICD-10-CM | POA: Diagnosis not present

## 2022-05-31 DIAGNOSIS — I160 Hypertensive urgency: Secondary | ICD-10-CM | POA: Diagnosis not present

## 2022-05-31 DIAGNOSIS — G8929 Other chronic pain: Secondary | ICD-10-CM | POA: Diagnosis not present

## 2022-05-31 DIAGNOSIS — E119 Type 2 diabetes mellitus without complications: Secondary | ICD-10-CM | POA: Diagnosis not present

## 2022-05-31 DIAGNOSIS — N3000 Acute cystitis without hematuria: Secondary | ICD-10-CM | POA: Diagnosis not present

## 2022-05-31 DIAGNOSIS — Z1152 Encounter for screening for COVID-19: Secondary | ICD-10-CM | POA: Diagnosis not present

## 2022-05-31 DIAGNOSIS — R600 Localized edema: Secondary | ICD-10-CM | POA: Diagnosis not present

## 2022-05-31 DIAGNOSIS — J45909 Unspecified asthma, uncomplicated: Secondary | ICD-10-CM | POA: Diagnosis not present

## 2022-05-31 DIAGNOSIS — B961 Klebsiella pneumoniae [K. pneumoniae] as the cause of diseases classified elsewhere: Secondary | ICD-10-CM | POA: Diagnosis not present

## 2022-05-31 DIAGNOSIS — Z6835 Body mass index (BMI) 35.0-35.9, adult: Secondary | ICD-10-CM | POA: Diagnosis not present

## 2022-05-31 DIAGNOSIS — I878 Other specified disorders of veins: Secondary | ICD-10-CM | POA: Diagnosis not present

## 2022-05-31 DIAGNOSIS — E6609 Other obesity due to excess calories: Secondary | ICD-10-CM | POA: Diagnosis not present

## 2022-05-31 DIAGNOSIS — E7800 Pure hypercholesterolemia, unspecified: Secondary | ICD-10-CM | POA: Diagnosis not present

## 2022-05-31 DIAGNOSIS — R0602 Shortness of breath: Secondary | ICD-10-CM | POA: Diagnosis not present

## 2022-05-31 DIAGNOSIS — F40240 Claustrophobia: Secondary | ICD-10-CM | POA: Diagnosis not present

## 2022-05-31 DIAGNOSIS — I5032 Chronic diastolic (congestive) heart failure: Secondary | ICD-10-CM | POA: Diagnosis not present

## 2022-05-31 LAB — GLUCOSE, CAPILLARY
Glucose-Capillary: 239 mg/dL — ABNORMAL HIGH (ref 70–99)
Glucose-Capillary: 285 mg/dL — ABNORMAL HIGH (ref 70–99)
Glucose-Capillary: 283 mg/dL — ABNORMAL HIGH (ref 70–99)
Glucose-Capillary: 235 mg/dL — ABNORMAL HIGH (ref 70–99)

## 2022-05-31 LAB — CBC
HCT: 39.1 % (ref 36.0–46.0)
Hemoglobin: 12.7 g/dL (ref 12.0–15.0)
MCH: 28.1 pg (ref 26.0–34.0)
MCHC: 32.5 g/dL (ref 30.0–36.0)
MCV: 86.5 fL (ref 80.0–100.0)
Platelets: 204 10*3/uL (ref 150–400)
RBC: 4.52 MIL/uL (ref 3.87–5.11)
RDW: 13.7 % (ref 11.5–15.5)
WBC: 5.5 10*3/uL (ref 4.0–10.5)
nRBC: 0 % (ref 0.0–0.2)

## 2022-05-31 LAB — BASIC METABOLIC PANEL
Anion gap: 10 (ref 5–15)
BUN: 16 mg/dL (ref 8–23)
CO2: 27 mmol/L (ref 22–32)
Calcium: 8.5 mg/dL — ABNORMAL LOW (ref 8.9–10.3)
Chloride: 99 mmol/L (ref 98–111)
Creatinine, Ser: 0.78 mg/dL (ref 0.44–1.00)
GFR, Estimated: >60 mL/min (ref >60)
Glucose, Bld: 224 mg/dL — ABNORMAL HIGH (ref 70–99)
Potassium: 3.3 mmol/L — ABNORMAL LOW (ref 3.5–5.1)
Sodium: 136 mmol/L (ref 135–145)

## 2022-05-31 MED ORDER — POTASSIUM CHLORIDE CRYS ER 10 MEQ PO TBCR
40.0000 meq | EXTENDED_RELEASE_TABLET | Freq: Once | ORAL | Status: AC
  Administered 2022-05-31: 40 meq via ORAL
  Filled 2022-05-31: qty 4

## 2022-05-31 NOTE — Progress Notes (Addendum)
PROGRESS NOTE    Amanda Butler  [id number] DOB: 09/20/1955 DOA: 05/29/2022 PCP: Gaynelle Arabian, MD    Chief Complaint  Patient presents with   Shortness of Breath    Brief Narrative:  Patient 67 year old female history of hypertension GERD, morbid obesity, type 2 diabetes, chronic diastolic CHF, chronic venous stasis changes bilateral lower extremity presented to the ED with a 3-day history of worsening shortness of breath, dysuria, urgency and frequency, chronic sciatica, some right-sided worsening weakness.  Patient on admission noted to have hypertensive urgency, urinalysis concerning for UTI.  Patient placed on empiric IV antibiotics, blood pressure management and patient admitted for further evaluation and management.   Assessment & Plan:   Principal Problem:   Hypertensive urgency Active Problems:   Bilateral lower extremity edema   Diabetes mellitus type 2, diet-controlled (HCC)   Stasis dermatitis of both legs   Asthma   GERD (gastroesophageal reflux disease)   Morbid obesity due to excess calories (HCC)   High cholesterol   Chronic diastolic CHF (congestive heart failure) (HCC)   Chronic right-sided low back pain with right-sided sciatica   Ulcers of both lower extremities, limited to breakdown of skin (HCC)   Postural dizziness with presyncope   Urinary tract infection without hematuria   SOB (shortness of breath)  #1 hypertensive urgency -Patient presented with a 3-day history of worsening shortness of breath, worsening lower extremity edema, some right-sided weakness worse from baseline, worsening lower extremity edema, an episode of lightheadedness/spinning sensation. -Patient noted in the ED on presentation to have a blood pressure of 221/107. -Patient endorses compliance with medications. -Patient given labetalol with some improvement with blood pressure. -BP improving.   -Continue Coreg 6.25 mg twice daily, Lasix 40 mg daily, increase lisinopril to 40 mg  daily.  IV hydralazine as needed.   -Outpatient follow-up with PCP.     2.  GERD -PPI.   3.  Chronic diastolic CHF -Patient with chronic lower extremity edema, with chronic venous stasis changes. -Patient not hypoxic with sats greater than 92% on room air. -Respiratory exam without any crackles.  Chest x-ray negative for any acute infiltrate. -BNP within normal limits. -Patient likely euvolemic at baseline. -Continue home regimen Lasix, continue Coreg.   #4.  Right-sided weakness/numbness -Patient with some complaints of slightly worsening chronic right-sided weakness and numbness. -Patient did endorse history of sciatica and chronic numbness in her feet. -Likely secondary to hypertensive urgency versus possible UTI. -Head CT done negative for any acute abnormalities. -Patient currently refusing MRI brain due to claustrophobia and states needs sedation. -Patient with no focal neurological deficits noted on physical exam. -Clinical improvement daily.   -Continue empiric IV antibiotics pending urine culture results, and management of hypertensive urgency.   -PT/OT.    5.  UTI -Patient with symptoms of dysuria, worsening urgency, worsening frequency. -Urinalysis concerning for UTI. -Urine cultures pending.   -Continue IV Rocephin.    6.  Diabetes is mellitus type II -Hemoglobin A1c 9.8  (05/30/2022 ). -CBG 239 this morning. -Increase Semglee to 15 units daily.  -Start NovoLog meal coverage 4 units 3 times daily with meals. -SSI.  7.  Hypokalemia -Replete.   8.  Hyperlipidemia -Statin.    9.  Chronic bilateral lower extremity edema/chronic venous stasis changes/chronic ulcers bilateral lower extremity limited with skin breakdown -WOC Consult pending.   10.  Obesity -Patient states has not had 100 pound weight loss over the past 2 years. -Patient commended on weight loss. -Lifestyle modification. -Outpatient follow-up  with PCP.   DVT prophylaxis: Lovenox Code Status:  DNR Family Communication: Updated patient.  No family at bedside. Disposition: Likely home when clinically improved and blood pressure better controlled hopefully in the next 24 to 48 hours.  Status is: Observation The patient remains OBS appropriate and will d/c before 2 midnights.   Consultants:  None  Procedures:  CT head 05/29/2022 Chest x-ray 05/29/2022   Antimicrobials:  IV Rocephin 05/29/2022>>>>   Subjective: Sitting up in recliner.  Stated she felt hot and cold last night however got some medication and was able to sleep well.  Denies any significant shortness of breath.  No chest pain.  Feels right-sided weakness improving.  Tolerating current diet.  Overall feels she is slowly getting better.    Objective: Vitals:   05/30/22 1221 05/30/22 1647 05/30/22 2026 05/31/22 0528  BP:  (!) 174/80 137/65 (!) 149/74  Pulse:  72 68 65  Resp:   15 16  Temp:  98.2 F (36.8 C) 98.4 F (36.9 C) 98.1 F (36.7 C)  TempSrc:  Oral Oral Oral  SpO2:  95% 94% 95%  Weight:      Height: 5' 6"$  (1.676 m)       Intake/Output Summary (Last 24 hours) at 05/31/2022 0949 Last data filed at 05/30/2022 2000 Gross per 24 hour  Intake 350 ml  Output --  Net 350 ml    Filed Weights   05/29/22 1335  Weight: 99 kg    Examination:  General exam: NAD Respiratory system: CTAB.  No wheezes, no crackles, no rhonchi.  Fair air movement.  Speaking in full sentences.  Cardiovascular system: RRR no murmurs rubs or gallops.  No JVD.  No lower extremity edema.  Trace bilateral lower extremity edema.  Gastrointestinal system: Abdomen is soft, nontender, nondistended, positive bowel sounds.  No rebound.  No guarding.  Central nervous system: Alert and oriented. No focal neurological deficits. Extremities: Trace bilateral lower extremity edema.  Skin: No rashes, lesions or ulcers Psychiatry: Judgement and insight appear normal. Mood & affect appropriate.     Data Reviewed: I have personally reviewed  following labs and imaging studies  CBC: Recent Labs  Lab 05/29/22 1353 05/30/22 0550 05/31/22 0541  WBC 6.2 7.0 5.5  NEUTROABS 5.0  --   --   HGB 13.8 12.7 12.7  HCT 42.5 38.5 39.1  MCV 85.5 85.4 86.5  PLT 280 217 204     Basic Metabolic Panel: Recent Labs  Lab 05/29/22 1353 05/30/22 0550 05/31/22 0541  NA 140 138 136  K 4.0 3.7 3.3*  CL 104 102 99  CO2 23 26 27  $ GLUCOSE 371* 252* 224*  BUN 13 9 16  $ CREATININE 0.91 0.64 0.78  CALCIUM 8.9 8.7* 8.5*  MG  --  2.1  --      GFR: Estimated Creatinine Clearance: 79.9 mL/min (by C-G formula based on SCr of 0.78 mg/dL).  Liver Function Tests: Recent Labs  Lab 05/29/22 1353 05/30/22 0550  AST 15 11*  ALT 10 10  ALKPHOS 85 71  BILITOT 0.8 1.0  PROT 8.1 6.6  ALBUMIN 3.4* 3.0*     CBG: Recent Labs  Lab 05/30/22 0809 05/30/22 1142 05/30/22 1626 05/30/22 2101 05/31/22 0756  GLUCAP 236* 315* 356* 220* 239*      Recent Results (from the past 240 hour(s))  Resp panel by RT-PCR (RSV, Flu A&B, Covid) Anterior Nasal Swab     Status: None   Collection Time: 05/29/22  2:06 PM  Specimen: Anterior Nasal Swab  Result Value Ref Range Status   SARS Coronavirus 2 by RT PCR NEGATIVE NEGATIVE Final    Comment: (NOTE) SARS-CoV-2 target nucleic acids are NOT DETECTED.  The SARS-CoV-2 RNA is generally detectable in upper respiratory specimens during the acute phase of infection. The lowest concentration of SARS-CoV-2 viral copies this assay can detect is 138 copies/mL. A negative result does not preclude SARS-Cov-2 infection and should not be used as the sole basis for treatment or other patient management decisions. A negative result may occur with  improper specimen collection/handling, submission of specimen other than nasopharyngeal swab, presence of viral mutation(s) within the areas targeted by this assay, and inadequate number of viral copies(<138 copies/mL). A negative result must be combined with clinical  observations, patient history, and epidemiological information. The expected result is Negative.  Fact Sheet for Patients:  EntrepreneurPulse.com.au  Fact Sheet for Healthcare Providers:  IncredibleEmployment.be  This test is no t yet approved or cleared by the [place] FDA and  has been authorized for detection and/or diagnosis of SARS-CoV-2 by FDA under an Emergency Use Authorization (EUA). This EUA will remain  in effect (meaning this test can be used) for the duration of the COVID-19 declaration under Section 564(b)(1) of the Act, 21 U.S.C.section 360bbb-3(b)(1), unless the authorization is terminated  or revoked sooner.       Influenza A by PCR NEGATIVE NEGATIVE Final   Influenza B by PCR NEGATIVE NEGATIVE Final    Comment: (NOTE) The Xpert Xpress SARS-CoV-2/FLU/RSV plus assay is intended as an aid in the diagnosis of influenza from Nasopharyngeal swab specimens and should not be used as a sole basis for treatment. Nasal washings and aspirates are unacceptable for Xpert Xpress SARS-CoV-2/FLU/RSV testing.  Fact Sheet for Patients: EntrepreneurPulse.com.au  Fact Sheet for Healthcare Providers: IncredibleEmployment.be  This test is not yet approved or cleared by the [place] FDA and has been authorized for detection and/or diagnosis of SARS-CoV-2 by FDA under an Emergency Use Authorization (EUA). This EUA will remain in effect (meaning this test can be used) for the duration of the COVID-19 declaration under Section 564(b)(1) of the Act, 21 U.S.C. section 360bbb-3(b)(1), unless the authorization is terminated or revoked.     Resp Syncytial Virus by PCR NEGATIVE NEGATIVE Final    Comment: (NOTE) Fact Sheet for Patients: EntrepreneurPulse.com.au  Fact Sheet for Healthcare Providers: IncredibleEmployment.be  This test is not yet approved or cleared by  the [place] FDA and has been authorized for detection and/or diagnosis of SARS-CoV-2 by FDA under an Emergency Use Authorization (EUA). This EUA will remain in effect (meaning this test can be used) for the duration of the COVID-19 declaration under Section 564(b)(1) of the Act, 21 U.S.C. section 360bbb-3(b)(1), unless the authorization is terminated or revoked.  Performed at [facility], [place] [address], [place], [place] [address]   Urine Culture (for pregnant, neutropenic or urologic patients or patients with an indwelling urinary catheter)     Status: Abnormal (Preliminary result)   Collection Time: 05/29/22  9:13 PM   Specimen: Urine, Catheterized  Result Value Ref Range Status   Specimen Description   Final    URINE, CATHETERIZED Performed at [facility] [address] [place], [place] [address]    Special Requests   Final    NONE Performed at [facility], [place] [address], [place], [place] [address]    Culture >=100,000 COLONIES/mL KLEBSIELLA PNEUMONIAE (A)  Final   Report  Status PENDING  Incomplete  MRSA Next Gen by PCR, Nasal     Status: Abnormal   Collection Time: 05/30/22  1:45 PM   Specimen: Nasal Mucosa; Nasal Swab  Result Value Ref Range Status   MRSA by PCR Next Gen DETECTED (A) NOT DETECTED Final    Comment: (NOTE) The GeneXpert MRSA Assay (FDA approved for NASAL specimens only), is one component of a comprehensive MRSA colonization surveillance program. It is not intended to diagnose MRSA infection nor to guide or monitor treatment for MRSA infections. Test performance is not FDA approved in patients less than 37 years old. Performed at [facility], [place] [address] [place] [address], [place], [place] [address]          Radiology Studies: CT Head Wo Contrast  Result Date: 05/29/2022 CLINICAL DATA:  Headache EXAM: CT HEAD WITHOUT CONTRAST TECHNIQUE: Contiguous axial images were obtained  from the base of the skull through the vertex without intravenous contrast. RADIATION DOSE REDUCTION: This exam was performed according to the departmental dose-optimization program which includes automated exposure control, adjustment of the mA and/or kV according to patient size and/or use of iterative reconstruction technique. COMPARISON:  CT head 02/24/2022 FINDINGS: Brain: No evidence of acute infarction, hemorrhage, hydrocephalus, extra-axial collection or mass lesion/mass effect. Vascular: No hyperdense vessel or unexpected calcification. Skull: Normal. Negative for fracture or focal lesion. Sinuses/Orbits: No acute finding. Other: None. IMPRESSION: No acute intracranial abnormality. Electronically Signed   By: Ronney Asters M.D.   On: 05/29/2022 15:49   DG Chest 2 View  Result Date: 05/29/2022 CLINICAL DATA:  Shortness of breath EXAM: CHEST - 2 VIEW COMPARISON:  12/29/2021 FINDINGS: The heart size and mediastinal contours are within normal limits. Both lungs are clear. The visualized skeletal structures are unremarkable. Mild degenerative changes along the spine. Degenerative changes of the shoulders. IMPRESSION: No acute cardiopulmonary disease Electronically Signed   By: Jill Side M.D.   On: 05/29/2022 13:50        Scheduled Meds:  carvedilol  6.25 mg Oral BID WC   enoxaparin (LOVENOX) injection  40 mg Subcutaneous Q24H   fenofibrate  54 mg Oral q morning   furosemide  40 mg Oral Daily   insulin aspart  0-15 Units Subcutaneous TID WC   insulin aspart  4 Units Subcutaneous TID WC   insulin glargine-yfgn  15 Units Subcutaneous Daily   lisinopril  40 mg Oral Daily   pantoprazole  40 mg Oral Q0600   rosuvastatin  20 mg Oral q morning   sodium chloride flush  3 mL Intravenous Q12H   Continuous Infusions:  sodium chloride     cefTRIAXone (ROCEPHIN)  IV Stopped (05/30/22 0909)     LOS: 0 days    Time spent: 40 minutes    Irine Seal, MD [facility]   To contact  the attending provider between 7A-7P or the covering provider during after hours 7P-7A, please log into the web site [email or web address] and access using universal [place] password for that web site. If you do not have the password, please call the hospital operator.  05/31/2022, 9:49 AM

## 2022-05-31 NOTE — Plan of Care (Signed)
Patient AOX4, VSS throughout shift.  All meds given on time as ordered.  Pt c/o pain relieved by PRN oxycodone.  Pt c/o indigestion, provider notified and maalox ordered and given for relief.  Diminished lungs, IS encouraged.  Pt ambulated with walker to bathroom.  POC maintained, will continue to monitor.  Problem: Education: Goal: Ability to describe self-care measures that may prevent or decrease complications (Diabetes Survival Skills Education) will improve Outcome: Progressing Goal: Individualized Educational Video(s) Outcome: Progressing   Problem: Coping: Goal: Ability to adjust to condition or change in health will improve Outcome: Progressing   Problem: Fluid Volume: Goal: Ability to maintain a balanced intake and output will improve Outcome: Progressing   Problem: Health Behavior/Discharge Planning: Goal: Ability to identify and utilize available resources and services will improve Outcome: Progressing Goal: Ability to manage health-related needs will improve Outcome: Progressing   Problem: Metabolic: Goal: Ability to maintain appropriate glucose levels will improve Outcome: Progressing   Problem: Nutritional: Goal: Maintenance of adequate nutrition will improve Outcome: Progressing Goal: Progress toward achieving an optimal weight will improve Outcome: Progressing   Problem: Skin Integrity: Goal: Risk for impaired skin integrity will decrease Outcome: Progressing   Problem: Tissue Perfusion: Goal: Adequacy of tissue perfusion will improve Outcome: Progressing   Problem: Education: Goal: Knowledge of General Education information will improve Description: Including pain rating scale, medication(s)/side effects and non-pharmacologic comfort measures Outcome: Progressing   Problem: Health Behavior/Discharge Planning: Goal: Ability to manage health-related needs will improve Outcome: Progressing   Problem: Clinical Measurements: Goal: Ability to maintain  clinical measurements within normal limits will improve Outcome: Progressing Goal: Will remain free from infection Outcome: Progressing Goal: Diagnostic test results will improve Outcome: Progressing Goal: Respiratory complications will improve Outcome: Progressing Goal: Cardiovascular complication will be avoided Outcome: Progressing   Problem: Activity: Goal: Risk for activity intolerance will decrease Outcome: Progressing   Problem: Nutrition: Goal: Adequate nutrition will be maintained Outcome: Progressing   Problem: Coping: Goal: Level of anxiety will decrease Outcome: Progressing   Problem: Elimination: Goal: Will not experience complications related to bowel motility Outcome: Progressing Goal: Will not experience complications related to urinary retention Outcome: Progressing   Problem: Pain Managment: Goal: General experience of comfort will improve Outcome: Progressing   Problem: Safety: Goal: Ability to remain free from injury will improve Outcome: Progressing   Problem: Skin Integrity: Goal: Risk for impaired skin integrity will decrease Outcome: Progressing

## 2022-06-01 ENCOUNTER — Encounter (HOSPITAL_COMMUNITY): Payer: Self-pay | Admitting: Internal Medicine

## 2022-06-01 DIAGNOSIS — I160 Hypertensive urgency: Secondary | ICD-10-CM | POA: Diagnosis not present

## 2022-06-01 DIAGNOSIS — E119 Type 2 diabetes mellitus without complications: Secondary | ICD-10-CM | POA: Diagnosis not present

## 2022-06-01 DIAGNOSIS — I872 Venous insufficiency (chronic) (peripheral): Secondary | ICD-10-CM

## 2022-06-01 DIAGNOSIS — M5441 Lumbago with sciatica, right side: Secondary | ICD-10-CM | POA: Diagnosis not present

## 2022-06-01 DIAGNOSIS — I5032 Chronic diastolic (congestive) heart failure: Secondary | ICD-10-CM | POA: Diagnosis not present

## 2022-06-01 LAB — BASIC METABOLIC PANEL
Anion gap: 9 (ref 5–15)
BUN: 16 mg/dL (ref 8–23)
CO2: 26 mmol/L (ref 22–32)
Calcium: 8.6 mg/dL — ABNORMAL LOW (ref 8.9–10.3)
Chloride: 99 mmol/L (ref 98–111)
Creatinine, Ser: 0.65 mg/dL (ref 0.44–1.00)
GFR, Estimated: >60 mL/min (ref >60)
Glucose, Bld: 251 mg/dL — ABNORMAL HIGH (ref 70–99)
Potassium: 3.6 mmol/L (ref 3.5–5.1)
Sodium: 134 mmol/L — ABNORMAL LOW (ref 135–145)

## 2022-06-01 LAB — GLUCOSE, CAPILLARY
Glucose-Capillary: 237 mg/dL — ABNORMAL HIGH (ref 70–99)
Glucose-Capillary: 274 mg/dL — ABNORMAL HIGH (ref 70–99)
Glucose-Capillary: 273 mg/dL — ABNORMAL HIGH (ref 70–99)
Glucose-Capillary: 309 mg/dL — ABNORMAL HIGH (ref 70–99)

## 2022-06-01 LAB — URINE CULTURE: Culture: >=100000 — AB

## 2022-06-01 MED ORDER — INSULIN GLARGINE-YFGN 100 UNIT/ML ~~LOC~~ SOLN
18.0000 [IU] | Freq: Every day | SUBCUTANEOUS | Status: DC
  Administered 2022-06-01: 18 [IU] via SUBCUTANEOUS
  Filled 2022-06-01 (×2): qty 0.18

## 2022-06-01 MED ORDER — CEFADROXIL 500 MG PO CAPS
500.0000 mg | ORAL_CAPSULE | Freq: Two times a day (BID) | ORAL | Status: DC
  Administered 2022-06-01 – 2022-06-02 (×3): 500 mg via ORAL
  Filled 2022-06-01 (×3): qty 1

## 2022-06-01 MED ORDER — INSULIN ASPART 100 UNIT/ML IJ SOLN
4.0000 [IU] | Freq: Once | INTRAMUSCULAR | Status: AC
  Administered 2022-06-01: 4 [IU] via SUBCUTANEOUS

## 2022-06-01 MED ORDER — INSULIN ASPART 100 UNIT/ML IJ SOLN
6.0000 [IU] | Freq: Three times a day (TID) | INTRAMUSCULAR | Status: DC
  Administered 2022-06-01 – 2022-06-02 (×3): 6 [IU] via SUBCUTANEOUS

## 2022-06-01 NOTE — Plan of Care (Signed)
Patient AOX4, VSS throughout shift.  All meds given on time as ordered.  Pt denied pain.  Left leg dressing redone.  Diminished lungs, IS encouraged. Pt ambulated with walker to bathroom.  POC maintained, will continue to monitor.  Problem: Education: Goal: Ability to describe self-care measures that may prevent or decrease complications (Diabetes Survival Skills Education) will improve Outcome: Progressing Goal: Individualized Educational Video(s) Outcome: Progressing   Problem: Coping: Goal: Ability to adjust to condition or change in health will improve Outcome: Progressing   Problem: Fluid Volume: Goal: Ability to maintain a balanced intake and output will improve Outcome: Progressing   Problem: Health Behavior/Discharge Planning: Goal: Ability to identify and utilize available resources and services will improve Outcome: Progressing Goal: Ability to manage health-related needs will improve Outcome: Progressing   Problem: Metabolic: Goal: Ability to maintain appropriate glucose levels will improve Outcome: Progressing   Problem: Nutritional: Goal: Maintenance of adequate nutrition will improve Outcome: Progressing Goal: Progress toward achieving an optimal weight will improve Outcome: Progressing   Problem: Skin Integrity: Goal: Risk for impaired skin integrity will decrease Outcome: Progressing   Problem: Tissue Perfusion: Goal: Adequacy of tissue perfusion will improve Outcome: Progressing   Problem: Education: Goal: Knowledge of General Education information will improve Description: Including pain rating scale, medication(s)/side effects and non-pharmacologic comfort measures Outcome: Progressing   Problem: Health Behavior/Discharge Planning: Goal: Ability to manage health-related needs will improve Outcome: Progressing   Problem: Clinical Measurements: Goal: Ability to maintain clinical measurements within normal limits will improve Outcome:  Progressing Goal: Will remain free from infection Outcome: Progressing Goal: Diagnostic test results will improve Outcome: Progressing Goal: Respiratory complications will improve Outcome: Progressing Goal: Cardiovascular complication will be avoided Outcome: Progressing   Problem: Activity: Goal: Risk for activity intolerance will decrease Outcome: Progressing   Problem: Nutrition: Goal: Adequate nutrition will be maintained Outcome: Progressing   Problem: Coping: Goal: Level of anxiety will decrease Outcome: Progressing   Problem: Elimination: Goal: Will not experience complications related to bowel motility Outcome: Progressing Goal: Will not experience complications related to urinary retention Outcome: Progressing   Problem: Pain Managment: Goal: General experience of comfort will improve Outcome: Progressing   Problem: Safety: Goal: Ability to remain free from injury will improve Outcome: Progressing   Problem: Skin Integrity: Goal: Risk for impaired skin integrity will decrease Outcome: Progressing

## 2022-06-01 NOTE — Progress Notes (Addendum)
PROGRESS NOTE    Amanda Butler  [id number] DOB: 10-Jan-1956 DOA: 05/29/2022 PCP: Gaynelle Arabian, MD    Chief Complaint  Patient presents with   Shortness of Breath    Brief Narrative:  Patient 67 year old female history of hypertension GERD, morbid obesity, type 2 diabetes, chronic diastolic CHF, chronic venous stasis changes bilateral lower extremity presented to the ED with a 3-day history of worsening shortness of breath, dysuria, urgency and frequency, chronic sciatica, some right-sided worsening weakness.  Patient on admission noted to have hypertensive urgency, urinalysis concerning for UTI.  Patient placed on empiric IV antibiotics, blood pressure management and patient admitted for further evaluation and management.   Assessment & Plan:   Principal Problem:   Hypertensive urgency Active Problems:   Bilateral lower extremity edema   Diabetes mellitus type 2, diet-controlled (HCC)   Stasis dermatitis of both legs   Asthma   GERD (gastroesophageal reflux disease)   Morbid obesity due to excess calories (HCC)   High cholesterol   Chronic diastolic CHF (congestive heart failure) (HCC)   Chronic right-sided low back pain with right-sided sciatica   Ulcers of both lower extremities, limited to breakdown of skin (HCC)   Postural dizziness with presyncope   Urinary tract infection without hematuria   SOB (shortness of breath)  #1 hypertensive urgency -Patient presented with a 3-day history of worsening shortness of breath, worsening lower extremity edema, some right-sided weakness worse from baseline, worsening lower extremity edema, an episode of lightheadedness/spinning sensation. -Patient noted in the ED on presentation to have a blood pressure of 221/107. -Patient endorses compliance with medications. -Patient given labetalol with some improvement with blood pressure on initial presentation to the ED. -Blood pressure improving on current regimen of Coreg 6.25 mg twice  daily, Lasix 40 mg daily, lisinopril 40 mg daily.  IV hydralazine as needed. -Outpatient follow-up with PCP.     2.  GERD -Continue PPI.   3.  Chronic diastolic CHF -Patient with chronic lower extremity edema, with chronic venous stasis changes. -Patient not hypoxic with sats of 97% on room air. -Respiratory exam without any crackles.  Chest x-ray negative for any acute infiltrate. -BNP within normal limits. -Patient likely euvolemic at baseline. -Continue home regimen Lasix, continue Coreg.   #4.  Right-sided weakness/numbness -Patient with some complaints of slightly worsening chronic right-sided weakness and numbness on presentation. -Patient did endorse history of sciatica and chronic numbness in her feet. -Likely secondary to hypertensive urgency versus possible UTI. -Head CT done negative for any acute abnormalities. -Patient currently refusing MRI brain due to claustrophobia and states needs sedation. -Patient with no focal neurological deficits noted on physical exam. -Improving daily.   -Urinalysis concerning for UTI and urine cultures positive for Klebsiella pneumonia.   -Continue antibiotic treatment for UTI and management of hypertensive urgency.   -PT/OT.     5.  Klebsiella pneumonia UTI -Patient with symptoms of dysuria, worsening urgency, worsening frequency on presentation.. -Urinalysis concerning for UTI. -Urine cultures with . 100,000 colonies of Klebsiella pneumonia sensitive to the cephalosporins, quinolones, gentamicin, imipenem, Bactrim, Unasyn and Zosyn.  Resistant to ampicillin and nitrofurantoin.   -Transition from IV Rocephin to Duricef to complete a 5-day course of treatment.    6.  Diabetes is mellitus type II -Hemoglobin A1c 9.8  (05/30/2022 ). -CBG 237 this morning. -Increase Semglee to 18 units daily.   -Increase NovoLog meal coverage to 6 units 3 times daily with meals.  -SSI.    7.  Hypokalemia -Repleted,  potassium at 3.6.   8.   Hyperlipidemia -Statin.    9.  Chronic bilateral lower extremity edema/chronic venous stasis changes/chronic ulcers bilateral lower extremity limited with skin breakdown, POA   -WOC for has placed orders for topical treatments.   10.  Obesity -Patient states has not had 100 pound weight loss over the past 2 years. -Patient commended on weight loss. -Lifestyle modification. -Outpatient follow-up with PCP.   DVT prophylaxis: Lovenox Code Status: DNR Family Communication: Updated patient.  No family at bedside. Disposition: Likely home when clinically improved and blood pressure better controlled hopefully in 24 hours.    Status is: Inpatient    Consultants:  None  Procedures:  CT head 05/29/2022 Chest x-ray 05/29/2022   Antimicrobials:  IV Rocephin 05/29/2022>>>> 06/01/2022 Duricef 06/01/2022   Subjective: Sitting up in chair on his cell phone.  Denies any chest pain.  No shortness of breath.  No abdominal pain.  Overall feeling better daily.  Right-sided weakness improving.  Tolerating current diet.    Objective: Vitals:   05/31/22 0528 05/31/22 1426 05/31/22 2048 06/01/22 0602  BP: (!) 149/74 (!) 139/56 (!) 142/62 (!) 164/70  Pulse: 65 (!) 56 (!) 56 (!) 59  Resp: 16 16 15 17  $ Temp: 98.1 F (36.7 C) (!) 97.5 F (36.4 C) 98.1 F (36.7 C) 98.1 F (36.7 C)  TempSrc: Oral Oral Oral Oral  SpO2: 95% 98% 96% 97%  Weight:      Height:        Intake/Output Summary (Last 24 hours) at 06/01/2022 1048 Last data filed at 06/01/2022 0800 Gross per 24 hour  Intake 900 ml  Output --  Net 900 ml   Filed Weights   05/29/22 1335  Weight: 99 kg    Examination:  General exam: NAD. Respiratory system: Lungs clear to auscultation bilaterally.  No wheezes, no crackles, no rhonchi.  Fair air movement.  Speaking in full sentences. Cardiovascular system: Regular rate rhythm no murmurs rubs or gallops.  No JVD.  Trace bilateral lower extremity edema.  Gastrointestinal system:  Abdomen soft, nontender, nondistended, positive bowel sounds.  No rebound.  No guarding.   Central nervous system: Alert and oriented. No focal neurological deficits. Extremities: Trace bilateral lower extremity edema.  Skin: No rashes, lesions or ulcers Psychiatry: Judgement and insight appear normal. Mood & affect appropriate.     Data Reviewed: I have personally reviewed following labs and imaging studies  CBC: Recent Labs  Lab 05/29/22 1353 05/30/22 0550 05/31/22 0541  WBC 6.2 7.0 5.5  NEUTROABS 5.0  --   --   HGB 13.8 12.7 12.7  HCT 42.5 38.5 39.1  MCV 85.5 85.4 86.5  PLT 280 217 0000000    Basic Metabolic Panel: Recent Labs  Lab 05/29/22 1353 05/30/22 0550 05/31/22 0541 06/01/22 0554  NA 140 138 136 134*  K 4.0 3.7 3.3* 3.6  CL 104 102 99 99  CO2 23 26 27 26  $ GLUCOSE 371* 252* 224* 251*  BUN 13 9 16 16  $ CREATININE 0.91 0.64 0.78 0.65  CALCIUM 8.9 8.7* 8.5* 8.6*  MG  --  2.1  --   --     GFR: Estimated Creatinine Clearance: 79.9 mL/min (by C-G formula based on SCr of 0.65 mg/dL).  Liver Function Tests: Recent Labs  Lab 05/29/22 1353 05/30/22 0550  AST 15 11*  ALT 10 10  ALKPHOS 85 71  BILITOT 0.8 1.0  PROT 8.1 6.6  ALBUMIN 3.4* 3.0*    CBG: Recent  Labs  Lab 05/31/22 0756 05/31/22 1134 05/31/22 1701 05/31/22 2220 06/01/22 0722  GLUCAP 239* 285* 283* 235* 237*     Recent Results (from the past 240 hour(s))  Resp panel by RT-PCR (RSV, Flu A&B, Covid) Anterior Nasal Swab     Status: None   Collection Time: 05/29/22  2:06 PM   Specimen: Anterior Nasal Swab  Result Value Ref Range Status   SARS Coronavirus 2 by RT PCR NEGATIVE NEGATIVE Final    Comment: (NOTE) SARS-CoV-2 target nucleic acids are NOT DETECTED.  The SARS-CoV-2 RNA is generally detectable in upper respiratory specimens during the acute phase of infection. The lowest concentration of SARS-CoV-2 viral copies this assay can detect is 138 copies/mL. A negative result does not  preclude SARS-Cov-2 infection and should not be used as the sole basis for treatment or other patient management decisions. A negative result may occur with  improper specimen collection/handling, submission of specimen other than nasopharyngeal swab, presence of viral mutation(s) within the areas targeted by this assay, and inadequate number of viral copies(<138 copies/mL). A negative result must be combined with clinical observations, patient history, and epidemiological information. The expected result is Negative.  Fact Sheet for Patients:  EntrepreneurPulse.com.au  Fact Sheet for Healthcare Providers:  IncredibleEmployment.be  This test is no t yet approved or cleared by the [place] FDA and  has been authorized for detection and/or diagnosis of SARS-CoV-2 by FDA under an Emergency Use Authorization (EUA). This EUA will remain  in effect (meaning this test can be used) for the duration of the COVID-19 declaration under Section 564(b)(1) of the Act, 21 U.S.C.section 360bbb-3(b)(1), unless the authorization is terminated  or revoked sooner.       Influenza A by PCR NEGATIVE NEGATIVE Final   Influenza B by PCR NEGATIVE NEGATIVE Final    Comment: (NOTE) The Xpert Xpress SARS-CoV-2/FLU/RSV plus assay is intended as an aid in the diagnosis of influenza from Nasopharyngeal swab specimens and should not be used as a sole basis for treatment. Nasal washings and aspirates are unacceptable for Xpert Xpress SARS-CoV-2/FLU/RSV testing.  Fact Sheet for Patients: EntrepreneurPulse.com.au  Fact Sheet for Healthcare Providers: IncredibleEmployment.be  This test is not yet approved or cleared by the [place] FDA and has been authorized for detection and/or diagnosis of SARS-CoV-2 by FDA under an Emergency Use Authorization (EUA). This EUA will remain in effect (meaning this test can be used) for the  duration of the COVID-19 declaration under Section 564(b)(1) of the Act, 21 U.S.C. section 360bbb-3(b)(1), unless the authorization is terminated or revoked.     Resp Syncytial Virus by PCR NEGATIVE NEGATIVE Final    Comment: (NOTE) Fact Sheet for Patients: EntrepreneurPulse.com.au  Fact Sheet for Healthcare Providers: IncredibleEmployment.be  This test is not yet approved or cleared by the [place] FDA and has been authorized for detection and/or diagnosis of SARS-CoV-2 by FDA under an Emergency Use Authorization (EUA). This EUA will remain in effect (meaning this test can be used) for the duration of the COVID-19 declaration under Section 564(b)(1) of the Act, 21 U.S.C. section 360bbb-3(b)(1), unless the authorization is terminated or revoked.  Performed at [facility], [place] [address], [place], [place] [address]   Urine Culture (for pregnant, neutropenic or urologic patients or patients with an indwelling urinary catheter)     Status: Abnormal   Collection Time: 05/29/22  9:13 PM   Specimen: Urine, Catheterized  Result Value Ref Range Status   Specimen Description   Final  URINE, CATHETERIZED Performed at [facility], [place] [address], [place], [place] [address]    Special Requests   Final    NONE Performed at [facility], [place] [address] [place], [place] [address]    Culture (A)  Final    >=100,000 COLONIES/mL KLEBSIELLA PNEUMONIAE Two isolates with different morphologies were identified as the same organism.The most resistant organism was reported. Performed at [facility], [place] [address], [place], [place] [address]    Report Status 06/01/2022 FINAL  Final   Organism ID, Bacteria KLEBSIELLA PNEUMONIAE (A)  Final      Susceptibility   Klebsiella pneumoniae - MIC*    AMPICILLIN >=32 RESISTANT Resistant     CEFAZOLIN <=4 SENSITIVE Sensitive     CEFEPIME <=0.12  SENSITIVE Sensitive     CEFTRIAXONE <=0.25 SENSITIVE Sensitive     CIPROFLOXACIN <=0.25 SENSITIVE Sensitive     GENTAMICIN <=1 SENSITIVE Sensitive     IMIPENEM 1 SENSITIVE Sensitive     NITROFURANTOIN 128 RESISTANT Resistant     TRIMETH/SULFA <=20 SENSITIVE Sensitive     AMPICILLIN/SULBACTAM 8 SENSITIVE Sensitive     PIP/TAZO <=4 SENSITIVE Sensitive     * >=100,000 COLONIES/mL KLEBSIELLA PNEUMONIAE  MRSA Next Gen by PCR, Nasal     Status: Abnormal   Collection Time: 05/30/22  1:45 PM   Specimen: Nasal Mucosa; Nasal Swab  Result Value Ref Range Status   MRSA by PCR Next Gen DETECTED (A) NOT DETECTED Final    Comment: (NOTE) The GeneXpert MRSA Assay (FDA approved for NASAL specimens only), is one component of a comprehensive MRSA colonization surveillance program. It is not intended to diagnose MRSA infection nor to guide or monitor treatment for MRSA infections. Test performance is not FDA approved in patients less than 43 years old. Performed at [facility], [place] [address], [place],  [id number]          Radiology Studies: No results found.      Scheduled Meds:  carvedilol  6.25 mg Oral BID WC   cefadroxil  500 mg Oral BID   enoxaparin (LOVENOX) injection  40 mg Subcutaneous Q24H   fenofibrate  54 mg Oral q morning   furosemide  40 mg Oral Daily   insulin aspart  0-15 Units Subcutaneous TID WC   insulin aspart  6 Units Subcutaneous TID WC   insulin glargine-yfgn  18 Units Subcutaneous Daily   lisinopril  40 mg Oral Daily   pantoprazole  40 mg Oral Q0600   rosuvastatin  20 mg Oral q morning   sodium chloride flush  3 mL Intravenous Q12H   Continuous Infusions:  sodium chloride       LOS: 1 day    Time spent: 35 minutes    Irine Seal, MD [facility]   To contact the attending provider between 7A-7P or the covering provider during after hours 7P-7A, please log into the web site [email or web address] and access using universal  [place] password for that web site. If you do not have the password, please call the hospital operator.  06/01/2022, 10:48 AM

## 2022-06-01 NOTE — Evaluation (Signed)
Occupational Therapy Evaluation Patient Details Name: Amanda Butler MRN: [id number] DOB: 1955-07-16 Today's Date: 06/01/2022   History of Present Illness Patient is a 67 y.o. female who presented to the ED with a 3-day history of worsening shortness of breath from baseline, worsening lower extremity edema, dysuria, worsening urgency and worsening frequency, worsening right upper and right lower extremity weakness with some numbness. Dx of hypertensive urgency (BP 221/107 in ED). PMH:  with medical history significant of hypertension, gastroesophageal reflux disease, morbid obesity, type 2 diabetes, chronic diastolic CHF, chronic venous stasis changes bilateral lower extremities   Clinical Impression   Patient evaluated by Occupational Therapy with no further acute OT needs identified. All education has been completed and the patient has no further questions. Patient is MI at this time at [place] level with patient able to carry over education during session. No signs of LOB or knee buckling during session.  See below for any follow-up Occupational Therapy or equipment needs. OT is signing off. Thank you for this referral.       Recommendations for follow up therapy are one component of a multi-disciplinary discharge planning process, led by the attending physician.  Recommendations may be updated based on patient status, additional functional criteria and insurance authorization.   Follow Up Recommendations  No OT follow up     Assistance Recommended at Discharge None     Functional Status Assessment  Patient has not had a recent decline in their functional status  Equipment Recommendations  None recommended by OT       Precautions / Restrictions Precautions Precautions: Other (comment) Precaution Comments: denies falls in past 6 months; open wounds BLEs Restrictions Weight Bearing Restrictions: No      Mobility Bed Mobility               General bed mobility comments: patient  was up in recliner and returned to the same.    Transfers Overall transfer level: Modified independent Equipment used: Rolling walker (2 wheels)                      Balance Overall balance assessment: Mild deficits observed, not formally tested             ADL either performed or assessed with clinical judgement   ADL Overall ADL's : Modified independent           General ADL Comments: patient was able to engage in transfers, functional mobility, LB dressing and toileting tasks at RW level with MI. patient reported having R knee buckle on her this AM with slight loff of balance with patient able to recover. patient was educated on picking feet up when turning v.s turning walker and twisting on knees. patient verbalized understanding and was able to carryover strategy and teach back later in session. patient endorses that she is at her baseline for ADLs at this time. patient was encouraged to look for walker tray and to continue use of walker at home v.s. cane and furniture walking. patient verbalized understanding.     Vision Baseline Vision/History: 1 Wears glasses Patient Visual Report: No change from baseline              Pertinent Vitals/Pain Pain Assessment Pain Assessment: No/denies pain     Hand Dominance Right   Extremity/Trunk Assessment Upper Extremity Assessment Upper Extremity Assessment: RUE deficits/detail;LUE deficits/detail RUE Deficits / Details: numbness in fingers and up into forearm at baseline with noted signs of arthrits in all  digit joints. patient ROM WFL. patient reported some times having increased difficulty with grasping objects but not observed during session. RUE Coordination: decreased fine motor LUE Deficits / Details: numbness in fingers at baseline with noted signs of arthrits in all digit joints. patient ROM WFL. patient reported some times having increased difficulty with grasping objects but not observed during session. LUE  Coordination: decreased fine motor   Lower Extremity Assessment Lower Extremity Assessment: Defer to PT evaluation   Cervical / Trunk Assessment Cervical / Trunk Assessment: Kyphotic   Communication Communication Communication: No difficulties   Cognition Arousal/Alertness: Awake/alert Behavior During Therapy: WFL for tasks assessed/performed Overall Cognitive Status: Within Functional Limits for tasks assessed       General Comments: talktative but appropirate                Home Living Family/patient expects to be discharged to:: Private residence Living Arrangements: Alone Available Help at Discharge: Family;Available PRN/intermittently Type of Home: House Home Access: Ramped entrance     Home Layout: One level     Bathroom Shower/Tub: Occupational psychologist: Standard     Home Equipment: Conservation officer, nature (2 wheels);Cane - single point;BSC/3in1;Grab bars - tub/shower;Shower seat          Prior Functioning/Environment Prior Level of Function : Independent/Modified Independent;Driving             Mobility Comments: cane at home, RW when going out ADLs Comments: mod I                 OT Goals(Current goals can be found in the care plan section) Acute Rehab OT Goals OT Goal Formulation: All assessment and education complete, DC therapy  OT Frequency:         AM-PAC OT "6 Clicks" Daily Activity     Outcome Measure Help from another person eating meals?: None Help from another person taking care of personal grooming?: None Help from another person toileting, which includes using toliet, bedpan, or urinal?: None Help from another person bathing (including washing, rinsing, drying)?: None Help from another person to put on and taking off regular upper body clothing?: None Help from another person to put on and taking off regular lower body clothing?: None 6 Click Score: 24   End of Session Equipment Utilized During Treatment: Rolling  walker (2 wheels);Gait belt Nurse Communication: Mobility status  Activity Tolerance: Patient tolerated treatment well Patient left: in chair;with chair alarm set  OT Visit Diagnosis: Muscle weakness (generalized) (M62.81)                Time: 1356-1430 OT Time Calculation (min): 34 min Charges:  OT General Charges $OT Visit: 1 Visit OT Evaluation $OT Eval Low Complexity: 1 Low OT Treatments $Self Care/Home Management : 8-22 mins  Rennie Plowman, MS Acute Rehabilitation Department Office# [phone]   Willa Rough 06/01/2022, 4:10 PM

## 2022-06-02 ENCOUNTER — Other Ambulatory Visit (HOSPITAL_COMMUNITY): Payer: Self-pay

## 2022-06-02 DIAGNOSIS — I5032 Chronic diastolic (congestive) heart failure: Secondary | ICD-10-CM | POA: Diagnosis not present

## 2022-06-02 DIAGNOSIS — L97921 Non-pressure chronic ulcer of unspecified part of left lower leg limited to breakdown of skin: Secondary | ICD-10-CM | POA: Diagnosis not present

## 2022-06-02 DIAGNOSIS — G8929 Other chronic pain: Secondary | ICD-10-CM | POA: Diagnosis not present

## 2022-06-02 DIAGNOSIS — R0602 Shortness of breath: Secondary | ICD-10-CM | POA: Diagnosis not present

## 2022-06-02 DIAGNOSIS — K219 Gastro-esophageal reflux disease without esophagitis: Secondary | ICD-10-CM | POA: Diagnosis not present

## 2022-06-02 DIAGNOSIS — N3000 Acute cystitis without hematuria: Secondary | ICD-10-CM | POA: Diagnosis not present

## 2022-06-02 DIAGNOSIS — E7800 Pure hypercholesterolemia, unspecified: Secondary | ICD-10-CM | POA: Diagnosis not present

## 2022-06-02 DIAGNOSIS — L97911 Non-pressure chronic ulcer of unspecified part of right lower leg limited to breakdown of skin: Secondary | ICD-10-CM | POA: Diagnosis not present

## 2022-06-02 DIAGNOSIS — M5441 Lumbago with sciatica, right side: Secondary | ICD-10-CM | POA: Diagnosis not present

## 2022-06-02 DIAGNOSIS — I160 Hypertensive urgency: Secondary | ICD-10-CM | POA: Diagnosis not present

## 2022-06-02 DIAGNOSIS — E119 Type 2 diabetes mellitus without complications: Secondary | ICD-10-CM | POA: Diagnosis not present

## 2022-06-02 LAB — CBC
HCT: 39.2 % (ref 36.0–46.0)
Hemoglobin: 12.7 g/dL (ref 12.0–15.0)
MCH: 28 pg (ref 26.0–34.0)
MCHC: 32.4 g/dL (ref 30.0–36.0)
MCV: 86.5 fL (ref 80.0–100.0)
Platelets: 214 10*3/uL (ref 150–400)
RBC: 4.53 MIL/uL (ref 3.87–5.11)
RDW: 13.6 % (ref 11.5–15.5)
WBC: 6 10*3/uL (ref 4.0–10.5)
nRBC: 0 % (ref 0.0–0.2)

## 2022-06-02 LAB — BASIC METABOLIC PANEL
Anion gap: 12 (ref 5–15)
BUN: 17 mg/dL (ref 8–23)
CO2: 25 mmol/L (ref 22–32)
Calcium: 8.6 mg/dL — ABNORMAL LOW (ref 8.9–10.3)
Chloride: 98 mmol/L (ref 98–111)
Creatinine, Ser: 0.75 mg/dL (ref 0.44–1.00)
GFR, Estimated: >60 mL/min (ref >60)
Glucose, Bld: 240 mg/dL — ABNORMAL HIGH (ref 70–99)
Potassium: 3.7 mmol/L (ref 3.5–5.1)
Sodium: 135 mmol/L (ref 135–145)

## 2022-06-02 LAB — GLUCOSE, CAPILLARY: Glucose-Capillary: 237 mg/dL — ABNORMAL HIGH (ref 70–99)

## 2022-06-02 MED ORDER — LISINOPRIL 40 MG PO TABS
40.0000 mg | ORAL_TABLET | Freq: Every day | ORAL | 1 refills | Status: DC
  Filled 2022-06-02: qty 30, 30d supply, fill #0

## 2022-06-02 MED ORDER — FUROSEMIDE 40 MG PO TABS
40.0000 mg | ORAL_TABLET | Freq: Every day | ORAL | 1 refills | Status: DC
  Filled 2022-06-02: qty 30, 30d supply, fill #0

## 2022-06-02 MED ORDER — GLIMEPIRIDE 4 MG PO TABS
8.0000 mg | ORAL_TABLET | ORAL | 1 refills | Status: AC
  Filled 2022-06-02: qty 60, 30d supply, fill #0

## 2022-06-02 MED ORDER — INSULIN GLARGINE-YFGN 100 UNIT/ML ~~LOC~~ SOLN
20.0000 [IU] | Freq: Every day | SUBCUTANEOUS | Status: DC
  Administered 2022-06-02: 20 [IU] via SUBCUTANEOUS
  Filled 2022-06-02: qty 0.2

## 2022-06-02 MED ORDER — CEFADROXIL 500 MG PO CAPS
500.0000 mg | ORAL_CAPSULE | Freq: Two times a day (BID) | ORAL | 0 refills | Status: AC
  Filled 2022-06-02: qty 2, 1d supply, fill #0

## 2022-06-02 MED ORDER — PANTOPRAZOLE SODIUM 40 MG PO TBEC
40.0000 mg | DELAYED_RELEASE_TABLET | Freq: Every day | ORAL | 1 refills | Status: AC
  Filled 2022-06-02: qty 30, 30d supply, fill #0

## 2022-06-02 MED ORDER — INSULIN ASPART 100 UNIT/ML IJ SOLN: 8.0000 [IU] | Freq: Three times a day (TID) | INTRAMUSCULAR | Status: DC

## 2022-06-02 NOTE — Progress Notes (Signed)
PT Cancellation Note / Screen  Patient Details Name: MERCEDESE ACOB MRN: [id number] DOB: [birth year]   Cancelled Treatment:    Reason Eval/Treat Not Completed: PT screened, no needs identified, will sign off  PT reordered.  Pt seen by OT and OT reports no PT needs at this time.   Myrtis Hopping [place] 06/02/2022, 8:03 AM Arlyce Dice, DPT Physical Therapist Acute Rehabilitation Services Preferred contact method: Secure Chat Weekend Pager Only: [phone] Office: [phone]

## 2022-06-02 NOTE — Progress Notes (Signed)
Reviewed discharge instructions with patient.  Patient verbalized understanding.  Patient reports that she "Twisted her knee" and almost fell.  MD notified.  Since the incident both PT/OT has evaluated the patient and did not report any home needs.  Called the Outpatient pharmacy and asked them to drop off meds to the bed.  Patient will be transported downstairs once she receives her meds.

## 2022-06-02 NOTE — Discharge Summary (Signed)
Physician Discharge Summary  Amanda Butler [id number] DOB: 03-03-1956 DOA: 05/29/2022  PCP: Gaynelle Arabian, MD  Admit date: 05/29/2022 Discharge date: 06/02/2022  Time spent: 55 minutes  Recommendations for Outpatient Follow-up:  Follow-up with Gaynelle Arabian, MD as scheduled on June 09, 2022 at 3 PM.  On follow-up patient will need a basic metabolic profile done to follow-up on electrolytes and renal function.  Patient's blood pressure needs to be reassessed on follow-up.  Patient's diabetes will also need to be reassessed on follow-up.   Discharge Diagnoses:  Principal Problem:   Hypertensive urgency Active Problems:   Bilateral lower extremity edema   Diabetes mellitus type 2, diet-controlled (HCC)   Stasis dermatitis of both legs   Asthma   GERD (gastroesophageal reflux disease)   Morbid obesity due to excess calories (HCC)   High cholesterol   Chronic diastolic CHF (congestive heart failure) (HCC)   Chronic right-sided low back pain with right-sided sciatica   Ulcers of both lower extremities, limited to breakdown of skin (HCC)   Postural dizziness with presyncope   Urinary tract infection without hematuria   SOB (shortness of breath)   Discharge Condition: Stable and improved.  Diet recommendation: Carb modified diet  Filed Weights   05/29/22 1335  Weight: 99 kg    History of present illness:  : Amanda Butler is a 67 y.o. female with medical history significant of hypertension, gastroesophageal reflux disease, morbid obesity, type 2 diabetes, chronic diastolic CHF, chronic venous stasis changes bilateral lower extremities presenting to the ED with a 3-day history of worsening shortness of breath from baseline, worsening lower extremity edema, dysuria, worsening urgency and worsening frequency, worsening right upper and right lower extremity weakness with some numbness.  Patient although states has chronic sciatica.  Patient also states has some chronic  numbness in her legs.  Patient does endorse some nausea, 1 bout of soft mushy stool 1 day prior to admission.  Patient denies any fevers, no chills, no abdominal pain, no melena, no hematemesis, no hematochezia.  Patient does complain of a bout of a spinning sensation 1 day prior to admission.  Patient however also does state does have a history of chronic dizziness and some lightheadedness.  Patient did state had some difficulty ambulating with her walker and felt if she did not use a walker she would lose her balance.  Patient with no significant change with vaginal abnormalities.  No speech impediments.  Patient also does endorse systolic blood pressures in the 170s at home despite being compliant with her antihypertensives.   ED Course: Patient seen in the ED on presentation noted to have blood pressure of 221/107.  Also noted with a heart rate of 105.  Per ED physician when speaking with patient patient's O2 sats dropped in the 80s and patient placed on 2 L nasal cannula.  Comprehensive metabolic profile noted a glucose of 371, albumin of 3.4 otherwise within normal limits.  BNP noted at 81.3.  High-sensitivity troponin noted at 10.  CBC unremarkable.  Influenza A and B PCR negative.  RSV PCR negative.  SARS coronavirus 2 PCR negative.  Urinalysis with greater than 500 glucose, moderate hemoglobin, trace leukocytes, 5 ketones, nitrite negative, many bacteria, WBC > 50.  Urine cultures not obtained.  EKG with right bundle blanch block with no significant change from prior EKG. -CT head with no acute abnormalities. -MRI offered however patient refused due to claustrophobia. During my assessment patient noted to have sats consistently greater than 92% on  room air.  Hospital Course:  #1 hypertensive urgency -Patient presented with a 3-day history of worsening shortness of breath, worsening lower extremity edema, some right-sided weakness worse from baseline, worsening lower extremity edema, an episode of  lightheadedness/spinning sensation. -Patient noted in the ED on presentation to have a blood pressure of 221/107. -Patient endorsed compliance with medications. -Patient given labetalol with some improvement with blood pressure on initial presentation to the ED. -Patient placed back on home regimen Coreg 6.25 mg twice daily, home regimen Lasix 40 mg daily.  Patient's home regimen lisinopril was adjusted and uptitrated to 40 mg daily and patient maintained on IV hydralazine as needed.   -Blood pressure was better controlled on this regimen.   -Outpatient follow-up with PCP.     2.  GERD -Patient placed on a PPI during the hospitalization.   3.  Chronic diastolic CHF -Patient with chronic lower extremity edema, with chronic venous stasis changes. -Patient not hypoxic with sats of 97% on room air. -Respiratory exam without any crackles.  Chest x-ray negative for any acute infiltrate. -BNP within normal limits. -Patient likely euvolemic at baseline throughout the hospitalization.. -Patient maintained on home regimen Lasix, Coreg.    #4.  Right-sided weakness/numbness -Patient with some complaints of slightly worsening chronic right-sided weakness and numbness on presentation. -Patient did endorse history of sciatica and chronic numbness in her feet. -Likely secondary to hypertensive urgency and UTI. -Head CT done negative for any acute abnormalities. -Patient currently refusing MRI brain due to claustrophobia and states needs sedation. -Patient with no focal neurological deficits noted on physical exam. -Improved daily during the hospitalization.   -Urinalysis concerning for UTI and urine cultures positive for Klebsiella pneumonia.   -Patient maintained on IV antibiotics once urine cultures and sensitivities are finalized transition to oral Duricef and will be discharged home on 1 more day to complete a 5-day course of treatment.  -Patient seen by PT/OT and no needs noted.   -Outpatient  follow-up with PCP.    5.  Klebsiella pneumonia UTI -Patient with symptoms of dysuria, worsening urgency, worsening frequency on presentation.. -Urinalysis concerning for UTI. -Urine cultures with > 100,000 colonies of Klebsiella pneumonia sensitive to the cephalosporins, quinolones, gentamicin, imipenem, Bactrim, Unasyn and Zosyn.  Resistant to ampicillin and nitrofurantoin.   -Patient placed on IV Rocephin subsequently transition to [facility] and will be discharged on 1 more day of oral antibiotics to complete a 5-day course of treatment.   -Patient improved clinically and will be discharged in stable and improved condition.     6.  Diabetes is mellitus type II -Hemoglobin A1c 9.8  (05/30/2022 ). -Patient's oral hypoglycemic agents were held during the hospitalization and patient placed on long-acting Semglee, sliding scale insulin, meal coverage NovoLog.   -Patient be discharged on increased dose of home regimen Amaryl and to resume home regimen Prandin.  -Outpatient follow-up with PCP.    7.  Hypokalemia -Repleted, potassium at 3.7 by day of discharge.   8.  Hyperlipidemia -Patient maintained on home regimen statin.    9.  Chronic bilateral lower extremity edema/chronic venous stasis changes/chronic ulcers bilateral lower extremity limited with skin breakdown, POA -WOC consulted and placed orders for topical treatments for skin breakdown.     10.  Obesity -Patient states has had 100 pound weight loss over the past 2 years. -Patient commended on weight loss. -Lifestyle modification. -Outpatient follow-up with PCP.      Procedures: CT head 05/29/2022 Chest x-ray 05/29/2022   Consultations: None  Discharge Exam: Vitals:   06/01/22 1941 06/02/22 0611  BP: (!) 157/70 (!) 140/66  Pulse: (!) 58 61  Resp: 16 16  Temp: 98.3 F (36.8 C) 98.2 F (36.8 C)  SpO2: 97% 97%    General: NAD Cardiovascular: RRR no murmurs rubs or gallops.  No JVD.  Trace bilateral lower extremity  edema. Respiratory: Lungs clear to auscultation bilaterally.  No wheezes, no crackles, no rhonchi.  Fair air movement.  Speaking in full sentences.  Discharge Instructions   Discharge Instructions     Diet Carb Modified   Complete by: As directed    Discharge wound care:   Complete by: As directed    Wound care  Daily      Comments: Apply xeroform gauze to bilat legs Q day, then cover with ABD pads and kerlex  05/30/22 1151   Increase activity slowly   Complete by: As directed       Allergies as of 06/02/2022       Reactions   Aspirin Hives, Shortness Of Breath   Bee Venom Anaphylaxis   Ibuprofen Anaphylaxis, Swelling   Swelling of throat   Iodine Shortness Of Breath, Rash   Metformin And Related Swelling, Other (See Comments)   Patient reports flu-like symptoms and fatigue Swelling of throat   Nsaids Anaphylaxis, Hives, Swelling, Other (See Comments)   Swelling of throat- Tylenol is tolerated   Okra Shortness Of Breath, Itching, Rash, Other (See Comments)   Turnip greens, collard greens, pickles, and squash   Other Shortness Of Breath, Other (See Comments)   Reaction to "X-ray dye"   Shellfish Allergy Anaphylaxis   Actos [pioglitazone] Other (See Comments)   Erratic heartbeat   Celecoxib Other (See Comments)   Unknown reaction   Latex Hives   Morphine And Related Other (See Comments)   Childhood allergy (67 years old) , pt got sicker , temp went up, dr gave her the wrong medication   Ciprofloxacin Palpitations   Joint pain        Medication List     STOP taking these medications    triamterene-hydrochlorothiazide 75-50 MG tablet Commonly known as: MAXZIDE       TAKE these medications    acetaminophen 500 MG tablet Commonly known as: TYLENOL Take 1,000 mg by mouth 2 (two) times daily as needed (pain).   albuterol (2.5 MG/3ML) 0.083% nebulizer solution Commonly known as: PROVENTIL Take 2.5 mg by nebulization See admin instructions. Take 2.5 mg by  mouth 1-2 times a day as needed for wheezing and SOB   albuterol 108 (90 Base) MCG/ACT inhaler Commonly known as: VENTOLIN HFA Inhale 2 puffs into the lungs 2 (two) times daily as needed for shortness of breath or wheezing.   BACTROBAN EX Apply 1 Application topically daily.   carvedilol 6.25 MG tablet Commonly known as: COREG Take 1 tablet (6.25 mg total) by mouth 2 (two) times daily with a meal.   cefadroxil 500 MG capsule Commonly known as: DURICEF Take 1 capsule (500 mg total) by mouth 2 (two) times daily for 1 day.   eucerin cream Apply 1 application  topically daily as needed for dry skin.   fenofibrate 54 MG tablet Take 1 tablet (54 mg total) by mouth daily. What changed: when to take this   furosemide 40 MG tablet Commonly known as: Lasix Take 1 tablet (40 mg total) by mouth daily.   glimepiride 4 MG tablet Commonly known as: Amaryl Take 2 tablets (8 mg total) by mouth  every morning. What changed: how much to take   lisinopril 40 MG tablet Commonly known as: ZESTRIL Take 1 tablet (40 mg total) by mouth daily. What changed:  medication strength how much to take   NASAL SPRAY NA Place 2 sprays into the nose 2 (two) times daily as needed (asthma, runny nose).   pantoprazole 40 MG tablet Commonly known as: PROTONIX Take 1 tablet (40 mg total) by mouth daily at 6 (six) AM. Start taking on: June 03, 2022   repaglinide 1 MG tablet Commonly known as: PRANDIN Take 1 mg by mouth daily.   rosuvastatin 20 MG tablet Commonly known as: CRESTOR Take 20 mg by mouth every morning.               Discharge Care Instructions  (From admission, onward)           Start     Ordered   06/02/22 0000  Discharge wound care:       Comments: Wound care  Daily      Comments: Apply xeroform gauze to bilat legs Q day, then cover with ABD pads and kerlex  05/30/22 1151   06/02/22 1037           Allergies  Allergen Reactions   Aspirin Hives and Shortness  Of Breath   Bee Venom Anaphylaxis   Ibuprofen Anaphylaxis and Swelling    Swelling of throat   Iodine Shortness Of Breath and Rash   Metformin And Related Swelling and Other (See Comments)    Patient reports flu-like symptoms and fatigue Swelling of throat   Nsaids Anaphylaxis, Hives, Swelling and Other (See Comments)    Swelling of throat- Tylenol is tolerated   Okra Shortness Of Breath, Itching, Rash and Other (See Comments)    Turnip greens, collard greens, pickles, and squash   Other Shortness Of Breath and Other (See Comments)    Reaction to "X-ray dye"   Shellfish Allergy Anaphylaxis   Actos [Pioglitazone] Other (See Comments)    Erratic heartbeat   Celecoxib Other (See Comments)    Unknown reaction   Latex Hives   Morphine And Related Other (See Comments)    Childhood allergy (67 years old) , pt got sicker , temp went up, dr gave her the wrong medication   Ciprofloxacin Palpitations    Joint pain    Follow-up Information     Gaynelle Arabian, MD Follow up on 06/09/2022.   Specialty: Family Medicine Why: f/u at 3pms You have been scheduled for a hospital follow appointment with Dr. Marisue Humble for Monday Feb 26 at 3:00pm. Please keep this appointment because it is important for your aftercare. If you have any questions of concerns please call the number listed above. Contact information: [address] [place] [address] [phone]                  The results of significant diagnostics from this hospitalization (including imaging, microbiology, ancillary and laboratory) are listed below for reference.    Significant Diagnostic Studies: CT Head Wo Contrast  Result Date: 05/29/2022 CLINICAL DATA:  Headache EXAM: CT HEAD WITHOUT CONTRAST TECHNIQUE: Contiguous axial images were obtained from the base of the skull through the vertex without intravenous contrast. RADIATION DOSE REDUCTION: This exam was performed according to the departmental  dose-optimization program which includes automated exposure control, adjustment of the mA and/or kV according to patient size and/or use of iterative reconstruction technique. COMPARISON:  CT head 02/24/2022 FINDINGS: Brain: No  evidence of acute infarction, hemorrhage, hydrocephalus, extra-axial collection or mass lesion/mass effect. Vascular: No hyperdense vessel or unexpected calcification. Skull: Normal. Negative for fracture or focal lesion. Sinuses/Orbits: No acute finding. Other: None. IMPRESSION: No acute intracranial abnormality. Electronically Signed   By: Ronney Asters M.D.   On: 05/29/2022 15:49   DG Chest 2 View  Result Date: 05/29/2022 CLINICAL DATA:  Shortness of breath EXAM: CHEST - 2 VIEW COMPARISON:  12/29/2021 FINDINGS: The heart size and mediastinal contours are within normal limits. Both lungs are clear. The visualized skeletal structures are unremarkable. Mild degenerative changes along the spine. Degenerative changes of the shoulders. IMPRESSION: No acute cardiopulmonary disease Electronically Signed   By: Jill Side M.D.   On: 05/29/2022 13:50    Microbiology: Recent Results (from the past 240 hour(s))  Resp panel by RT-PCR (RSV, Flu A&B, Covid) Anterior Nasal Swab     Status: None   Collection Time: 05/29/22  2:06 PM   Specimen: Anterior Nasal Swab  Result Value Ref Range Status   SARS Coronavirus 2 by RT PCR NEGATIVE NEGATIVE Final    Comment: (NOTE) SARS-CoV-2 target nucleic acids are NOT DETECTED.  The SARS-CoV-2 RNA is generally detectable in upper respiratory specimens during the acute phase of infection. The lowest concentration of SARS-CoV-2 viral copies this assay can detect is 138 copies/mL. A negative result does not preclude SARS-Cov-2 infection and should not be used as the sole basis for treatment or other patient management decisions. A negative result may occur with  improper specimen collection/handling, submission of specimen other than nasopharyngeal  swab, presence of viral mutation(s) within the areas targeted by this assay, and inadequate number of viral copies(<138 copies/mL). A negative result must be combined with clinical observations, patient history, and epidemiological information. The expected result is Negative.  Fact Sheet for Patients:  EntrepreneurPulse.com.au  Fact Sheet for Healthcare Providers:  IncredibleEmployment.be  This test is no t yet approved or cleared by the [place] FDA and  has been authorized for detection and/or diagnosis of SARS-CoV-2 by FDA under an Emergency Use Authorization (EUA). This EUA will remain  in effect (meaning this test can be used) for the duration of the COVID-19 declaration under Section 564(b)(1) of the Act, 21 U.S.C.section 360bbb-3(b)(1), unless the authorization is terminated  or revoked sooner.       Influenza A by PCR NEGATIVE NEGATIVE Final   Influenza B by PCR NEGATIVE NEGATIVE Final    Comment: (NOTE) The Xpert Xpress SARS-CoV-2/FLU/RSV plus assay is intended as an aid in the diagnosis of influenza from Nasopharyngeal swab specimens and should not be used as a sole basis for treatment. Nasal washings and aspirates are unacceptable for Xpert Xpress SARS-CoV-2/FLU/RSV testing.  Fact Sheet for Patients: EntrepreneurPulse.com.au  Fact Sheet for Healthcare Providers: IncredibleEmployment.be  This test is not yet approved or cleared by the [place] FDA and has been authorized for detection and/or diagnosis of SARS-CoV-2 by FDA under an Emergency Use Authorization (EUA). This EUA will remain in effect (meaning this test can be used) for the duration of the COVID-19 declaration under Section 564(b)(1) of the Act, 21 U.S.C. section 360bbb-3(b)(1), unless the authorization is terminated or revoked.     Resp Syncytial Virus by PCR NEGATIVE NEGATIVE Final    Comment: (NOTE) Fact Sheet for  Patients: EntrepreneurPulse.com.au  Fact Sheet for Healthcare Providers: IncredibleEmployment.be  This test is not yet approved or cleared by the [place] FDA and has been authorized for detection and/or diagnosis of SARS-CoV-2  by FDA under an Emergency Use Authorization (EUA). This EUA will remain in effect (meaning this test can be used) for the duration of the COVID-19 declaration under Section 564(b)(1) of the Act, 21 U.S.C. section 360bbb-3(b)(1), unless the authorization is terminated or revoked.  Performed at [facility], [place] [address] [place], [place] [address]   Urine Culture (for pregnant, neutropenic or urologic patients or patients with an indwelling urinary catheter)     Status: Abnormal   Collection Time: 05/29/22  9:13 PM   Specimen: Urine, Catheterized  Result Value Ref Range Status   Specimen Description   Final    URINE, CATHETERIZED Performed at [facility] [place] [address], [place], [place] [address]    Special Requests   Final    NONE Performed at [facility], [place] [address], [place], [place] [address]    Culture (A)  Final    >=100,000 COLONIES/mL KLEBSIELLA PNEUMONIAE Two isolates with different morphologies were identified as the same organism.The most resistant organism was reported. Performed at [facility], [place] [address] [place], [place] [address]    Report Status 06/01/2022 FINAL  Final   Organism ID, Bacteria KLEBSIELLA PNEUMONIAE (A)  Final      Susceptibility   Klebsiella pneumoniae - MIC*    AMPICILLIN >=32 RESISTANT Resistant     CEFAZOLIN <=4 SENSITIVE Sensitive     CEFEPIME <=0.12 SENSITIVE Sensitive     CEFTRIAXONE <=0.25 SENSITIVE Sensitive     CIPROFLOXACIN <=0.25 SENSITIVE Sensitive     GENTAMICIN <=1 SENSITIVE Sensitive     IMIPENEM 1 SENSITIVE Sensitive     NITROFURANTOIN 128 RESISTANT Resistant     TRIMETH/SULFA <=20  SENSITIVE Sensitive     AMPICILLIN/SULBACTAM 8 SENSITIVE Sensitive     PIP/TAZO <=4 SENSITIVE Sensitive     * >=100,000 COLONIES/mL KLEBSIELLA PNEUMONIAE  MRSA Next Gen by PCR, Nasal     Status: Abnormal   Collection Time: 05/30/22  1:45 PM   Specimen: Nasal Mucosa; Nasal Swab  Result Value Ref Range Status   MRSA by PCR Next Gen DETECTED (A) NOT DETECTED Final    Comment: (NOTE) The GeneXpert MRSA Assay (FDA approved for NASAL specimens only), is one component of a comprehensive MRSA colonization surveillance program. It is not intended to diagnose MRSA infection nor to guide or monitor treatment for MRSA infections. Test performance is not FDA approved in patients less than 34 years old. Performed at [facility], [place] [address], [place],  [address]      Labs: Basic Metabolic Panel: Recent Labs  Lab 05/29/22 1353 05/30/22 0550 05/31/22 0541 06/01/22 0554 06/02/22 0459  NA 140 138 136 134* 135  K 4.0 3.7 3.3* 3.6 3.7  CL 104 102 99 99 98  CO2 23 26 27 26 25  $ GLUCOSE 371* 252* 224* 251* 240*  BUN 13 9 16 16 17  $ CREATININE 0.91 0.64 0.78 0.65 0.75  CALCIUM 8.9 8.7* 8.5* 8.6* 8.6*  MG  --  2.1  --   --   --    Liver Function Tests: Recent Labs  Lab 05/29/22 1353 05/30/22 0550  AST 15 11*  ALT 10 10  ALKPHOS 85 71  BILITOT 0.8 1.0  PROT 8.1 6.6  ALBUMIN 3.4* 3.0*   No results for input(s): "LIPASE", "AMYLASE" in the last 168 hours. No results for input(s): "AMMONIA" in the last 168 hours. CBC: Recent Labs  Lab 05/29/22 1353 05/30/22 0550 05/31/22 0541  06/02/22 0459  WBC 6.2 7.0 5.5 6.0  NEUTROABS 5.0  --   --   --   HGB 13.8 12.7 12.7 12.7  HCT 42.5 38.5 39.1 39.2  MCV 85.5 85.4 86.5 86.5  PLT 280 217 204 214   Cardiac Enzymes: No results for input(s): "CKTOTAL", "CKMB", "CKMBINDEX", "TROPONINI" in the last 168 hours. BNP: BNP (last 3 results) Recent Labs    10/15/21 1047 12/29/21 1745 05/29/22 1353  BNP 78.6 73.0 81.3     ProBNP (last 3 results) No results for input(s): "PROBNP" in the last 8760 hours.  CBG: Recent Labs  Lab 06/01/22 0722 06/01/22 1143 06/01/22 1543 06/01/22 2300 06/02/22 0712  GLUCAP 237* 274* 273* 309* 237*       Signed:  Irine Seal MD.  [facility] 06/02/2022, 10:51 AM

## 2022-06-02 NOTE — TOC Transition Note (Addendum)
Transition of Care [facility]) - CM/SW Discharge Note   Patient Details  Name: Amanda Butler MRN: [id number] Date of Birth: 1955/07/12  [facility] CM/SW Contact:  Angelita Ingles, RN Phone Number:[phone]  06/02/2022, 10:39 AM   Clinical Narrative:    Per MD patient is being discharged today but concerned about follow up because patient states that she has been unable to set up follow up appointments after previous hospital discharges . CM called office of Dr. Gaynelle Arabian the patients PCP. Appointment has been scheduled for 1 wk. Visit information has been added to AVS advising patient to keep appointment. No other needs noted at this time. TOC will sign off         Patient Goals and CMS Choice      Discharge Placement                         Discharge Plan and Services Additional resources added to the After Visit Summary for                                       Social Determinants of Health (SDOH) Interventions SDOH Screenings   Food Insecurity: Food Insecurity Present (05/30/2022)  Housing: Low Risk  (05/30/2022)  Transportation Needs: No Transportation Needs (05/30/2022)  Utilities: At Risk (05/30/2022)  Depression (PHQ2-9): Low Risk  (02/25/2021)  Tobacco Use: Low Risk  (06/01/2022)     Readmission Risk Interventions     No data to display

## 2022-06-04 ENCOUNTER — Other Ambulatory Visit: Payer: Self-pay

## 2022-06-04 ENCOUNTER — Emergency Department (HOSPITAL_COMMUNITY)
Admission: EM | Admit: 2022-06-04 | Discharge: 2022-06-04 | Disposition: A | Discharge status: Home / Self Care | Payer: Medicare HMO | Attending: Emergency Medicine | Admitting: Emergency Medicine

## 2022-06-04 ENCOUNTER — Emergency Department (HOSPITAL_COMMUNITY): Payer: Medicare HMO

## 2022-06-04 DIAGNOSIS — Y92009 Unspecified place in unspecified non-institutional (private) residence as the place of occurrence of the external cause: Secondary | ICD-10-CM | POA: Diagnosis not present

## 2022-06-04 DIAGNOSIS — Z91040 Latex allergy status: Secondary | ICD-10-CM | POA: Diagnosis not present

## 2022-06-04 DIAGNOSIS — S5001XA Contusion of right elbow, initial encounter: Secondary | ICD-10-CM | POA: Insufficient documentation

## 2022-06-04 DIAGNOSIS — W19XXXA Unspecified fall, initial encounter: Secondary | ICD-10-CM

## 2022-06-04 DIAGNOSIS — M25521 Pain in right elbow: Secondary | ICD-10-CM | POA: Diagnosis not present

## 2022-06-04 DIAGNOSIS — W1830XA Fall on same level, unspecified, initial encounter: Secondary | ICD-10-CM | POA: Insufficient documentation

## 2022-06-04 DIAGNOSIS — S8001XA Contusion of right knee, initial encounter: Secondary | ICD-10-CM | POA: Diagnosis not present

## 2022-06-04 DIAGNOSIS — M25561 Pain in right knee: Secondary | ICD-10-CM | POA: Diagnosis not present

## 2022-06-04 DIAGNOSIS — M25511 Pain in right shoulder: Secondary | ICD-10-CM | POA: Diagnosis not present

## 2022-06-04 NOTE — ED Triage Notes (Signed)
Mechanical fall at home. Pain to right shoulder elbow, and right knee Denies loc, denies hitting head, denies blood thinner usage.

## 2022-06-04 NOTE — ED Provider Triage Note (Signed)
Emergency Medicine Provider Triage Evaluation Note  ELORA BERTOLET , a 67 y.o. female  was evaluated in triage.  Pt complains of right shoulder, right elbow, right knee pain after a mechanical fall.  Patient was able to get up herself after the fall.  Denies hitting head or LOC.  No nausea or vomiting headache or dizziness.  Review of Systems  Positive: As above Negative: As above  Physical Exam  BP 127/85   Pulse 96   Temp 98 F (36.7 C)   Resp 20   SpO2 100%  Gen:   Awake, no distress   Resp:  Normal effort  MSK:   Moves extremities without difficulty  Other:    Medical Decision Making  Medically screening exam initiated at 5:48 PM.  Appropriate orders placed.  JACQUILINE PURINTON was informed that the remainder of the evaluation will be completed by another provider, this initial triage assessment does not replace that evaluation, and the importance of remaining in the ED until their evaluation is complete.    Rex Kras, [place] 06/05/22 [phone]

## 2022-06-04 NOTE — Discharge Instructions (Signed)
Return for any problem.  ?

## 2022-06-04 NOTE — ED Provider Notes (Signed)
[place] EMERGENCY DEPARTMENT AT [facility] Provider Note   CSN: [id number] Arrival date & time: 06/04/22  1652     History  Chief Complaint  Patient presents with   Lytle Michaels    Amanda Butler is a 67 y.o. female.  67 year old female with prior medical history as detailed below presents for evaluation.  Patient reports a mechanical fall at home while she was ambulating with her walker.  She complains of pain to the right shoulder, right elbow, and right knee.  She denies head injury or LOC.  She denies neck pain.  She declines pain medications on evaluation.  The history is provided by the patient and medical records.       Home Medications Prior to Admission medications   Medication Sig Start Date End Date Taking? Authorizing Provider  acetaminophen (TYLENOL) 500 MG tablet Take 1,000 mg by mouth 2 (two) times daily as needed (pain).    [provider]  albuterol (PROVENTIL) (2.5 MG/3ML) 0.083% nebulizer solution Take 2.5 mg by nebulization See admin instructions. Take 2.5 mg by mouth 1-2 times a day as needed for wheezing and SOB    [provider]  albuterol (VENTOLIN HFA) 108 (90 Base) MCG/ACT inhaler Inhale 2 puffs into the lungs 2 (two) times daily as needed for shortness of breath or wheezing. 01/14/21   [provider]  carvedilol (COREG) 6.25 MG tablet Take 1 tablet (6.25 mg total) by mouth 2 (two) times daily with a meal. 10/16/21   Mercy Riding, MD  fenofibrate 54 MG tablet Take 1 tablet (54 mg total) by mouth daily. Patient taking differently: Take 54 mg by mouth every morning. 01/24/21 06/04/22  Mariel Aloe, MD  furosemide (LASIX) 40 MG tablet Take 1 tablet (40 mg total) by mouth daily. 06/02/22 08/31/22  Eugenie Filler, MD  glimepiride (AMARYL) 4 MG tablet Take 2 tablets (8 mg total) by mouth every morning. 06/02/22 06/02/23  Eugenie Filler, MD  lisinopril (ZESTRIL) 40 MG tablet Take 1 tablet (40 mg total) by mouth daily.  06/02/22   Eugenie Filler, MD  Mupirocin [facility] EX) Apply 1 Application topically daily.    [provider]  Oxymetazoline HCl (NASAL SPRAY NA) Place 2 sprays into the nose 2 (two) times daily as needed (asthma, runny nose).    [provider]  pantoprazole (PROTONIX) 40 MG tablet Take 1 tablet (40 mg total) by mouth daily at 6 (six) AM. 06/03/22   Eugenie Filler, MD  repaglinide (PRANDIN) 1 MG tablet Take 1 mg by mouth daily. 08/19/18   [provider]  rosuvastatin (CRESTOR) 20 MG tablet Take 20 mg by mouth every morning.    [provider]  Skin Protectants, Misc. (EUCERIN) cream Apply 1 application  topically daily as needed for dry skin.    [provider]      Allergies    Aspirin, Bee venom, Ibuprofen, Iodine, Metformin and related, Nsaids, Okra, Other, Shellfish allergy, Actos [pioglitazone], Celecoxib, Latex, Morphine and related, and Ciprofloxacin    Review of Systems   Review of Systems  All other systems reviewed and are negative.   Physical Exam Updated Vital Signs BP 127/85   Pulse 96   Temp 98 F (36.7 C)   Resp 20   SpO2 100%  Physical Exam Vitals and nursing note reviewed.  Constitutional:      General: She is not in acute distress.    Appearance: Normal appearance. She is well-developed.  HENT:     Head: Normocephalic and atraumatic.  Eyes:     Conjunctiva/sclera: Conjunctivae normal.     Pupils: Pupils are equal, round, and reactive to light.  Cardiovascular:     Rate and Rhythm: Normal rate and regular rhythm.     Heart sounds: Normal heart sounds.  Pulmonary:     Effort: Pulmonary effort is normal. No respiratory distress.     Breath sounds: Normal breath sounds.  Abdominal:     General: There is no distension.     Palpations: Abdomen is soft.     Tenderness: There is no abdominal tenderness.  Musculoskeletal:        General: Tenderness present. No deformity. Normal range of motion.     Cervical  back: Normal range of motion and neck supple.     Comments: Mild tenderness and contusion to right elbow and right knee.  Full active range of motion of both upper and lower extremities.  Patient is able to stand and ambulate safely with walker assistance.  Skin:    General: Skin is warm and dry.  Neurological:     General: No focal deficit present.     Mental Status: She is alert and oriented to person, place, and time.     ED Results / Procedures / Treatments   Labs (all labs ordered are listed, but only abnormal results are displayed) Labs Reviewed - No data to display  EKG None  Radiology DG Knee 2 Views Right  Result Date: 06/04/2022 CLINICAL DATA:  Right knee pain after fall at home. EXAM: RIGHT KNEE - 1-2 VIEW COMPARISON:  Right knee radiographs 05/18/2015 FINDINGS: Severe medial compartment joint space narrowing, now with bone-on-bone contact and large peripheral degenerative osteophytes. Mild peripheral lateral compartment degenerative osteophytes are again seen. Severe patellofemoral joint space narrowing bone-on-bone contact and large peripheral osteophytes, especially of the superior and posterior aspects of the femoral condyles. Mild-to-moderate chronic enthesopathic change at the quadriceps insertion on the patella. No joint effusion. No acute fracture is seen. No dislocation. IMPRESSION: 1. Severe medial and patellofemoral compartment osteoarthritis. 2. No acute fracture is seen. Electronically Signed   By: Yvonne Kendall M.D.   On: 06/04/2022 19:03   DG Elbow 2 Views Right  Result Date: 06/04/2022 CLINICAL DATA:  Mechanical fall at home.  Pain. EXAM: RIGHT ELBOW - 2 VIEW COMPARISON:  None Available. FINDINGS: Mild chronic enthesopathic change at the triceps tendon insertion on the olecranon, the common extensor tendon origin at the lateral epicondyle, and the common flexor tendon origin at the medial epicondyle. Mild medial elbow joint space narrowing and peripheral  osteophytosis at the periphery of the trochlea and coronoid process. No elbow joint effusion. No acute fracture or dislocation. IMPRESSION: Mild medial elbow osteoarthritis.  No acute fracture. Electronically Signed   By: Yvonne Kendall M.D.   On: 06/04/2022 19:01   DG Shoulder Right  Result Date: 06/04/2022 CLINICAL DATA:  Mechanical fall at home.  Right shoulder pain. EXAM: RIGHT SHOULDER - 2+ VIEW COMPARISON:  Right shoulder radiographs 01/24/2018 FINDINGS: Moderate to severe acromioclavicular joint space narrowing and superior greater than inferior osteophytes, similar to prior. Moderate lateral downsloping of the acromion. Moderate glenohumeral joint space narrowing and peripheral osteophytosis. No acute fracture or dislocation. The visualized portion of the right lung is unremarkable. IMPRESSION: 1. 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 2. Moderate lateral downsloping of the acromion. Electronically Signed   By: Yvonne Kendall M.D.   On: 06/04/2022 18:59  Procedures Procedures    Medications Ordered in ED Medications - No data to display  ED Course/ Medical Decision Making/ A&P                             Medical Decision Making   Medical Screen Complete  This patient presented to the ED with complaint of fall.  This complaint involves an extensive number of treatment options. The initial differential diagnosis includes, but is not limited to, injury related to fall  This presentation is: Acute, Self-Limited, Previously Undiagnosed, Uncertain Prognosis, Complicated, Systemic Symptoms, and Threat to Life/Bodily Function  Patient reports fall from standing.  Exam is not suggestive of significant traumatic injury.    Imaging obtained was without evidence of acute abnormality.  Patient reassured by workup and findings.     importance of close follow-up stressed.  Strict return precautions given and understood.  O Additional history  obtained:  External records from outside sources obtained and reviewed including prior ED visits and prior Inpatient records.   Imaging Studies ordered:  I ordered imaging studies including plain films of right knee, right elbow, and right shoulder I independently visualized and interpreted obtained imaging which showed no acute process I agree with the radiologist interpretation.   Problem List / ED Course:  Fall   Reevaluation:  After the interventions noted above, I reevaluated the patient and found that they have: improved   Disposition:  After consideration of the diagnostic results and the patients response to treatment, I feel that the patent would benefit from close outpatient follow-up.          Final Clinical Impression(s) / ED Diagnoses Final diagnoses:  Fall, initial encounter    Rx / DC Orders ED Discharge Orders     None         Valarie Merino, MD 06/04/22 2144

## 2022-06-07 ENCOUNTER — Emergency Department (HOSPITAL_COMMUNITY)
Admission: EM | Admit: 2022-06-07 | Discharge: 2022-06-08 | Disposition: A | Discharge status: Home / Self Care | Payer: Medicare HMO | Attending: Emergency Medicine | Admitting: Emergency Medicine

## 2022-06-07 ENCOUNTER — Other Ambulatory Visit: Payer: Self-pay

## 2022-06-07 DIAGNOSIS — I110 Hypertensive heart disease with heart failure: Secondary | ICD-10-CM | POA: Diagnosis not present

## 2022-06-07 DIAGNOSIS — Z20822 Contact with and (suspected) exposure to covid-19: Secondary | ICD-10-CM | POA: Insufficient documentation

## 2022-06-07 DIAGNOSIS — Z7984 Long term (current) use of oral hypoglycemic drugs: Secondary | ICD-10-CM | POA: Diagnosis not present

## 2022-06-07 DIAGNOSIS — R0981 Nasal congestion: Secondary | ICD-10-CM | POA: Insufficient documentation

## 2022-06-07 DIAGNOSIS — R059 Cough, unspecified: Secondary | ICD-10-CM | POA: Insufficient documentation

## 2022-06-07 DIAGNOSIS — Z79899 Other long term (current) drug therapy: Secondary | ICD-10-CM | POA: Insufficient documentation

## 2022-06-07 DIAGNOSIS — J029 Acute pharyngitis, unspecified: Secondary | ICD-10-CM | POA: Insufficient documentation

## 2022-06-07 DIAGNOSIS — E119 Type 2 diabetes mellitus without complications: Secondary | ICD-10-CM | POA: Diagnosis not present

## 2022-06-07 DIAGNOSIS — I509 Heart failure, unspecified: Secondary | ICD-10-CM | POA: Diagnosis not present

## 2022-06-07 DIAGNOSIS — Z91040 Latex allergy status: Secondary | ICD-10-CM | POA: Diagnosis not present

## 2022-06-07 DIAGNOSIS — J45909 Unspecified asthma, uncomplicated: Secondary | ICD-10-CM | POA: Diagnosis not present

## 2022-06-07 DIAGNOSIS — I10 Essential (primary) hypertension: Secondary | ICD-10-CM | POA: Diagnosis not present

## 2022-06-07 NOTE — ED Triage Notes (Signed)
Pt reports she has been staying at her sister's and the house is too cold.  Nasal congestion and sore throat.

## 2022-06-08 ENCOUNTER — Emergency Department (HOSPITAL_COMMUNITY): Payer: Medicare HMO

## 2022-06-08 DIAGNOSIS — J029 Acute pharyngitis, unspecified: Secondary | ICD-10-CM | POA: Diagnosis not present

## 2022-06-08 LAB — BASIC METABOLIC PANEL
Anion gap: 5 (ref 5–15)
BUN: 12 mg/dL (ref 8–23)
CO2: 24 mmol/L (ref 22–32)
Calcium: 8.3 mg/dL — ABNORMAL LOW (ref 8.9–10.3)
Chloride: 103 mmol/L (ref 98–111)
Creatinine, Ser: 0.6 mg/dL (ref 0.44–1.00)
GFR, Estimated: >60 mL/min (ref >60)
Glucose, Bld: 264 mg/dL — ABNORMAL HIGH (ref 70–99)
Potassium: 3.4 mmol/L — ABNORMAL LOW (ref 3.5–5.1)
Sodium: 132 mmol/L — ABNORMAL LOW (ref 135–145)

## 2022-06-08 LAB — RESP PANEL BY RT-PCR (RSV, FLU A&B, COVID)  RVPGX2
Influenza A by PCR: NEGATIVE
Influenza B by PCR: NEGATIVE
Resp Syncytial Virus by PCR: NEGATIVE
SARS Coronavirus 2 by RT PCR: NEGATIVE

## 2022-06-08 LAB — TROPONIN I (HIGH SENSITIVITY): Troponin I (High Sensitivity): 15 ng/L (ref <18)

## 2022-06-08 NOTE — ED Provider Notes (Signed)
[place] AT [facility] Provider Note   CSN: [id number] Arrival date & time: 06/07/22  2249     History  Chief Complaint  Patient presents with   Nasal Congestion    Amanda Butler is a 67 y.o. female.  The history is provided by the patient and medical records.  Amanda Butler is a 67 y.o. female who presents to the Emergency Department complaining of congestion.  She presents to the ED for evaluation of nasal congestion.  Has associated sneeze, cough, sore throat and nasal drainage. Chills tonight, no reported fever.    No vomiting or diarrhea.  She thinks she might get diarrhea soon, but hasn't had it yet.   Hx/o CHF, asthma, DM, HTN.   Compliant with home medications.   Recently hospitalized for CHF - compliant with home meds.       Home Medications Prior to Admission medications   Medication Sig Start Date End Date Taking? Authorizing Provider  acetaminophen (TYLENOL) 500 MG tablet Take 1,000 mg by mouth 2 (two) times daily as needed (pain).    [provider]  albuterol (PROVENTIL) (2.5 MG/3ML) 0.083% nebulizer solution Take 2.5 mg by nebulization See admin instructions. Take 2.5 mg by mouth 1-2 times a day as needed for wheezing and SOB    [provider]  albuterol (VENTOLIN HFA) 108 (90 Base) MCG/ACT inhaler Inhale 2 puffs into the lungs 2 (two) times daily as needed for shortness of breath or wheezing. 01/14/21   [provider]  carvedilol (COREG) 6.25 MG tablet Take 1 tablet (6.25 mg total) by mouth 2 (two) times daily with a meal. 10/16/21   Mercy Riding, MD  fenofibrate 54 MG tablet Take 1 tablet (54 mg total) by mouth daily. Patient taking differently: Take 54 mg by mouth every morning. 01/24/21 06/04/22  Mariel Aloe, MD  furosemide (LASIX) 40 MG tablet Take 1 tablet (40 mg total) by mouth daily. 06/02/22 08/31/22  Eugenie Filler, MD  glimepiride (AMARYL) 4 MG tablet Take 2 tablets (8 mg total) by  mouth every morning. 06/02/22 06/02/23  Eugenie Filler, MD  lisinopril (ZESTRIL) 40 MG tablet Take 1 tablet (40 mg total) by mouth daily. 06/02/22   Eugenie Filler, MD  Mupirocin [facility] EX) Apply 1 Application topically daily.    [provider]  Oxymetazoline HCl (NASAL SPRAY NA) Place 2 sprays into the nose 2 (two) times daily as needed (asthma, runny nose).    [provider]  pantoprazole (PROTONIX) 40 MG tablet Take 1 tablet (40 mg total) by mouth daily at 6 (six) AM. 06/03/22   Eugenie Filler, MD  repaglinide (PRANDIN) 1 MG tablet Take 1 mg by mouth daily. 08/19/18   [provider]  rosuvastatin (CRESTOR) 20 MG tablet Take 20 mg by mouth every morning.    [provider]  Skin Protectants, Misc. (EUCERIN) cream Apply 1 application  topically daily as needed for dry skin.    [provider]      Allergies    Aspirin, Bee venom, Ibuprofen, Iodine, Metformin and related, Nsaids, Okra, Other, Shellfish allergy, Actos [pioglitazone], Celecoxib, Latex, Morphine and related, and Ciprofloxacin    Review of Systems   Review of Systems  All other systems reviewed and are negative.   Physical Exam Updated Vital Signs BP (!) 191/64   Pulse 72   Temp 98.2 F (36.8 C) (Oral)   Resp 17   Ht '5\' 6"'$  (  1.676 m)   Wt 98.9 kg   SpO2 98%   BMI 35.19 kg/m  Physical Exam Vitals and nursing note reviewed.  Constitutional:      Appearance: She is well-developed.  HENT:     Head: Normocephalic and atraumatic.  Cardiovascular:     Rate and Rhythm: Normal rate and regular rhythm.     Heart sounds: No murmur heard. Pulmonary:     Effort: Pulmonary effort is normal. No respiratory distress.     Breath sounds: Normal breath sounds.  Abdominal:     Palpations: Abdomen is soft.     Tenderness: There is no abdominal tenderness. There is no guarding or rebound.  Musculoskeletal:        General: No tenderness.     Comments: Chronic venous stasis  changes to BLE, left greater than right with chronic appearing ulcer to the left calf without acute exudate  Skin:    General: Skin is warm and dry.  Neurological:     Mental Status: She is alert and oriented to person, place, and time.  Psychiatric:        Behavior: Behavior normal.     ED Results / Procedures / Treatments   Labs (all labs ordered are listed, but only abnormal results are displayed) Labs Reviewed  BASIC METABOLIC PANEL - Abnormal; Notable for the following components:      Result Value   Sodium 132 (*)    Potassium 3.4 (*)    Glucose, Bld 264 (*)    Calcium 8.3 (*)    All other components within normal limits  RESP PANEL BY RT-PCR (RSV, FLU A&B, COVID)  RVPGX2  TROPONIN I (HIGH SENSITIVITY)    EKG EKG Interpretation  Date/Time:  Sunday June 08 2022 02:51:36 EST Ventricular Rate:  75 PR Interval:  177 QRS Duration: 134 QT Interval:  426 QTC Calculation: 476 R Axis:   12 Text Interpretation: Sinus rhythm Right bundle branch block LVH with secondary repolarization abnormality Confirmed by Quintella Reichert [phone]) on 06/08/2022 2:55:05 AM  Radiology DG Chest 2 View  Result Date: 06/08/2022 CLINICAL DATA:  Nasal congestion and sore throat. EXAM: CHEST - 2 VIEW COMPARISON:  May 29, 2022 FINDINGS: The heart size and mediastinal contours are within normal limits. Both lungs are clear. Multilevel degenerative changes seen throughout the thoracic spine. IMPRESSION: No active cardiopulmonary disease. Electronically Signed   By: Virgina Norfolk M.D.   On: 06/08/2022 04:18    Procedures Procedures    Medications Ordered in ED Medications - No data to display  ED Course/ Medical Decision Making/ A&P                             Medical Decision Making Amount and/or Complexity of Data Reviewed Labs: ordered. Radiology: ordered.   Patient with history of hypertension, CHF here for evaluation of nasal congestion, sore throat, cough.  She is  nontoxic-appearing on evaluation with no respiratory distress.  Lungs are clear bilaterally with no evidence of acute pulmonary edema or pneumonia.  Chest x-ray without acute disease-images personally reviewed and interpreted, agree with radiologist interpretation.  She is negative for COVID and flu today.  Low index of suspicion for acute cardiac event as patient has upper respiratory symptoms including nasal congestion, sore throat and cough.  Patient has chronic hypertension.  Doubt hypertensive urgency.  No evidence of strep pharyngitis, RPA, PTA on examination.  Feel patient is stable at this point to  discharge home with outpatient follow-up.          Final Clinical Impression(s) / ED Diagnoses Final diagnoses:  None    Rx / DC Orders ED Discharge Orders     None         Quintella Reichert, MD 06/08/22 [phone]

## 2022-06-09 DIAGNOSIS — E1165 Type 2 diabetes mellitus with hyperglycemia: Secondary | ICD-10-CM | POA: Diagnosis not present

## 2022-06-09 DIAGNOSIS — I161 Hypertensive emergency: Secondary | ICD-10-CM | POA: Diagnosis not present

## 2022-06-20 ENCOUNTER — Emergency Department (HOSPITAL_COMMUNITY)
Admission: EM | Admit: 2022-06-20 | Discharge: 2022-06-20 | Disposition: A | Discharge status: Home / Self Care | Payer: Medicare HMO | Attending: Emergency Medicine | Admitting: Emergency Medicine

## 2022-06-20 DIAGNOSIS — Z1152 Encounter for screening for COVID-19: Secondary | ICD-10-CM | POA: Diagnosis not present

## 2022-06-20 DIAGNOSIS — E119 Type 2 diabetes mellitus without complications: Secondary | ICD-10-CM | POA: Diagnosis not present

## 2022-06-20 DIAGNOSIS — R197 Diarrhea, unspecified: Secondary | ICD-10-CM | POA: Diagnosis present

## 2022-06-20 DIAGNOSIS — E876 Hypokalemia: Secondary | ICD-10-CM | POA: Diagnosis not present

## 2022-06-20 DIAGNOSIS — Z7984 Long term (current) use of oral hypoglycemic drugs: Secondary | ICD-10-CM | POA: Insufficient documentation

## 2022-06-20 DIAGNOSIS — I10 Essential (primary) hypertension: Secondary | ICD-10-CM | POA: Diagnosis not present

## 2022-06-20 DIAGNOSIS — D72829 Elevated white blood cell count, unspecified: Secondary | ICD-10-CM | POA: Insufficient documentation

## 2022-06-20 DIAGNOSIS — Z91040 Latex allergy status: Secondary | ICD-10-CM | POA: Insufficient documentation

## 2022-06-20 LAB — CBC WITH DIFFERENTIAL/PLATELET
Abs Immature Granulocytes: 0.05 10*3/uL (ref 0.00–0.07)
Basophils Absolute: 0 10*3/uL (ref 0.0–0.1)
Basophils Relative: 0 %
Eosinophils Absolute: 0.1 10*3/uL (ref 0.0–0.5)
Eosinophils Relative: 1 %
HCT: 40.6 % (ref 36.0–46.0)
Hemoglobin: 13.6 g/dL (ref 12.0–15.0)
Immature Granulocytes: 0 %
Lymphocytes Relative: 4 %
Lymphs Abs: 0.5 10*3/uL — ABNORMAL LOW (ref 0.7–4.0)
MCH: 28.5 pg (ref 26.0–34.0)
MCHC: 33.5 g/dL (ref 30.0–36.0)
MCV: 84.9 fL (ref 80.0–100.0)
Monocytes Absolute: 0.4 10*3/uL (ref 0.1–1.0)
Monocytes Relative: 3 %
Neutro Abs: 11.9 10*3/uL — ABNORMAL HIGH (ref 1.7–7.7)
Neutrophils Relative %: 92 %
Platelets: 186 10*3/uL (ref 150–400)
RBC: 4.78 MIL/uL (ref 3.87–5.11)
RDW: 14.1 % (ref 11.5–15.5)
WBC: 13.1 10*3/uL — ABNORMAL HIGH (ref 4.0–10.5)
nRBC: 0 % (ref 0.0–0.2)

## 2022-06-20 LAB — COMPREHENSIVE METABOLIC PANEL
ALT: 14 U/L (ref 0–44)
AST: 15 U/L (ref 15–41)
Albumin: 3.8 g/dL (ref 3.5–5.0)
Alkaline Phosphatase: 101 U/L (ref 38–126)
Anion gap: 10 (ref 5–15)
BUN: 13 mg/dL (ref 8–23)
CO2: 26 mmol/L (ref 22–32)
Calcium: 8.7 mg/dL — ABNORMAL LOW (ref 8.9–10.3)
Chloride: 101 mmol/L (ref 98–111)
Creatinine, Ser: 0.55 mg/dL (ref 0.44–1.00)
GFR, Estimated: >60 mL/min (ref >60)
Glucose, Bld: 262 mg/dL — ABNORMAL HIGH (ref 70–99)
Potassium: 2.9 mmol/L — ABNORMAL LOW (ref 3.5–5.1)
Sodium: 137 mmol/L (ref 135–145)
Total Bilirubin: 1.4 mg/dL — ABNORMAL HIGH (ref 0.3–1.2)
Total Protein: 7.8 g/dL (ref 6.5–8.1)

## 2022-06-20 LAB — MAGNESIUM: Magnesium: 1.7 mg/dL (ref 1.7–2.4)

## 2022-06-20 LAB — RESP PANEL BY RT-PCR (RSV, FLU A&B, COVID)  RVPGX2
Influenza A by PCR: NEGATIVE
Influenza B by PCR: NEGATIVE
Resp Syncytial Virus by PCR: NEGATIVE
SARS Coronavirus 2 by RT PCR: NEGATIVE

## 2022-06-20 LAB — LIPASE, BLOOD: Lipase: 41 U/L (ref 11–51)

## 2022-06-20 MED ORDER — ONDANSETRON HCL 4 MG/2ML IJ SOLN
4.0000 mg | Freq: Once | INTRAMUSCULAR | Status: AC
  Administered 2022-06-20: 4 mg via INTRAVENOUS
  Filled 2022-06-20: qty 2

## 2022-06-20 MED ORDER — FENTANYL CITRATE PF 50 MCG/ML IJ SOSY
50.0000 ug | PREFILLED_SYRINGE | Freq: Once | INTRAMUSCULAR | Status: AC
  Administered 2022-06-20: 50 ug via INTRAVENOUS
  Filled 2022-06-20: qty 1

## 2022-06-20 MED ORDER — POTASSIUM CHLORIDE CRYS ER 20 MEQ PO TBCR
40.0000 meq | EXTENDED_RELEASE_TABLET | Freq: Once | ORAL | Status: AC
  Administered 2022-06-20: 40 meq via ORAL
  Filled 2022-06-20: qty 2

## 2022-06-20 MED ORDER — MORPHINE SULFATE (PF) 2 MG/ML IV SOLN
2.0000 mg | Freq: Once | INTRAVENOUS | Status: DC
  Filled 2022-06-20: qty 1

## 2022-06-20 MED ORDER — SODIUM CHLORIDE 0.9 % IV BOLUS
250.0000 mL | Freq: Once | INTRAVENOUS | Status: AC
  Administered 2022-06-20: 250 mL via INTRAVENOUS

## 2022-06-20 MED ORDER — MAGNESIUM OXIDE -MG SUPPLEMENT 400 (240 MG) MG PO TABS
800.0000 mg | ORAL_TABLET | Freq: Once | ORAL | Status: AC
  Administered 2022-06-20: 800 mg via ORAL
  Filled 2022-06-20: qty 2

## 2022-06-20 NOTE — ED Provider Notes (Signed)
[place] EMERGENCY DEPARTMENT AT [facility] Provider Note   CSN: [id number] Arrival date & time: 06/20/22  [id number]     History  Chief Complaint  Patient presents with   Diarrhea    Amanda Butler is a 67 y.o. female.  67 yo F with a cc of diarrhea.  Going on for a couple days.  Daughter with RSV and here in the hospital.  Patient recently started on a new medicine for DM.     Diarrhea      Home Medications Prior to Admission medications   Medication Sig Start Date End Date Taking? Authorizing Provider  acetaminophen (TYLENOL) 500 MG tablet Take 1,000 mg by mouth 2 (two) times daily as needed (pain).    [provider]  albuterol (PROVENTIL) (2.5 MG/3ML) 0.083% nebulizer solution Take 2.5 mg by nebulization See admin instructions. Take 2.5 mg by mouth 1-2 times a day as needed for wheezing and SOB    [provider]  albuterol (VENTOLIN HFA) 108 (90 Base) MCG/ACT inhaler Inhale 2 puffs into the lungs 2 (two) times daily as needed for shortness of breath or wheezing. 01/14/21   [provider]  carvedilol (COREG) 6.25 MG tablet Take 1 tablet (6.25 mg total) by mouth 2 (two) times daily with a meal. 10/16/21   Mercy Riding, MD  fenofibrate 54 MG tablet Take 1 tablet (54 mg total) by mouth daily. Patient taking differently: Take 54 mg by mouth every morning. 01/24/21 06/04/22  Mariel Aloe, MD  furosemide (LASIX) 40 MG tablet Take 1 tablet (40 mg total) by mouth daily. 06/02/22 08/31/22  Eugenie Filler, MD  glimepiride (AMARYL) 4 MG tablet Take 2 tablets (8 mg total) by mouth every morning. 06/02/22 06/02/23  Eugenie Filler, MD  lisinopril (ZESTRIL) 40 MG tablet Take 1 tablet (40 mg total) by mouth daily. 06/02/22   Eugenie Filler, MD  Mupirocin [facility] EX) Apply 1 Application topically daily.    [provider]  Oxymetazoline HCl (NASAL SPRAY NA) Place 2 sprays into the nose 2 (two) times daily as needed (asthma, runny nose).     [provider]  pantoprazole (PROTONIX) 40 MG tablet Take 1 tablet (40 mg total) by mouth daily at 6 (six) AM. 06/03/22   Eugenie Filler, MD  repaglinide (PRANDIN) 1 MG tablet Take 1 mg by mouth daily. 08/19/18   [provider]  rosuvastatin (CRESTOR) 20 MG tablet Take 20 mg by mouth every morning.    [provider]  Skin Protectants, Misc. (EUCERIN) cream Apply 1 application  topically daily as needed for dry skin.    [provider]      Allergies    Aspirin, Bee venom, Ibuprofen, Iodine, Metformin and related, Nsaids, Okra, Other, Shellfish allergy, Actos [pioglitazone], Celecoxib, Latex, Morphine and related, and Ciprofloxacin    Review of Systems   Review of Systems  Gastrointestinal:  Positive for diarrhea.    Physical Exam Updated Vital Signs BP (!) 203/95 (BP Location: Right Arm)   Pulse 95   Temp 98.8 F (37.1 C) (Oral)   Resp 18   SpO2 100%  Physical Exam Vitals and nursing note reviewed.  Constitutional:      General: She is not in acute distress.    Appearance: She is well-developed. She is not diaphoretic.  HENT:     Head: Normocephalic and atraumatic.  Eyes:     Pupils: Pupils are equal, round, and reactive to light.  Cardiovascular:     Rate and Rhythm: Normal rate and regular rhythm.     Heart sounds: No murmur heard.    No friction rub. No gallop.  Pulmonary:     Effort: Pulmonary effort is normal.     Breath sounds: No wheezing or rales.  Abdominal:     General: There is no distension.     Palpations: Abdomen is soft.     Tenderness: There is no abdominal tenderness.  Musculoskeletal:        General: No tenderness.     Cervical back: Normal range of motion and neck supple.  Skin:    General: Skin is warm and dry.  Neurological:     Mental Status: She is alert and oriented to person, place, and time.  Psychiatric:        Behavior: Behavior normal.     ED Results / Procedures / Treatments   Labs (all  labs ordered are listed, but only abnormal results are displayed) Labs Reviewed  CBC WITH DIFFERENTIAL/PLATELET - Abnormal; Notable for the following components:      Result Value   WBC 13.1 (*)    Neutro Abs 11.9 (*)    Lymphs Abs 0.5 (*)    All other components within normal limits  COMPREHENSIVE METABOLIC PANEL - Abnormal; Notable for the following components:   Potassium 2.9 (*)    Glucose, Bld 262 (*)    Calcium 8.7 (*)    Total Bilirubin 1.4 (*)    All other components within normal limits  RESP PANEL BY RT-PCR (RSV, FLU A&B, COVID)  RVPGX2  LIPASE, BLOOD  MAGNESIUM    EKG None  Radiology No results found.  Procedures Procedures    Medications Ordered in ED Medications  potassium chloride SA (KLOR-CON M) CR tablet 40 mEq (has no administration in time range)  magnesium oxide (MAG-OX) tablet 800 mg (has no administration in time range)  sodium chloride 0.9 % bolus 250 mL (250 mLs Intravenous New Bag/Given 06/20/22 0522)  ondansetron (ZOFRAN) injection 4 mg (4 mg Intravenous Given 06/20/22 0530)  fentaNYL (SUBLIMAZE) injection 50 mcg (50 mcg Intravenous Given 06/20/22 0530)    ED Course/ Medical Decision Making/ A&P                             Medical Decision Making Amount and/or Complexity of Data Reviewed Labs: ordered.  Risk OTC drugs. Prescription drug management.   67 yo F with a cc of diarrhea.  Going on for a couple days.  Patient well appearing and non toxic.  Will obtain labs, check lytes.  Small bolus of fluids.  Patient with HTN on arrival, hx of same.   Patient has a mild leukocytosis.  Potassium is low at 2.9.  LFTs and lipase are unremarkable.  Magnesium is at the low end of normal at 1.7.  Will orally replenish potassium and magnesium here.  COVID and RSV are negative.  Patient is feeling a bit better but does not feel like she is able to drive just yet.  Will observe a short period until she feels like she is able to drive home versus finds a ride  to pick her up.  5:59 AM:  I have discussed the diagnosis/risks/treatment options with the patient and family.  Evaluation and diagnostic testing in the emergency department does not suggest an emergent condition requiring admission or immediate intervention beyond what has been performed at this time.  They will follow up with PCP. We also discussed returning to the ED immediately if new or worsening sx occur. We discussed the sx which are most concerning (e.g., sudden worsening pain, fever, inability to tolerate by mouth) that necessitate immediate return. Medications administered to the patient during their visit and any new prescriptions provided to the patient are listed below.  Medications given during this visit Medications  potassium chloride SA (KLOR-CON M) CR tablet 40 mEq (has no administration in time range)  magnesium oxide (MAG-OX) tablet 800 mg (has no administration in time range)  sodium chloride 0.9 % bolus 250 mL (250 mLs Intravenous New Bag/Given 06/20/22 0522)  ondansetron (ZOFRAN) injection 4 mg (4 mg Intravenous Given 06/20/22 0530)  fentaNYL (SUBLIMAZE) injection 50 mcg (50 mcg Intravenous Given 06/20/22 0530)     The patient appears reasonably screen and/or stabilized for discharge and I doubt any other medical condition or other [facility] requiring further screening, evaluation, or treatment in the ED at this time prior to discharge.          Final Clinical Impression(s) / ED Diagnoses Final diagnoses:  Diarrhea, unspecified type    Rx / DC Orders ED Discharge Orders     None         Deno Etienne, DO 06/20/22 0559

## 2022-06-20 NOTE — ED Notes (Signed)
MD aware of BP. Gave pt some fentanyl, allowed pt to rest a little before discharging her. Pt in waiting room retrieving her vehicle.

## 2022-06-20 NOTE — Discharge Instructions (Signed)
Take Imodium.  This is something he can buy over-the-counter.  Follow the instructions on the package.

## 2022-06-20 NOTE — ED Triage Notes (Signed)
Pt complains of diarrhea since yesterday. 3 watery BM today and 2 on yesterday. Pt also states nauseous when "trying to eat something". Pt has lightheadedness, and stomach burning.

## 2022-08-03 ENCOUNTER — Other Ambulatory Visit: Payer: Self-pay

## 2022-08-03 ENCOUNTER — Encounter (HOSPITAL_COMMUNITY): Payer: Self-pay

## 2022-08-03 ENCOUNTER — Emergency Department (HOSPITAL_COMMUNITY)
Admission: EM | Admit: 2022-08-03 | Discharge: 2022-08-03 | Disposition: A | Discharge status: Home / Self Care | Payer: Medicare HMO | Attending: Emergency Medicine | Admitting: Emergency Medicine

## 2022-08-03 DIAGNOSIS — I10 Essential (primary) hypertension: Secondary | ICD-10-CM | POA: Diagnosis not present

## 2022-08-03 DIAGNOSIS — Z6834 Body mass index (BMI) 34.0-34.9, adult: Secondary | ICD-10-CM | POA: Diagnosis not present

## 2022-08-03 DIAGNOSIS — R197 Diarrhea, unspecified: Secondary | ICD-10-CM | POA: Diagnosis not present

## 2022-08-03 DIAGNOSIS — I5030 Unspecified diastolic (congestive) heart failure: Secondary | ICD-10-CM | POA: Insufficient documentation

## 2022-08-03 DIAGNOSIS — I110 Hypertensive heart disease with heart failure: Secondary | ICD-10-CM | POA: Insufficient documentation

## 2022-08-03 DIAGNOSIS — Z91040 Latex allergy status: Secondary | ICD-10-CM | POA: Insufficient documentation

## 2022-08-03 DIAGNOSIS — E669 Obesity, unspecified: Secondary | ICD-10-CM | POA: Insufficient documentation

## 2022-08-03 DIAGNOSIS — I491 Atrial premature depolarization: Secondary | ICD-10-CM | POA: Diagnosis not present

## 2022-08-03 DIAGNOSIS — I4510 Unspecified right bundle-branch block: Secondary | ICD-10-CM | POA: Insufficient documentation

## 2022-08-03 DIAGNOSIS — Z79899 Other long term (current) drug therapy: Secondary | ICD-10-CM | POA: Diagnosis not present

## 2022-08-03 DIAGNOSIS — Z7984 Long term (current) use of oral hypoglycemic drugs: Secondary | ICD-10-CM | POA: Insufficient documentation

## 2022-08-03 DIAGNOSIS — R110 Nausea: Secondary | ICD-10-CM | POA: Insufficient documentation

## 2022-08-03 DIAGNOSIS — E119 Type 2 diabetes mellitus without complications: Secondary | ICD-10-CM | POA: Diagnosis not present

## 2022-08-03 LAB — CBC WITH DIFFERENTIAL/PLATELET
Abs Immature Granulocytes: 0.02 10*3/uL (ref 0.00–0.07)
Basophils Absolute: 0 10*3/uL (ref 0.0–0.1)
Basophils Relative: 1 %
Eosinophils Absolute: 0.1 10*3/uL (ref 0.0–0.5)
Eosinophils Relative: 1 %
HCT: 40.9 % (ref 36.0–46.0)
Hemoglobin: 13.6 g/dL (ref 12.0–15.0)
Immature Granulocytes: 0 %
Lymphocytes Relative: 10 %
Lymphs Abs: 0.7 10*3/uL (ref 0.7–4.0)
MCH: 27.8 pg (ref 26.0–34.0)
MCHC: 33.3 g/dL (ref 30.0–36.0)
MCV: 83.5 fL (ref 80.0–100.0)
Monocytes Absolute: 0.4 10*3/uL (ref 0.1–1.0)
Monocytes Relative: 5 %
Neutro Abs: 6.2 10*3/uL (ref 1.7–7.7)
Neutrophils Relative %: 83 %
Platelets: 221 10*3/uL (ref 150–400)
RBC: 4.9 MIL/uL (ref 3.87–5.11)
RDW: 13.6 % (ref 11.5–15.5)
WBC: 7.5 10*3/uL (ref 4.0–10.5)
nRBC: 0 % (ref 0.0–0.2)

## 2022-08-03 LAB — COMPREHENSIVE METABOLIC PANEL
ALT: 11 U/L (ref 0–44)
AST: 13 U/L — ABNORMAL LOW (ref 15–41)
Albumin: 3.5 g/dL (ref 3.5–5.0)
Alkaline Phosphatase: 83 U/L (ref 38–126)
Anion gap: 10 (ref 5–15)
BUN: 15 mg/dL (ref 8–23)
CO2: 23 mmol/L (ref 22–32)
Calcium: 8.7 mg/dL — ABNORMAL LOW (ref 8.9–10.3)
Chloride: 101 mmol/L (ref 98–111)
Creatinine, Ser: 0.68 mg/dL (ref 0.44–1.00)
GFR, Estimated: >60 mL/min (ref >60)
Glucose, Bld: 272 mg/dL — ABNORMAL HIGH (ref 70–99)
Potassium: 3.3 mmol/L — ABNORMAL LOW (ref 3.5–5.1)
Sodium: 134 mmol/L — ABNORMAL LOW (ref 135–145)
Total Bilirubin: 1.4 mg/dL — ABNORMAL HIGH (ref 0.3–1.2)
Total Protein: 7.6 g/dL (ref 6.5–8.1)

## 2022-08-03 LAB — I-STAT CHEM 8, ED
BUN: 13 mg/dL (ref 8–23)
Calcium, Ion: 1.15 mmol/L (ref 1.15–1.40)
Chloride: 102 mmol/L (ref 98–111)
Creatinine, Ser: 0.6 mg/dL (ref 0.44–1.00)
Glucose, Bld: 275 mg/dL — ABNORMAL HIGH (ref 70–99)
HCT: 41 % (ref 36.0–46.0)
Hemoglobin: 13.9 g/dL (ref 12.0–15.0)
Potassium: 3.4 mmol/L — ABNORMAL LOW (ref 3.5–5.1)
Sodium: 138 mmol/L (ref 135–145)
TCO2: 25 mmol/L (ref 22–32)

## 2022-08-03 LAB — C DIFFICILE QUICK SCREEN W PCR REFLEX
C Diff antigen: NEGATIVE
C Diff interpretation: NOT DETECTED
C Diff toxin: NEGATIVE

## 2022-08-03 MED ORDER — SODIUM CHLORIDE 0.9 % IV BOLUS
500.0000 mL | Freq: Once | INTRAVENOUS | Status: AC
  Administered 2022-08-03: 500 mL via INTRAVENOUS

## 2022-08-03 MED ORDER — SODIUM CHLORIDE 0.9 % IV BOLUS
1000.0000 mL | Freq: Once | INTRAVENOUS | Status: AC
  Administered 2022-08-03: 1000 mL via INTRAVENOUS

## 2022-08-03 MED ORDER — LOPERAMIDE HCL 2 MG PO CAPS
4.0000 mg | ORAL_CAPSULE | Freq: Once | ORAL | Status: AC
  Administered 2022-08-03: 4 mg via ORAL
  Filled 2022-08-03: qty 2

## 2022-08-03 MED ORDER — METOCLOPRAMIDE HCL 5 MG/ML IJ SOLN
10.0000 mg | Freq: Once | INTRAMUSCULAR | Status: AC
  Administered 2022-08-03: 10 mg via INTRAVENOUS
  Filled 2022-08-03: qty 2

## 2022-08-03 MED ORDER — PANTOPRAZOLE SODIUM 40 MG IV SOLR
40.0000 mg | Freq: Once | INTRAVENOUS | Status: AC
  Administered 2022-08-03: 40 mg via INTRAVENOUS
  Filled 2022-08-03: qty 10

## 2022-08-03 NOTE — ED Triage Notes (Signed)
C/o lower abd pain and diarrhea x 2 days.  Imodium w/o relief. Denies n/v

## 2022-08-03 NOTE — ED Notes (Signed)
Re-wrapped bilateral lower legs with Xeroform, guaze and ace bandages

## 2022-08-03 NOTE — ED Provider Notes (Signed)
[place] EMERGENCY DEPARTMENT AT [facility] Provider Note   CSN: [id number] Arrival date & time: 08/03/22  1356     History  Chief Complaint  Patient presents with   Diarrhea    Amanda Butler is a 67 y.o. female.  Patient is a 67 year old female with a history of hypertension, hyperlipidemia, diabetes, diastolic heart failure and obesity who presents with diarrhea.  She said that it started a week ago but got worse over the weekend.  She is having about 3 episodes of diarrhea per day.  She says it is watery/loose.  No blood.  She has some mild nausea and increased reflux symptoms but no vomiting.  No abdominal pain.  She has felt feverish but has not checked her temperature because her thermometer broke.  She says she normally does not have GI issues.  She did have an episode for a few days of diarrhea last month but it had resolved.       Home Medications Prior to Admission medications   Medication Sig Start Date End Date Taking? Authorizing Provider  acetaminophen (TYLENOL) 500 MG tablet Take 1,000 mg by mouth 2 (two) times daily as needed (pain).    [provider]  albuterol (PROVENTIL) (2.5 MG/3ML) 0.083% nebulizer solution Take 2.5 mg by nebulization See admin instructions. Take 2.5 mg by mouth 1-2 times a day as needed for wheezing and SOB    [provider]  albuterol (VENTOLIN HFA) 108 (90 Base) MCG/ACT inhaler Inhale 2 puffs into the lungs 2 (two) times daily as needed for shortness of breath or wheezing. 01/14/21   [provider]  carvedilol (COREG) 6.25 MG tablet Take 1 tablet (6.25 mg total) by mouth 2 (two) times daily with a meal. 10/16/21   Almon Hercules, MD  fenofibrate 54 MG tablet Take 1 tablet (54 mg total) by mouth daily. Patient taking differently: Take 54 mg by mouth every morning. 01/24/21 06/04/22  Narda Bonds, MD  furosemide (LASIX) 40 MG tablet Take 1 tablet (40 mg total) by mouth daily. 06/02/22 08/31/22  Rodolph Bong, MD  glimepiride (AMARYL) 4 MG tablet Take 2 tablets (8 mg total) by mouth every morning. 06/02/22 06/02/23  Rodolph Bong, MD  lisinopril (ZESTRIL) 40 MG tablet Take 1 tablet (40 mg total) by mouth daily. 06/02/22   Rodolph Bong, MD  Mupirocin [facility] EX) Apply 1 Application topically daily.    [provider]  Oxymetazoline HCl (NASAL SPRAY NA) Place 2 sprays into the nose 2 (two) times daily as needed (asthma, runny nose).    [provider]  pantoprazole (PROTONIX) 40 MG tablet Take 1 tablet (40 mg total) by mouth daily at 6 (six) AM. 06/03/22   Rodolph Bong, MD  repaglinide (PRANDIN) 1 MG tablet Take 1 mg by mouth daily. 08/19/18   [provider]  rosuvastatin (CRESTOR) 20 MG tablet Take 20 mg by mouth every morning.    [provider]  Skin Protectants, Misc. (EUCERIN) cream Apply 1 application  topically daily as needed for dry skin.    [provider]      Allergies    Aspirin, Bee venom, Ibuprofen, Iodine, Metformin and related, Nsaids, [place], Other, Shellfish allergy, Actos [pioglitazone], Celecoxib, Latex, Morphine and related, and Ciprofloxacin    Review of Systems   Review of Systems  Constitutional:  Positive for fatigue and fever (Subjective). Negative for chills and diaphoresis.  HENT:  Negative for congestion, rhinorrhea and  sneezing.   Eyes: Negative.   Respiratory:  Negative for cough, chest tightness and shortness of breath.   Cardiovascular:  Negative for chest pain and leg swelling.  Gastrointestinal:  Positive for diarrhea and nausea. Negative for abdominal pain, blood in stool and vomiting.  Genitourinary:  Negative for difficulty urinating, flank pain, frequency and hematuria.  Musculoskeletal:  Negative for arthralgias and back pain.  Skin:  Negative for rash.  Neurological:  Positive for light-headedness. Negative for dizziness, speech difficulty, weakness, numbness and headaches.    Physical  Exam Updated Vital Signs BP (!) 163/73   Pulse 90   Temp 97.8 F (36.6 C) (Oral)   Resp 15   Wt 98 kg   SpO2 98%   BMI 34.87 kg/m  Physical Exam Constitutional:      Appearance: She is well-developed. She is obese.  HENT:     Head: Normocephalic and atraumatic.  Eyes:     Pupils: Pupils are equal, round, and reactive to light.  Cardiovascular:     Rate and Rhythm: Normal rate and regular rhythm.     Heart sounds: Normal heart sounds.  Pulmonary:     [place]: Pulmonary effort is normal. No respiratory distress.     Breath sounds: Normal breath sounds. No wheezing or rales.  Chest:     Chest wall: No tenderness.  Abdominal:     General: Bowel sounds are normal.     Palpations: Abdomen is soft.     Tenderness: There is no abdominal tenderness. There is no guarding or rebound.  Musculoskeletal:        General: Normal range of motion.     Cervical back: Normal range of motion and neck supple.     Comments: Chronic appearing lower extremity edema.  She has some areas of skin breakdown which she says she goes to the wound care clinic for although she says she has not been in a while.  She changes the dressing daily.  No purulent drainage or other signs of infection.  Lymphadenopathy:     Cervical: No cervical adenopathy.  Skin:    General: Skin is warm and dry.     Findings: No rash.     Comments: Some erythema under her abdominal pannus.  There are some lesions suggestive of candidal infection.   Neurological:     Mental Status: She is alert and oriented to person, place, and time.     ED Results / Procedures / Treatments   Labs (all labs ordered are listed, but only abnormal results are displayed) Labs Reviewed  COMPREHENSIVE METABOLIC PANEL - Abnormal; Notable for the following components:      Result Value   Sodium 134 (*)    Potassium 3.3 (*)    Glucose, Bld 272 (*)    Calcium 8.7 (*)    AST 13 (*)    Total Bilirubin 1.4 (*)    All other components within normal  limits  I-STAT CHEM 8, ED - Abnormal; Notable for the following components:   Potassium 3.4 (*)    Glucose, Bld 275 (*)    All other components within normal limits  C DIFFICILE QUICK SCREEN W PCR REFLEX    GASTROINTESTINAL PANEL BY PCR, STOOL (REPLACES STOOL CULTURE)  CBC WITH DIFFERENTIAL/PLATELET    EKG EKG Interpretation  Date/Time:  Sunday August 03 2022 17:28:01 EDT Ventricular Rate:  85 PR Interval:  186 QRS Duration: 136 QT Interval:  418 QTC Calculation: 498 R Axis:   -11 Text  Interpretation: Sinus rhythm Atrial premature complex Right bundle branch block Left ventricular hypertrophy Borderline prolonged QT interval since last tracing no significant change Confirmed by Rolan Bucco [phone]) on 08/03/2022 5:36:28 PM  Radiology No results found.  Procedures Procedures    Medications Ordered in ED Medications  sodium chloride 0.9 % bolus 1,000 mL (0 mLs Intravenous Stopped 08/03/22 1619)  loperamide (IMODIUM) capsule 4 mg (4 mg Oral Given 08/03/22 1517)  pantoprazole (PROTONIX) injection 40 mg (40 mg Intravenous Given 08/03/22 1518)  sodium chloride 0.9 % bolus 500 mL (0 mLs Intravenous Stopped 08/03/22 1852)  metoCLOPramide (REGLAN) injection 10 mg (10 mg Intravenous Given 08/03/22 1736)    ED Course/ Medical Decision Making/ A&P                             Medical Decision Making Amount and/or Complexity of Data Reviewed External Data Reviewed: notes. Labs: ordered. Decision-making details documented in ED Course. ECG/medicine tests: ordered and independent interpretation performed. Decision-making details documented in ED Course.  Risk OTC drugs. Decision regarding hospitalization.   Patient is a 67 year old female who presents with loose stools.  She said it has been going on for about a week but worse over the weekend.  She has no abdominal tenderness on exam.  Her labs are overall nonconcerning.  She has some evidence of a candidal yeast infection under her  pannus but says that she takes medication at home for this and has a powder that she uses.  She has some wounds on her lower extremities which were cleaned and redressed.  No apparent active infection.  She is overall well-appearing.  She was given some IV fluids and antiemetics in the ED.  She is able to tolerate oral fluids.  She has had a few stools here in the ED but per the nurse they were small and not overt diarrhea.  They were just more soft stools.  C. difficile was tested which was negative.  GI panel is pending.  This point she is well-appearing and appears to be appropriate for discharge.  I do not see an indication for hospitalization.  She was discharged home in good condition.  She was encouraged to have close follow-up with her PCP.  Return precautions were given.  Final Clinical Impression(s) / ED Diagnoses Final diagnoses:  Diarrhea, unspecified type    Rx / DC Orders ED Discharge Orders     None         Rolan Bucco, MD 08/03/22 [phone]

## 2022-08-04 LAB — GASTROINTESTINAL PANEL BY PCR, STOOL (REPLACES STOOL CULTURE)

## 2022-08-12 ENCOUNTER — Other Ambulatory Visit (HOSPITAL_COMMUNITY): Payer: Self-pay

## 2022-09-24 ENCOUNTER — Inpatient Hospital Stay (HOSPITAL_COMMUNITY)
Admission: EM | Admit: 2022-09-24 | Discharge: 2022-10-03 | DRG: 690 | Disposition: A | Discharge status: Skilled Nursing Facility | Payer: Medicare HMO | Attending: Family Medicine | Admitting: Family Medicine

## 2022-09-24 ENCOUNTER — Emergency Department (HOSPITAL_BASED_OUTPATIENT_CLINIC_OR_DEPARTMENT_OTHER): Payer: Medicare HMO

## 2022-09-24 ENCOUNTER — Emergency Department (HOSPITAL_COMMUNITY): Payer: Medicare HMO

## 2022-09-24 ENCOUNTER — Encounter (HOSPITAL_COMMUNITY): Payer: Self-pay | Admitting: Internal Medicine

## 2022-09-24 DIAGNOSIS — E669 Obesity, unspecified: Secondary | ICD-10-CM | POA: Diagnosis present

## 2022-09-24 DIAGNOSIS — R739 Hyperglycemia, unspecified: Secondary | ICD-10-CM | POA: Diagnosis not present

## 2022-09-24 DIAGNOSIS — Z789 Other specified health status: Secondary | ICD-10-CM

## 2022-09-24 DIAGNOSIS — E119 Type 2 diabetes mellitus without complications: Secondary | ICD-10-CM

## 2022-09-24 DIAGNOSIS — I110 Hypertensive heart disease with heart failure: Secondary | ICD-10-CM | POA: Diagnosis present

## 2022-09-24 DIAGNOSIS — R531 Weakness: Secondary | ICD-10-CM | POA: Diagnosis not present

## 2022-09-24 DIAGNOSIS — I5032 Chronic diastolic (congestive) heart failure: Secondary | ICD-10-CM | POA: Diagnosis not present

## 2022-09-24 DIAGNOSIS — H538 Other visual disturbances: Secondary | ICD-10-CM | POA: Diagnosis present

## 2022-09-24 DIAGNOSIS — I890 Lymphedema, not elsewhere classified: Secondary | ICD-10-CM | POA: Diagnosis not present

## 2022-09-24 DIAGNOSIS — Z743 Need for continuous supervision: Secondary | ICD-10-CM | POA: Diagnosis not present

## 2022-09-24 DIAGNOSIS — Z91030 Bee allergy status: Secondary | ICD-10-CM

## 2022-09-24 DIAGNOSIS — R296 Repeated falls: Secondary | ICD-10-CM | POA: Diagnosis present

## 2022-09-24 DIAGNOSIS — I872 Venous insufficiency (chronic) (peripheral): Secondary | ICD-10-CM | POA: Diagnosis present

## 2022-09-24 DIAGNOSIS — B369 Superficial mycosis, unspecified: Secondary | ICD-10-CM | POA: Diagnosis present

## 2022-09-24 DIAGNOSIS — E538 Deficiency of other specified B group vitamins: Secondary | ICD-10-CM | POA: Diagnosis present

## 2022-09-24 DIAGNOSIS — Z885 Allergy status to narcotic agent status: Secondary | ICD-10-CM

## 2022-09-24 DIAGNOSIS — E1165 Type 2 diabetes mellitus with hyperglycemia: Secondary | ICD-10-CM | POA: Diagnosis present

## 2022-09-24 DIAGNOSIS — Z1152 Encounter for screening for COVID-19: Secondary | ICD-10-CM

## 2022-09-24 DIAGNOSIS — N3000 Acute cystitis without hematuria: Secondary | ICD-10-CM | POA: Diagnosis present

## 2022-09-24 DIAGNOSIS — I10 Essential (primary) hypertension: Secondary | ICD-10-CM | POA: Diagnosis present

## 2022-09-24 DIAGNOSIS — E876 Hypokalemia: Secondary | ICD-10-CM | POA: Diagnosis not present

## 2022-09-24 DIAGNOSIS — Z5900 Homelessness unspecified: Secondary | ICD-10-CM | POA: Diagnosis not present

## 2022-09-24 DIAGNOSIS — Z751 Person awaiting admission to adequate facility elsewhere: Secondary | ICD-10-CM

## 2022-09-24 DIAGNOSIS — Z8249 Family history of ischemic heart disease and other diseases of the circulatory system: Secondary | ICD-10-CM

## 2022-09-24 DIAGNOSIS — M545 Low back pain, unspecified: Secondary | ICD-10-CM | POA: Diagnosis not present

## 2022-09-24 DIAGNOSIS — R9431 Abnormal electrocardiogram [ECG] [EKG]: Secondary | ICD-10-CM | POA: Diagnosis present

## 2022-09-24 DIAGNOSIS — R7989 Other specified abnormal findings of blood chemistry: Secondary | ICD-10-CM | POA: Diagnosis present

## 2022-09-24 DIAGNOSIS — M5136 Other intervertebral disc degeneration, lumbar region: Secondary | ICD-10-CM | POA: Diagnosis not present

## 2022-09-24 DIAGNOSIS — N390 Urinary tract infection, site not specified: Secondary | ICD-10-CM | POA: Diagnosis not present

## 2022-09-24 DIAGNOSIS — Z5919 Other inadequate housing: Secondary | ICD-10-CM

## 2022-09-24 DIAGNOSIS — Z8614 Personal history of Methicillin resistant Staphylococcus aureus infection: Secondary | ICD-10-CM

## 2022-09-24 DIAGNOSIS — Z7984 Long term (current) use of oral hypoglycemic drugs: Secondary | ICD-10-CM

## 2022-09-24 DIAGNOSIS — I4510 Unspecified right bundle-branch block: Secondary | ICD-10-CM | POA: Diagnosis present

## 2022-09-24 DIAGNOSIS — M7989 Other specified soft tissue disorders: Secondary | ICD-10-CM

## 2022-09-24 DIAGNOSIS — R079 Chest pain, unspecified: Secondary | ICD-10-CM | POA: Diagnosis not present

## 2022-09-24 DIAGNOSIS — Z9181 History of falling: Secondary | ICD-10-CM

## 2022-09-24 DIAGNOSIS — L03116 Cellulitis of left lower limb: Secondary | ICD-10-CM | POA: Diagnosis not present

## 2022-09-24 DIAGNOSIS — M170 Bilateral primary osteoarthritis of knee: Secondary | ICD-10-CM | POA: Diagnosis present

## 2022-09-24 DIAGNOSIS — I160 Hypertensive urgency: Secondary | ICD-10-CM | POA: Diagnosis present

## 2022-09-24 DIAGNOSIS — L97929 Non-pressure chronic ulcer of unspecified part of left lower leg with unspecified severity: Secondary | ICD-10-CM | POA: Diagnosis present

## 2022-09-24 DIAGNOSIS — R001 Bradycardia, unspecified: Secondary | ICD-10-CM | POA: Diagnosis present

## 2022-09-24 DIAGNOSIS — E782 Mixed hyperlipidemia: Secondary | ICD-10-CM

## 2022-09-24 DIAGNOSIS — R42 Dizziness and giddiness: Secondary | ICD-10-CM | POA: Diagnosis not present

## 2022-09-24 DIAGNOSIS — K219 Gastro-esophageal reflux disease without esophagitis: Secondary | ICD-10-CM | POA: Diagnosis not present

## 2022-09-24 DIAGNOSIS — E7800 Pure hypercholesterolemia, unspecified: Secondary | ICD-10-CM | POA: Diagnosis present

## 2022-09-24 DIAGNOSIS — Z888 Allergy status to other drugs, medicaments and biological substances status: Secondary | ICD-10-CM

## 2022-09-24 DIAGNOSIS — Z66 Do not resuscitate: Secondary | ICD-10-CM | POA: Diagnosis present

## 2022-09-24 DIAGNOSIS — J45909 Unspecified asthma, uncomplicated: Secondary | ICD-10-CM | POA: Diagnosis present

## 2022-09-24 DIAGNOSIS — Z9109 Other allergy status, other than to drugs and biological substances: Secondary | ICD-10-CM

## 2022-09-24 DIAGNOSIS — I83009 Varicose veins of unspecified lower extremity with ulcer of unspecified site: Secondary | ICD-10-CM | POA: Diagnosis present

## 2022-09-24 DIAGNOSIS — Z6834 Body mass index (BMI) 34.0-34.9, adult: Secondary | ICD-10-CM

## 2022-09-24 DIAGNOSIS — Z79899 Other long term (current) drug therapy: Secondary | ICD-10-CM

## 2022-09-24 DIAGNOSIS — W19XXXA Unspecified fall, initial encounter: Secondary | ICD-10-CM | POA: Diagnosis present

## 2022-09-24 DIAGNOSIS — Z825 Family history of asthma and other chronic lower respiratory diseases: Secondary | ICD-10-CM

## 2022-09-24 DIAGNOSIS — M48061 Spinal stenosis, lumbar region without neurogenic claudication: Secondary | ICD-10-CM | POA: Diagnosis not present

## 2022-09-24 DIAGNOSIS — Z833 Family history of diabetes mellitus: Secondary | ICD-10-CM

## 2022-09-24 DIAGNOSIS — Y92009 Unspecified place in unspecified non-institutional (private) residence as the place of occurrence of the external cause: Secondary | ICD-10-CM

## 2022-09-24 DIAGNOSIS — L97919 Non-pressure chronic ulcer of unspecified part of right lower leg with unspecified severity: Secondary | ICD-10-CM | POA: Diagnosis present

## 2022-09-24 DIAGNOSIS — E785 Hyperlipidemia, unspecified: Secondary | ICD-10-CM | POA: Diagnosis present

## 2022-09-24 DIAGNOSIS — Z91041 Radiographic dye allergy status: Secondary | ICD-10-CM

## 2022-09-24 DIAGNOSIS — Z886 Allergy status to analgesic agent status: Secondary | ICD-10-CM

## 2022-09-24 DIAGNOSIS — Z8673 Personal history of transient ischemic attack (TIA), and cerebral infarction without residual deficits: Secondary | ICD-10-CM

## 2022-09-24 LAB — COMPREHENSIVE METABOLIC PANEL
ALT: 13 U/L (ref 0–44)
AST: 15 U/L (ref 15–41)
Albumin: 3.3 g/dL — ABNORMAL LOW (ref 3.5–5.0)
Alkaline Phosphatase: 70 U/L (ref 38–126)
Anion gap: 17 — ABNORMAL HIGH (ref 5–15)
BUN: 12 mg/dL (ref 8–23)
CO2: 22 mmol/L (ref 22–32)
Calcium: 9.1 mg/dL (ref 8.9–10.3)
Chloride: 97 mmol/L — ABNORMAL LOW (ref 98–111)
Creatinine, Ser: 0.79 mg/dL (ref 0.44–1.00)
GFR, Estimated: >60 mL/min (ref >60)
Glucose, Bld: 237 mg/dL — ABNORMAL HIGH (ref 70–99)
Potassium: 3.1 mmol/L — ABNORMAL LOW (ref 3.5–5.1)
Sodium: 136 mmol/L (ref 135–145)
Total Bilirubin: 2 mg/dL — ABNORMAL HIGH (ref 0.3–1.2)
Total Protein: 7.4 g/dL (ref 6.5–8.1)

## 2022-09-24 LAB — URINALYSIS, ROUTINE W REFLEX MICROSCOPIC
Bilirubin Urine: NEGATIVE
Glucose, UA: 150 mg/dL — AB
Hgb urine dipstick: NEGATIVE
Ketones, ur: 5 mg/dL — AB
Nitrite: POSITIVE — AB
Protein, ur: 100 mg/dL — AB
Specific Gravity, Urine: 1.014 (ref 1.005–1.030)
pH: 7 (ref 5.0–8.0)

## 2022-09-24 LAB — CBC WITH DIFFERENTIAL/PLATELET
Abs Immature Granulocytes: 0.03 10*3/uL (ref 0.00–0.07)
Basophils Absolute: 0 10*3/uL (ref 0.0–0.1)
Basophils Relative: 0 %
Eosinophils Absolute: 0.1 10*3/uL (ref 0.0–0.5)
Eosinophils Relative: 1 %
HCT: 41.2 % (ref 36.0–46.0)
Hemoglobin: 13.9 g/dL (ref 12.0–15.0)
Immature Granulocytes: 0 %
Lymphocytes Relative: 8 %
Lymphs Abs: 0.7 10*3/uL (ref 0.7–4.0)
MCH: 27.6 pg (ref 26.0–34.0)
MCHC: 33.7 g/dL (ref 30.0–36.0)
MCV: 81.9 fL (ref 80.0–100.0)
Monocytes Absolute: 0.4 10*3/uL (ref 0.1–1.0)
Monocytes Relative: 5 %
Neutro Abs: 7.1 10*3/uL (ref 1.7–7.7)
Neutrophils Relative %: 86 %
Platelets: 191 10*3/uL (ref 150–400)
RBC: 5.03 MIL/uL (ref 3.87–5.11)
RDW: 14.2 % (ref 11.5–15.5)
WBC: 8.4 10*3/uL (ref 4.0–10.5)
nRBC: 0 % (ref 0.0–0.2)

## 2022-09-24 LAB — RESP PANEL BY RT-PCR (RSV, FLU A&B, COVID)  RVPGX2
Influenza A by PCR: NEGATIVE
Influenza B by PCR: NEGATIVE
Resp Syncytial Virus by PCR: NEGATIVE
SARS Coronavirus 2 by RT PCR: NEGATIVE

## 2022-09-24 LAB — D-DIMER, QUANTITATIVE: D-Dimer, Quant: 0.95 ug/mL-FEU — ABNORMAL HIGH (ref 0.00–0.50)

## 2022-09-24 LAB — CK: Total CK: 20 U/L — ABNORMAL LOW (ref 38–234)

## 2022-09-24 LAB — TROPONIN I (HIGH SENSITIVITY)
Troponin I (High Sensitivity): 10 ng/L (ref <18)
Troponin I (High Sensitivity): 15 ng/L (ref <18)

## 2022-09-24 LAB — BRAIN NATRIURETIC PEPTIDE: B Natriuretic Peptide: 77.1 pg/mL (ref 0.0–100.0)

## 2022-09-24 LAB — CBG MONITORING, ED: Glucose-Capillary: 204 mg/dL — ABNORMAL HIGH (ref 70–99)

## 2022-09-24 LAB — LACTIC ACID, PLASMA: Lactic Acid, Venous: 1.3 mmol/L (ref 0.5–1.9)

## 2022-09-24 MED ORDER — CARVEDILOL 3.125 MG PO TABS
6.2500 mg | ORAL_TABLET | Freq: Two times a day (BID) | ORAL | Status: DC
  Administered 2022-09-24: 6.25 mg via ORAL
  Filled 2022-09-24: qty 2

## 2022-09-24 MED ORDER — FENOFIBRATE 54 MG PO TABS
54.0000 mg | ORAL_TABLET | Freq: Every morning | ORAL | Status: DC
  Administered 2022-09-25 – 2022-10-03 (×9): 54 mg via ORAL
  Filled 2022-09-24 (×9): qty 1

## 2022-09-24 MED ORDER — LABETALOL HCL 5 MG/ML IV SOLN
5.0000 mg | Freq: Once | INTRAVENOUS | Status: AC
  Administered 2022-09-24: 5 mg via INTRAVENOUS
  Filled 2022-09-24: qty 4

## 2022-09-24 MED ORDER — SODIUM CHLORIDE 0.9 % IV SOLN
1.0000 g | INTRAVENOUS | Status: DC
  Administered 2022-09-25 – 2022-09-27 (×3): 1 g via INTRAVENOUS
  Filled 2022-09-24 (×4): qty 10

## 2022-09-24 MED ORDER — CARVEDILOL 6.25 MG PO TABS
6.2500 mg | ORAL_TABLET | Freq: Two times a day (BID) | ORAL | Status: DC
  Administered 2022-09-25 (×2): 6.25 mg via ORAL
  Filled 2022-09-24 (×2): qty 1

## 2022-09-24 MED ORDER — POTASSIUM CHLORIDE CRYS ER 20 MEQ PO TBCR
40.0000 meq | EXTENDED_RELEASE_TABLET | Freq: Once | ORAL | Status: AC
  Administered 2022-09-25: 40 meq via ORAL
  Filled 2022-09-24: qty 2

## 2022-09-24 MED ORDER — SODIUM CHLORIDE 0.9 % IV SOLN
1.0000 g | Freq: Once | INTRAVENOUS | Status: AC
  Administered 2022-09-24: 1 g via INTRAVENOUS
  Filled 2022-09-24: qty 10

## 2022-09-24 MED ORDER — ROSUVASTATIN CALCIUM 20 MG PO TABS
20.0000 mg | ORAL_TABLET | Freq: Every morning | ORAL | Status: DC
  Administered 2022-09-25 – 2022-10-03 (×9): 20 mg via ORAL
  Filled 2022-09-24 (×9): qty 1

## 2022-09-24 MED ORDER — MELATONIN 3 MG PO TABS
3.0000 mg | ORAL_TABLET | Freq: Every evening | ORAL | Status: DC | PRN
  Administered 2022-09-29: 3 mg via ORAL
  Filled 2022-09-24: qty 1

## 2022-09-24 MED ORDER — ACETAMINOPHEN 325 MG PO TABS
650.0000 mg | ORAL_TABLET | Freq: Four times a day (QID) | ORAL | Status: DC | PRN
  Administered 2022-09-25 – 2022-10-02 (×7): 650 mg via ORAL
  Filled 2022-09-24 (×7): qty 2

## 2022-09-24 MED ORDER — PANTOPRAZOLE SODIUM 40 MG PO TBEC
40.0000 mg | DELAYED_RELEASE_TABLET | Freq: Every day | ORAL | Status: DC
  Administered 2022-09-25 – 2022-10-03 (×9): 40 mg via ORAL
  Filled 2022-09-24 (×9): qty 1

## 2022-09-24 MED ORDER — ACETAMINOPHEN 650 MG RE SUPP: 650.0000 mg | Freq: Four times a day (QID) | RECTAL | Status: DC | PRN

## 2022-09-24 MED ORDER — LISINOPRIL 20 MG PO TABS
40.0000 mg | ORAL_TABLET | Freq: Once | ORAL | Status: AC
  Administered 2022-09-24: 40 mg via ORAL
  Filled 2022-09-24: qty 2

## 2022-09-24 MED ORDER — FUROSEMIDE 20 MG PO TABS
40.0000 mg | ORAL_TABLET | Freq: Every day | ORAL | Status: DC
  Administered 2022-09-25 – 2022-10-02 (×8): 40 mg via ORAL
  Filled 2022-09-24 (×8): qty 2

## 2022-09-24 MED ORDER — METHYLPREDNISOLONE SODIUM SUCC 40 MG IJ SOLR
40.0000 mg | Freq: Once | INTRAMUSCULAR | Status: AC
  Administered 2022-09-24: 40 mg via INTRAVENOUS
  Filled 2022-09-24: qty 1

## 2022-09-24 MED ORDER — INSULIN ASPART 100 UNIT/ML IJ SOLN
0.0000 [IU] | Freq: Three times a day (TID) | INTRAMUSCULAR | Status: DC
  Administered 2022-09-25 (×2): 7 [IU] via SUBCUTANEOUS
  Administered 2022-09-25: 5 [IU] via SUBCUTANEOUS
  Administered 2022-09-26: 7 [IU] via SUBCUTANEOUS
  Administered 2022-09-26 – 2022-09-27 (×3): 5 [IU] via SUBCUTANEOUS
  Administered 2022-09-27: 3 [IU] via SUBCUTANEOUS

## 2022-09-24 MED ORDER — LISINOPRIL 20 MG PO TABS
40.0000 mg | ORAL_TABLET | Freq: Every day | ORAL | Status: DC
  Administered 2022-09-25 – 2022-10-01 (×7): 40 mg via ORAL
  Filled 2022-09-24 (×8): qty 2

## 2022-09-24 MED ORDER — DIPHENHYDRAMINE HCL 50 MG/ML IJ SOLN
50.0000 mg | Freq: Once | INTRAMUSCULAR | Status: AC
  Administered 2022-09-25: 50 mg via INTRAVENOUS
  Filled 2022-09-24 (×2): qty 1

## 2022-09-24 MED ORDER — ONDANSETRON HCL 4 MG/2ML IJ SOLN: 4.0000 mg | Freq: Four times a day (QID) | INTRAMUSCULAR | Status: DC | PRN

## 2022-09-24 MED ORDER — LACTATED RINGERS IV BOLUS
1000.0000 mL | Freq: Once | INTRAVENOUS | Status: AC
  Administered 2022-09-24: 1000 mL via INTRAVENOUS

## 2022-09-24 MED ORDER — DIPHENHYDRAMINE HCL 25 MG PO CAPS
50.0000 mg | ORAL_CAPSULE | Freq: Once | ORAL | Status: AC
  Filled 2022-09-24: qty 2
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
## 2022-09-24 NOTE — ED Notes (Signed)
ED TO INPATIENT HANDOFF REPORT  ED Nurse Name and Phone #: [address]/ [phone]  S Name/Age/Gender Amanda Butler 67 y.o. female Room/Bed: 001C/001C  Code Status   Code Status: DNR  Home/SNF/Other Home Patient oriented to: self, place, time, and situation Is this baseline? Yes   Triage Complete: Triage complete  Chief Complaint Generalized weakness [R53.1]  Triage Note Patient BIB EMS coming from home with c/o dizziness x4 day, unstable on feet, blurred vision, and hypertension.Fall today into trash pile today in house. No blood thinner.  Has not been able to get meds x1 month.B/P 240/120, CBG 260.    Allergies Allergies  Allergen Reactions   Aspirin Hives and Shortness Of Breath   Bee Venom Anaphylaxis   Ibuprofen Anaphylaxis and Swelling    Swelling of throat   Iodine Shortness Of Breath and Rash   Metformin And Related Swelling and Other (See Comments)    Patient reports flu-like symptoms and fatigue Swelling of throat   Nsaids Anaphylaxis, Hives, Swelling and Other (See Comments)    Swelling of throat- Tylenol is tolerated   [place]ra Shortness Of Breath, Itching, Rash and Other (See Comments)    Turnip greens, collard greens, pickles, and squash   Other Shortness Of Breath and Other (See Comments)    Reaction to "X-ray dye"   Shellfish Allergy Anaphylaxis   Actos [Pioglitazone] Other (See Comments)    Erratic heartbeat   Celecoxib Other (See Comments)    Unknown reaction   Latex Hives   Morphine And Codeine Other (See Comments)    Childhood allergy (67 years old) , pt got sicker , temp went up, dr gave her the wrong medication   Ciprofloxacin Palpitations    Joint pain    Level of Care/Admitting Diagnosis ED Disposition     ED Disposition  Admit   Condition  --   Comment  Hospital Area: [facility] [[id number]]  Level of Care: Telemetry Medical [104]  May admit patient to [facility] or [facility] if equivalent level of care is available:: No   Covid Evaluation: Asymptomatic - no recent exposure (last 10 days) testing not required  Diagnosis: Generalized weakness [454098]  Admitting Physician: Angie Fava [[id number]]  Attending Physician: Angie Fava [[id number]]  Certification:: I certify this patient will need inpatient services for at least 2 midnights  Estimated Length of Stay: 2          B Medical/Surgery History Past Medical History:  Diagnosis Date   Arthritis    "knees" (09/13/2015)   Asthma    Cellulitis 11/17/2018   LEFT LEG    Chronic diastolic CHF (congestive heart failure) (HCC)    a. 09/2015 Echo: EF 55-60%, mild LVH, gr2DD, no rwma, mild MR, mildly dil LA.    GERD (gastroesophageal reflux disease)    High cholesterol    Hypertension    Midsternal chest pain    a. 09/2015 MV: no ischemia/infarct, EF 61%; b. 09/2015 Low prob V:Q scan.   Morbid obesity (HCC)    Pneumonia ~ 2000   "mild case"   RBBB (right bundle branch block) approx 2010   Dr Manus Gunning did ECG and told her   Type II diabetes mellitus (HCC)    Past Surgical History:  Procedure Laterality Date   BUNIONECTOMY Left    COLONOSCOPY WITH PROPOFOL N/A 09/23/2016   Procedure: COLONOSCOPY WITH PROPOFOL;  Surgeon: Charolett Bumpers, MD;  Location: [facility];  Service: Endoscopy;  Laterality: N/A;   MULTIPLE  TOOTH EXTRACTIONS  ~ 1975   TENDON RELEASE Right    Leg   TUBAL LIGATION  ~ 1983     A IV Location/Drains/Wounds Patient Lines/Drains/Airways Status     Active Line/Drains/Airways     Name Placement date Placement time Site Days   Peripheral IV 09/24/22 18 G Anterior;Left Forearm 09/24/22  1318  Forearm  less than 1   Wound / Incision (Open or Dehisced) 05/30/22 Other (Comment) Pretibial Distal;Right 05/30/22  0830  Pretibial  117   Wound / Incision (Open or Dehisced) 05/30/22 Other (Comment) Pretibial Distal;Left 05/30/22  0830  Pretibial  117   Wound / Incision (Open or Dehisced) 05/30/22 Other (Comment) Heel Left 05/30/22   0830  Heel  117            Intake/Output Last 24 hours  Intake/Output Summary (Last 24 hours) at 09/24/2022 2334 Last data filed at 09/24/2022 1805 Gross per 24 hour  Intake 1100.32 ml  Output --  Net 1100.32 ml    Labs/Imaging Results for orders placed or performed during the hospital encounter of 09/24/22 (from the past 48 hour(s))  CBC with Differential     Status: None   Collection Time: 09/24/22  1:01 PM  Result Value Ref Range   WBC 8.4 4.0 - 10.5 K/uL   RBC 5.03 3.87 - 5.11 MIL/uL   Hemoglobin 13.9 12.0 - 15.0 g/dL   HCT 16.1 09.6 - 04.5 %   MCV 81.9 80.0 - 100.0 fL   MCH 27.6 26.0 - 34.0 pg   MCHC 33.7 30.0 - 36.0 g/dL   RDW 40.9 81.1 - 91.4 %   Platelets 191 150 - 400 K/uL   nRBC 0.0 0.0 - 0.2 %   Neutrophils Relative % 86 %   Neutro Abs 7.1 1.7 - 7.7 K/uL   Lymphocytes Relative 8 %   Lymphs Abs 0.7 0.7 - 4.0 K/uL   Monocytes Relative 5 %   Monocytes Absolute 0.4 0.1 - 1.0 K/uL   Eosinophils Relative 1 %   Eosinophils Absolute 0.1 0.0 - 0.5 K/uL   Basophils Relative 0 %   Basophils Absolute 0.0 0.0 - 0.1 K/uL   Immature Granulocytes 0 %   Abs Immature Granulocytes 0.03 0.00 - 0.07 K/uL    Comment: Performed at [facility], [address], [place], [place] [address]  Comprehensive metabolic panel     Status: Abnormal   Collection Time: 09/24/22  1:01 PM  Result Value Ref Range   Sodium 136 135 - 145 mmol/L   Potassium 3.1 (L) 3.5 - 5.1 mmol/L   Chloride 97 (L) 98 - 111 mmol/L   CO2 22 22 - 32 mmol/L   Glucose, Bld 237 (H) 70 - 99 mg/dL    Comment: Glucose reference range applies only to samples taken after fasting for at least 8 hours.   BUN 12 8 - 23 mg/dL   Creatinine, Ser 6.21 0.44 - 1.00 mg/dL   Calcium 9.1 8.9 - 30.8 mg/dL   Total Protein 7.4 6.5 - 8.1 g/dL   Albumin 3.3 (L) 3.5 - 5.0 g/dL   AST 15 15 - 41 U/L   ALT 13 0 - 44 U/L   Alkaline Phosphatase 70 38 - 126 U/L   Total Bilirubin 2.0 (H) 0.3 - 1.2 mg/dL   GFR, Estimated >65 >78  mL/min    Comment: (NOTE) Calculated using the CKD-EPI Creatinine Equation (2021)    Anion gap 17 (H) 5 - 15  Comment: Performed at [facility], [address], [place], [place] [address]  Troponin I (High Sensitivity)     Status: None   Collection Time: 09/24/22  1:01 PM  Result Value Ref Range   Troponin I (High Sensitivity) 10 <18 ng/L    Comment: (NOTE) Elevated high sensitivity troponin I (hsTnI) values and significant  changes across serial measurements may suggest ACS but many other  chronic and acute conditions are known to elevate hsTnI results.  Refer to the "Links" section for chest pain algorithms and additional  guidance. Performed at [facility], [address], [place], [place] [address]   POC CBG, ED     Status: Abnormal   Collection Time: 09/24/22  1:15 PM  Result Value Ref Range   Glucose-Capillary 204 (H) 70 - 99 mg/dL    Comment: Glucose reference range applies only to samples taken after fasting for at least 8 hours.  Resp panel by RT-PCR (RSV, Flu A&B, Covid) Anterior Nasal Swab     Status: None   Collection Time: 09/24/22  2:31 PM   Specimen: Anterior Nasal Swab  Result Value Ref Range   SARS Coronavirus 2 by RT PCR NEGATIVE NEGATIVE   Influenza A by PCR NEGATIVE NEGATIVE   Influenza B by PCR NEGATIVE NEGATIVE    Comment: (NOTE) The Xpert Xpress SARS-CoV-2/FLU/RSV plus assay is intended as an aid in the diagnosis of influenza from Nasopharyngeal swab specimens and should not be used as a sole basis for treatment. Nasal washings and aspirates are unacceptable for Xpert Xpress SARS-CoV-2/FLU/RSV testing.  Fact Sheet for Patients: BloggerCourse.com  Fact Sheet for Healthcare Providers: SeriousBroker.it  This test is not yet approved or cleared by the [place] FDA and has been authorized for detection and/or diagnosis of SARS-CoV-2 by FDA under an Emergency Use Authorization (EUA).  This EUA will remain in effect (meaning this test can be used) for the duration of the COVID-19 declaration under Section 564(b)(1) of the Act, 21 U.S.C. section 360bbb-3(b)(1), unless the authorization is terminated or revoked.     Resp Syncytial Virus by PCR NEGATIVE NEGATIVE    Comment: (NOTE) Fact Sheet for Patients: BloggerCourse.com  Fact Sheet for Healthcare Providers: SeriousBroker.it  This test is not yet approved or cleared by the [place] FDA and has been authorized for detection and/or diagnosis of SARS-CoV-2 by FDA under an Emergency Use Authorization (EUA). This EUA will remain in effect (meaning this test can be used) for the duration of the COVID-19 declaration under Section 564(b)(1) of the Act, 21 U.S.C. section 360bbb-3(b)(1), unless the authorization is terminated or revoked.  Performed at [facility], [address] [place], [place] [address]   Lactic acid, plasma     Status: None   Collection Time: 09/24/22  2:32 PM  Result Value Ref Range   Lactic Acid, Venous 1.3 0.5 - 1.9 mmol/L    Comment: Performed at [facility], [address] [place], [place] [address]  D-dimer, quantitative     Status: Abnormal   Collection Time: 09/24/22  2:45 PM  Result Value Ref Range   D-Dimer, Quant 0.95 (H) 0.00 - 0.50 ug/mL-FEU    Comment: (NOTE) At the manufacturer cut-off value of 0.5 g/mL FEU, this assay has a negative predictive value of 95-100%.This assay is intended for use in conjunction with a clinical pretest probability (PTP) assessment model to exclude pulmonary embolism (PE) and deep venous thrombosis (DVT) in outpatients suspected of PE  or DVT. Results should be correlated with clinical presentation. Performed at [facility], [address] [place], [place] [address]   Troponin I (High Sensitivity)     Status: None   Collection Time: 09/24/22  3:01 PM  Result Value Ref Range    Troponin I (High Sensitivity) 15 <18 ng/L    Comment: (NOTE) Elevated high sensitivity troponin I (hsTnI) values and significant  changes across serial measurements may suggest ACS but many other  chronic and acute conditions are known to elevate hsTnI results.  Refer to the "Links" section for chest pain algorithms and additional  guidance. Performed at [facility], [address] [place], [place] [address]   CK     Status: Abnormal   Collection Time: 09/24/22  3:01 PM  Result Value Ref Range   Total CK 20 (L) 38 - 234 U/L    Comment: Performed at [facility], [address] [place], [place] [address]  Brain natriuretic peptide     Status: None   Collection Time: 09/24/22  3:01 PM  Result Value Ref Range   B Natriuretic Peptide 77.1 0.0 - 100.0 pg/mL    Comment: Performed at [facility], [address], [place], [place] [address]  Urinalysis, Routine w reflex microscopic -Urine, Clean Catch     Status: Abnormal   Collection Time: 09/24/22  9:30 PM  Result Value Ref Range   Color, Urine YELLOW YELLOW   APPearance CLOUDY (A) CLEAR   Specific Gravity, Urine 1.014 1.005 - 1.030   pH 7.0 5.0 - 8.0   Glucose, UA 150 (A) NEGATIVE mg/dL   Hgb urine dipstick NEGATIVE NEGATIVE   Bilirubin Urine NEGATIVE NEGATIVE   Ketones, ur 5 (A) NEGATIVE mg/dL   Protein, ur 696 (A) NEGATIVE mg/dL   Nitrite POSITIVE (A) NEGATIVE   Leukocytes,Ua SMALL (A) NEGATIVE   RBC / HPF 6-10 0 - 5 RBC/hpf   WBC, UA 21-50 0 - 5 WBC/hpf   Bacteria, UA FEW (A) NONE SEEN   Squamous Epithelial / HPF 0-5 0 - 5 /HPF   Mucus PRESENT     Comment: Performed at [facility], [address], [place], [place] [address]   VAS [place] LOWER EXTREMITY VENOUS (DVT) (7a-7p)  Result Date: 09/24/2022  Lower Venous DVT Study Patient Name:  Amanda Butler  Date of Exam:   09/24/2022 Medical Rec #: [id number]        Accession #:    [id number] Date of Birth: [birth year]         Patient Gender: F Patient Age:    89 years Exam Location:  [facility] Procedure:      VAS [place] LOWER EXTREMITY VENOUS (DVT) Referring Phys: HAYLEY NAASZ --------------------------------------------------------------------------------  Indications: Left lower extremity pain, swelling, erythema.  Comparison Study: Multiple prior studies- most recent prior study 06-06-2021 was                   negative for DVT. Performing Technologist: Jean Rosenthal RDMS, RVT  Examination Guidelines: A complete evaluation includes B-mode imaging, spectral Doppler, color Doppler, and power Doppler as needed of all accessible portions of each vessel. Bilateral testing is considered an integral part of a complete examination. Limited examinations for reoccurring indications may be performed as noted. The reflux portion of the exam is performed with the patient in reverse Trendelenburg.  +-----+---------------+---------+-----------+----------+--------------+ RIGHTCompressibilityPhasicitySpontaneityPropertiesThrombus Aging +-----+---------------+---------+-----------+----------+--------------+ CFV  Full  Yes      Yes                                 +-----+---------------+---------+-----------+----------+--------------+   +---------+---------------+---------+-----------+----------+--------------+ LEFT     CompressibilityPhasicitySpontaneityPropertiesThrombus Aging +---------+---------------+---------+-----------+----------+--------------+ CFV      Full           Yes      Yes                                 +---------+---------------+---------+-----------+----------+--------------+ SFJ      Full                                                        +---------+---------------+---------+-----------+----------+--------------+ FV Prox  Full                                                        +---------+---------------+---------+-----------+----------+--------------+ FV Mid   Full                                                         +---------+---------------+---------+-----------+----------+--------------+ FV DistalFull                                                        +---------+---------------+---------+-----------+----------+--------------+ PFV      Full                                                        +---------+---------------+---------+-----------+----------+--------------+ POP      Full           Yes      Yes                                 +---------+---------------+---------+-----------+----------+--------------+ PTV      Full                                                        +---------+---------------+---------+-----------+----------+--------------+ PERO     Full                                                        +---------+---------------+---------+-----------+----------+--------------+  Summary: RIGHT: - No evidence of common femoral vein obstruction.  LEFT: - There is no evidence of deep vein thrombosis in the lower extremity.  - No cystic structure found in the popliteal fossa.  *See table(s) above for measurements and observations. Electronically signed by Heath Lark on 09/24/2022 at 5:00:23 PM.    Final    CT Lumbar Spine Wo Contrast  Result Date: 09/24/2022 CLINICAL DATA:  Back trauma.  Fall with back pain. EXAM: CT LUMBAR SPINE WITHOUT CONTRAST TECHNIQUE: Multidetector CT imaging of the lumbar spine was performed without intravenous contrast administration. Multiplanar CT image reconstructions were also generated. RADIATION DOSE REDUCTION: This exam was performed according to the departmental dose-optimization program which includes automated exposure control, adjustment of the mA and/or kV according to patient size and/or use of iterative reconstruction technique. COMPARISON:  None Available. FINDINGS: Segmentation: Conventional numbering is assumed with 5 non-rib-bearing, lumbar type vertebral bodies. Alignment: Trace anterolisthesis of L4  on L5, likely degenerative. No traumatic malalignment. Vertebrae: Normal vertebral body heights. No acute fracture or suspicious bone lesion. 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 Paraspinal and other soft tissues: 4 mm nonobstructing stone in the upper pole left kidney. Aortic atherosclerosis. Fatty atrophy of the paraspinal muscles. Disc levels: T12-L1: Calcified left central/subarticular disc-osteophyte complex results in mild spinal canal stenosis and mild left neural foraminal narrowing. L1-L2: Disc bulge and facet arthropathy results in mild spinal canal stenosis. L2-L3: Partially calcified disc bulge and facet arthropathy results in moderate spinal canal stenosis and mild bilateral neural foraminal narrowing. L3-L4: Disc bulge and severe bilateral facet arthropathy results in moderate-to-severe spinal canal stenosis, severe left and moderate right neural foraminal narrowing. L4-L5: Anterolisthesis, disc bulge and severe bilateral facet arthropathy results in severe spinal canal stenosis and moderate-to-severe right neural foraminal narrowing. L5-S1: Partially calcified central disc protrusion. No significant spinal canal stenosis or neural foraminal narrowing. IMPRESSION: 1. No acute fracture or traumatic malalignment. 2. 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 3. Moderate to severe neural foraminal narrowing, as detailed above. Aortic Atherosclerosis (ICD10-I70.0). Electronically Signed   By: Orvan Falconer M.D.   On: 09/24/2022 15:18   DG Chest Portable 1 View  Result Date: 09/24/2022 CLINICAL DATA:  fall, CP EXAM: PORTABLE CHEST 1 VIEW COMPARISON:  CXR 06/08/22 FINDINGS: No pleural effusion. No pneumothorax. Normal cardiac and mediastinal contours. No focal airspace opacity. No  radiographically apparent displaced rib fractures. Visualized upper abdomen is unremarkable. Degenerative changes of the bilateral AC joints. IMPRESSION: No radiographically apparent displaced rib fractures. If there is persistent clinical concern, consider further evaluation with a dedicated rib series or CT. Electronically Signed   By: Lorenza Cambridge M.D.   On: 09/24/2022 15:06    Pending Labs Unresulted Labs (From admission, onward)     Start     Ordered   09/25/22 0500  CBC with Differential/Platelet  Tomorrow morning,   R        09/24/22 2315   09/25/22 0500  Comprehensive metabolic panel  Tomorrow morning,   R        09/24/22 2315   09/25/22 0500  Magnesium  Tomorrow morning,   R        09/24/22 2315   09/24/22 2316  Magnesium  Add-on,   AD        09/24/22 2315   09/24/22 1432  Lactic  acid, plasma  Now then every 2 hours,   R      09/24/22 1431            Vitals/Pain Today's Vitals   09/24/22 1830 09/24/22 2030 09/24/22 2054 09/24/22 2130  BP: (!) 146/63 (!) 143/62  (!) 153/72  Pulse: 67 63  60  Resp: (!) 21 19  20   Temp:   98.3 F (36.8 C)   TempSrc:   Oral   SpO2: 98% 95%  97%    Isolation Precautions No active isolations  Medications Medications  carvedilol (COREG) tablet 6.25 mg (6.25 mg Oral Given 09/24/22 1334)  diphenhydrAMINE (BENADRYL) capsule 50 mg (has no administration in time range)    Or  diphenhydrAMINE (BENADRYL) injection 50 mg (has no administration in time range)  acetaminophen (TYLENOL) tablet 650 mg (has no administration in time range)    Or  acetaminophen (TYLENOL) suppository 650 mg (has no administration in time range)  melatonin tablet 3 mg (has no administration in time range)  ondansetron (ZOFRAN) injection 4 mg (has no administration in time range)  cefTRIAXone (ROCEPHIN) 1 g in sodium chloride 0.9 % 100 mL IVPB (has no administration in time range)  lisinopril (ZESTRIL) tablet 40 mg (40 mg Oral Given 09/24/22 1334)  labetalol  (NORMODYNE) injection 5 mg (5 mg Intravenous Given 09/24/22 1506)  lactated ringers bolus 1,000 mL (0 mLs Intravenous Stopped 09/24/22 1629)  cefTRIAXone (ROCEPHIN) 1 g in sodium chloride 0.9 % 100 mL IVPB (0 g Intravenous Stopped 09/24/22 1805)  methylPREDNISolone sodium succinate (SOLU-MEDROL) 40 mg/mL injection 40 mg (40 mg Intravenous Given 09/24/22 2052)    Mobility walks with device     Focused Assessments     R Recommendations: See Admitting Provider Note  Report given to:   Additional Notes: Pt came in for a fall. As of now SW is working on finding a place for her. She uses a walker to ambulate but still might need assistance due to being unsteady.

## 2022-09-24 NOTE — Progress Notes (Signed)
Lower extremity venous left study completed.  Preliminary results relayed to Jearld Fenton, MD.  See CV Proc for preliminary results report.   Jean Rosenthal, RDMS, RVT

## 2022-09-24 NOTE — H&P (Signed)
History and Physical      Amanda Butler AOZ:[id number] DOB: December 27, 1955 DOA: 09/24/2022; DOS: 09/24/2022  PCP: Blair Heys, MD  Patient coming from: home   I have personally briefly reviewed patient's old medical records in [facility]  Chief Complaint: Generalized weakness  HPI: Amanda Butler is a 67 y.o. female with medical history significant for chronic diastolic heart failure, essential pretension, hyperlipidemia, type 2 diabetes mellitus, who is admitted to [facility] on 09/24/2022 with generalized weakness after presenting from home to [facility] ED complaining of generalized weakness.   The patient conveys 3 to 4 days of progressive generalized weakness in the absence of any acute focal weakness.  Over that timeframe, she also notes new onset dysuria in the absence of any gross hematuria or change in urinary urgency/frequency.  She also notes associated subjective fever in the absence of chills, full body rigors, or generalized myalgias.  Not associate with any headache, neck stiffness, rash, cough, abdominal pain, or diarrhea.  No recent chest pain, palpitations, diaphoresis, dizziness, presyncope, syncope, nausea, vomiting.  She notes worsening of chronic edema involving the bilateral lower extremities, slightly worse on the left relative to the right.  She notes some mild recent shortness of breath, but in the absence of any orthopnea or PND.  No significant cough, denies any recent hemoptysis.  She notes that she recently ran out of several of her outpatient medications, and has been unable to procure refills in the setting of her generalized weakness.  In the setting of her progressive generalized weakness, the patient conveys progressive difficulty with ambulation over the course the last 3 or 4 days.  She notes that this is also in the context of living at home, and states that this progressive generalized weakness is also diminished her ability to perform her own ADLs  over that timeframe.     ED Course:  Vital signs in the ED were notable for the following: Afebrile; heart rates in the range of 60s to 80s; systolic blood pressures initially in the low 200s, subsequently decreasing into the range of 140s to 150s following resumption of home antihypertensive medications, as further detailed below, respiratory rate 16-21, oxygen saturation 95 to 100% on room air.  Labs were notable for the following: CMP notable for the following: Sodium 136, potassium 3.1, creatinine 0.79 compared rashes and prior serum creatinine data point of 0.60 on 08/03/2022, glucose 237, total bilirubin 2.0, otherwise, liver enzymes within normal limits.  High sensitive troponin I x 2 values nonelevated.  D-dimer 0.95.  CPK less than 20, BNP 77 compared to most recent prior BNP data point is 81 in February 2024, COVID, RSV, influenza PCR were all negative.  Lactic acid 1.3.  CBC notable for white blood cell count 8400.  Urinalysis was associated with a cloudy appearing specimen was notable for 21-50 white blood cells, nitrate positive, small leukocyte esterase, no squamous epithelial cells.  Per my interpretation, EKG in ED demonstrated the following:, Comparison to most recent prior EKG from 08/03/2022, presenting EKG shows sinus rhythm with right bundle branch block, rate 88, prolonged QTc of 523, T wave inversion in leads V1 through V4, of which T wave version in V1 through V3 appear unchanged from most recent prior EKG, and demonstrates no evidence of ST changes, including no evidence of ST elevation.  Imaging and additional notable ED work-up: Venous ultrasounds of the bilateral lower extremity showed no evidence of acute DVT.  Low he just x-ray shows no  evidence of acute cardiopulmonary process, including no evidence of infiltrate, edema, effusion, or pneumothorax.  CT lumbar spine showed no evidence of acute fracture or traumatic malalignment.  CTA chest has been ordered, with result  currently pending.  While in the ED, the following were administered: Coreg 6.25 mg p.o. x 1, Solu-Medrol 40 mg IV x 1, Benadryl 50 mg x 1 dose, labetalol 5 mg IV x 1, lisinopril 40 mg p.o. x 1, Rocephin, lactated Ringer's x 1 L bolus.  Subsequently, the patient was admitted for further evaluation and management of presenting generalized weakness in the setting of suspected acute cystitis, with presenting labs also notable for hypokalemia as well as an elevated D-dimer, and additional workup notable for QTc prolongation.     Review of Systems: As per HPI otherwise 10 point review of systems negative.   Past Medical History:  Diagnosis Date   Arthritis    "knees" (09/13/2015)   Asthma    Cellulitis 11/17/2018   LEFT LEG    Chronic diastolic CHF (congestive heart failure) (HCC)    a. 09/2015 Echo: EF 55-60%, mild LVH, gr2DD, no rwma, mild MR, mildly dil LA.    GERD (gastroesophageal reflux disease)    High cholesterol    Hypertension    Midsternal chest pain    a. 09/2015 MV: no ischemia/infarct, EF 61%; b. 09/2015 Low prob V:Q scan.   Morbid obesity (HCC)    Pneumonia ~ 2000   "mild case"   RBBB (right bundle branch block) approx 2010   Dr Manus Gunning did ECG and told her   Type II diabetes mellitus (HCC)     Past Surgical History:  Procedure Laterality Date   BUNIONECTOMY Left    COLONOSCOPY WITH PROPOFOL N/A 09/23/2016   Procedure: COLONOSCOPY WITH PROPOFOL;  Surgeon: Charolett Bumpers, MD;  Location: [facility] ENDOSCOPY;  Service: Endoscopy;  Laterality: N/A;   MULTIPLE TOOTH EXTRACTIONS  ~ 1975   TENDON RELEASE Right    Leg   TUBAL LIGATION  ~ 1983    Social History:  reports that she has never smoked. She has never used smokeless tobacco. She reports that she does not drink alcohol and does not use drugs.   Allergies  Allergen Reactions   Aspirin Hives and Shortness Of Breath   Bee Venom Anaphylaxis   Ibuprofen Anaphylaxis and Swelling    Swelling of throat   Iodine Shortness  Of Breath and Rash   Metformin And Related Swelling and Other (See Comments)    Patient reports flu-like symptoms and fatigue Swelling of throat   Nsaids Anaphylaxis, Hives, Swelling and Other (See Comments)    Swelling of throat- Tylenol is tolerated   [place] Shortness Of Breath, Itching, Rash and Other (See Comments)    Turnip greens, collard greens, pickles, and squash   Other Shortness Of Breath and Other (See Comments)    Reaction to "X-ray dye"   Shellfish Allergy Anaphylaxis   Actos [Pioglitazone] Other (See Comments)    Erratic heartbeat   Celecoxib Other (See Comments)    Unknown reaction   Latex Hives   Morphine And Codeine Other (See Comments)    Childhood allergy (67 years old) , pt got sicker , temp went up, dr gave her the wrong medication   Ciprofloxacin Palpitations    Joint pain    Family History  Problem Relation Age of Onset   Cancer Mother    Diabetes Mother    Hypertension Mother    Diabetes Father  Hypertension Father    Heart attack Father 8   Diabetes Maternal Grandmother    Hypertension Maternal Grandmother    Cancer Maternal Grandfather    Diabetes Maternal Grandfather    Hypertension Maternal Grandfather    Asthma Paternal Grandmother    Diabetes Paternal Grandmother    Hypertension Paternal Grandmother    Diabetes Paternal Grandfather    Hypertension Paternal Grandfather    Heart failure Paternal Grandfather    Diabetes Maternal Aunt     Family history reviewed and not pertinent    Prior to Admission medications   Medication Sig Start Date End Date Taking? Authorizing Provider  acetaminophen (TYLENOL) 500 MG tablet Take 1,000 mg by mouth 2 (two) times daily as needed (pain).    [provider]  albuterol (PROVENTIL) (2.5 MG/3ML) 0.083% nebulizer solution Take 2.5 mg by nebulization See admin instructions. Take 2.5 mg by mouth 1-2 times a day as needed for wheezing and SOB    [provider]  albuterol (VENTOLIN HFA) 108  (90 Base) MCG/ACT inhaler Inhale 2 puffs into the lungs 2 (two) times daily as needed for shortness of breath or wheezing. 01/14/21   [provider]  carvedilol (COREG) 6.25 MG tablet Take 1 tablet (6.25 mg total) by mouth 2 (two) times daily with a meal. 10/16/21   Almon Hercules, MD  fenofibrate 54 MG tablet Take 1 tablet (54 mg total) by mouth daily. Patient taking differently: Take 54 mg by mouth every morning. 01/24/21 06/04/22  Narda Bonds, MD  furosemide (LASIX) 40 MG tablet Take 1 tablet (40 mg total) by mouth daily. 06/02/22 08/31/22  Rodolph Bong, MD  glimepiride (AMARYL) 4 MG tablet Take 2 tablets (8 mg total) by mouth every morning. 06/02/22 06/02/23  Rodolph Bong, MD  lisinopril (ZESTRIL) 40 MG tablet Take 1 tablet (40 mg total) by mouth daily. 06/02/22   Rodolph Bong, MD  Mupirocin [facility] EX) Apply 1 Application topically daily.    [provider]  Oxymetazoline HCl (NASAL SPRAY NA) Place 2 sprays into the nose 2 (two) times daily as needed (asthma, runny nose).    [provider]  pantoprazole (PROTONIX) 40 MG tablet Take 1 tablet (40 mg total) by mouth daily at 6 (six) AM. 06/03/22   Rodolph Bong, MD  repaglinide (PRANDIN) 1 MG tablet Take 1 mg by mouth daily. 08/19/18   [provider]  rosuvastatin (CRESTOR) 20 MG tablet Take 20 mg by mouth every morning.    [provider]  Skin Protectants, Misc. (EUCERIN) cream Apply 1 application  topically daily as needed for dry skin.    [provider]     Objective    Physical Exam: Vitals:   09/24/22 1830 09/24/22 2030 09/24/22 2054 09/24/22 2130  BP: (!) 146/63 (!) 143/62  (!) 153/72  Pulse: 67 63  60  Resp: (!) 21 19  20   Temp:   98.3 F (36.8 C)   TempSrc:   Oral   SpO2: 98% 95%  97%    General: appears to be stated age; alert, oriented Skin: warm, dry, no rash Head:  AT/[place] Mouth:  Oral mucosa membranes appear moist, normal dentition Neck:  supple; trachea midline Heart:  RRR; did not appreciate any M/R/G Lungs: CTAB, did not appreciate any wheezes, rales, or rhonchi Abdomen: + BS; soft, ND, NT Vascular: 2+ pedal pulses b/l; 2+ radial pulses b/l Extremities: Trace edema in the bilateral lower extremities, no muscle wasting Neuro:  strength and sensation intact in upper and lower extremities b/l    Labs on Admission: I have personally reviewed following labs and imaging studies  CBC: Recent Labs  Lab 09/24/22 1301  WBC 8.4  NEUTROABS 7.1  HGB 13.9  HCT 41.2  MCV 81.9  PLT 191   Basic Metabolic Panel: Recent Labs  Lab 09/24/22 1301  NA 136  K 3.1*  CL 97*  CO2 22  GLUCOSE 237*  BUN 12  CREATININE 0.79  CALCIUM 9.1   GFR: CrCl cannot be calculated (Unknown ideal weight.). Liver Function Tests: Recent Labs  Lab 09/24/22 1301  AST 15  ALT 13  ALKPHOS 70  BILITOT 2.0*  PROT 7.4  ALBUMIN 3.3*   No results for input(s): "LIPASE", "AMYLASE" in the last 168 hours. No results for input(s): "AMMONIA" in the last 168 hours. Coagulation Profile: No results for input(s): "INR", "PROTIME" in the last 168 hours. Cardiac Enzymes: Recent Labs  Lab 09/24/22 1501  CKTOTAL 20*   BNP (last 3 results) No results for input(s): "PROBNP" in the last 8760 hours. HbA1C: No results for input(s): "HGBA1C" in the last 72 hours. CBG: Recent Labs  Lab 09/24/22 1315  GLUCAP 204*   Lipid Profile: No results for input(s): "CHOL", "HDL", "LDLCALC", "TRIG", "CHOLHDL", "LDLDIRECT" in the last 72 hours. Thyroid Function Tests: No results for input(s): "TSH", "T4TOTAL", "FREET4", "T3FREE", "THYROIDAB" in the last 72 hours. Anemia Panel: No results for input(s): "VITAMINB12", "FOLATE", "FERRITIN", "TIBC", "IRON", "RETICCTPCT" in the last 72 hours. Urine analysis:    Component Value Date/Time   COLORURINE YELLOW 09/24/2022 2130   APPEARANCEUR CLOUDY (A) 09/24/2022 2130   LABSPEC 1.014 09/24/2022 2130   PHURINE 7.0  09/24/2022 2130   GLUCOSEU 150 (A) 09/24/2022 2130   HGBUR NEGATIVE 09/24/2022 2130   BILIRUBINUR NEGATIVE 09/24/2022 2130   KETONESUR 5 (A) 09/24/2022 2130   PROTEINUR 100 (A) 09/24/2022 2130   NITRITE POSITIVE (A) 09/24/2022 2130   LEUKOCYTESUR SMALL (A) 09/24/2022 2130    Radiological Exams on Admission: VAS [place] LOWER EXTREMITY VENOUS (DVT) (7a-7p)  Result Date: 09/24/2022  Lower Venous DVT Study Patient Name:  Amanda Butler  Date of Exam:   09/24/2022 Medical Rec #: [id number]        Accession #:    [id number] Date of Birth: 22-Oct-1955         Patient Gender: F Patient Age:   21 years Exam Location:  [facility] Procedure:      VAS [place] LOWER EXTREMITY VENOUS (DVT) Referring Phys: HAYLEY NAASZ --------------------------------------------------------------------------------  Indications: Left lower extremity pain, swelling, erythema.  Comparison Study: Multiple prior studies- most recent prior study 05/30/2021 was                   negative for DVT. Performing Technologist: Jean Rosenthal RDMS, RVT  Examination Guidelines: A complete evaluation includes B-mode imaging, spectral Doppler, color Doppler, and power Doppler as needed of all accessible portions of each vessel. Bilateral testing is considered an integral part of a complete examination. Limited examinations for reoccurring indications may be performed as noted. The reflux portion of the exam is performed with the patient in reverse Trendelenburg.  +-----+---------------+---------+-----------+----------+--------------+ RIGHTCompressibilityPhasicitySpontaneityPropertiesThrombus Aging +-----+---------------+---------+-----------+----------+--------------+ CFV  Full           Yes      Yes                                 +-----+---------------+---------+-----------+----------+--------------+   +---------+---------------+---------+-----------+----------+--------------+  LEFT      CompressibilityPhasicitySpontaneityPropertiesThrombus Aging +---------+---------------+---------+-----------+----------+--------------+ CFV      Full           Yes      Yes                                 +---------+---------------+---------+-----------+----------+--------------+ SFJ      Full                                                        +---------+---------------+---------+-----------+----------+--------------+ FV Prox  Full                                                        +---------+---------------+---------+-----------+----------+--------------+ FV Mid   Full                                                        +---------+---------------+---------+-----------+----------+--------------+ FV DistalFull                                                        +---------+---------------+---------+-----------+----------+--------------+ PFV      Full                                                        +---------+---------------+---------+-----------+----------+--------------+ POP      Full           Yes      Yes                                 +---------+---------------+---------+-----------+----------+--------------+ PTV      Full                                                        +---------+---------------+---------+-----------+----------+--------------+ PERO     Full                                                        +---------+---------------+---------+-----------+----------+--------------+     Summary: RIGHT: - No evidence of common femoral vein obstruction.  LEFT: - There is no evidence of deep vein thrombosis in the lower extremity.  - No cystic structure found in the popliteal fossa.  *See table(s) above for measurements and observations.  Electronically signed by Heath Lark on 09/24/2022 at 5:00:23 PM.    Final    CT Lumbar Spine Wo Contrast  Result Date: 09/24/2022 CLINICAL DATA:  Back trauma.  Fall with back  pain. EXAM: CT LUMBAR SPINE WITHOUT CONTRAST TECHNIQUE: Multidetector CT imaging of the lumbar spine was performed without intravenous contrast administration. Multiplanar CT image reconstructions were also generated. RADIATION DOSE REDUCTION: This exam was performed according to the departmental dose-optimization program which includes automated exposure control, adjustment of the mA and/or kV according to patient size and/or use of iterative reconstruction technique. COMPARISON:  None Available. FINDINGS: Segmentation: Conventional numbering is assumed with 5 non-rib-bearing, lumbar type vertebral bodies. Alignment: Trace anterolisthesis of L4 on L5, likely degenerative. No traumatic malalignment. Vertebrae: Normal vertebral body heights. No acute fracture or suspicious bone lesion. 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 Paraspinal and other soft tissues: 4 mm nonobstructing stone in the upper pole left kidney. Aortic atherosclerosis. Fatty atrophy of the paraspinal muscles. Disc levels: T12-L1: Calcified left central/subarticular disc-osteophyte complex results in mild spinal canal stenosis and mild left neural foraminal narrowing. L1-L2: Disc bulge and facet arthropathy results in mild spinal canal stenosis. L2-L3: Partially calcified disc bulge and facet arthropathy results in moderate spinal canal stenosis and mild bilateral neural foraminal narrowing. L3-L4: Disc bulge and severe bilateral facet arthropathy results in moderate-to-severe spinal canal stenosis, severe left and moderate right neural foraminal narrowing. L4-L5: Anterolisthesis, disc bulge and severe bilateral facet arthropathy results in severe spinal canal stenosis and moderate-to-severe right neural foraminal narrowing. L5-S1: Partially calcified central disc protrusion. No significant spinal canal stenosis or neural foraminal narrowing. IMPRESSION: 1. No acute fracture or traumatic malalignment. 2. 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 3. Moderate to severe neural foraminal narrowing, as detailed above. Aortic Atherosclerosis (ICD10-I70.0). Electronically Signed   By: Orvan Falconer M.D.   On: 09/24/2022 15:18   DG Chest Portable 1 View  Result Date: 09/24/2022 CLINICAL DATA:  fall, CP EXAM: PORTABLE CHEST 1 VIEW COMPARISON:  CXR 06/08/22 FINDINGS: No pleural effusion. No pneumothorax. Normal cardiac and mediastinal contours. No focal airspace opacity. No radiographically apparent displaced rib fractures. Visualized upper abdomen is unremarkable. Degenerative changes of the bilateral AC joints. IMPRESSION: No radiographically apparent displaced rib fractures. If there is persistent clinical concern, consider further evaluation with a dedicated rib series or CT. Electronically Signed   By: Lorenza Cambridge M.D.   On: 09/24/2022 15:06      Assessment/Plan   Principal Problem:   Generalized weakness Active Problems:   GERD (gastroesophageal reflux disease)   Essential hypertension   DM2 (diabetes mellitus, type 2) (HCC)   Hyperlipidemia   Chronic diastolic CHF (congestive heart failure) (HCC)   Hypokalemia   Prolonged QT interval   Acute cystitis   Positive D dimer         #) Generalized weakness:  3-4 day  duration of generalized weakness, in the absence of any evidence of acute focal neurologic deficits, including no evidence of acute focal weakness to suggest acute CVA.  Suspect contribution from physiologic stress stemming from presenting acute cystitis.  No e/o additional infectious process at this time, including chest x-ray, which showed no evidence of acute cardiopulmonary process.  There may also be an element of generalized deconditioning for which the patient would benefit from PT/OT.  No evidence  of rhabdomyolysis, with presenting CPK level not elevated.  Will further eval for any additional contributions from endocrine/metabolic sources, as detailed below.   Of note, the patient's generalized weakness Has resulted in new decline in pt's ability to contribute to ADL completion relative to baseline, in the context of the patient living at home.  Plan: work-up and management of presenting acute cystitis, as described below. PT/OT consults ordered for the AM. Fall precautions. CMP/CBC in the AM. Check TSH, serum Mg level. Check B12 level.               #) Acute cystitis: Diagnosis on the basis of 3 to 4 days of dysuria associated with her generalized weakness, subjective fever, with presenting urinalysis notable for significant pyuria positive nitrite, in the absence of any associated squamous epithelial cells to suggest a contaminated specimen.  Of note, in the absence of objective fever in the absence of leukocytosis, SIRS criteria not currently met for sepsis.  Additionally, lactic acid level found to be nonelevated.  No evidence of hypotension.  Was started on Rocephin in the ED, which will be continued for empiric coverage of acute cystitis in the setting of this presenting and complicated UTI, warranting 3 to 5 days of antibiotic coverage.  Plan: Add on urine culture.  Continue Rocephin, as above.  Repeat CBC in the morning.              #) Elevated D-dimer: In the setting of the patient's report of mild worsening of her chronic edema in the bilateral lower extremities, D-dimer was checked in the ED and found to be elevated.  This prompted the ordering a CTA chest in the ED, which is currently pending as the patient requires pretreatment in the setting of a documented history of contrast allergy.  As such, she is receiving Solu-Medrol as well as Benadryl, as further outlined above.  Of note, not on any blood thinners as an outpatient.  Plan: Will follow for result of  CTA chest, as above.                 #) Hypokalemia: presenting potassium level noted to be 3.1.    Plan: monitor on tele. KCl 40 meq p.o. x 1 dose now. Add-on serum mag level. CMP, mag level in the AM.                    #) QTc prolongation: Presenting EKG demonstrates QTc of 523 ms.   Plan: Monitor on telemetry.  Add-on serum magnesium level.  Repeat EKG in the morning to monitor interval degree of QTc prolongation.                     #) Chronic diastolic heart failure: documented history of such, with most recent echocardiogram performed in January 2023, which was notable for LVEF 60 to 65%, no focal wall motion arise, grade 2 diastolic dysfunction, and normal right ventricular systolic function.. No clinical or radiographic evidence to suggest acutely decompensated heart failure at this time, including nonelevated BNP, which is actually lower than most recent prior value from February 2024, will presenting chest x-ray shows no evidence of acute cardiopulmonary process. home diuretic regimen reportedly consists of the following: Lasix 40 mg p.o. daily.   Plan: monitor strict I's & O's and daily weights. Repeat CMP in AM. Check serum mag level. Continue home diuretic regimen.                   #)  GERD: documented h/o such; on Protonix as outpatient.   Plan: continue home PPI.                   #) Essential Hypertension: documented h/o such, with outpatient antihypertensive regimen including Coreg, lisinopril, Lasix.  SBP's in the ED today: Initially in the low 200s, subsequently improving into the 140s to 150s after resumption of outpatient and hypertensive medications, mmHg. This trend in blood pressure, including the response thereof to resumption of outpatient hypertension medications appears consistent with the patient's report of missing multiple recent doses of her outpatient and hypertensive medications in  setting of her generalized weakness.  Not associate any acute focal neurologic deficits, and no associated evidence to suggest endorgan damage.  Plan: Close monitoring of subsequent BP via routine VS. resume outpatient Coreg, lisinopril, Lasix.  Monitor strict I's and O's and daily weights.                   #) Hyperlipidemia: documented h/o such. On high intensity rosuvastatin as well as fenofibrate as outpatient.  Of note, CPK level found to be nonelevated, as quantified above.  Plan: continue home statin and fenofibrate.                   #) Type 2 Diabetes Mellitus: documented history of such. Home insulin regimen: None. Home oral hypoglycemic agents: Repaglinide, glimepiride. presenting blood sugar: 237. Most recent A1c noted to be 9.8% when checked in February 2024.   Plan: accuchecks QAC and HS with low dose SSI.  Add on hemoglobin A1c level. hold home oral hypoglycemic agents during this hospitalization.        DVT prophylaxis: SCD's   Code Status: Full code Family Communication: none Disposition Plan: Per Rounding Team Consults called: none;  Admission status: inpatient     I SPENT GREATER THAN 75  MINUTES IN CLINICAL CARE TIME/MEDICAL DECISION-MAKING IN COMPLETING THIS ADMISSION.      Chaney Born Armando Lauman DO [facility]  From 7PM - 7AM   09/24/2022, 11:58 PM

## 2022-09-24 NOTE — ED Notes (Signed)
Pt gone to CT 

## 2022-09-24 NOTE — ED Notes (Signed)
Pt has skin break down underneath each breast

## 2022-09-24 NOTE — Progress Notes (Signed)
Chaplain paged to bring pt a Bible. Upon arrival pt RN notified chaplain that pt was in CT. Bible left with pt's belongings in pt room.   Please page as further needs arise.  Maryanna Shape. Carley Hammed, M.Div. [facility] Chaplain Pager [phone] Office [phone]

## 2022-09-24 NOTE — ED Notes (Signed)
Patient transported to CT 

## 2022-09-24 NOTE — ED Provider Notes (Signed)
[place] EMERGENCY DEPARTMENT AT [facility] Provider Note   CSN: [id number] Arrival date & time: 09/24/22  1241     History  No chief complaint on file.   Amanda Butler is a 67 y.o. female with HTN, asthma, GERD, obesity, T2DM, chronic diastolic CHF, h/o CVA who presents with fall, HTN, generalized weakness, living situation.   Patient states that she was going to come to the hospital today to come get checked out because she "hasn't been feeling well" lately. She tripped when she was on the way, walker slipped out from under her, she fell and couldn't get up. She didn't hit her head, landed on her bottom in a sitting position. Has some pain in her lumbar back as a result of that fall, didn't have the back pain before that. Couldn't get up, didn't have the strength, so had to call EMS.  Hasn't been feeling well x 4 days. "Sluggish" and generally very weak, with cough, dizzy. No f/c, SOB. Also reports chest pain that started when she was trying to get up from the floor. Hurts in left lower area, rated 5/10, nonradiating, constant. Decreased PO intake as well for the last few days. Had a few episodes of diarrhea this weekend, which has since improved. Feels dehydrated.  Patient had her water turned off by the city a couple weeks ago. On EMS arrival, she had garbage stacked to the ceiling, lots of animals in the home and ticks/insects, home was alarming to EMS/PD. PD told her she cannot return to the home and needs to find living arrangements. States the last 6 months or so "haven't been right." Her daughter used to live with her but also had to go to nursing home. On arrival, her BP is 200 mmHg. She didn't take her BP meds this AM because she fell, but otherwise has been compliant with her meds.    HPI     Home Medications Prior to Admission medications   Medication Sig Start Date End Date Taking? Authorizing Provider  acetaminophen (TYLENOL) 500 MG tablet Take 1,000 mg by  mouth 2 (two) times daily as needed (pain).    [provider]  albuterol (PROVENTIL) (2.5 MG/3ML) 0.083% nebulizer solution Take 2.5 mg by nebulization See admin instructions. Take 2.5 mg by mouth 1-2 times a day as needed for wheezing and SOB    [provider]  albuterol (VENTOLIN HFA) 108 (90 Base) MCG/ACT inhaler Inhale 2 puffs into the lungs 2 (two) times daily as needed for shortness of breath or wheezing. 01/14/21   [provider]  carvedilol (COREG) 6.25 MG tablet Take 1 tablet (6.25 mg total) by mouth 2 (two) times daily with a meal. 10/16/21   Almon Hercules, MD  fenofibrate 54 MG tablet Take 1 tablet (54 mg total) by mouth daily. Patient taking differently: Take 54 mg by mouth every morning. 01/24/21 06/04/22  Narda Bonds, MD  furosemide (LASIX) 40 MG tablet Take 1 tablet (40 mg total) by mouth daily. 06/02/22 08/31/22  Rodolph Bong, MD  glimepiride (AMARYL) 4 MG tablet Take 2 tablets (8 mg total) by mouth every morning. 06/02/22 06/02/23  Rodolph Bong, MD  lisinopril (ZESTRIL) 40 MG tablet Take 1 tablet (40 mg total) by mouth daily. 06/02/22   Rodolph Bong, MD  Mupirocin [facility] EX) Apply 1 Application topically daily.    [provider]  Oxymetazoline HCl (NASAL SPRAY NA) Place 2 sprays into the nose 2 (two) times  daily as needed (asthma, runny nose).    [provider]  pantoprazole (PROTONIX) 40 MG tablet Take 1 tablet (40 mg total) by mouth daily at 6 (six) AM. 06/03/22   Rodolph Bong, MD  repaglinide (PRANDIN) 1 MG tablet Take 1 mg by mouth daily. 08/19/18   [provider]  rosuvastatin (CRESTOR) 20 MG tablet Take 20 mg by mouth every morning.    [provider]  Skin Protectants, Misc. (EUCERIN) cream Apply 1 application  topically daily as needed for dry skin.    [provider]      Allergies    Aspirin, Bee venom, Ibuprofen, Iodine, Metformin and related, Nsaids, Okra, Other, Shellfish  allergy, Actos [pioglitazone], Celecoxib, Latex, Morphine and codeine, and Ciprofloxacin    Review of Systems   Review of Systems A 10 point review of systems was performed and is negative unless otherwise reported in HPI.  Physical Exam Updated Vital Signs BP (!) 174/72 (BP Location: Right Arm)   Pulse 82   Temp 98.2 F (36.8 C) (Oral)   Resp (!) 21   SpO2 97%  Physical Exam General: Chronically ill-appearing obese female lying in bed.  Foul odor in the room. HEENT: PERRLA, EOMI, Sclera anicteric, MMM, trachea midline. NCAT. No midline C-spine TTP.  Cardiology: RRR, no murmurs/rubs/gallops. BL radial and DP pulses equal bilaterally. No chest wall TTP, no crepitus or signs of trauma. Skin breakdown under bilateral breasts.  Resp: Normal respiratory rate and effort. CTAB, no wheezes, rhonchi, crackles.  Abd: Soft, non-tender, non-distended. No rebound tenderness or guarding. Skin breakdown under large pannus.  GU: Deferred. MSK: Chronic lymphedema changes to BL LEs, L>R. L w/ pitting edema and erythema. Open ulcers to RLE without purulent drainage. Please see photos below. Good cap refill of BLEs which are warm, intact DP pulses bilaterally.  Back: Midline L spine TTP with no stepoffs/deformities. No signs of trauma. Skin: warm, dry.  Neuro: A&Ox4, CNs II-XII grossly intact. MAEs. Sensation grossly intact.              ED Results / Procedures / Treatments   Labs (all labs ordered are listed, but only abnormal results are displayed) Labs Reviewed  COMPREHENSIVE METABOLIC PANEL - Abnormal; Notable for the following components:      Result Value   Potassium 3.1 (*)    Chloride 97 (*)    Glucose, Bld 237 (*)    Albumin 3.3 (*)    Total Bilirubin 2.0 (*)    Anion gap 17 (*)    All other components within normal limits  CK - Abnormal; Notable for the following components:   Total CK 20 (*)    All other components within normal limits  D-DIMER, QUANTITATIVE - Abnormal;  Notable for the following components:   D-Dimer, Quant 0.95 (*)    All other components within normal limits  CBG MONITORING, ED - Abnormal; Notable for the following components:   Glucose-Capillary 204 (*)    All other components within normal limits  RESP PANEL BY RT-PCR (RSV, FLU A&B, COVID)  [id number]  CBC WITH DIFFERENTIAL/PLATELET  LACTIC ACID, PLASMA  BRAIN NATRIURETIC PEPTIDE  URINALYSIS, ROUTINE W REFLEX MICROSCOPIC  LACTIC ACID, PLASMA  TROPONIN I (HIGH SENSITIVITY)  TROPONIN I (HIGH SENSITIVITY)    EKG EKG Interpretation  Date/Time:  Wednesday September 24 2022 13:05:28 EDT Ventricular Rate:  88 PR Interval:  179 QRS Duration: 141 QT Interval:  432 QTC Calculation: 523 R Axis:   0 Text Interpretation:  Sinus rhythm Right bundle branch block Left ventricular hypertrophy Prolonged QT interval Similar to prior EKG Confirmed by Vivi Barrack [phone]) on 09/24/2022 1:26:49 PM  Radiology VAS [place] LOWER EXTREMITY VENOUS (DVT) (7a-7p)  Result Date: 09/24/2022  Lower Venous DVT Study Patient Name:  JERMESHA VOTA  Date of Exam:   09/24/2022 Medical Rec #: [id number]        Accession #:    [id number] Date of Birth: [birth year]         Patient Gender: F Patient Age:   26 years Exam Location:  [facility] Procedure:      VAS [place] LOWER EXTREMITY VENOUS (DVT) Referring Phys: Marnisha Stampley --------------------------------------------------------------------------------  Indications: Left lower extremity pain, swelling, erythema.  Comparison Study: Multiple prior studies- most recent prior study 06/06/21 was                   negative for DVT. Performing Technologist: Jean Rosenthal RDMS, RVT  Examination Guidelines: A complete evaluation includes B-mode imaging, spectral Doppler, color Doppler, and power Doppler as needed of all accessible portions of each vessel. Bilateral testing is considered an integral part of a complete examination. Limited examinations for reoccurring indications may be  performed as noted. The reflux portion of the exam is performed with the patient in reverse Trendelenburg.  +-----+---------------+---------+-----------+----------+--------------+ RIGHTCompressibilityPhasicitySpontaneityPropertiesThrombus Aging +-----+---------------+---------+-----------+----------+--------------+ CFV  Full           Yes      Yes                                 +-----+---------------+---------+-----------+----------+--------------+   +---------+---------------+---------+-----------+----------+--------------+ LEFT     CompressibilityPhasicitySpontaneityPropertiesThrombus Aging +---------+---------------+---------+-----------+----------+--------------+ CFV      Full           Yes      Yes                                 +---------+---------------+---------+-----------+----------+--------------+ SFJ      Full                                                        +---------+---------------+---------+-----------+----------+--------------+ FV Prox  Full                                                        +---------+---------------+---------+-----------+----------+--------------+ FV Mid   Full                                                        +---------+---------------+---------+-----------+----------+--------------+ FV DistalFull                                                        +---------+---------------+---------+-----------+----------+--------------+ PFV      Full                                                        +---------+---------------+---------+-----------+----------+--------------+  POP      Full           Yes      Yes                                 +---------+---------------+---------+-----------+----------+--------------+ PTV      Full                                                        +---------+---------------+---------+-----------+----------+--------------+ PERO     Full                                                         +---------+---------------+---------+-----------+----------+--------------+     Summary: RIGHT: - No evidence of common femoral vein obstruction.  LEFT: - There is no evidence of deep vein thrombosis in the lower extremity.  - No cystic structure found in the popliteal fossa.  *See table(s) above for measurements and observations.    Preliminary    CT Lumbar Spine Wo Contrast  Result Date: 09/24/2022 CLINICAL DATA:  Back trauma.  Fall with back pain. EXAM: CT LUMBAR SPINE WITHOUT CONTRAST TECHNIQUE: Multidetector CT imaging of the lumbar spine was performed without intravenous contrast administration. Multiplanar CT image reconstructions were also generated. RADIATION DOSE REDUCTION: This exam was performed according to the departmental dose-optimization program which includes automated exposure control, adjustment of the mA and/or kV according to patient size and/or use of iterative reconstruction technique. COMPARISON:  None Available. FINDINGS: Segmentation: Conventional numbering is assumed with 5 non-rib-bearing, lumbar type vertebral bodies. Alignment: Trace anterolisthesis of L4 on L5, likely degenerative. No traumatic malalignment. Vertebrae: Normal vertebral body heights. No acute fracture or suspicious bone lesion. 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 Paraspinal and other soft tissues: 4 mm nonobstructing stone in the upper pole left kidney. Aortic atherosclerosis. Fatty atrophy of the paraspinal muscles. Disc levels: T12-L1: Calcified left central/subarticular disc-osteophyte complex results in mild spinal canal stenosis and mild left neural foraminal narrowing. L1-L2: Disc bulge and facet arthropathy results in mild spinal canal stenosis. L2-L3: Partially calcified disc bulge and facet arthropathy results in moderate spinal canal stenosis and mild bilateral neural foraminal narrowing. L3-L4: Disc bulge and severe bilateral facet arthropathy  results in moderate-to-severe spinal canal stenosis, severe left and moderate right neural foraminal narrowing. L4-L5: Anterolisthesis, disc bulge and severe bilateral facet arthropathy results in severe spinal canal stenosis and moderate-to-severe right neural foraminal narrowing. L5-S1: Partially calcified central disc protrusion. No significant spinal canal stenosis or neural foraminal narrowing. IMPRESSION: 1. No acute fracture or traumatic malalignment. 2. 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 3. Moderate to severe neural foraminal narrowing, as detailed above. Aortic Atherosclerosis (ICD10-I70.0). Electronically Signed   By: Orvan Falconer M.D.   On: 09/24/2022 15:18   DG Chest Portable 1 View  Result Date: 09/24/2022 CLINICAL DATA:  fall, CP EXAM: PORTABLE CHEST 1 VIEW COMPARISON:  CXR 06/08/22 FINDINGS: No pleural effusion. No pneumothorax. Normal  cardiac and mediastinal contours. No focal airspace opacity. No radiographically apparent displaced rib fractures. Visualized upper abdomen is unremarkable. Degenerative changes of the bilateral AC joints. IMPRESSION: No radiographically apparent displaced rib fractures. If there is persistent clinical concern, consider further evaluation with a dedicated rib series or CT. Electronically Signed   By: Lorenza Cambridge M.D.   On: 09/24/2022 15:06    Procedures Procedures    Medications Ordered in ED Medications  carvedilol (COREG) tablet 6.25 mg (6.25 mg Oral Given 09/24/22 1334)  lisinopril (ZESTRIL) tablet 40 mg (40 mg Oral Given 09/24/22 1334)  labetalol (NORMODYNE) injection 5 mg (5 mg Intravenous Given 09/24/22 1506)  lactated ringers bolus 1,000 mL (0 mLs Intravenous Stopped 09/24/22 1629)    ED Course/ Medical Decision Making/ A&P                           Medical Decision Making Amount and/or Complexity of Data Reviewed Labs: ordered. Decision-making details documented in ED Course. Radiology: ordered.  Risk Prescription drug management.    This patient presents to the ED for concern of various complaints including unsatisfactory living conditions, this involves an extensive number of treatment options, and is a complaint that carries with it a high risk of complications and morbidity.  I considered the following differential and admission for this acute, potentially life threatening condition. Pt is hypertensive to >200 systolic.   MDM:    Consider HTNsive emergency, ACS/arrhythmia, rib fractures, muscle strain w/ her chest pain that started after fall. Will treat BP w/ home BP meds and labetalol, patient states she is compliant w/ BP meds but I question this. Also consider PNA, bronchitis, viral URI, anemia, electrolyte derangements, hypo/hyperglycemia, renal injury. She has BL LEE she states is due to chronic lymphedema and her L is always bigger than her R leg, however just today she noticed it is much larger and also redness on her foot/leg which wasn't previously there. Consider cellulitis or DVT, will get [place]. No crepitus or bullae to suggest nec fasc. For her fall today does report some back pain that is midline L spine, no FNDs but will obtain CT to r/o acute fracture/listhesis.   Patient with trash piled to ceiling, states that she is unable to take trash out because she walks with a walker. Water had been turned off weeks ago. Unable to care for herself at home. PD states she cannot go back to that house. Will need placement ultimately w/ TOC.   Clinical Course as of 09/24/22 1643  Wed Sep 24, 2022  1326 Glucose-Capillary(!): 204 Mildly elevated [HN]  1326 Given home BP meds including coreg and lisinopril [HN]  1430 Troponin I (High Sensitivity): 10 neg [HN]  1430 CBC with Differential wnl [HN]  1430 Potassium(!):  3.1 Mild hypokalemia, will replete [HN]  1619 B Natriuretic Peptide: 77.1 neg [HN]  1619 Troponin I (High Sensitivity): 15 Flat, neg [HN]  1619 CK Total(!): 20 unremarkable [HN]  1619 D-Dimer, Quant(!): 0.95 Getting CT PE [HN]  1630 Neg LLE DVT. Higher degree of suspicion for cellulitis at this time but no leukocytosis or fever, no c/f sepsis.  [HN]  1630 Total Bilirubin(!): 2.0 No RUQ pain to palpation, no abd pain. Elevated T bili is noted in her history. [HN]  1630 CT lumbar spine neg for acute fracture. CXR shows no rib fractures, PTX, or PNA. [HN]  1631 BP(!): 174/72 BP decreased after home BP meds and labetalol IV. [HN]  1631 Resp panel by RT-PCR (RSV, Flu A&B, Covid) Anterior Nasal Swab Neg [HN]  1631 Patient is signed out to the oncoming ED physician Dr. Wilkie Aye who is made aware of her history, presentation, exam, workup, and plan.  Plan is to obtain urine and CT PE.  Will give a dose of IV ceftriaxone while she is here.  If she is up for discharge, can get Keflex as an outpatient for cellulitis.  However patient is obviously unable to take care of herself at home.  Patient will need placement with TOC.  [HN]    Clinical Course User Index [HN] Loetta Rough, MD    Labs: I Ordered, and personally interpreted labs.  The pertinent results include:  those listed aobve  Imaging Studies ordered: I ordered imaging studies including CXR, LLE DVT US, CT Lspine. I independently visualized and interpreted imaging. I agree with the radiologist interpretation  Additional history obtained from chart review, EMS.   Cardiac Monitoring: The patient was maintained on a cardiac monitor.  I personally viewed and interpreted the cardiac monitored which showed an underlying rhythm of: NSR  Reevaluation: After the interventions noted above, I reevaluated the patient and found that they have :stayed the same  Social Determinants of Health: Patient lives independently in garbage-filled  home  Disposition:  Signed out  Co morbidities that complicate the patient evaluation  Past Medical History:  Diagnosis Date   Arthritis    "knees" (09/13/2015)   Asthma    Cellulitis 11/17/2018   LEFT LEG    Chronic diastolic CHF (congestive heart failure) (HCC)    a. 09/2015 Echo: EF 55-60%, mild LVH, gr2DD, no rwma, mild MR, mildly dil LA.    GERD (gastroesophageal reflux disease)    High cholesterol    Hypertension    Midsternal chest pain    a. 09/2015 MV: no ischemia/infarct, EF 61%; b. 09/2015 Low prob V:Q scan.   Morbid obesity (HCC)    Pneumonia ~ 2000   "mild case"   RBBB (right bundle branch block) approx 2010   Dr Manus Gunning did ECG and told her   Type II diabetes mellitus (HCC)      Medicines Meds ordered this encounter  Medications   lisinopril (ZESTRIL) tablet 40 mg   carvedilol (COREG) tablet 6.25 mg   labetalol (NORMODYNE) injection 5 mg   lactated ringers bolus 1,000 mL    I have reviewed the patients home medicines and have made adjustments as needed  Problem List / ED Course: Problem List Items Addressed This Visit       Other   Cellulitis of left lower extremity   Other Visit Diagnoses     Fall, initial encounter    -  Primary   Lymphedema       Unable to care for self       Unsatisfactory living conditions                       This note was created using dictation software, which may contain spelling or grammatical errors.    Loetta Rough, MD 09/24/22 [phone]

## 2022-09-24 NOTE — ED Triage Notes (Addendum)
Patient BIB EMS coming from home with c/o dizziness x4 day, unstable on feet, blurred vision, and hypertension.Fall today into trash pile today in house. No blood thinner.  Has not been able to get meds x1 month.B/P 240/120, CBG 260.

## 2022-09-25 ENCOUNTER — Encounter (HOSPITAL_COMMUNITY): Payer: Self-pay | Admitting: Internal Medicine

## 2022-09-25 ENCOUNTER — Other Ambulatory Visit: Payer: Self-pay

## 2022-09-25 ENCOUNTER — Inpatient Hospital Stay (HOSPITAL_COMMUNITY): Payer: Medicare HMO

## 2022-09-25 DIAGNOSIS — N3000 Acute cystitis without hematuria: Secondary | ICD-10-CM | POA: Diagnosis not present

## 2022-09-25 DIAGNOSIS — R531 Weakness: Secondary | ICD-10-CM | POA: Diagnosis not present

## 2022-09-25 DIAGNOSIS — E1165 Type 2 diabetes mellitus with hyperglycemia: Secondary | ICD-10-CM | POA: Diagnosis not present

## 2022-09-25 DIAGNOSIS — R079 Chest pain, unspecified: Secondary | ICD-10-CM | POA: Diagnosis not present

## 2022-09-25 DIAGNOSIS — E538 Deficiency of other specified B group vitamins: Secondary | ICD-10-CM | POA: Diagnosis present

## 2022-09-25 DIAGNOSIS — I5032 Chronic diastolic (congestive) heart failure: Secondary | ICD-10-CM | POA: Diagnosis not present

## 2022-09-25 DIAGNOSIS — R7989 Other specified abnormal findings of blood chemistry: Secondary | ICD-10-CM | POA: Diagnosis not present

## 2022-09-25 LAB — CBC WITH DIFFERENTIAL/PLATELET
Abs Immature Granulocytes: 0.02 10*3/uL (ref 0.00–0.07)
Basophils Absolute: 0 10*3/uL (ref 0.0–0.1)
Basophils Relative: 0 %
Eosinophils Absolute: 0 10*3/uL (ref 0.0–0.5)
Eosinophils Relative: 0 %
HCT: 40.7 % (ref 36.0–46.0)
Hemoglobin: 13.9 g/dL (ref 12.0–15.0)
Immature Granulocytes: 0 %
Lymphocytes Relative: 6 %
Lymphs Abs: 0.4 10*3/uL — ABNORMAL LOW (ref 0.7–4.0)
MCH: 28.4 pg (ref 26.0–34.0)
MCHC: 34.2 g/dL (ref 30.0–36.0)
MCV: 83.2 fL (ref 80.0–100.0)
Monocytes Absolute: 0.1 10*3/uL (ref 0.1–1.0)
Monocytes Relative: 1 %
Neutro Abs: 5.5 10*3/uL (ref 1.7–7.7)
Neutrophils Relative %: 93 %
Platelets: 184 10*3/uL (ref 150–400)
RBC: 4.89 MIL/uL (ref 3.87–5.11)
RDW: 14 % (ref 11.5–15.5)
WBC: 6 10*3/uL (ref 4.0–10.5)
nRBC: 0 % (ref 0.0–0.2)

## 2022-09-25 LAB — COMPREHENSIVE METABOLIC PANEL
ALT: 13 U/L (ref 0–44)
AST: 15 U/L (ref 15–41)
Albumin: 3 g/dL — ABNORMAL LOW (ref 3.5–5.0)
Alkaline Phosphatase: 69 U/L (ref 38–126)
Anion gap: 11 (ref 5–15)
BUN: 12 mg/dL (ref 8–23)
CO2: 23 mmol/L (ref 22–32)
Calcium: 8.5 mg/dL — ABNORMAL LOW (ref 8.9–10.3)
Chloride: 101 mmol/L (ref 98–111)
Creatinine, Ser: 0.73 mg/dL (ref 0.44–1.00)
GFR, Estimated: >60 mL/min (ref >60)
Glucose, Bld: 355 mg/dL — ABNORMAL HIGH (ref 70–99)
Potassium: 3.7 mmol/L (ref 3.5–5.1)
Sodium: 135 mmol/L (ref 135–145)
Total Bilirubin: 1.9 mg/dL — ABNORMAL HIGH (ref 0.3–1.2)
Total Protein: 7.2 g/dL (ref 6.5–8.1)

## 2022-09-25 LAB — GLUCOSE, CAPILLARY
Glucose-Capillary: 308 mg/dL — ABNORMAL HIGH (ref 70–99)
Glucose-Capillary: 311 mg/dL — ABNORMAL HIGH (ref 70–99)
Glucose-Capillary: 269 mg/dL — ABNORMAL HIGH (ref 70–99)
Glucose-Capillary: 268 mg/dL — ABNORMAL HIGH (ref 70–99)

## 2022-09-25 LAB — HEMOGLOBIN A1C
Hgb A1c MFr Bld: 9.1 % — ABNORMAL HIGH (ref 4.8–5.6)
Mean Plasma Glucose: 214.47 mg/dL

## 2022-09-25 LAB — TSH: TSH: 0.276 u[IU]/mL — ABNORMAL LOW (ref 0.350–4.500)

## 2022-09-25 LAB — VITAMIN B12: Vitamin B-12: 169 pg/mL — ABNORMAL LOW (ref 180–914)

## 2022-09-25 LAB — MRSA NEXT GEN BY PCR, NASAL: MRSA by PCR Next Gen: DETECTED — AB

## 2022-09-25 LAB — MAGNESIUM: Magnesium: 1.9 mg/dL (ref 1.7–2.4)

## 2022-09-25 MED ORDER — CHLORHEXIDINE GLUCONATE CLOTH 2 % EX PADS
6.0000 | MEDICATED_PAD | Freq: Every day | CUTANEOUS | Status: AC
  Administered 2022-09-25 – 2022-09-29 (×5): 6 via TOPICAL

## 2022-09-25 MED ORDER — CARVEDILOL 12.5 MG PO TABS
12.5000 mg | ORAL_TABLET | Freq: Two times a day (BID) | ORAL | Status: DC
  Administered 2022-09-26 – 2022-10-02 (×12): 12.5 mg via ORAL
  Filled 2022-09-25 (×12): qty 1

## 2022-09-25 MED ORDER — CARVEDILOL 6.25 MG PO TABS
6.2500 mg | ORAL_TABLET | Freq: Once | ORAL | Status: AC
  Administered 2022-09-25: 6.25 mg via ORAL
  Filled 2022-09-25: qty 1

## 2022-09-25 MED ORDER — HYDRALAZINE HCL 25 MG PO TABS
25.0000 mg | ORAL_TABLET | Freq: Three times a day (TID) | ORAL | Status: DC | PRN
  Administered 2022-09-25 – 2022-09-27 (×3): 25 mg via ORAL
  Filled 2022-09-25 (×4): qty 1

## 2022-09-25 MED ORDER — MUPIROCIN 2 % EX OINT
1.0000 | TOPICAL_OINTMENT | Freq: Two times a day (BID) | CUTANEOUS | Status: AC
  Administered 2022-09-25 – 2022-09-29 (×9): 1 via NASAL
  Filled 2022-09-25: qty 22

## 2022-09-25 MED ORDER — IOHEXOL 350 MG/ML SOLN
75.0000 mL | Freq: Once | INTRAVENOUS | Status: AC | PRN
  Administered 2022-09-25: 75 mL via INTRAVENOUS

## 2022-09-25 MED ORDER — INSULIN GLARGINE-YFGN 100 UNIT/ML ~~LOC~~ SOLN
12.0000 [IU] | Freq: Every day | SUBCUTANEOUS | Status: DC
  Administered 2022-09-25: 12 [IU] via SUBCUTANEOUS
  Filled 2022-09-25 (×2): qty 0.12

## 2022-09-25 MED ORDER — ORAL CARE MOUTH RINSE: 15.0000 mL | OROMUCOSAL | Status: DC | PRN

## 2022-09-25 MED ORDER — CYANOCOBALAMIN 1000 MCG/ML IJ SOLN
1000.0000 ug | Freq: Every day | INTRAMUSCULAR | Status: AC
  Administered 2022-09-25 – 2022-09-29 (×5): 1000 ug via INTRAMUSCULAR
  Filled 2022-09-25 (×5): qty 1

## 2022-09-25 NOTE — Progress Notes (Signed)
   09/25/22 1500  Spiritual Encounters  Type of Visit Initial  Care provided to: Patient  Referral source Chaplain assessment  Reason for visit Routine spiritual support  OnCall Visit No  Spiritual Framework  Presenting Themes Values and beliefs  Values/beliefs Faith  Patient Stress Factors  (loss of faith community)  Interventions  Spiritual Care Interventions Made Compassionate presence;Reflective listening;Normalization of emotions;Prayer  Intervention Outcomes  Outcomes Reduced isolation   Ch visited patient while doing routine round. There was no family at bedside. Pt is going through spiritual distress. Pt's faith is very important to her. She has learned that she will not be able to connect to her faith community when she goes to [facility]. The community only has a [employer] community which not compatible to her faith community. Ch provided compassionate presence and assisted pt with documenting values. No follow-up needed at this time.

## 2022-09-25 NOTE — TOC Initial Note (Signed)
Transition of Care [facility]) - Initial/Assessment Note    Patient Details  Name: Amanda Butler MRN: [id number] Date of Birth: 1955-10-07  Transition of Care [facility]) CM/SW Contact:    Kermit Balo, RN Phone Number: 09/25/2022, 2:44 PM  Clinical Narrative:                 Patient is from home alone. According to patient she feels that her home is going to be condemned. She has been falling at home. Doesn't have running water.  She is interested in attending SNF at [facility].  CSW to fax her out once therapies eval.  Pt doesn't have support at home.  TOC following.  Expected Discharge Plan: Skilled Nursing Facility Barriers to Discharge: Continued Medical Work up   Patient Goals and CMS Choice   CMS Medicare.gov Compare Post Acute Care list provided to:: Patient Choice offered to / list presented to : Patient      Expected Discharge Plan and Services In-house Referral: Clinical Social Work   Post Acute Care Choice: Skilled Nursing Facility Living arrangements for the past 2 months: Single Family Home                                      Prior Living Arrangements/Services Living arrangements for the past 2 months: Single Family Home Lives with:: Self Patient language and need for interpreter reviewed:: Yes Do you feel safe going back to the place where you live?: Yes      Need for Family Participation in Patient Care: Yes (Comment) Care giver support system in place?: No (comment) Current home services: DME (walker) Criminal Activity/Legal Involvement Pertinent to Current Situation/Hospitalization: No - Comment as needed  Activities of Daily Living Home Assistive Devices/Equipment: Walker (specify type), CBG Meter ADL Screening (condition at time of admission) Patient's cognitive ability adequate to safely complete daily activities?: Yes Is the patient deaf or have difficulty hearing?: No Does the patient have difficulty seeing, even when wearing  glasses/contacts?: Yes Does the patient have difficulty concentrating, remembering, or making decisions?: No Patient able to express need for assistance with ADLs?: Yes Does the patient have difficulty dressing or bathing?: No Independently performs ADLs?: Yes (appropriate for developmental age) Does the patient have difficulty walking or climbing stairs?: Yes Weakness of Legs: Both Weakness of Arms/Hands: None  Permission Sought/Granted                  Emotional Assessment Appearance:: Appears stated age Attitude/Demeanor/Rapport: Engaged Affect (typically observed): Accepting Orientation: : Oriented to Self, Oriented to Place, Oriented to  Time, Oriented to Situation   Psych Involvement: No (comment)  Admission diagnosis:  Lymphedema [I89.0] Cellulitis of left lower extremity [L03.116] Generalized weakness [R53.1] Unsatisfactory living conditions [Z59.19] Fall, initial encounter [W19.XXXA] Unable to care for self [Z78.9] Patient Active Problem List   Diagnosis Date Noted   Vitamin B12 deficiency 09/25/2022   Generalized weakness 09/24/2022   Acute cystitis 09/24/2022   Positive D dimer 09/24/2022   SOB (shortness of breath) 05/30/2022   Noncompliance 10/16/2021   UTI (urinary tract infection) 05/08/2021   Candidal skin infection 05/07/2021   Low TSH level 05/07/2021   Prolonged QT interval 05/07/2021   Postural dizziness with presyncope 05/07/2021   Cough    Urinary tract infection without hematuria    Venous stasis 05/06/2021   Sepsis (HCC) 05/06/2021   Dehydration 05/06/2021   Cellulitis of  left lower extremity 02/19/2021   Acute lower UTI 01/21/2021   Hypokalemia 01/21/2021   Hypertensive urgency 01/21/2021   Ulcers of both lower extremities, limited to breakdown of skin (HCC)    Cellulitis of left leg 11/16/2018   Accelerated hypertension 09/19/2018   Pressure injury of skin 02/20/2018   Cellulitis 10/02/2017   Chronic right-sided low back pain with  right-sided sciatica 06/22/2017   Closed nondisplaced fracture of greater tuberosity of left humerus 04/23/2017   DM2 (diabetes mellitus, type 2) (HCC)    Hyperlipidemia    Morbid obesity (HCC)    Chronic diastolic CHF (congestive heart failure) (HCC)    Midsternal chest pain    Pain in the chest    Essential hypertension    Morbid obesity due to excess calories (HCC)    Chest pain 09/13/2015   Bilateral lower extremity edema 09/13/2015   Diabetes mellitus type 2, diet-controlled (HCC) 09/13/2015   Obesity 09/13/2015   Total bilirubin, elevated 09/13/2015   HTN (hypertension) 09/13/2015   Stasis dermatitis of both legs 09/13/2015   Left leg cellulitis 09/13/2015   RBBB 09/13/2015   Asthma 09/13/2015   GERD (gastroesophageal reflux disease) 09/13/2015   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   PCP:  Blair Heys, MD Pharmacy:   [facility] [address] [place] [address] Phone: [phone] Fax: [phone]  [facility] [address] [place] [address] Phone: [phone] Fax: [phone]     Social Determinants of Health (SDOH) Social History: SDOH Screenings   Food Insecurity: No Food Insecurity (09/25/2022)  Housing: High Risk (09/25/2022)  Transportation Needs: No Transportation Needs (09/25/2022)  Utilities: At Risk (09/25/2022)  Depression (PHQ2-9): Low Risk  (02/25/2021)  Tobacco Use: Low Risk  (09/25/2022)   SDOH Interventions:     Readmission Risk Interventions     No data to display

## 2022-09-25 NOTE — Plan of Care (Signed)

## 2022-09-25 NOTE — [place] FL2 (Signed)
[place] MEDICAID FL2 LEVEL OF CARE FORM     IDENTIFICATION  Patient Name: Amanda Butler Birthdate: 06/20/1955 Sex: female Admission Date (Current Location): 09/24/2022  [facility] and [place] Number:  Producer, television/film/video and Address:  [facility], [address], [place], [place] [address]      Provider Number: [id number]  Attending Physician Name and Address:  Lurene Shadow, MD  Relative Name and Phone Number:       Current Level of Care: Hospital Recommended Level of Care: Skilled Nursing Facility Prior Approval Number:    Date Approved/Denied:   PASRR Number: [id number]  Discharge Plan: SNF    Current Diagnoses: Patient Active Problem List   Diagnosis Date Noted   Vitamin B12 deficiency 09/25/2022   Generalized weakness 09/24/2022   Acute cystitis 09/24/2022   Positive D dimer 09/24/2022   SOB (shortness of breath) 05/30/2022   Noncompliance 10/16/2021   UTI (urinary tract infection) 05/08/2021   Candidal skin infection 05/07/2021   Low TSH level 05/07/2021   Prolonged QT interval 05/07/2021   Postural dizziness with presyncope 05/07/2021   Cough    Urinary tract infection without hematuria    Venous stasis 05/06/2021   Sepsis (HCC) 05/06/2021   Dehydration 05/06/2021   Cellulitis of left lower extremity 02/19/2021   Acute lower UTI 01/21/2021   Hypokalemia 01/21/2021   Hypertensive urgency 01/21/2021   Ulcers of both lower extremities, limited to breakdown of skin (HCC)    Cellulitis of left leg 11/16/2018   Accelerated hypertension 09/19/2018   Pressure injury of skin 02/20/2018   Cellulitis 10/02/2017   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   Closed nondisplaced fracture of greater tuberosity of left humerus 04/23/2017   DM2 (diabetes mellitus, type 2) (HCC)    Hyperlipidemia    Morbid obesity (HCC)    Chronic diastolic CHF (congestive heart failure) (HCC)    Midsternal chest pain     Pain in the chest    Essential hypertension    Morbid obesity due to excess calories (HCC)    Chest pain 09/13/2015   Bilateral lower extremity edema 09/13/2015   Diabetes mellitus type 2, diet-controlled (HCC) 09/13/2015   Obesity 09/13/2015   Total bilirubin, elevated 09/13/2015   HTN (hypertension) 09/13/2015   Stasis dermatitis of both legs 09/13/2015   Left leg cellulitis 09/13/2015   RBBB 09/13/2015   Asthma 09/13/2015   GERD (gastroesophageal reflux disease) 09/13/2015   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    Orientation RESPIRATION BLADDER Height & Weight     Self, Time, Situation, Place  Normal Incontinent Weight: 211 lb 3.2 oz (95.8 kg) Height:  5\' 6"  (167.6 cm)  BEHAVIORAL SYMPTOMS/MOOD NEUROLOGICAL BOWEL NUTRITION STATUS      Incontinent Diet (carb modified)  AMBULATORY STATUS COMMUNICATION OF NEEDS Skin   Limited Assist Verbally Bruising, Skin abrasions, Other (Comment) (ulcer, pretibial right and left, impregnated gauze dressing: MASD abdomen and under breasts, silver dressings)                       Personal Care Assistance Level of Assistance  Bathing, Feeding, Dressing Bathing Assistance: Limited assistance Feeding assistance: Limited assistance Dressing Assistance: Limited assistance     Functional Limitations Info  Sight Sight Info: Impaired (glasses)        SPECIAL CARE FACTORS FREQUENCY  PT (By licensed PT), OT (By licensed OT)     PT Frequency: 5x/wk OT Frequency: 5x/wk  Contractures Contractures Info: Not present    Additional Factors Info  Code Status, Allergies, Insulin Sliding Scale Code Status Info: DNR Allergies Info: Aspirin, Bee Venom, Ibuprofen, Iodine, Metformin And Related, Nsaids, Okra, Other, Shellfish Allergy, Actos (Pioglitazone), Celecoxib, Latex, Morphine And Codeine, Ciprofloxacin   Insulin Sliding Scale Info: see DC summary       Current Medications (09/25/2022):  This is the current hospital active  medication list Current Facility-Administered Medications  Medication Dose Route Frequency Provider Last Rate Last Admin   acetaminophen (TYLENOL) tablet 650 mg  650 mg Oral Q6H PRN Howerter, Justin B, DO   650 mg at 09/25/22 0204   Or   acetaminophen (TYLENOL) suppository 650 mg  650 mg Rectal Q6H PRN Howerter, Justin B, DO       carvedilol (COREG) tablet 6.25 mg  6.25 mg Oral BID WC Howerter, Justin B, DO   6.25 mg at 09/25/22 0809   cefTRIAXone (ROCEPHIN) 1 g in sodium chloride 0.9 % 100 mL IVPB  1 g Intravenous Q24H Howerter, Justin B, DO 200 mL/hr at 09/25/22 1221 1 g at 09/25/22 1221   Chlorhexidine Gluconate Cloth 2 % PADS 6 each  6 each Topical Daily Howerter, Justin B, DO   6 each at 09/25/22 0813   cyanocobalamin (VITAMIN B12) injection 1,000 mcg  1,000 mcg Intramuscular Daily Lurene Shadow, MD   1,000 mcg at 09/25/22 1610   fenofibrate tablet 54 mg  54 mg Oral q morning Howerter, Justin B, DO   54 mg at 09/25/22 0810   furosemide (LASIX) tablet 40 mg  40 mg Oral Daily Howerter, Justin B, DO   40 mg at 09/25/22 0809   hydrALAZINE (APRESOLINE) tablet 25 mg  25 mg Oral Q8H PRN Lurene Shadow, MD   25 mg at 09/25/22 1354   insulin aspart (novoLOG) injection 0-9 Units  0-9 Units Subcutaneous TID WC Howerter, Justin B, DO   7 Units at 09/25/22 1222   insulin glargine-yfgn (SEMGLEE) injection 12 Units  12 Units Subcutaneous Daily Lurene Shadow, MD   12 Units at 09/25/22 1004   lisinopril (ZESTRIL) tablet 40 mg  40 mg Oral Daily Howerter, Justin B, DO   40 mg at 09/25/22 0809   melatonin tablet 3 mg  3 mg Oral QHS PRN Howerter, Justin B, DO       mupirocin ointment (BACTROBAN) 2 % 1 Application  1 Application Nasal BID Howerter, Justin B, DO   1 Application at 09/25/22 0809   Oral care mouth rinse  15 mL Mouth Rinse PRN Howerter, Justin B, DO       pantoprazole (PROTONIX) EC tablet 40 mg  40 mg Oral Q0600 Howerter, Justin B, DO   40 mg at 09/25/22 9604   rosuvastatin (CRESTOR) tablet 20 mg   20 mg Oral q morning Howerter, Justin B, DO   20 mg at 09/25/22 0809     Discharge Medications: Please see discharge summary for a list of discharge medications.  Relevant Imaging Results:  Relevant Lab Results:   Additional Information SS#: [id number]  Baldemar Lenis, LCSW

## 2022-09-25 NOTE — Progress Notes (Addendum)
Progress Note    Amanda Butler  ZOX:[id number] DOB: 12-Jan-1956  DOA: 09/24/2022 PCP: Blair Heys, MD      Brief Narrative:    Medical records reviewed and are as summarized below:  Amanda Butler is a 67 y.o. female with medical history significant for obesity, chronic diastolic CHF, hypertension, hyperlipidemia, type II DM, presented to the hospital with generalized weakness, dysuria and fall at home.  She said she has had general weakness and difficulty ambulating/unsteady gait for some time but symptoms became more pronounced about 3 to 4 days preceding admission.  She's also had some diarrhea and she is not sure whether this is from some of her medicines.  She said when she fell she could not get up from the floor and so she could not get up to open the door when EMS arrived.  She said EMS had to break down her door to get in.        Assessment/Plan:   Principal Problem:   Generalized weakness Active Problems:   GERD (gastroesophageal reflux disease)   Essential hypertension   DM2 (diabetes mellitus, type 2) (HCC)   Hyperlipidemia   Chronic diastolic CHF (congestive heart failure) (HCC)   Hypokalemia   Prolonged QT interval   Acute cystitis   Positive D dimer   Vitamin B12 deficiency    Body mass index is 34.09 kg/m.  (Obesity)   Acute UTI: Continue IV ceftriaxone.  Urine culture has been ordered.   Vitamin B12 deficiency: Vitamin B12 level was 169.  Start vitamin B12 injection daily.   General weakness, unsteady gait and fall at home: PT and OT recommend discharge to SNF.   Hypokalemia: Improved.  Continue potassium repletion   Prolonged QTc interval (522).  Repeat EKG tomorrow when potassium is replete   Type II DM with hyperglycemia: Start Semglee 12 units daily.  NovoLog as needed for hyperglycemia.  She takes glimepiride and Jardiance at home but this has been held.  Hemoglobin A1c was 9.1.   Hypertensive urgency: Continue carvedilol  and lisinopril.  Use oral hydralazine as needed for severe hypertension.   Chronic diastolic CHF: Compensated.  Continue Lasix   Chronic venous insufficiency/bilateral leg lymphedema/chronic venous ulcers on bilateral legs: Continue local wound care.     Diet Order             Diet Carb Modified Fluid consistency: Thin; Room service appropriate? Yes  Diet effective now                            Consultants: None  Procedures: None    Medications:    carvedilol  6.25 mg Oral BID WC   Chlorhexidine Gluconate Cloth  6 each Topical Daily   cyanocobalamin  1,000 mcg Intramuscular Daily   fenofibrate  54 mg Oral q morning   furosemide  40 mg Oral Daily   insulin aspart  0-9 Units Subcutaneous TID WC   insulin glargine-yfgn  12 Units Subcutaneous Daily   lisinopril  40 mg Oral Daily   mupirocin ointment  1 Application Nasal BID   pantoprazole  40 mg Oral Q0600   rosuvastatin  20 mg Oral q morning   Continuous Infusions:  cefTRIAXone (ROCEPHIN)  IV 1 g (09/25/22 1221)     Anti-infectives (From admission, onward)    Start     Dose/Rate Route Frequency Ordered Stop   09/25/22 1300  cefTRIAXone (ROCEPHIN) 1 g in  sodium chloride 0.9 % 100 mL IVPB        1 g 200 mL/hr over 30 Minutes Intravenous Every 24 hours 09/24/22 2316     09/24/22 1645  cefTRIAXone (ROCEPHIN) 1 g in sodium chloride 0.9 % 100 mL IVPB        1 g 200 mL/hr over 30 Minutes Intravenous  Once 09/24/22 1643 09/24/22 1805              Family Communication/Anticipated D/C date and plan/Code Status   DVT prophylaxis: SCDs Start: 09/24/22 2314     Code Status: DNR  Family Communication: None Disposition Plan: Will likely need SNF at discharge   Status is: Observation The patient will require care spanning > 2 midnights and should be moved to inpatient because: Falls at home, unsafe living conditions at home       Subjective:   Interval events noted.  She complains of  general weakness.  She had some dysuria but it is better.  Objective:    Vitals:   09/25/22 0032 09/25/22 0402 09/25/22 0807 09/25/22 1154  BP: (!) 176/74 (!) 163/71 (!) 158/58 (!) 174/61  Pulse: 63 61 61 (!) 55  Resp: 20 18 18 17   Temp: 98 F (36.7 C) 98.3 F (36.8 C) 98.3 F (36.8 C) 98 F (36.7 C)  TempSrc: Oral Oral Tympanic Oral  SpO2: 97% 98% 100% 100%  Weight: 95.8 kg 95.8 kg    Height: 5\' 6"  (1.676 m)      No data found.   Intake/Output Summary (Last 24 hours) at 09/25/2022 1502 Last data filed at 09/25/2022 1300 Gross per 24 hour  Intake 1437.32 ml  Output 200 ml  Net 1237.32 ml   Filed Weights   09/25/22 0032 09/25/22 0402  Weight: 95.8 kg 95.8 kg    Exam:  GEN: NAD SKIN: Warm and dry EYES: No pallor or icterus ENT: MMM CV: RRR PULM: CTA B ABD: soft, obese, NT, +BS CNS: AAO x 3, non focal EXT: Bilateral leg lymphedema.  Venous stasis ulcers on bilateral legs.          Data Reviewed:   I have personally reviewed following labs and imaging studies:  Labs: Labs show the following:   Basic Metabolic Panel: Recent Labs  Lab 09/24/22 1301 09/25/22 0336  NA 136 135  K 3.1* 3.7  CL 97* 101  CO2 22 23  GLUCOSE 237* 355*  BUN 12 12  CREATININE 0.79 0.73  CALCIUM 9.1 8.5*  MG  --  1.9   GFR Estimated Creatinine Clearance: 77.4 mL/min (by C-G formula based on SCr of 0.73 mg/dL). Liver Function Tests: Recent Labs  Lab 09/24/22 1301 09/25/22 0336  AST 15 15  ALT 13 13  ALKPHOS 70 69  BILITOT 2.0* 1.9*  PROT 7.4 7.2  ALBUMIN 3.3* 3.0*   No results for input(s): "LIPASE", "AMYLASE" in the last 168 hours. No results for input(s): "AMMONIA" in the last 168 hours. Coagulation profile No results for input(s): "INR", "PROTIME" in the last 168 hours.  CBC: Recent Labs  Lab 09/24/22 1301 09/25/22 0336  WBC 8.4 6.0  NEUTROABS 7.1 5.5  HGB 13.9 13.9  HCT 41.2 40.7  MCV 81.9 83.2  PLT 191 184   Cardiac Enzymes: Recent Labs  Lab  09/24/22 1501  CKTOTAL 20*   BNP (last 3 results) No results for input(s): "PROBNP" in the last 8760 hours. CBG: Recent Labs  Lab 09/24/22 1315 09/25/22 0618 09/25/22 1158  GLUCAP 204*  308* 311*   D-Dimer: Recent Labs    09/24/22 1445  DDIMER 0.95*   Hgb A1c: Recent Labs    09/25/22 0336  HGBA1C 9.1*   Lipid Profile: No results for input(s): "CHOL", "HDL", "LDLCALC", "TRIG", "CHOLHDL", "LDLDIRECT" in the last 72 hours. Thyroid function studies: Recent Labs    09/25/22 0336  TSH 0.276*   Anemia work up: Recent Labs    09/25/22 0336  VITAMINB12 169*   Sepsis Labs: Recent Labs  Lab 09/24/22 1301 09/24/22 1432 09/25/22 0336  WBC 8.4  --  6.0  LATICACIDVEN  --  1.3  --     Microbiology Recent Results (from the past 240 hour(s))  Resp panel by RT-PCR (RSV, Flu A&B, Covid) Anterior Nasal Swab     Status: None   Collection Time: 09/24/22  2:31 PM   Specimen: Anterior Nasal Swab  Result Value Ref Range Status   SARS Coronavirus 2 by RT PCR NEGATIVE NEGATIVE Final   Influenza A by PCR NEGATIVE NEGATIVE Final   Influenza B by PCR NEGATIVE NEGATIVE Final    Comment: (NOTE) The Xpert Xpress SARS-CoV-2/FLU/RSV plus assay is intended as an aid in the diagnosis of influenza from Nasopharyngeal swab specimens and should not be used as a sole basis for treatment. Nasal washings and aspirates are unacceptable for Xpert Xpress SARS-CoV-2/FLU/RSV testing.  Fact Sheet for Patients: BloggerCourse.com  Fact Sheet for Healthcare Providers: SeriousBroker.it  This test is not yet approved or cleared by the [place] FDA and has been authorized for detection and/or diagnosis of SARS-CoV-2 by FDA under an Emergency Use Authorization (EUA). This EUA will remain in effect (meaning this test can be used) for the duration of the COVID-19 declaration under Section 564(b)(1) of the Act, 21 U.S.C. section 360bbb-3(b)(1),  unless the authorization is terminated or revoked.     Resp Syncytial Virus by PCR NEGATIVE NEGATIVE Final    Comment: (NOTE) Fact Sheet for Patients: BloggerCourse.com  Fact Sheet for Healthcare Providers: SeriousBroker.it  This test is not yet approved or cleared by the [place] FDA and has been authorized for detection and/or diagnosis of SARS-CoV-2 by FDA under an Emergency Use Authorization (EUA). This EUA will remain in effect (meaning this test can be used) for the duration of the COVID-19 declaration under Section 564(b)(1) of the Act, 21 U.S.C. section 360bbb-3(b)(1), unless the authorization is terminated or revoked.  Performed at [facility], [address], [place], [place] [address]   MRSA Next Gen by PCR, Nasal     Status: Abnormal   Collection Time: 09/25/22  2:07 AM   Specimen: Nasal Mucosa; Nasal Swab  Result Value Ref Range Status   MRSA by PCR Next Gen DETECTED (A) NOT DETECTED Final    Comment: RESULTS CALLED TO, READ BACK BY AND VERIFIED WITH RN A.WRIGHT ON 09/25/22 AT 0339 BY NM (NOTE) The GeneXpert MRSA Assay (FDA approved for NASAL specimens only), is one component of a comprehensive MRSA colonization surveillance program. It is not intended to diagnose MRSA infection nor to guide or monitor treatment for MRSA infections. Test performance is not FDA approved in patients less than 33 years old. Performed at [facility], [address], [place], [place] [address]     Procedures and diagnostic studies:  CT Angio Chest PE W and/or Wo Contrast  Result Date: 09/25/2022 CLINICAL DATA:  Chest pain and positive D-dimer, history of recent fall, initial encounter EXAM: CT ANGIOGRAPHY CHEST WITH CONTRAST TECHNIQUE: Multidetector CT  imaging of the chest was performed using the standard protocol during bolus administration of intravenous contrast. Multiplanar CT image reconstructions and MIPs were  obtained to evaluate the vascular anatomy. RADIATION DOSE REDUCTION: This exam was performed according to the departmental dose-optimization program which includes automated exposure control, adjustment of the mA and/or kV according to patient size and/or use of iterative reconstruction technique. CONTRAST:  75mL OMNIPAQUE IOHEXOL 350 MG/ML SOLN COMPARISON:  Plain film from earlier in the same day. FINDINGS: Cardiovascular: Thoracic aorta shows normal enhancement pattern. No dissection or aneurysmal dilatation is seen. Heart is mildly enlarged in size. The pulmonary artery shows a normal branching pattern bilaterally. No filling defect to suggest pulmonary embolism is noted. Minimal coronary calcifications are noted. Mediastinum/Nodes: Thoracic inlet is within normal limits. No hilar or mediastinal adenopathy is noted. The esophagus as visualized is within normal limits. Lungs/Pleura: Lungs are well aerated bilaterally. No focal infiltrate or effusion is seen. Upper Abdomen: Visualized upper abdomen shows no acute abnormality. Musculoskeletal: Degenerative changes of the thoracic spine are noted. No acute rib abnormality is seen. Review of the MIP images confirms the above findings. IMPRESSION: No evidence of pulmonary emboli. No acute abnormality seen. Electronically Signed   By: Alcide Clever M.D.   On: 09/25/2022 01:42   VAS [place] LOWER EXTREMITY VENOUS (DVT) (7a-7p)  Result Date: 09/24/2022  Lower Venous DVT Study Patient Name:  FIONNUALA SHAHBAZ  Date of Exam:   09/24/2022 Medical Rec #: [id number]        Accession #:    [id number] Date of Birth: March 02, 1956         Patient Gender: F Patient Age:   89 years Exam Location:  [facility] Procedure:      VAS [place] LOWER EXTREMITY VENOUS (DVT) Referring Phys: HAYLEY NAASZ --------------------------------------------------------------------------------  Indications: Left lower extremity pain, swelling, erythema.  Comparison Study: Multiple prior studies- most recent  prior study 05-07-2021 was                   negative for DVT. Performing Technologist: Jean Rosenthal RDMS, RVT  Examination Guidelines: A complete evaluation includes B-mode imaging, spectral Doppler, color Doppler, and power Doppler as needed of all accessible portions of each vessel. Bilateral testing is considered an integral part of a complete examination. Limited examinations for reoccurring indications may be performed as noted. The reflux portion of the exam is performed with the patient in reverse Trendelenburg.  +-----+---------------+---------+-----------+----------+--------------+ RIGHTCompressibilityPhasicitySpontaneityPropertiesThrombus Aging +-----+---------------+---------+-----------+----------+--------------+ CFV  Full           Yes      Yes                                 +-----+---------------+---------+-----------+----------+--------------+   +---------+---------------+---------+-----------+----------+--------------+ LEFT     CompressibilityPhasicitySpontaneityPropertiesThrombus Aging +---------+---------------+---------+-----------+----------+--------------+ CFV      Full           Yes      Yes                                 +---------+---------------+---------+-----------+----------+--------------+ SFJ      Full                                                        +---------+---------------+---------+-----------+----------+--------------+  FV Prox  Full                                                        +---------+---------------+---------+-----------+----------+--------------+ FV Mid   Full                                                        +---------+---------------+---------+-----------+----------+--------------+ FV DistalFull                                                        +---------+---------------+---------+-----------+----------+--------------+ PFV      Full                                                         +---------+---------------+---------+-----------+----------+--------------+ POP      Full           Yes      Yes                                 +---------+---------------+---------+-----------+----------+--------------+ PTV      Full                                                        +---------+---------------+---------+-----------+----------+--------------+ PERO     Full                                                        +---------+---------------+---------+-----------+----------+--------------+     Summary: RIGHT: - No evidence of common femoral vein obstruction.  LEFT: - There is no evidence of deep vein thrombosis in the lower extremity.  - No cystic structure found in the popliteal fossa.  *See table(s) above for measurements and observations. Electronically signed by Heath Lark on 09/24/2022 at 5:00:23 PM.    Final    CT Lumbar Spine Wo Contrast  Result Date: 09/24/2022 CLINICAL DATA:  Back trauma.  Fall with back pain. EXAM: CT LUMBAR SPINE WITHOUT CONTRAST TECHNIQUE: Multidetector CT imaging of the lumbar spine was performed without intravenous contrast administration. Multiplanar CT image reconstructions were also generated. RADIATION DOSE REDUCTION: This exam was performed according to the departmental dose-optimization program which includes automated exposure control, adjustment of the mA and/or kV according to patient size and/or use of iterative reconstruction technique. COMPARISON:  None Available. FINDINGS: Segmentation: Conventional numbering is assumed with 5 non-rib-bearing, lumbar type vertebral bodies. Alignment: Trace anterolisthesis of L4 on L5, likely degenerative. No traumatic malalignment. Vertebrae: Normal  vertebral body heights. No acute fracture or suspicious bone lesion. 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 Paraspinal and other soft tissues: 4 mm nonobstructing stone in the upper pole left kidney. Aortic atherosclerosis. Fatty  atrophy of the paraspinal muscles. Disc levels: T12-L1: Calcified left central/subarticular disc-osteophyte complex results in mild spinal canal stenosis and mild left neural foraminal narrowing. L1-L2: Disc bulge and facet arthropathy results in mild spinal canal stenosis. L2-L3: Partially calcified disc bulge and facet arthropathy results in moderate spinal canal stenosis and mild bilateral neural foraminal narrowing. L3-L4: Disc bulge and severe bilateral facet arthropathy results in moderate-to-severe spinal canal stenosis, severe left and moderate right neural foraminal narrowing. L4-L5: Anterolisthesis, disc bulge and severe bilateral facet arthropathy results in severe spinal canal stenosis and moderate-to-severe right neural foraminal narrowing. L5-S1: Partially calcified central disc protrusion. No significant spinal canal stenosis or neural foraminal narrowing. IMPRESSION: 1. No acute fracture or traumatic malalignment. 2. 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 3. Moderate to severe neural foraminal narrowing, as detailed above. Aortic Atherosclerosis (ICD10-I70.0). Electronically Signed   By: Orvan Falconer M.D.   On: 09/24/2022 15:18   DG Chest Portable 1 View  Result Date: 09/24/2022 CLINICAL DATA:  fall, CP EXAM: PORTABLE CHEST 1 VIEW COMPARISON:  CXR 06/08/22 FINDINGS: No pleural effusion. No pneumothorax. Normal cardiac and mediastinal contours. No focal airspace opacity. No radiographically apparent displaced rib fractures. Visualized upper abdomen is unremarkable. Degenerative changes of the bilateral AC joints. IMPRESSION: No radiographically apparent displaced rib fractures. If there is persistent clinical concern, consider further evaluation with a dedicated rib series or CT. Electronically  Signed   By: Lorenza Cambridge M.D.   On: 09/24/2022 15:06               LOS: 1 day   Jamond Neels  [facility]   Pager on [email or web address]. If 7PM-7AM, please contact night-coverage at [email or web address]     09/25/2022, 3:02 PM

## 2022-09-25 NOTE — Evaluation (Signed)
Physical Therapy Evaluation Patient Details Name: Amanda Butler MRN: [id number] DOB: [birth year] Today's Date: 09/25/2022  History of Present Illness  Pt is a 67 y/o F admitted on 09/24/22 after presenting with c/o generalized weakness x 3-4 days. PMH: chronic diastolic heart failure, essential pretension, HLD, DM2, morbid obesity, RBBB  Clinical Impression  Pt seen for PT evaluation with pt agreeable to tx. Pt hyperverbose throughout session, follows one step commands. Pt is able to complete STS from recliner, toilet with supervision. Pt ambulates with RW & CGA with cuing for proper positioning within center of AD. Pt would benefit from skilled PT treatment for strengthening & endurance training to reduce fall risk.       Recommendations for follow up therapy are one component of a multi-disciplinary discharge planning process, led by the attending physician.  Recommendations may be updated based on patient status, additional functional criteria and insurance authorization.  Follow Up Recommendations Can patient physically be transported by private vehicle: Yes     Assistance Recommended at Discharge Set up Supervision/Assistance  Patient can return home with the following  A little help with walking and/or transfers;A little help with bathing/dressing/bathroom;Assistance with cooking/housework;Help with stairs or ramp for entrance;Assist for transportation    Equipment Recommendations None recommended by PT  Recommendations for Other Services       Functional Status Assessment Patient has had a recent decline in their functional status and demonstrates the ability to make significant improvements in function in a reasonable and predictable amount of time.     Precautions / Restrictions Precautions Precautions: Fall Restrictions Weight Bearing Restrictions: No      Mobility  Bed Mobility               General bed mobility comments: not tested, pt received & left sitting in  recliner    Transfers Overall transfer level: Needs assistance Equipment used: Rolling walker (2 wheels) Transfers: Sit to/from Stand Sit to Stand: Supervision           General transfer comment: STS from recliner, toilet with grab bar    Ambulation/Gait Ambulation/Gait assistance: Supervision, Min guard Gait Distance (Feet): 100 Feet Assistive device: Rolling walker (2 wheels) Gait Pattern/deviations: Decreased step length - right, Decreased step length - left, Decreased stride length, Decreased dorsiflexion - right, Decreased dorsiflexion - left Gait velocity: decreased     General Gait Details: Educated pt on need to ambulate within base of AD but decreased return demo.  Stairs            Wheelchair Mobility    Modified Rankin (Stroke Patients Only)       Balance Overall balance assessment: Needs assistance, History of Falls Sitting-balance support: No upper extremity supported, Feet supported Sitting balance-Leahy Scale: Good     Standing balance support: Bilateral upper extremity supported, No upper extremity supported, During functional activity Standing balance-Leahy Scale: Fair                               Pertinent Vitals/Pain Pain Assessment Pain Assessment: Faces Faces Pain Scale: Hurts a little bit Pain Location: chronic BLE pain Pain Descriptors / Indicators: Discomfort Pain Intervention(s): Monitored during session    Home Living Family/patient expects to be discharged to:: Unsure (Pt's home unliveable, pt hoping to d/c to SNF [facility])) Living Arrangements: Alone                 Additional Comments: pt lives in  a home without running water for 30 days, floor and wall separating from each other and trash piled in home    Prior Function Prior Level of Function : Needs assist             Mobility Comments: Pt reports she's ambulatory with RW, notes 3 falls in the past 6 months 2/2 impaired home structure, pt  notes the issue is she can no longer get herself up after she falls. ADLs Comments: Reports wearing depends as toilet nonfunctional and clogged with BM, using "scrubbies" and wet wipes to bathe. Reports forgetting to take medicines, difficulty getting groceries (ordering online now) and poor mgmt of laundry/dishes with current home setup     Hand Dominance   Dominant Hand: Right    Extremity/Trunk Assessment   Upper Extremity Assessment Upper Extremity Assessment: Generalized weakness    Lower Extremity Assessment Lower Extremity Assessment: Generalized weakness    Cervical / Trunk Assessment Cervical / Trunk Assessment: Normal  Communication   Communication: No difficulties;Other (comment) (hyperverbose)  Cognition Arousal/Alertness: Awake/alert Behavior During Therapy: Impulsive Overall Cognitive Status: No family/caregiver present to determine baseline cognitive functioning                                 General Comments: Pt hyperverbose throughout session, follows simple commands, decreased awareness of hygiene as pt electing not to perform perihygiene after voiding, does wash hands.        General Comments General comments (skin integrity, edema, etc.): Per staff, pt's home likelty will be condemned. Pt with continent void on toilet.    Exercises     Assessment/Plan    PT Assessment Patient needs continued PT services  PT Problem List Decreased strength;Decreased activity tolerance;Decreased balance;Decreased mobility;Decreased safety awareness;Decreased knowledge of use of DME       PT Treatment Interventions DME instruction;Therapeutic exercise;Balance training;Gait training;Stair training;Functional mobility training;Therapeutic activities;Patient/family education;Neuromuscular re-education;Modalities    PT Goals (Current goals can be found in the Care Plan section)  Acute Rehab PT Goals Patient Stated Goal: to go rehab PT Goal Formulation: With  patient Time For Goal Achievement: 10/09/22 Potential to Achieve Goals: Good    Frequency Min 3X/week     Co-evaluation               AM-PAC PT "6 Clicks" Mobility  Outcome Measure Help needed turning from your back to your side while in a flat bed without using bedrails?: None Help needed moving from lying on your back to sitting on the side of a flat bed without using bedrails?: A Little Help needed moving to and from a bed to a chair (including a wheelchair)?: A Little Help needed standing up from a chair using your arms (e.g., wheelchair or bedside chair)?: A Little Help needed to walk in hospital room?: A Little Help needed climbing 3-5 steps with a railing? : A Little 6 Click Score: 19    End of Session   Activity Tolerance: Patient tolerated treatment well Patient left: in chair;with call bell/phone within reach   PT Visit Diagnosis: History of falling (Z91.81);Muscle weakness (generalized) (M62.81)    Time: 1610-9604 PT Time Calculation (min) (ACUTE ONLY): 12 min   Charges:   PT Evaluation $PT Eval Low Complexity: 1 Low          Aleda Grana, PT, DPT 09/25/22, 3:20 PM   Sandi Mariscal 09/25/2022, 3:19 PM

## 2022-09-25 NOTE — Consult Note (Signed)
WOC Nurse Consult Note: Reason for Consult: RLE, pannus/breast, toes Patient is well known to [facility] nursing team from previous admissions. History HTN, CVA, DM. Living in unkept housing with reported trash, animals, ticks/fleas. History of lymphedema and venous stasis disease. In the past she has reported she is not able to don compression garments independently   Wound type: Venous stasis ulceration in the presence of lymphedema; RLE blistering that has unroofed. All areas are 100% clean, pink. Evidence of re-epithelization surrounding current ulcerations.  Poor hygiene, RLE webspace, fungal nails  Irritant Contact Dermatitis with fungal overgrowth, under pannus and inframammary; redness  Pressure Injury POA: NA Measurement: see nursing flow sheet Wound bed:see above  Drainage (amount, consistency, odor) serosanguinous LE, moderate Periwound: edema LLE >RLE, which is chronic for her, however she has redness of the RLE. Negative for DVT.  Dressing procedure/placement/frequency: Cleanse RLE wounds with saline, pat dry  Top with single layer of xeroform gauze over open areas  Cover with silicone foam or wrap with gauze. Top with 4" ACE wraps from toes to knee  Change dailly   Cleanse between toes with soap and water, if webspaces are draining, weave a strip of Aquacel Ag+(silver hydrofiber) Lawson # [id number] between toes, change every other day.   Antimicrobial wicking fabric under the breast and pannus, use of 7 days. See nursing care orders.   Discussed POC with bedside nurse via [facility]  Re consult if needed, will not follow at this time. Thanks  Amyiah Gaba [employer], RN,CWOCN, CNS, CWON-AP [phone])

## 2022-09-25 NOTE — Evaluation (Signed)
Occupational Therapy Evaluation Patient Details Name: Amanda Butler MRN: [id number] DOB: 03/05/1956 Today's Date: 09/25/2022   History of Present Illness Pt is a 67 y/o female presented after a fall at home. Admitted for UTI, suspected acute cystitis and hypokalemia. PMH: cellulitis, GERD, HTN, obesity, DM   Clinical Impression   PTA, pt lives alone in unsafe housing environment and reports increasing difficulty managing ADLs/IADLs. Pt reports typically using RW for mobility. Pt presents now with deficits in endurance, dynamic standing balance, strength and cognition. Pt impulsive with movements and hyperverbose throughout session. Overall, pt requires min guard for bathroom mobility using RW and LB ADLs. Pt interested in post acute rehab stay to maximize independence with ADLs/mobility and requesting consideration of [name] as her daughter is currently living there.      Recommendations for follow up therapy are one component of a multi-disciplinary discharge planning process, led by the attending physician.  Recommendations may be updated based on patient status, additional functional criteria and insurance authorization.   Assistance Recommended at Discharge Frequent or constant Supervision/Assistance  Patient can return home with the following A little help with bathing/dressing/bathroom;Assistance with cooking/housework;Direct supervision/assist for medications management;Direct supervision/assist for financial management;Assist for transportation;Help with stairs or ramp for entrance    Functional Status Assessment  Patient has had a recent decline in their functional status and demonstrates the ability to make significant improvements in function in a reasonable and predictable amount of time.  Equipment Recommendations  None recommended by OT    Recommendations for Other Services       Precautions / Restrictions Precautions Precautions: Fall Restrictions Weight Bearing  Restrictions: No      Mobility Bed Mobility               General bed mobility comments: in chair on entry    Transfers Overall transfer level: Needs assistance Equipment used: Rolling walker (2 wheels) Transfers: Sit to/from Stand Sit to Stand: Min guard                  Balance Overall balance assessment: Needs assistance, History of Falls Sitting-balance support: No upper extremity supported, Feet supported Sitting balance-Leahy Scale: Good     Standing balance support: Bilateral upper extremity supported, No upper extremity supported, During functional activity Standing balance-Leahy Scale: Fair                             ADL either performed or assessed with clinical judgement   ADL Overall ADL's : Needs assistance/impaired Eating/Feeding: Independent   Grooming: Modified independent;Standing;Wash/dry hands   Upper Body Bathing: Supervision/ safety;Sitting   Lower Body Bathing: Sit to/from stand;Min guard   Upper Body Dressing : Supervision/safety   Lower Body Dressing: Min guard Lower Body Dressing Details (indicate cue type and reason): able to don socks with increased time Toilet Transfer: Ambulation;Rolling walker (2 wheels);Min guard   Toileting- Clothing Manipulation and Hygiene: Supervision/safety;Sitting/lateral lean;Sit to/from stand       Functional mobility during ADLs: Min guard;Rolling walker (2 wheels) General ADL Comments: Pt impulsive with movements, hx of falls and has decreased ability to safely care for self living at home alone     Vision Baseline Vision/History: 1 Wears glasses Ability to See in Adequate Light: 0 Adequate Patient Visual Report: No change from baseline Vision Assessment?: No apparent visual deficits     Perception     Praxis      Pertinent Vitals/Pain Pain Assessment  Pain Assessment: No/denies pain     Hand Dominance Right   Extremity/Trunk Assessment Upper Extremity  Assessment Upper Extremity Assessment: Generalized weakness   Lower Extremity Assessment Lower Extremity Assessment: Defer to PT evaluation   Cervical / Trunk Assessment Cervical / Trunk Assessment: Normal   Communication Communication Communication: No difficulties;Other (comment) (hyperverbose)   Cognition Arousal/Alertness: Awake/alert Behavior During Therapy: Impulsive Overall Cognitive Status: No family/caregiver present to determine baseline cognitive functioning                                 General Comments: difficult to fully assess cognition as pt hyperverbose, talking throughout session and often talking over therapist. follows one step commands, able to sequence ADLs appropriately. reports memory deficits since stroke and unable to remember to take meds`     General Comments  Per nursing staff and CM, pt's home likely will be condemned    Exercises     Shoulder Instructions      Home Living Family/patient expects to be discharged to:: Unsure Living Arrangements: Alone                               Additional Comments: pt lives in a home without running water for 30 days, floor and wall separating from eacher other and trash piled in home      Prior Functioning/Environment Prior Level of Function : Needs assist             Mobility Comments: RW in the home, fall leading to this admission ADLs Comments: Reports wearing depends as toilet nonfunctional and clogged with BM, using "scrubbies" and wet wipes to bathe. Reports forgetting to take medicines, difficulty getting groceries (ordering online now) and poor mgmt of laundry/dishes with current home setup        OT Problem List: Decreased strength;Decreased activity tolerance;Impaired balance (sitting and/or standing);Decreased cognition;Decreased safety awareness      OT Treatment/Interventions: Self-care/ADL training;Therapeutic exercise;Energy conservation;DME and/or AE  instruction;Therapeutic activities;Patient/family education    OT Goals(Current goals can be found in the care plan section) Acute Rehab OT Goals Patient Stated Goal: go to rehab facility where daughter is OT Goal Formulation: With patient Time For Goal Achievement: 10/09/22 Potential to Achieve Goals: Fair ADL Goals Pt Will Perform Lower Body Dressing: with modified independence;sit to/from stand Pt Will Transfer to Toilet: with modified independence;ambulating Additional ADL Goal #1: Pt to sustain attention to ADL/functional tasks 3 min with no more than min verbal cues Additional ADL Goal #2: Pt to complete pill box test with 0 errors Additional ADL Goal #3: Pt to increase standing activity tolerance > 10 min during ADLs/mobility without seated rest break  OT Frequency: Min 2X/week    Co-evaluation              AM-PAC OT "6 Clicks" Daily Activity     Outcome Measure Help from another person eating meals?: None Help from another person taking care of personal grooming?: A Little Help from another person toileting, which includes using toliet, bedpan, or urinal?: A Little Help from another person bathing (including washing, rinsing, drying)?: A Little Help from another person to put on and taking off regular upper body clothing?: A Little Help from another person to put on and taking off regular lower body clothing?: A Little 6 Click Score: 19   End of Session Equipment Utilized During Treatment: Rolling  walker (2 wheels);Gait belt Nurse Communication: Mobility status  Activity Tolerance: Patient tolerated treatment well Patient left: in chair;with call bell/phone within reach  OT Visit Diagnosis: Unsteadiness on feet (R26.81);Muscle weakness (generalized) (M62.81);History of falling (Z91.81)                Time: 1610-9604 OT Time Calculation (min): 29 min Charges:  OT General Charges $OT Visit: 1 Visit OT Evaluation $OT Eval Moderate Complexity: 1 Mod OT  Treatments $Self Care/Home Management : 8-22 mins  Bradd Canary, OTR/L Acute Rehab Services Office: [phone]   Lorre Munroe 09/25/2022, 2:33 PM

## 2022-09-25 NOTE — Care Management Obs Status (Signed)
MEDICARE OBSERVATION STATUS NOTIFICATION   Patient Details  Name: Amanda Butler MRN: [id number] Date of Birth: 11/22/1955   Medicare Observation Status Notification Given:  Yes    Kermit Balo, RN 09/25/2022, 1:35 PM

## 2022-09-25 NOTE — Care Management CC44 (Signed)
Condition Code 44 Documentation Completed  Patient Details  Name: Amanda Butler MRN: [id number] Date of Birth: [birth year]   Condition Code 44 given:  Yes Patient signature on Condition Code 44 notice:  Yes Documentation of 2 MD's agreement:  Yes Code 44 added to claim:  Yes    Kermit Balo, RN 09/25/2022, 1:35 PM

## 2022-09-26 DIAGNOSIS — B369 Superficial mycosis, unspecified: Secondary | ICD-10-CM | POA: Diagnosis not present

## 2022-09-26 DIAGNOSIS — I83009 Varicose veins of unspecified lower extremity with ulcer of unspecified site: Secondary | ICD-10-CM | POA: Diagnosis not present

## 2022-09-26 DIAGNOSIS — I110 Hypertensive heart disease with heart failure: Secondary | ICD-10-CM | POA: Diagnosis not present

## 2022-09-26 DIAGNOSIS — Z66 Do not resuscitate: Secondary | ICD-10-CM | POA: Diagnosis not present

## 2022-09-26 DIAGNOSIS — N390 Urinary tract infection, site not specified: Principal | ICD-10-CM

## 2022-09-26 DIAGNOSIS — Z1152 Encounter for screening for COVID-19: Secondary | ICD-10-CM | POA: Diagnosis not present

## 2022-09-26 DIAGNOSIS — L03116 Cellulitis of left lower limb: Secondary | ICD-10-CM | POA: Diagnosis not present

## 2022-09-26 DIAGNOSIS — L97919 Non-pressure chronic ulcer of unspecified part of right lower leg with unspecified severity: Secondary | ICD-10-CM | POA: Diagnosis not present

## 2022-09-26 DIAGNOSIS — E1165 Type 2 diabetes mellitus with hyperglycemia: Secondary | ICD-10-CM | POA: Diagnosis not present

## 2022-09-26 DIAGNOSIS — W19XXXA Unspecified fall, initial encounter: Secondary | ICD-10-CM | POA: Diagnosis not present

## 2022-09-26 DIAGNOSIS — I5032 Chronic diastolic (congestive) heart failure: Secondary | ICD-10-CM | POA: Diagnosis not present

## 2022-09-26 LAB — BASIC METABOLIC PANEL
Anion gap: 10 (ref 5–15)
BUN: 16 mg/dL (ref 8–23)
CO2: 24 mmol/L (ref 22–32)
Calcium: 8.3 mg/dL — ABNORMAL LOW (ref 8.9–10.3)
Chloride: 102 mmol/L (ref 98–111)
Creatinine, Ser: 0.96 mg/dL (ref 0.44–1.00)
GFR, Estimated: >60 mL/min (ref >60)
Glucose, Bld: 286 mg/dL — ABNORMAL HIGH (ref 70–99)
Potassium: 3.3 mmol/L — ABNORMAL LOW (ref 3.5–5.1)
Sodium: 136 mmol/L (ref 135–145)

## 2022-09-26 LAB — GLUCOSE, CAPILLARY
Glucose-Capillary: 304 mg/dL — ABNORMAL HIGH (ref 70–99)
Glucose-Capillary: 279 mg/dL — ABNORMAL HIGH (ref 70–99)
Glucose-Capillary: 270 mg/dL — ABNORMAL HIGH (ref 70–99)
Glucose-Capillary: 285 mg/dL — ABNORMAL HIGH (ref 70–99)
Glucose-Capillary: 288 mg/dL — ABNORMAL HIGH (ref 70–99)
Glucose-Capillary: 246 mg/dL — ABNORMAL HIGH (ref 70–99)

## 2022-09-26 LAB — MAGNESIUM: Magnesium: 1.9 mg/dL (ref 1.7–2.4)

## 2022-09-26 LAB — CULTURE, OB URINE

## 2022-09-26 MED ORDER — ALBUTEROL SULFATE (2.5 MG/3ML) 0.083% IN NEBU: 3.0000 mL | INHALATION_SOLUTION | Freq: Two times a day (BID) | RESPIRATORY_TRACT | Status: DC | PRN

## 2022-09-26 MED ORDER — POLYETHYLENE GLYCOL 3350 17 G PO PACK
17.0000 g | PACK | Freq: Every day | ORAL | Status: DC
  Administered 2022-09-27 – 2022-09-28 (×2): 17 g via ORAL
  Filled 2022-09-26 (×6): qty 1

## 2022-09-26 MED ORDER — POTASSIUM CHLORIDE CRYS ER 20 MEQ PO TBCR
40.0000 meq | EXTENDED_RELEASE_TABLET | Freq: Once | ORAL | Status: AC
  Administered 2022-09-26: 40 meq via ORAL
  Filled 2022-09-26: qty 2

## 2022-09-26 MED ORDER — INSULIN GLARGINE-YFGN 100 UNIT/ML ~~LOC~~ SOLN
17.0000 [IU] | Freq: Every day | SUBCUTANEOUS | Status: DC
  Administered 2022-09-26: 17 [IU] via SUBCUTANEOUS
  Filled 2022-09-26 (×2): qty 0.17

## 2022-09-26 MED ORDER — OXYMETAZOLINE HCL 0.05 % NA SOLN: 1.0000 | Freq: Two times a day (BID) | NASAL | Status: AC | PRN

## 2022-09-26 MED ORDER — NASAL SPRAY 0.05 % NA SOLN: Freq: Two times a day (BID) | NASAL | Status: DC | PRN

## 2022-09-26 NOTE — TOC Progression Note (Addendum)
Transition of Care [facility]) - Progression Note    Patient Details  Name: Amanda Butler MRN: [id number] Date of Birth: [birth year]  Transition of Care [facility]) CM/SW Contact  Baldemar Lenis, [place] Phone Number: 09/26/2022, 3:52 PM  Clinical Narrative:   CSW received denial from [facility] for patient. CSW faxed out referral further, and met with patient multiple times to provide bed offers and engage in discussion about placement. Patient called daughter to discuss denial from [facility]. Daughter said she would try to get them to change their mind. Patient in agreement to reviewing other options in the meantime, ultimately leaning towards [facility] after review and discussion. Patient had lots of questions about placement and expectations, often tearful during discussion about likely needing long term care and losing her home and belongings. CSW to follow.  UPDATE: CSW confirmed bed availability with [facility] and sent request for insurance authorization to CMA. [facility] able to admit over the weekend if approved. CSW to follow.    Expected Discharge Plan: Skilled Nursing Facility Barriers to Discharge: Continued Medical Work up  Expected Discharge Plan and Services In-house Referral: Clinical Social Work   Post Acute Care Choice: Skilled Nursing Facility Living arrangements for the past 2 months: Single Family Home                                       Social Determinants of Health (SDOH) Interventions SDOH Screenings   Food Insecurity: No Food Insecurity (09/25/2022)  Housing: High Risk (09/25/2022)  Transportation Needs: No Transportation Needs (09/25/2022)  Utilities: At Risk (09/25/2022)  Depression (PHQ2-9): Low Risk  (02/25/2021)  Tobacco Use: Low Risk  (09/25/2022)    Readmission Risk Interventions     No data to display

## 2022-09-26 NOTE — Plan of Care (Signed)
  Problem: Metabolic: Goal: Ability to maintain appropriate glucose levels will improve Outcome: Not Progressing   Problem: Coping: Goal: Level of anxiety will decrease Outcome: Not Progressing   Problem: Safety: Goal: Ability to remain free from injury will improve Outcome: Not Progressing

## 2022-09-26 NOTE — Progress Notes (Signed)
PROGRESS NOTE   Amanda Butler  UJW:[id number] DOB: 1956-03-15 DOA: 09/24/2022 PCP: Blair Heys, MD   Date of Service: the patient was seen and examined on 09/26/2022  Brief Narrative:  Amanda Butler is a 67 y.o. female with medical history significant for obesity, chronic diastolic CHF, hypertension, hyperlipidemia, type II DM, presented to the hospital with generalized weakness, dysuria and fall at home.  She said she has had general weakness and difficulty ambulating/unsteady gait for some time but symptoms became more pronounced about 3 to 4 days preceding admission.  She's also had some diarrhea and she is not sure whether this is from some of her medicines.  She said when she fell she could not get up from the floor and so she could not get up to open the door when EMS arrived.  She said EMS had to break down her door to get in.     Assessment and Plan:  Acute UTI Symptoms are improving. Day 2 CTX .Urine culture grew multiple species -Continue IV ceftriaxone., de-escalate to PO abx as able     Vitamin B12 deficiency Vitamin B12 level was 169 - Start vitamin B12 injection daily x 5 doses     General weakness Pt has an unsteady gait and falls at home  -PT and OT recommend discharge to SNF, likely next week  Hypokalemia K 3.3 today, 3.7 yesterday. -40 meq of potassium prescribed today  -Continue to monitor with BMP   Prolonged QTc interval (459).  Qtc today is 459 from 522, improved but still prolonged -Continue to monitor    Type II DM with hyperglycemia CBGs overnight 279-304 likely in the setting of holding home medications.  Last Hemoglobin A1c was 9.1. -Increase Semglee 17 units daily.  -Continue sliding scale insulin NovoLog as needed for hyperglycemia.  -Continue to hold glimepiride and Jardiance    Hypertension BP 126/58 overnight  -Continue carvedilol and lisinopril.  -Use oral hydralazine as needed for severe hypertension.   Chronic diastolic  CHF -Continue Lasix   Chronic venous insufficiency/bilateral leg lymphedema/chronic venous ulcers on bilateral legs -Continue local wound care.   Subjective:  No acute events overnight. Pt is keen to be discharged. I explained it will likely be next week to a SNF. Pt is happy with this plan.   Physical Exam:  Vitals:   09/25/22 2353 09/26/22 0442 09/26/22 0608 09/26/22 0817  BP: 134/67 (!) 154/61  (!) 126/58  Pulse: (!) 57 60  (!) 52  Resp: 17 17    Temp: 97.6 F (36.4 C) 99.5 F (37.5 C)  97.9 F (36.6 C)  TempSrc: Oral Oral  Oral  SpO2: 100% 98%  99%  Weight:   98.3 kg   Height:        General: Alert, no acute distress, well appearing  Cardio: Normal S1 and S2, RRR, no r/m/g Pulm: CTAB, normal work of breathing Extremities: edematous legs covered in bandages  Neuro: Cranial nerves grossly intact    Data Reviewed:  I have personally reviewed and interpreted labs, imaging.  CBC: Recent Labs  Lab 09/24/22 1301 09/25/22 0336  WBC 8.4 6.0  NEUTROABS 7.1 5.5  HGB 13.9 13.9  HCT 41.2 40.7  MCV 81.9 83.2  PLT 191 184   Basic Metabolic Panel: Recent Labs  Lab 09/24/22 1301 09/25/22 0336 09/26/22 0433  NA 136 135 136  K 3.1* 3.7 3.3*  CL 97* 101 102  CO2 22 23 24   GLUCOSE 237* 355* 286*  BUN 12  12 16  CREATININE 0.79 0.73 0.96  CALCIUM 9.1 8.5* 8.3*  MG  --  1.9 1.9   GFR: Estimated Creatinine Clearance: 65.4 mL/min (by C-G formula based on SCr of 0.96 mg/dL). Liver Function Tests: Recent Labs  Lab 09/24/22 1301 09/25/22 0336  AST 15 15  ALT 13 13  ALKPHOS 70 69  BILITOT 2.0* 1.9*  PROT 7.4 7.2  ALBUMIN 3.3* 3.0*    Coagulation Profile: No results for input(s): "INR", "PROTIME" in the last 168 hours.   Code Status:  DNR.  Code status decision has been confirmed with: patient  Family Communication:     Severity of Illness:  The appropriate patient status for this patient is INPATIENT. Inpatient status is judged to be reasonable and  necessary in order to provide the required intensity of service to ensure the patient's safety. The patient's presenting symptoms, physical exam findings, and initial radiographic and laboratory data in the context of their chronic comorbidities is felt to place them at high risk for further clinical deterioration. Furthermore, it is not anticipated that the patient will be medically stable for discharge from the hospital within 2 midnights of admission.   * I certify that at the point of admission it is my clinical judgment that the patient will require inpatient hospital care spanning beyond 2 midnights from the point of admission due to high intensity of service, high risk for further deterioration and high frequency of surveillance required.*   Author:  Rolm Gala MD  09/26/2022 8:50 AM

## 2022-09-27 DIAGNOSIS — Z5919 Other inadequate housing: Secondary | ICD-10-CM | POA: Diagnosis not present

## 2022-09-27 DIAGNOSIS — R531 Weakness: Secondary | ICD-10-CM | POA: Diagnosis not present

## 2022-09-27 DIAGNOSIS — Z1152 Encounter for screening for COVID-19: Secondary | ICD-10-CM | POA: Diagnosis not present

## 2022-09-27 DIAGNOSIS — I4510 Unspecified right bundle-branch block: Secondary | ICD-10-CM | POA: Diagnosis present

## 2022-09-27 DIAGNOSIS — I872 Venous insufficiency (chronic) (peripheral): Secondary | ICD-10-CM | POA: Diagnosis present

## 2022-09-27 DIAGNOSIS — Z6834 Body mass index (BMI) 34.0-34.9, adult: Secondary | ICD-10-CM | POA: Diagnosis not present

## 2022-09-27 DIAGNOSIS — I160 Hypertensive urgency: Secondary | ICD-10-CM | POA: Diagnosis present

## 2022-09-27 DIAGNOSIS — E1165 Type 2 diabetes mellitus with hyperglycemia: Secondary | ICD-10-CM | POA: Diagnosis not present

## 2022-09-27 DIAGNOSIS — I890 Lymphedema, not elsewhere classified: Secondary | ICD-10-CM | POA: Diagnosis not present

## 2022-09-27 DIAGNOSIS — E669 Obesity, unspecified: Secondary | ICD-10-CM | POA: Diagnosis present

## 2022-09-27 DIAGNOSIS — I110 Hypertensive heart disease with heart failure: Secondary | ICD-10-CM | POA: Diagnosis not present

## 2022-09-27 DIAGNOSIS — E876 Hypokalemia: Secondary | ICD-10-CM | POA: Diagnosis present

## 2022-09-27 DIAGNOSIS — Z751 Person awaiting admission to adequate facility elsewhere: Secondary | ICD-10-CM | POA: Diagnosis not present

## 2022-09-27 DIAGNOSIS — E7800 Pure hypercholesterolemia, unspecified: Secondary | ICD-10-CM | POA: Diagnosis present

## 2022-09-27 DIAGNOSIS — Z0389 Encounter for observation for other suspected diseases and conditions ruled out: Secondary | ICD-10-CM | POA: Diagnosis not present

## 2022-09-27 DIAGNOSIS — Y92009 Unspecified place in unspecified non-institutional (private) residence as the place of occurrence of the external cause: Secondary | ICD-10-CM | POA: Diagnosis not present

## 2022-09-27 DIAGNOSIS — I83009 Varicose veins of unspecified lower extremity with ulcer of unspecified site: Secondary | ICD-10-CM | POA: Diagnosis not present

## 2022-09-27 DIAGNOSIS — L03116 Cellulitis of left lower limb: Secondary | ICD-10-CM | POA: Diagnosis not present

## 2022-09-27 DIAGNOSIS — L97929 Non-pressure chronic ulcer of unspecified part of left lower leg with unspecified severity: Secondary | ICD-10-CM | POA: Diagnosis present

## 2022-09-27 DIAGNOSIS — N390 Urinary tract infection, site not specified: Secondary | ICD-10-CM | POA: Diagnosis not present

## 2022-09-27 DIAGNOSIS — K219 Gastro-esophageal reflux disease without esophagitis: Secondary | ICD-10-CM | POA: Diagnosis present

## 2022-09-27 DIAGNOSIS — Z789 Other specified health status: Secondary | ICD-10-CM | POA: Diagnosis not present

## 2022-09-27 DIAGNOSIS — B369 Superficial mycosis, unspecified: Secondary | ICD-10-CM | POA: Diagnosis not present

## 2022-09-27 DIAGNOSIS — W19XXXA Unspecified fall, initial encounter: Secondary | ICD-10-CM | POA: Diagnosis not present

## 2022-09-27 DIAGNOSIS — L97919 Non-pressure chronic ulcer of unspecified part of right lower leg with unspecified severity: Secondary | ICD-10-CM | POA: Diagnosis not present

## 2022-09-27 DIAGNOSIS — Z91041 Radiographic dye allergy status: Secondary | ICD-10-CM | POA: Diagnosis not present

## 2022-09-27 DIAGNOSIS — E538 Deficiency of other specified B group vitamins: Secondary | ICD-10-CM | POA: Diagnosis present

## 2022-09-27 DIAGNOSIS — I5032 Chronic diastolic (congestive) heart failure: Secondary | ICD-10-CM | POA: Diagnosis not present

## 2022-09-27 DIAGNOSIS — Z66 Do not resuscitate: Secondary | ICD-10-CM | POA: Diagnosis not present

## 2022-09-27 DIAGNOSIS — J45909 Unspecified asthma, uncomplicated: Secondary | ICD-10-CM | POA: Diagnosis present

## 2022-09-27 LAB — CBC
HCT: 38.6 % (ref 36.0–46.0)
Hemoglobin: 13.2 g/dL (ref 12.0–15.0)
MCH: 28.3 pg (ref 26.0–34.0)
MCHC: 34.2 g/dL (ref 30.0–36.0)
MCV: 82.7 fL (ref 80.0–100.0)
Platelets: 196 10*3/uL (ref 150–400)
RBC: 4.67 MIL/uL (ref 3.87–5.11)
RDW: 13.9 % (ref 11.5–15.5)
WBC: 6.2 10*3/uL (ref 4.0–10.5)
nRBC: 0 % (ref 0.0–0.2)

## 2022-09-27 LAB — BASIC METABOLIC PANEL
Anion gap: 11 (ref 5–15)
BUN: 17 mg/dL (ref 8–23)
CO2: 25 mmol/L (ref 22–32)
Calcium: 8.4 mg/dL — ABNORMAL LOW (ref 8.9–10.3)
Chloride: 99 mmol/L (ref 98–111)
Creatinine, Ser: 0.9 mg/dL (ref 0.44–1.00)
GFR, Estimated: >60 mL/min (ref >60)
Glucose, Bld: 269 mg/dL — ABNORMAL HIGH (ref 70–99)
Potassium: 3.4 mmol/L — ABNORMAL LOW (ref 3.5–5.1)
Sodium: 135 mmol/L (ref 135–145)

## 2022-09-27 LAB — GLUCOSE, CAPILLARY
Glucose-Capillary: 241 mg/dL — ABNORMAL HIGH (ref 70–99)
Glucose-Capillary: 294 mg/dL — ABNORMAL HIGH (ref 70–99)
Glucose-Capillary: 308 mg/dL — ABNORMAL HIGH (ref 70–99)
Glucose-Capillary: 255 mg/dL — ABNORMAL HIGH (ref 70–99)

## 2022-09-27 MED ORDER — INSULIN ASPART 100 UNIT/ML IJ SOLN: 0.0000 [IU] | Freq: Three times a day (TID) | INTRAMUSCULAR | Status: DC

## 2022-09-27 MED ORDER — INSULIN GLARGINE-YFGN 100 UNIT/ML ~~LOC~~ SOLN
20.0000 [IU] | Freq: Every day | SUBCUTANEOUS | Status: DC
  Administered 2022-09-27: 20 [IU] via SUBCUTANEOUS
  Filled 2022-09-27: qty 0.2

## 2022-09-27 MED ORDER — INSULIN ASPART 100 UNIT/ML IJ SOLN
0.0000 [IU] | Freq: Three times a day (TID) | INTRAMUSCULAR | Status: DC
  Administered 2022-09-27: 11 [IU] via SUBCUTANEOUS
  Administered 2022-09-28: 3 [IU] via SUBCUTANEOUS
  Administered 2022-09-28: 11 [IU] via SUBCUTANEOUS
  Administered 2022-09-28: 5 [IU] via SUBCUTANEOUS

## 2022-09-27 MED ORDER — CEPHALEXIN 500 MG PO CAPS
500.0000 mg | ORAL_CAPSULE | Freq: Two times a day (BID) | ORAL | Status: DC
  Administered 2022-09-28 – 2022-10-01 (×7): 500 mg via ORAL
  Filled 2022-09-27 (×7): qty 1

## 2022-09-27 MED ORDER — POTASSIUM CHLORIDE CRYS ER 20 MEQ PO TBCR
40.0000 meq | EXTENDED_RELEASE_TABLET | Freq: Once | ORAL | Status: AC
  Administered 2022-09-27: 40 meq via ORAL
  Filled 2022-09-27: qty 2

## 2022-09-27 NOTE — Plan of Care (Signed)

## 2022-09-27 NOTE — Progress Notes (Signed)
PROGRESS NOTE   Amanda Butler  ZOX:[id number] DOB: 1955-06-06 DOA: 09/24/2022 PCP: Blair Heys, MD   Date of Service: the patient was seen and examined on 09/27/2022  Brief Narrative:  Amanda Butler is a 67 y.o. female with medical history significant for obesity, chronic diastolic CHF, hypertension, hyperlipidemia, type II DM, presented to the hospital with generalized weakness, dysuria and fall at home.  She said she has had general weakness and difficulty ambulating/unsteady gait for some time but symptoms became more pronounced about 3 to 4 days preceding admission.  She's also had some diarrhea and she is not sure whether this is from some of her medicines.  She said when she fell she could not get up from the floor and so she could not get up to open the door when EMS arrived.  She said EMS had to break down her door to get in.     Assessment and Plan:  Acute UTI Day 3 CTX. WBC 6.2, afebrile overnight urine culture grew multiple species. -Stop ceftriaxone, de-escalate to Keflex today for total of 7 days of antibiotics     Vitamin B12 deficiency Vitamin B12 level was 169 - Start vitamin B12 injection daily x 5 doses     General weakness Pt has an unsteady gait and falls at home  -PT and OT recommend discharge to SNF, likely next week  Hypokalemia K 3.4 today, 3.3 yesterday. -40 meq of potassium prescribed today  -Continue to monitor with BMP   Prolonged QTc interval (459).  Qtc today is 459 from 522, improved but still prolonged -Repeat EKG am -Continue to monitor    Type II DM with hyperglycemia CBGs overnight 241-246 likely in the setting of holding home medications.  Last Hemoglobin A1c was 9.1. -Increase Semglee 20 units daily.  -Continue sliding scale insulin NovoLog as needed for hyperglycemia.  -Continue to hold glimepiride and Jardiance    Hypertension BP 126/58 overnight  -Continue carvedilol and lisinopril.  -Use oral hydralazine as needed for severe  hypertension.   Chronic diastolic CHF -Continue Lasix   Chronic venous insufficiency/bilateral leg lymphedema/chronic venous ulcers on bilateral legs -Continue local wound care.   Subjective:  Doing well. No acute concerns.  Physical Exam:  Vitals:   09/26/22 1952 09/26/22 2344 09/27/22 0319 09/27/22 0728  BP: (!) 177/81 (!) 156/64 (!) 132/57 (!) 155/69  Pulse: (!) 58 (!) 57 (!) 59 (!) 55  Resp: 18 18 18 18   Temp: 98.9 F (37.2 C) 97.8 F (36.6 C) 98 F (36.7 C) 98.5 F (36.9 C)  TempSrc: Oral Oral Oral Oral  SpO2: 100% 99% 99% 99%  Weight:   97.7 kg   Height:        General: Alert, no acute distress, well appearing  Cardio: Normal S1 and S2, RRR, no r/m/g Pulm: CTAB, normal work of breathing Extremities: edematous legs covered in bandages  Neuro: Cranial nerves grossly intact    Data Reviewed:  I have personally reviewed and interpreted labs, imaging.  CBC: Recent Labs  Lab 09/24/22 1301 09/25/22 0336 09/27/22 0326  WBC 8.4 6.0 6.2  NEUTROABS 7.1 5.5  --   HGB 13.9 13.9 13.2  HCT 41.2 40.7 38.6  MCV 81.9 83.2 82.7  PLT 191 184 196    Basic Metabolic Panel: Recent Labs  Lab 09/24/22 1301 09/25/22 0336 09/26/22 0433 09/27/22 0326  NA 136 135 136 135  K 3.1* 3.7 3.3* 3.4*  CL 97* 101 102 99  CO2 22 23 24  25  GLUCOSE 237* 355* 286* 269*  BUN 12 12 16 17   CREATININE 0.79 0.73 0.96 0.90  CALCIUM 9.1 8.5* 8.3* 8.4*  MG  --  1.9 1.9  --     GFR: Estimated Creatinine Clearance: 69.6 mL/min (by C-G formula based on SCr of 0.9 mg/dL). Liver Function Tests: Recent Labs  Lab 09/24/22 1301 09/25/22 0336  AST 15 15  ALT 13 13  ALKPHOS 70 69  BILITOT 2.0* 1.9*  PROT 7.4 7.2  ALBUMIN 3.3* 3.0*     Coagulation Profile: No results for input(s): "INR", "PROTIME" in the last 168 hours.   Code Status:  DNR.  Code status decision has been confirmed with: patient  Family Communication:     Severity of Illness:  The appropriate patient status  for this patient is INPATIENT. Inpatient status is judged to be reasonable and necessary in order to provide the required intensity of service to ensure the patient's safety. The patient's presenting symptoms, physical exam findings, and initial radiographic and laboratory data in the context of their chronic comorbidities is felt to place them at high risk for further clinical deterioration. Furthermore, it is not anticipated that the patient will be medically stable for discharge from the hospital within 2 midnights of admission.   * I certify that at the point of admission it is my clinical judgment that the patient will require inpatient hospital care spanning beyond 2 midnights from the point of admission due to high intensity of service, high risk for further deterioration and high frequency of surveillance required.*   Author:  Rolm Gala MD  09/27/2022 8:59 AM

## 2022-09-27 NOTE — Plan of Care (Signed)
  Problem: Tissue Perfusion: Goal: Adequacy of tissue perfusion will improve Outcome: Not Progressing   Problem: Safety: Goal: Ability to remain free from injury will improve Outcome: Not Progressing   Problem: Skin Integrity: Goal: Risk for impaired skin integrity will decrease Outcome: Not Progressing

## 2022-09-27 NOTE — Progress Notes (Signed)
Per Grover Canavan with [employer] auth remains pending at this time. SW will provide updates as available.   Dellie Burns, MSW, LCSW [phone] (coverage)

## 2022-09-28 LAB — CBC
HCT: 37.8 % (ref 36.0–46.0)
Hemoglobin: 12.7 g/dL (ref 12.0–15.0)
MCH: 27.4 pg (ref 26.0–34.0)
MCHC: 33.6 g/dL (ref 30.0–36.0)
MCV: 81.5 fL (ref 80.0–100.0)
Platelets: 209 10*3/uL (ref 150–400)
RBC: 4.64 MIL/uL (ref 3.87–5.11)
RDW: 14 % (ref 11.5–15.5)
WBC: 6.9 10*3/uL (ref 4.0–10.5)
nRBC: 0 % (ref 0.0–0.2)

## 2022-09-28 LAB — GLUCOSE, CAPILLARY
Glucose-Capillary: 222 mg/dL — ABNORMAL HIGH (ref 70–99)
Glucose-Capillary: 324 mg/dL — ABNORMAL HIGH (ref 70–99)
Glucose-Capillary: 195 mg/dL — ABNORMAL HIGH (ref 70–99)
Glucose-Capillary: 302 mg/dL — ABNORMAL HIGH (ref 70–99)

## 2022-09-28 LAB — BASIC METABOLIC PANEL
Anion gap: 12 (ref 5–15)
BUN: 15 mg/dL (ref 8–23)
CO2: 26 mmol/L (ref 22–32)
Calcium: 8.7 mg/dL — ABNORMAL LOW (ref 8.9–10.3)
Chloride: 99 mmol/L (ref 98–111)
Creatinine, Ser: 0.73 mg/dL (ref 0.44–1.00)
GFR, Estimated: >60 mL/min (ref >60)
Glucose, Bld: 241 mg/dL — ABNORMAL HIGH (ref 70–99)
Potassium: 3.6 mmol/L (ref 3.5–5.1)
Sodium: 137 mmol/L (ref 135–145)

## 2022-09-28 MED ORDER — INSULIN ASPART 100 UNIT/ML IJ SOLN
0.0000 [IU] | Freq: Three times a day (TID) | INTRAMUSCULAR | Status: DC
  Administered 2022-09-29 (×2): 11 [IU] via SUBCUTANEOUS
  Administered 2022-09-29: 7 [IU] via SUBCUTANEOUS
  Administered 2022-09-30: 11 [IU] via SUBCUTANEOUS
  Administered 2022-09-30: 4 [IU] via SUBCUTANEOUS
  Administered 2022-09-30: 11 [IU] via SUBCUTANEOUS
  Administered 2022-10-01: 4 [IU] via SUBCUTANEOUS
  Administered 2022-10-01: 11 [IU] via SUBCUTANEOUS
  Administered 2022-10-01: 4 [IU] via SUBCUTANEOUS
  Administered 2022-10-02 (×2): 7 [IU] via SUBCUTANEOUS
  Administered 2022-10-02 – 2022-10-03 (×2): 11 [IU] via SUBCUTANEOUS
  Administered 2022-10-03: 7 [IU] via SUBCUTANEOUS

## 2022-09-28 MED ORDER — INSULIN ASPART 100 UNIT/ML IJ SOLN
0.0000 [IU] | Freq: Every day | INTRAMUSCULAR | Status: DC
  Administered 2022-09-28: 4 [IU] via SUBCUTANEOUS
  Administered 2022-09-29: 3 [IU] via SUBCUTANEOUS
  Administered 2022-09-30 – 2022-10-01 (×2): 2 [IU] via SUBCUTANEOUS
  Administered 2022-10-02: 3 [IU] via SUBCUTANEOUS

## 2022-09-28 MED ORDER — INSULIN GLARGINE-YFGN 100 UNIT/ML ~~LOC~~ SOLN
25.0000 [IU] | Freq: Every day | SUBCUTANEOUS | Status: DC
  Administered 2022-09-28: 25 [IU] via SUBCUTANEOUS
  Filled 2022-09-28 (×2): qty 0.25

## 2022-09-28 MED ORDER — INSULIN ASPART 100 UNIT/ML IJ SOLN
10.0000 [IU] | Freq: Once | INTRAMUSCULAR | Status: AC
  Administered 2022-09-28: 10 [IU] via SUBCUTANEOUS

## 2022-09-28 NOTE — Progress Notes (Signed)
Physical Therapy Treatment Patient Details Name: Amanda Butler MRN: [id number] DOB: 1955-12-20 Today's Date: 09/28/2022   History of Present Illness Pt is a 67 y/o F admitted on 09/24/22 after presenting with c/o generalized weakness x 3-4 days. PMH: chronic diastolic heart failure, essential pretension, HLD, DM2, morbid obesity, RBBB    PT Comments    Pt pleasant and motivated to participate. Able to perform seated warm up BLE exercises and ambulating limited hallway distances with a walker at a min guard assist level. Gait speed of 1.08 ft/s and history of recurrent falls places pt at increased fall risk. Patient will benefit from continued inpatient follow up therapy, <3 hours/day to address balance, strengthening, endurance.    Recommendations for follow up therapy are one component of a multi-disciplinary discharge planning process, led by the attending physician.  Recommendations may be updated based on patient status, additional functional criteria and insurance authorization.  Follow Up Recommendations  Can patient physically be transported by private vehicle: Yes    Assistance Recommended at Discharge Set up Supervision/Assistance  Patient can return home with the following A little help with walking and/or transfers;A little help with bathing/dressing/bathroom;Assistance with cooking/housework;Help with stairs or ramp for entrance;Assist for transportation   Equipment Recommendations  None recommended by PT    Recommendations for Other Services       Precautions / Restrictions Precautions Precautions: Fall Restrictions Weight Bearing Restrictions: No     Mobility  Bed Mobility               General bed mobility comments: OOB in chair    Transfers Overall transfer level: Needs assistance Equipment used: Rolling walker (2 wheels) Transfers: Sit to/from Stand Sit to Stand: Min guard                Ambulation/Gait Ambulation/Gait assistance: Min  guard Gait Distance (Feet): 120 Feet Assistive device: Rolling walker (2 wheels) Gait Pattern/deviations: Decreased stride length, Decreased dorsiflexion - right, Decreased dorsiflexion - left, Trunk flexed Gait velocity: 1.08 ft/s Gait velocity interpretation: <1.31 ft/sec, indicative of household ambulator   General Gait Details: Pt self cueing for walker proximity   Stairs             Wheelchair Mobility    Modified Rankin (Stroke Patients Only)       Balance Overall balance assessment: Needs assistance, History of Falls Sitting-balance support: No upper extremity supported, Feet supported Sitting balance-Leahy Scale: Good     Standing balance support: During functional activity, Single extremity supported Standing balance-Leahy Scale: Poor Standing balance comment: reliant on at least single UE support                            Cognition Arousal/Alertness: Awake/alert Behavior During Therapy: Impulsive Overall Cognitive Status: No family/caregiver present to determine baseline cognitive functioning                                 General Comments: Pt hyperverbose, but follows commands        Exercises General Exercises - Lower Extremity Long Arc Quad: Both, 10 reps, Seated Hip Flexion/Marching: Both, 10 reps, Seated Toe Raises: Both, 10 reps, Seated    General Comments        Pertinent Vitals/Pain Pain Assessment Pain Assessment: No/denies pain    Home Living  Prior Function            PT Goals (current goals can now be found in the care plan section) Acute Rehab PT Goals Potential to Achieve Goals: Good Progress towards PT goals: Progressing toward goals    Frequency    Min 2X/week      PT Plan Frequency needs to be updated    Co-evaluation              AM-PAC PT "6 Clicks" Mobility   Outcome Measure  Help needed turning from your back to your side while in a  flat bed without using bedrails?: None Help needed moving from lying on your back to sitting on the side of a flat bed without using bedrails?: None Help needed moving to and from a bed to a chair (including a wheelchair)?: A Little Help needed standing up from a chair using your arms (e.g., wheelchair or bedside chair)?: A Little Help needed to walk in hospital room?: A Little Help needed climbing 3-5 steps with a railing? : A Little 6 Click Score: 20    End of Session Equipment Utilized During Treatment: Gait belt Activity Tolerance: Patient tolerated treatment well Patient left: in chair;with call bell/phone within reach;with chair alarm set Nurse Communication: Mobility status PT Visit Diagnosis: History of falling (Z91.81);Muscle weakness (generalized) (M62.81)     Time: 1610-9604 PT Time Calculation (min) (ACUTE ONLY): 13 min  Charges:  $Therapeutic Activity: 8-22 mins                     Lillia Pauls, PT, DPT Acute Rehabilitation Services Office [phone]    Amanda Butler 09/28/2022, 1:54 PM

## 2022-09-28 NOTE — Plan of Care (Signed)

## 2022-09-28 NOTE — Progress Notes (Signed)
PROGRESS NOTE   Amanda Butler  ZOX:[id number] DOB: July 06, 1955 DOA: 09/24/2022 PCP: Blair Heys, MD   Date of Service: the patient was seen and examined on 09/28/2022  Brief Narrative:  Amanda Butler is a 67 y.o. female with medical history significant for obesity, chronic diastolic CHF, hypertension, hyperlipidemia, type II DM, presented to the hospital with generalized weakness, dysuria and fall at home.  She said she has had general weakness and difficulty ambulating/unsteady gait for some time but symptoms became more pronounced about 3 to 4 days preceding admission.  She's also had some diarrhea and she is not sure whether this is from some of her medicines.  She said when she fell she could not get up from the floor and so she could not get up to open the door when EMS arrived.  She said EMS had to break down her door to get in.     Assessment and Plan:  Pt is medically stable for discharge, awaiting SNF placement    Acute UTI, treating Treated with CTX 6/13-6/15. WBC 6.9, afebrile. Overnight urine culture grew multiple species. -Continue Keflex for total of 7 days of antibiotics     General weakness  Falls  Unsafe housing environment  Pt has an unsteady gait and falls at home  -On going PT/OT while in hospital  -Discharge to SNF likely on 6/16 or 6/17  Vitamin B12 deficiency Vitamin B12 level 169 -Vitamin B12 injection daily x 5 doses     Hypokalemia K 3.6 today, stable -Continue to monitor with BMP   Prolonged QTc interval (459).  -Continue to monitor    Type II DM CBGs overnight 222-255 likely in the setting of holding home medications.  Last Hemoglobin A1c was 9.1. -Increase Semglee 25 units daily.  -Continue sliding scale insulin NovoLog as needed for hyperglycemia.  -Continue to hold glimepiride and Jardiance    Hypertension BP elevated to 151/65 overnight  -Continue carvedilol and lisinopril.  -Use oral hydralazine as needed for severe  hypertension -Needs outpatient monitoring    Chronic diastolic CHF -Continue Lasix   Chronic venous insufficiency/bilateral leg lymphedema/chronic venous ulcers on bilateral legs -Continue local wound care.   Subjective:  Pt feels well. Expressing concern about disposition. She would like to go SNF where her daughter is at [facility] but they did not accept her. May be ok going to [place] as second choice.   Physical Exam:  Vitals:   09/27/22 1952 09/28/22 0012 09/28/22 0417 09/28/22 0821  BP: (!) 167/67 126/87 (!) 170/79 (!) 151/65  Pulse: (!) 56 60 (!) 53 (!) 55  Resp: 16 16 16    Temp: 98.4 F (36.9 C) 97.7 F (36.5 C) 97.9 F (36.6 C) 98.7 F (37.1 C)  TempSrc: Oral Axillary Oral Oral  SpO2: 97% 96% 99% 100%  Weight:      Height:        General: Alert, no acute distress, well appearing, sat up in chair  Cardio: well perfused  Pulm: normal work of breathing Extremities: edematous legs covered in bandages  Neuro: Cranial nerves grossly intact    Data Reviewed:  I have personally reviewed and interpreted labs, imaging.  CBC: Recent Labs  Lab 09/24/22 1301 09/25/22 0336 09/27/22 0326 09/28/22 0417  WBC 8.4 6.0 6.2 6.9  NEUTROABS 7.1 5.5  --   --   HGB 13.9 13.9 13.2 12.7  HCT 41.2 40.7 38.6 37.8  MCV 81.9 83.2 82.7 81.5  PLT 191 184 196 209  Basic Metabolic Panel: Recent Labs  Lab 09/24/22 1301 09/25/22 0336 09/26/22 0433 09/27/22 0326 09/28/22 0417  NA 136 135 136 135 137  K 3.1* 3.7 3.3* 3.4* 3.6  CL 97* 101 102 99 99  CO2 22 23 24 25 26   GLUCOSE 237* 355* 286* 269* 241*  BUN 12 12 16 17 15   CREATININE 0.79 0.73 0.96 0.90 0.73  CALCIUM 9.1 8.5* 8.3* 8.4* 8.7*  MG  --  1.9 1.9  --   --     GFR: Estimated Creatinine Clearance: 78.3 mL/min (by C-G formula based on SCr of 0.73 mg/dL). Liver Function Tests: Recent Labs  Lab 09/24/22 1301 09/25/22 0336  AST 15 15  ALT 13 13  ALKPHOS 70 69  BILITOT 2.0* 1.9*  PROT 7.4 7.2   ALBUMIN 3.3* 3.0*     Coagulation Profile: No results for input(s): "INR", "PROTIME" in the last 168 hours.   Code Status:  DNR.  Code status decision has been confirmed with: patient  Family Communication:     Severity of Illness:  The appropriate patient status for this patient is INPATIENT. Inpatient status is judged to be reasonable and necessary in order to provide the required intensity of service to ensure the patient's safety. The patient's presenting symptoms, physical exam findings, and initial radiographic and laboratory data in the context of their chronic comorbidities is felt to place them at high risk for further clinical deterioration. Furthermore, it is not anticipated that the patient will be medically stable for discharge from the hospital within 2 midnights of admission.   * I certify that at the point of admission it is my clinical judgment that the patient will require inpatient hospital care spanning beyond 2 midnights from the point of admission due to high intensity of service, high risk for further deterioration and high frequency of surveillance required.*   Author:  Rolm Gala MD  09/28/2022 9:06 AM

## 2022-09-29 LAB — GLUCOSE, CAPILLARY
Glucose-Capillary: 222 mg/dL — ABNORMAL HIGH (ref 70–99)
Glucose-Capillary: 276 mg/dL — ABNORMAL HIGH (ref 70–99)
Glucose-Capillary: 252 mg/dL — ABNORMAL HIGH (ref 70–99)
Glucose-Capillary: 274 mg/dL — ABNORMAL HIGH (ref 70–99)

## 2022-09-29 LAB — BASIC METABOLIC PANEL
Anion gap: 10 (ref 5–15)
BUN: 19 mg/dL (ref 8–23)
CO2: 26 mmol/L (ref 22–32)
Calcium: 8.7 mg/dL — ABNORMAL LOW (ref 8.9–10.3)
Chloride: 98 mmol/L (ref 98–111)
Creatinine, Ser: 0.84 mg/dL (ref 0.44–1.00)
GFR, Estimated: >60 mL/min (ref >60)
Glucose, Bld: 191 mg/dL — ABNORMAL HIGH (ref 70–99)
Potassium: 3.6 mmol/L (ref 3.5–5.1)
Sodium: 134 mmol/L — ABNORMAL LOW (ref 135–145)

## 2022-09-29 MED ORDER — INSULIN ASPART 100 UNIT/ML IJ SOLN
4.0000 [IU] | Freq: Three times a day (TID) | INTRAMUSCULAR | Status: DC
  Administered 2022-09-29 – 2022-10-02 (×9): 4 [IU] via SUBCUTANEOUS

## 2022-09-29 MED ORDER — INSULIN GLARGINE-YFGN 100 UNIT/ML ~~LOC~~ SOLN
32.0000 [IU] | Freq: Every day | SUBCUTANEOUS | Status: DC
  Administered 2022-09-29 – 2022-10-03 (×5): 32 [IU] via SUBCUTANEOUS
  Filled 2022-09-29 (×5): qty 0.32

## 2022-09-29 MED ORDER — INSULIN ASPART 100 UNIT/ML IJ SOLN
0.0000 [IU] | Freq: Three times a day (TID) | INTRAMUSCULAR | Status: DC
Start: 2022-09-29 — End: 2022-09-29

## 2022-09-29 NOTE — Progress Notes (Signed)
Mobility Specialist Progress Note   09/29/22 1030  Mobility  Activity Ambulated with assistance in hallway  Level of Assistance Contact guard assist, steadying assist  Assistive Device Front wheel walker  Distance Ambulated (ft) 150 ft  Activity Response Tolerated well  Mobility Referral Yes  $Mobility charge 1 Mobility  Mobility Specialist Start Time (ACUTE ONLY) 1030  Mobility Specialist Stop Time (ACUTE ONLY) 1107  Mobility Specialist Time Calculation (min) (ACUTE ONLY) 37 min   Received in chair having no complaints and agreeable. Pt able to ambulate in hallway w/ steady gait but having decreased activity tolerance and fatiguing quickly. Returned back to chair w/o fault, call bell in reach and chair alarm on.   Frederico Hamman Mobility Specialist Please contact via SecureChat or  Rehab office at [phone]

## 2022-09-29 NOTE — Inpatient Diabetes Management (Signed)
Inpatient Diabetes Program Recommendations  AACE/ADA: New Consensus Statement on Inpatient Glycemic Control (2015)  Target Ranges:  Prepandial:   less than 140 mg/dL      Peak postprandial:   less than 180 mg/dL (1-2 hours)      Critically ill patients:  140 - 180 mg/dL   Lab Results  Component Value Date   GLUCAP 222 (H) 09/29/2022   HGBA1C 9.1 (H) 09/25/2022    Review of Glycemic Control  Diabetes history: DM2 Outpatient Diabetes medications: Amaryl 8 mg QAM, Jardiance 10 mg QD Current orders for Inpatient glycemic control: Semglee 32 every day, Novolog 0-20 units TID with meals and 0-5 HS  HgbA1C - 9.1%  Inpatient Diabetes Program Recommendations:    Consider adding Novolog 3-4 units TID with meals if eating > 50%.   Continue to follow.  Thank you. Ailene Ards, RD, LDN, CDCES Inpatient Diabetes Coordinator [phone]

## 2022-09-29 NOTE — Progress Notes (Signed)
PROGRESS NOTE   Amanda Butler  ZOX:[id number] DOB: [birth year] DOA: 09/24/2022 PCP: Blair Heys, MD   Date of Service: the patient was seen and examined on 09/29/2022  Brief Narrative:  Amanda Butler is a 67 y.o. female with medical history significant for obesity, chronic diastolic CHF, hypertension, hyperlipidemia, type II DM, presented to the hospital with generalized weakness, dysuria and fall at home.  She said she has had general weakness and difficulty ambulating/unsteady gait for some time but symptoms became more pronounced about 3 to 4 days preceding admission.  She's also had some diarrhea and she is not sure whether this is from some of her medicines.  She said when she fell she could not get up from the floor and so she could not get up to open the door when EMS arrived.  She said EMS had to break down her door to get in.     Assessment and Plan:  Pt is medically stable for discharge, awaiting SNF placement    Acute UTI, treating Treated with CTX 6/13-6/15.  -Continue Keflex for total of 7 days of antibiotics until 6/19     General weakness  Falls  Unsafe housing environment  -On going PT/OT while in hospital  -Discharge to [facility] likely on 6/17  Vitamin B12 deficiency Vitamin B12 level 169 -Treated with vitamin B12 injection daily x 5 doses     Hypokalemia K 3.6 today, stable -Continue to monitor with BMP   Prolonged QTc interval (459).  -Continue to monitor    Type II DM CBGs overnight 302. Last Hemoglobin A1c was 9.1. -Increase Semglee 32 units daily.  -Continue sliding scale insulin -Novolog bedtime coverage 0-5 units - Adding Novolog 3-4 units TID with meals if eating > 50%  -Continue to hold glimepiride and Jardiance    Hypertension BP 120/57  -Continue carvedilol and lisinopril.  -Use oral hydralazine as needed for severe hypertension -Needs outpatient monitoring    Chronic diastolic CHF -Continue Lasix   Chronic venous  insufficiency/bilateral leg lymphedema/chronic venous ulcers on bilateral legs -Continue local wound care.   Subjective:  No acute concerns. Pt has chosen to go to [place] and will let the CSW today.   Physical Exam:  Vitals:   09/28/22 1600 09/28/22 1953 09/28/22 2316 09/29/22 0406  BP: (!) 169/80 (!) 141/66 (!) 148/81 (!) 120/57  Pulse: 68 66 66 (!) 52  Resp: 18 19 18 16   Temp: 97.8 F (36.6 C) 98 F (36.7 C) 97.8 F (36.6 C) 97.6 F (36.4 C)  TempSrc: Oral Oral Oral Oral  SpO2: 99% 96% 96% 100%  Weight:      Height:        General: Alert, no acute distress, well appearing, sat up in chair  Cardio: well perfused  Pulm: normal work of breathing Extremities: edematous legs covered in bandages  Neuro: Cranial nerves grossly intact   Data Reviewed:  I have personally reviewed and interpreted labs, imaging.  CBC: Recent Labs  Lab 09/24/22 1301 09/25/22 0336 09/27/22 0326 09/28/22 0417  WBC 8.4 6.0 6.2 6.9  NEUTROABS 7.1 5.5  --   --   HGB 13.9 13.9 13.2 12.7  HCT 41.2 40.7 38.6 37.8  MCV 81.9 83.2 82.7 81.5  PLT 191 184 196 209    Basic Metabolic Panel: Recent Labs  Lab 09/25/22 0336 09/26/22 0433 09/27/22 0326 09/28/22 0417 09/29/22 0347  NA 135 136 135 137 134*  K 3.7 3.3* 3.4* 3.6 3.6  CL 101 102  99 99 98  CO2 23 24 25 26 26   GLUCOSE 355* 286* 269* 241* 191*  BUN 12 16 17 15 19   CREATININE 0.73 0.96 0.90 0.73 0.84  CALCIUM 8.5* 8.3* 8.4* 8.7* 8.7*  MG 1.9 1.9  --   --   --     GFR: Estimated Creatinine Clearance: 74.5 mL/min (by C-G formula based on SCr of 0.84 mg/dL). Liver Function Tests: Recent Labs  Lab 09/24/22 1301 09/25/22 0336  AST 15 15  ALT 13 13  ALKPHOS 70 69  BILITOT 2.0* 1.9*  PROT 7.4 7.2  ALBUMIN 3.3* 3.0*     Coagulation Profile: No results for input(s): "INR", "PROTIME" in the last 168 hours.   Code Status:  DNR.  Code status decision has been confirmed with: patient  Family Communication:     Severity  of Illness:  The appropriate patient status for this patient is INPATIENT. Inpatient status is judged to be reasonable and necessary in order to provide the required intensity of service to ensure the patient's safety. The patient's presenting symptoms, physical exam findings, and initial radiographic and laboratory data in the context of their chronic comorbidities is felt to place them at high risk for further clinical deterioration. Furthermore, it is not anticipated that the patient will be medically stable for discharge from the hospital within 2 midnights of admission.   * I certify that at the point of admission it is my clinical judgment that the patient will require inpatient hospital care spanning beyond 2 midnights from the point of admission due to high intensity of service, high risk for further deterioration and high frequency of surveillance required.*   Author:  Rolm Gala MD  09/29/2022 9:07 AM

## 2022-09-29 NOTE — Care Management Important Message (Signed)
Important Message  Patient Details  Name: Amanda Butler MRN: [id number] Date of Birth: Aug 23, 1955   Medicare Important Message Given:  Yes     Barb Shear Stefan Church 09/29/2022, 3:55 PM

## 2022-09-30 DIAGNOSIS — I890 Lymphedema, not elsewhere classified: Secondary | ICD-10-CM

## 2022-09-30 DIAGNOSIS — L03116 Cellulitis of left lower limb: Secondary | ICD-10-CM

## 2022-09-30 DIAGNOSIS — Z5919 Other inadequate housing: Secondary | ICD-10-CM

## 2022-09-30 DIAGNOSIS — W19XXXA Unspecified fall, initial encounter: Secondary | ICD-10-CM

## 2022-09-30 DIAGNOSIS — Z789 Other specified health status: Secondary | ICD-10-CM

## 2022-09-30 DIAGNOSIS — R531 Weakness: Secondary | ICD-10-CM | POA: Diagnosis not present

## 2022-09-30 LAB — GLUCOSE, CAPILLARY
Glucose-Capillary: 190 mg/dL — ABNORMAL HIGH (ref 70–99)
Glucose-Capillary: 252 mg/dL — ABNORMAL HIGH (ref 70–99)
Glucose-Capillary: 273 mg/dL — ABNORMAL HIGH (ref 70–99)
Glucose-Capillary: 233 mg/dL — ABNORMAL HIGH (ref 70–99)

## 2022-09-30 NOTE — Progress Notes (Signed)
Occupational Therapy Treatment Patient Details Name: Amanda Butler MRN: [id number] DOB: 1955-06-14 Today's Date: 09/30/2022   History of present illness Pt is a 67 y/o F admitted on 09/24/22 after presenting with c/o generalized weakness x 3-4 days. PMH: chronic diastolic heart failure, essential pretension, HLD, DM2, morbid obesity, RBBB   OT comments  Session focused on UE HEP education using level 2 theraband with min verbal cues needed for technique. Provided written handout to maximize overall carryover. Instructed to complete exercises daily.    Recommendations for follow up therapy are one component of a multi-disciplinary discharge planning process, led by the attending physician.  Recommendations may be updated based on patient status, additional functional criteria and insurance authorization.    Assistance Recommended at Discharge Frequent or constant Supervision/Assistance  Patient can return home with the following  A little help with bathing/dressing/bathroom;Assistance with cooking/housework;Direct supervision/assist for medications management;Direct supervision/assist for financial management;Assist for transportation;Help with stairs or ramp for entrance   Equipment Recommendations  None recommended by OT    Recommendations for Other Services      Precautions / Restrictions Precautions Precautions: Fall Restrictions Weight Bearing Restrictions: No       Mobility Bed Mobility               General bed mobility comments: in chair on entry    Transfers                         Balance Overall balance assessment: Needs assistance, History of Falls Sitting-balance support: No upper extremity supported, Feet supported Sitting balance-Leahy Scale: Good                                     ADL either performed or assessed with clinical judgement   ADL Overall ADL's : Needs assistance/impaired                                        General ADL Comments: Focus on UE HEP education to maximize overall UB strength/endurance    Extremity/Trunk Assessment Upper Extremity Assessment Upper Extremity Assessment: Overall WFL for tasks assessed   Lower Extremity Assessment Lower Extremity Assessment: Defer to PT evaluation        Vision   Vision Assessment?: No apparent visual deficits   Perception     Praxis      Cognition Arousal/Alertness: Awake/alert Behavior During Therapy: Restless Overall Cognitive Status: No family/caregiver present to determine baseline cognitive functioning                                 General Comments: Pt hyperverbose, but follows commands        Exercises Exercises: General Upper Extremity General Exercises - Upper Extremity Shoulder Flexion: Strengthening, Both, 15 reps, Seated, Theraband Theraband Level (Shoulder Flexion): Level 2 (Red) Shoulder Horizontal ABduction: Strengthening, Both, 15 reps, Seated, Theraband Theraband Level (Shoulder Horizontal Abduction): Level 2 (Red) Elbow Flexion: Strengthening, Both, 15 reps, Seated, Theraband Theraband Level (Elbow Flexion): Level 2 (Red) Elbow Extension: Strengthening, Both, 15 reps, Seated, Theraband Theraband Level (Elbow Extension): Level 2 (Red)    Shoulder Instructions       General Comments      Pertinent Vitals/ Pain  Pain Assessment Pain Assessment: No/denies pain  Home Living                                          Prior Functioning/Environment              Frequency  Min 2X/week        Progress Toward Goals  OT Goals(current goals can now be found in the care plan section)  Progress towards OT goals: Progressing toward goals  Acute Rehab OT Goals Patient Stated Goal: be able to stay at [facility] with daughter OT Goal Formulation: With patient Time For Goal Achievement: 10/09/22 Potential to Achieve Goals: Fair ADL Goals Pt Will  Perform Lower Body Dressing: with modified independence;sit to/from stand Pt Will Transfer to Toilet: with modified independence;ambulating Additional ADL Goal #1: Pt to sustain attention to ADL/functional tasks 3 min with no more than min verbal cues Additional ADL Goal #2: Pt to complete pill box test with 0 errors Additional ADL Goal #3: Pt to increase standing activity tolerance > 10 min during ADLs/mobility without seated rest break  Plan Discharge plan remains appropriate    Co-evaluation                 AM-PAC OT "6 Clicks" Daily Activity     Outcome Measure   Help from another person eating meals?: None Help from another person taking care of personal grooming?: A Little Help from another person toileting, which includes using toliet, bedpan, or urinal?: A Little Help from another person bathing (including washing, rinsing, drying)?: A Little Help from another person to put on and taking off regular upper body clothing?: A Little Help from another person to put on and taking off regular lower body clothing?: A Little 6 Click Score: 19    End of Session    OT Visit Diagnosis: Unsteadiness on feet (R26.81);Muscle weakness (generalized) (M62.81);History of falling (Z91.81)   Activity Tolerance Patient tolerated treatment well   Patient Left in chair;with call bell/phone within reach;with chair alarm set   Nurse Communication Mobility status        Time: 8295-6213 OT Time Calculation (min): 22 min  Charges: OT General Charges $OT Visit: 1 Visit OT Treatments $Therapeutic Exercise: 8-22 mins  Bradd Canary, OTR/L Acute Rehab Services Office: [phone]   Lorre Munroe 09/30/2022, 9:03 AM

## 2022-09-30 NOTE — TOC Progression Note (Signed)
Transition of Care [facility]) - Progression Note    Patient Details  Name: ZDENKA LYDIC MRN: [id number] Date of Birth: [birth year]  Transition of Care [facility]) CM/SW Contact  Baldemar Lenis, [place] Phone Number: 09/30/2022, 10:37 AM  Clinical Narrative:   CSW unable to find insurance authorization for patient, requested to CMA for update on insurance authorization. Authorization was initiated for [employer]. CSW spoke with patient's daughter to confirm that she was unable to obtain a bed for patient at [facility], and daughter confirmed plan to admit to [facility]. CSW received multiple calls from daughter with concerns about patient's anxiety related to SNF, and CSW answered questions and discussed SNF placement expectations. Daughter indicated understanding and said she would relay messages back to patient. CSW updated RN about barriers to discharge. CSW to follow.    Expected Discharge Plan: Skilled Nursing Facility Barriers to Discharge: Continued Medical Work up  Expected Discharge Plan and Services In-house Referral: Clinical Social Work   Post Acute Care Choice: Skilled Nursing Facility Living arrangements for the past 2 months: Single Family Home                                       Social Determinants of Health (SDOH) Interventions SDOH Screenings   Food Insecurity: No Food Insecurity (09/25/2022)  Housing: High Risk (09/25/2022)  Transportation Needs: No Transportation Needs (09/25/2022)  Utilities: At Risk (09/25/2022)  Depression (PHQ2-9): Low Risk  (02/25/2021)  Tobacco Use: Low Risk  (09/25/2022)    Readmission Risk Interventions     No data to display

## 2022-09-30 NOTE — Progress Notes (Signed)
PROGRESS NOTE   Amanda Butler  ZOX:[id number] DOB: June 30, 1955 DOA: 09/24/2022 PCP: Blair Heys, MD   Date of Service: the patient was seen and examined on 09/30/2022  Brief Narrative:  Amanda Butler is a 67 y.o. female with medical history significant for obesity, chronic diastolic CHF, hypertension, hyperlipidemia, type II DM, presented to the hospital with generalized weakness, dysuria and fall at home.  She said she has had general weakness and difficulty ambulating/unsteady gait for some time but symptoms became more pronounced about 3 to 4 days preceding admission.  She's also had some diarrhea and she is not sure whether this is from some of her medicines.  She said when she fell she could not get up from the floor and so she could not get up to open the door when EMS arrived.  She said EMS had to break down her door to get in. 6/18: She has a "condemned" house and will not be able to return, per patient. She denies current urinary symptoms.    Assessment and Plan:  Pt is medically stable for discharge, awaiting SNF placement    Acute UTI- resolved Treated with CTX 6/13-6/15.  -Continue Keflex for total of 7 days of antibiotics until 6/19     General weakness  Falls  Unsafe housing environment  -On going PT/OT while in hospital  -Discharge to [facility]. Pending insurance auth. TOC is engaged.   Vitamin B12 deficiency Vitamin B12 level 169 -Treated with vitamin B12 injection daily x 5 doses     Hypokalemia- resolved.  -Continue to monitor and replete PRN   Prolonged QTc interval (459).  -Continue to monitor    Type II DM- Last Hemoglobin A1c was 9.1. -continue Semglee 32 units daily.  -Continue sliding scale insulin -Novolog bedtime coverage 0-5 units - Adding Novolog 3-4 units TID with meals if eating > 50%  -Continue to hold glimepiride and Jardiance    Hypertension- poorly controlled -Continue carvedilol and lisinopril.  -Needs outpatient monitoring     Chronic diastolic CHF -Continue Lasix   Chronic venous insufficiency/bilateral leg lymphedema/chronic venous ulcers on bilateral legs -Continue local wound care.   Subjective: Pt reports no medical concerns or complaints. She describes a lot of social discord around her housing loss. Denies dysuria, SOB, CP.   Physical Exam:  Vitals:   09/29/22 2008 09/29/22 2343 09/30/22 0349 09/30/22 0500  BP: (!) 154/69 (!) 148/58 (!) 125/57   Pulse: (!) 55 (!) 50 (!) 53   Resp: 18 16 16    Temp: 97.8 F (36.6 C) 97.8 F (36.6 C) 97.6 F (36.4 C)   TempSrc: Oral  Oral   SpO2: 100% 99% 98%   Weight:    97.6 kg  Height:       General: Alert, no acute distress, well appearing, sitting up in chair  Cardio: well perfused  Pulm: normal work of breathing Extremities: mildly edematous legs covered in bandages  Neuro: Cranial nerves grossly intact   Data Reviewed:  I have personally reviewed and interpreted labs, imaging.  CBC: Recent Labs  Lab 09/24/22 1301 09/25/22 0336 09/27/22 0326 09/28/22 0417  WBC 8.4 6.0 6.2 6.9  NEUTROABS 7.1 5.5  --   --   HGB 13.9 13.9 13.2 12.7  HCT 41.2 40.7 38.6 37.8  MCV 81.9 83.2 82.7 81.5  PLT 191 184 196 209    Basic Metabolic Panel: Recent Labs  Lab 09/25/22 0336 09/26/22 0433 09/27/22 0326 09/28/22 0417 09/29/22 0347  NA 135 136  135 137 134*  K 3.7 3.3* 3.4* 3.6 3.6  CL 101 102 99 99 98  CO2 23 24 25 26 26   GLUCOSE 355* 286* 269* 241* 191*  BUN 12 16 17 15 19   CREATININE 0.73 0.96 0.90 0.73 0.84  CALCIUM 8.5* 8.3* 8.4* 8.7* 8.7*  MG 1.9 1.9  --   --   --     GFR: Estimated Creatinine Clearance: 74.4 mL/min (by C-G formula based on SCr of 0.84 mg/dL). Liver Function Tests: Recent Labs  Lab 09/24/22 1301 09/25/22 0336  AST 15 15  ALT 13 13  ALKPHOS 70 69  BILITOT 2.0* 1.9*  PROT 7.4 7.2  ALBUMIN 3.3* 3.0*    Code Status:  DNR.  Code status decision has been confirmed with: patient  Family Communication:     Severity  of Illness:  The appropriate patient status for this patient is INPATIENT. Inpatient status is judged to be reasonable and necessary in order to provide the required intensity of service to ensure the patient's safety. The patient's presenting symptoms, physical exam findings, and initial radiographic and laboratory data in the context of their chronic comorbidities is felt to place them at high risk for further clinical deterioration. Furthermore, it is not anticipated that the patient will be medically stable for discharge from the hospital within 2 midnights of admission.   * I certify that at the point of admission it is my clinical judgment that the patient will require inpatient hospital care spanning beyond 2 midnights from the point of admission due to high intensity of service, high risk for further deterioration and high frequency of surveillance required.*   Author:  Leeroy Bock MD  09/30/2022 7:44 AM

## 2022-10-01 DIAGNOSIS — R531 Weakness: Secondary | ICD-10-CM | POA: Diagnosis not present

## 2022-10-01 LAB — GLUCOSE, CAPILLARY
Glucose-Capillary: 161 mg/dL — ABNORMAL HIGH (ref 70–99)
Glucose-Capillary: 277 mg/dL — ABNORMAL HIGH (ref 70–99)
Glucose-Capillary: 159 mg/dL — ABNORMAL HIGH (ref 70–99)
Glucose-Capillary: 226 mg/dL — ABNORMAL HIGH (ref 70–99)

## 2022-10-01 NOTE — Inpatient Diabetes Management (Signed)
Inpatient Diabetes Program Recommendations  AACE/ADA: New Consensus Statement on Inpatient Glycemic Control (2015)  Target Ranges:  Prepandial:   less than 140 mg/dL      Peak postprandial:   less than 180 mg/dL (1-2 hours)      Critically ill patients:  140 - 180 mg/dL   Lab Results  Component Value Date   GLUCAP 161 (H) 10/01/2022   HGBA1C 9.1 (H) 09/25/2022    Review of Glycemic Control  Latest Reference Range & Units 09/30/22 06:22 09/30/22 10:55 09/30/22 16:13 09/30/22 21:57 10/01/22 06:22  Glucose-Capillary 70 - 99 mg/dL 161 (H)  Novolog 8 units  Semglee 32 units 252 (H)  Novolog 15 units 273 (H)  Novolog 15 units 233 (H)  Novolog 2 units 161 (H)  Novolog 8 units   Diabetes history: DM2 Outpatient Diabetes medications: Amaryl 8 mg QAM, Jardiance 10 mg every day, Prandin 1 mg Daily Current orders for Inpatient glycemic control:  Semglee 32 units Daily Novolog 0-20 units TID with meals and 0-5 HS Novolog 4 units tid  HgbA1C - 9.1%  Inpatient Diabetes Program Recommendations:    -    Consider increasing Novolog meal coverage to 8 units TID with meals if eating > 50%.   Continue to follow.  Thanks,  Christena Deem RN, MSN, BC-ADM Inpatient Diabetes Coordinator Team Pager [phone] (8a-5p)

## 2022-10-01 NOTE — TOC Progression Note (Signed)
Transition of Care [facility]) - Progression Note    Patient Details  Name: Amanda Butler MRN: [id number] Date of Birth: 09-May-1955  Transition of Care [facility]) CM/SW Contact  Baldemar Lenis, [place] Phone Number: 10/01/2022, 3:52 PM  Clinical Narrative:   CSW checked multiple times throughout the day on patient's insurance authorization, it remains pending. CSW updated MD and Lacinda Axon. CSW to follow.    Expected Discharge Plan: Skilled Nursing Facility Barriers to Discharge: Continued Medical Work up  Expected Discharge Plan and Services In-house Referral: Clinical Social Work   Post Acute Care Choice: Skilled Nursing Facility Living arrangements for the past 2 months: Single Family Home                                       Social Determinants of Health (SDOH) Interventions SDOH Screenings   Food Insecurity: No Food Insecurity (09/25/2022)  Housing: High Risk (09/25/2022)  Transportation Needs: No Transportation Needs (09/25/2022)  Utilities: At Risk (09/25/2022)  Depression (PHQ2-9): Low Risk  (02/25/2021)  Tobacco Use: Low Risk  (09/25/2022)    Readmission Risk Interventions     No data to display

## 2022-10-01 NOTE — Progress Notes (Signed)
PROGRESS NOTE   Amanda Butler  ZOX:[id number] DOB: [birth year] DOA: 09/24/2022 PCP: Blair Heys, MD   Date of Service: the patient was seen and examined on 10/01/2022  Brief Narrative:  Amanda Butler is a 67 y.o. female with medical history significant for obesity, chronic diastolic CHF, hypertension, hyperlipidemia, type II DM, presented to the hospital with generalized weakness, dysuria and fall at home.  She said she has had general weakness and difficulty ambulating/unsteady gait for some time but symptoms became more pronounced about 3 to 4 days preceding admission.  She's also had some diarrhea and she is not sure whether this is from some of her medicines.  She said when she fell she could not get up from the floor and so she could not get up to open the door when EMS arrived.  She said EMS had to break down her door to get in. 6/18: She has a "condemned" house and will not be able to return, per patient. She denies current urinary symptoms.    Assessment and Plan:  Acute UTI- resolved Treated with CTX 6/13-6/15.  -Continue Keflex for total of 7 days of antibiotics until 6/19 no complaints or symptoms    General weakness  Falls  Unsafe housing environment  -On going PT/OT while in hospital  -Discharge to [facility].  awaiting authorization  Vitamin B12 deficiency Vitamin B12 level 169 -Treated with vitamin B12 injection daily x 5 doses earlier during hospital stay   Hypokalemia- resolved.  -Every 48-72 labs   Prolonged QTc interval (459).  -Telemetry discontinued previously    Type II DM- Last Hemoglobin A1c was 9.1. -continue Semglee 32 units daily.  -Continue sliding scale insulin-CBGs are ranging between 116 and 280 -Novolog bedtime coverage 0-5 units - Adding Novolog 3-4 units TID with meals if eating > 50%  -Continue to hold glimepiride and Jardiance    Hypertension- poorly controlled -Continue carvedilol 12.5 twice daily and lisinopril 40 daily -Needs  outpatient monitoring    Chronic diastolic CHF -Continue Lasix 40 daily   Chronic venous insufficiency/bilateral leg lymphedema/chronic venous ulcers on bilateral legs -Continue local wound care--- currently being dressed with Interdry as well as silver alginate as per wound nurse   Subjective:  Appears to be doing okay No distress Eating drinking no fever no chills  Physical Exam:  Vitals:   09/30/22 2342 10/01/22 0817 10/01/22 1128 10/01/22 1531  BP: (!) 137/58 (!) 146/71 113/69 132/61  Pulse: (!) 55 (!) 56 (!) 52 (!) 51  Resp: 18 16 18 18   Temp: 98 F (36.7 C) 98.2 F (36.8 C) 97.9 F (36.6 C) 98.2 F (36.8 C)  TempSrc: Oral Oral Oral Oral  SpO2: 97% 99% 99% 100%  Weight:      Height:       Coherent awake no distress Chest clear Abdomen soft no rebound no guarding ROM intact moving 4 limbs equally S1-S2 no murmur not on telemetry  Data Reviewed:  I have personally reviewed and interpreted labs, imaging.  CBC: Recent Labs  Lab 09/25/22 0336 09/27/22 0326 09/28/22 0417  WBC 6.0 6.2 6.9  NEUTROABS 5.5  --   --   HGB 13.9 13.2 12.7  HCT 40.7 38.6 37.8  MCV 83.2 82.7 81.5  PLT 184 196 209    Basic Metabolic Panel: Recent Labs  Lab 09/25/22 0336 09/26/22 0433 09/27/22 0326 09/28/22 0417 09/29/22 0347  NA 135 136 135 137 134*  K 3.7 3.3* 3.4* 3.6 3.6  CL 101 102  99 99 98  CO2 23 24 25 26 26   GLUCOSE 355* 286* 269* 241* 191*  BUN 12 16 17 15 19   CREATININE 0.73 0.96 0.90 0.73 0.84  CALCIUM 8.5* 8.3* 8.4* 8.7* 8.7*  MG 1.9 1.9  --   --   --     GFR: Estimated Creatinine Clearance: 74.4 mL/min (by C-G formula based on SCr of 0.84 mg/dL). Liver Function Tests: Recent Labs  Lab 09/25/22 0336  AST 15  ALT 13  ALKPHOS 69  BILITOT 1.9*  PROT 7.2  ALBUMIN 3.0*    Code Status:  DNR.  Code status decision has been confirmed with: patient  Family Communication: None present at time of evaluation   Rhetta Mura MD  10/01/2022 4:03  PM

## 2022-10-01 NOTE — Plan of Care (Signed)
  Problem: Education: Goal: Ability to describe self-care measures that may prevent or decrease complications (Diabetes Survival Skills Education) will improve Outcome: Progressing   Problem: Fluid Volume: Goal: Ability to maintain a balanced intake and output will improve Outcome: Progressing   Problem: Health Behavior/Discharge Planning: Goal: Ability to identify and utilize available resources and services will improve Outcome: Progressing Goal: Ability to manage health-related needs will improve Outcome: Progressing   Problem: Nutritional: Goal: Maintenance of adequate nutrition will improve Outcome: Progressing   Problem: Skin Integrity: Goal: Risk for impaired skin integrity will decrease Outcome: Progressing   Problem: Education: Goal: Knowledge of General Education information will improve Description: Including pain rating scale, medication(s)/side effects and non-pharmacologic comfort measures Outcome: Progressing   Problem: Activity: Goal: Risk for activity intolerance will decrease Outcome: Progressing

## 2022-10-01 NOTE — Progress Notes (Signed)
Physical Therapy Treatment Patient Details Name: Amanda Butler MRN: [id number] DOB: [birth year] Today's Date: 10/01/2022   History of Present Illness Pt is a 67 y/o F admitted on 09/24/22 after presenting with c/o generalized weakness x 3-4 days. PMH: chronic diastolic heart failure, essential pretension, HLD, DM2, morbid obesity, RBBB    PT Comments    Pt received up in chair and eager to participate. Able to complete seated BUE/BLE exercises and ambulating limited hallway distances with a walker at a min guard assist level. Continues with weakness, impaired standing balance, decreased gait speed, activity tolerance. Patient will benefit from continued inpatient follow up therapy, <3 hours/day.    Recommendations for follow up therapy are one component of a multi-disciplinary discharge planning process, led by the attending physician.  Recommendations may be updated based on patient status, additional functional criteria and insurance authorization.  Follow Up Recommendations  Can patient physically be transported by private vehicle: Yes    Assistance Recommended at Discharge Set up Supervision/Assistance  Patient can return home with the following A little help with walking and/or transfers;A little help with bathing/dressing/bathroom;Assistance with cooking/housework;Help with stairs or ramp for entrance;Assist for transportation   Equipment Recommendations  None recommended by PT    Recommendations for Other Services       Precautions / Restrictions Precautions Precautions: Fall Restrictions Weight Bearing Restrictions: No     Mobility  Bed Mobility               General bed mobility comments: in chair on entry    Transfers Overall transfer level: Needs assistance Equipment used: Rolling walker (2 wheels) Transfers: Sit to/from Stand Sit to Stand: Supervision                Ambulation/Gait Ambulation/Gait assistance: Min guard Gait Distance (Feet): 150  Feet (150", 100") Assistive device: Rolling walker (2 wheels) Gait Pattern/deviations: Decreased stride length, Decreased dorsiflexion - right, Decreased dorsiflexion - left, Trunk flexed Gait velocity: decreased     General Gait Details: Cues for upright posture and activity pacing   Stairs             Wheelchair Mobility    Modified Rankin (Stroke Patients Only)       Balance Overall balance assessment: Needs assistance, History of Falls Sitting-balance support: No upper extremity supported, Feet supported Sitting balance-Leahy Scale: Good     Standing balance support: Bilateral upper extremity supported, During functional activity Standing balance-Leahy Scale: Poor                              Cognition Arousal/Alertness: Awake/alert Behavior During Therapy: WFL for tasks assessed/performed Overall Cognitive Status: No family/caregiver present to determine baseline cognitive functioning                                 General Comments: Pt hyperverbose, but follows commands        Exercises General Exercises - Upper Extremity Shoulder Flexion: Strengthening, Both, 15 reps, Seated, Theraband Theraband Level (Shoulder Flexion): Level 2 (Red) General Exercises - Lower Extremity Long Arc Quad: Both, 10 reps, Seated Hip Flexion/Marching: Both, 10 reps, Seated    General Comments        Pertinent Vitals/Pain Pain Assessment Pain Assessment: Faces Faces Pain Scale: Hurts a little bit Pain Location: R knee (chronic) Pain Descriptors / Indicators: Discomfort Pain Intervention(s): Monitored during session  Home Living                          Prior Function            PT Goals (current goals can now be found in the care plan section) Acute Rehab PT Goals Patient Stated Goal: to go rehab Potential to Achieve Goals: Good Progress towards PT goals: Progressing toward goals    Frequency    Min 2X/week       PT Plan Current plan remains appropriate    Co-evaluation              AM-PAC PT "6 Clicks" Mobility   Outcome Measure  Help needed turning from your back to your side while in a flat bed without using bedrails?: None Help needed moving from lying on your back to sitting on the side of a flat bed without using bedrails?: None Help needed moving to and from a bed to a chair (including a wheelchair)?: A Little Help needed standing up from a chair using your arms (e.g., wheelchair or bedside chair)?: A Little Help needed to walk in hospital room?: A Little Help needed climbing 3-5 steps with a railing? : A Little 6 Click Score: 20    End of Session Equipment Utilized During Treatment: Gait belt Activity Tolerance: Patient tolerated treatment well Patient left: in chair;with call bell/phone within reach;with chair alarm set Nurse Communication: Mobility status PT Visit Diagnosis: History of falling (Z91.81);Muscle weakness (generalized) (M62.81)     Time: 0102-7253 PT Time Calculation (min) (ACUTE ONLY): 16 min  Charges:  $Gait Training: 8-22 mins                     Lillia Pauls, PT, DPT Acute Rehabilitation Services Office [phone]    Norval Morton 10/01/2022, 9:30 AM

## 2022-10-01 NOTE — TOC Progression Note (Signed)
Transition of Care [facility]) - Progression Note    Patient Details  Name: Amanda Butler MRN: [id number] Date of Birth: March 20, 1956  [facility]) CM/SW Contact  Baldemar Lenis, [place] Phone Number: 10/01/2022, 3:51 PM  Clinical Narrative:   CSW checked on patient's insurance authorization multiple times throughout the day today, it is still pending. CSW updated daughter, MD, and [facility]. CSW to follow.    Expected Discharge Plan: Skilled Nursing Facility Barriers to Discharge: Continued Medical Work up  Expected Discharge Plan and Services In-house Referral: Clinical Social Work   Post Acute Care Choice: Skilled Nursing Facility Living arrangements for the past 2 months: Single Family Home                                       Social Determinants of Health (SDOH) Interventions SDOH Screenings   Food Insecurity: No Food Insecurity (09/25/2022)  Housing: High Risk (09/25/2022)  Transportation Needs: No Transportation Needs (09/25/2022)  Utilities: At Risk (09/25/2022)  Depression (PHQ2-9): Low Risk  (02/25/2021)  Tobacco Use: Low Risk  (09/25/2022)    Readmission Risk Interventions     No data to display

## 2022-10-02 ENCOUNTER — Inpatient Hospital Stay (HOSPITAL_COMMUNITY): Payer: Medicare HMO

## 2022-10-02 DIAGNOSIS — R531 Weakness: Secondary | ICD-10-CM | POA: Diagnosis not present

## 2022-10-02 LAB — GLUCOSE, CAPILLARY
Glucose-Capillary: 201 mg/dL — ABNORMAL HIGH (ref 70–99)
Glucose-Capillary: 225 mg/dL — ABNORMAL HIGH (ref 70–99)
Glucose-Capillary: 275 mg/dL — ABNORMAL HIGH (ref 70–99)
Glucose-Capillary: 266 mg/dL — ABNORMAL HIGH (ref 70–99)

## 2022-10-02 MED ORDER — FUROSEMIDE 20 MG PO TABS
20.0000 mg | ORAL_TABLET | Freq: Every day | ORAL | Status: DC
  Administered 2022-10-03: 20 mg via ORAL
  Filled 2022-10-02: qty 1

## 2022-10-02 MED ORDER — CARVEDILOL 6.25 MG PO TABS
6.2500 mg | ORAL_TABLET | Freq: Two times a day (BID) | ORAL | Status: DC
  Administered 2022-10-02 – 2022-10-03 (×2): 6.25 mg via ORAL
  Filled 2022-10-02 (×2): qty 1

## 2022-10-02 MED ORDER — INSULIN ASPART 100 UNIT/ML IJ SOLN
6.0000 [IU] | Freq: Three times a day (TID) | INTRAMUSCULAR | Status: DC
  Administered 2022-10-02 – 2022-10-03 (×3): 6 [IU] via SUBCUTANEOUS

## 2022-10-02 MED ORDER — LISINOPRIL 10 MG PO TABS
10.0000 mg | ORAL_TABLET | Freq: Every day | ORAL | Status: DC
  Administered 2022-10-03: 10 mg via ORAL
  Filled 2022-10-02: qty 1

## 2022-10-02 NOTE — TOC Progression Note (Signed)
Transition of Care [facility]) - Progression Note    Patient Details  Name: Amanda Butler MRN: [id number] Date of Birth: 12-Dec-1955  Transition of Care [facility]) CM/SW Contact  Baldemar Lenis, [place] Phone Number: 10/02/2022, 4:39 PM  Clinical Narrative:   CSW following for discharge to SNF. Patient's insurance remains pending. CSW asked CMA to check on status, if anything can be done to get patient approved for SNF. CSW updated daughter and [facility]on.   CSW received call from daughter with concern about patient's heart rate. CSW relayed message to MD. CSW to follow.    Expected Discharge Plan: Skilled Nursing Facility Barriers to Discharge: Continued Medical Work up  Expected Discharge Plan and Services In-house Referral: Clinical Social Work   Post Acute Care Choice: Skilled Nursing Facility Living arrangements for the past 2 months: Single Family Home                                       Social Determinants of Health (SDOH) Interventions SDOH Screenings   Food Insecurity: No Food Insecurity (09/25/2022)  Housing: High Risk (09/25/2022)  Transportation Needs: No Transportation Needs (09/25/2022)  Utilities: At Risk (09/25/2022)  Depression (PHQ2-9): Low Risk  (02/25/2021)  Tobacco Use: Low Risk  (09/25/2022)    Readmission Risk Interventions     No data to display

## 2022-10-02 NOTE — Inpatient Diabetes Management (Signed)
Inpatient Diabetes Program Recommendations  AACE/ADA: New Consensus Statement on Inpatient Glycemic Control (2015)  Target Ranges:  Prepandial:   less than 140 mg/dL      Peak postprandial:   less than 180 mg/dL (1-2 hours)      Critically ill patients:  140 - 180 mg/dL   Lab Results  Component Value Date   GLUCAP 201 (H) 10/02/2022   HGBA1C 9.1 (H) 09/25/2022    Review of Glycemic Control  Latest Reference Range & Units 09/30/22 06:22 09/30/22 10:55 09/30/22 16:13 09/30/22 21:57 10/01/22 06:22  Glucose-Capillary 70 - 99 mg/dL 409 (H)  Novolog 8 units  Semglee 32 units 252 (H)  Novolog 15 units 273 (H)  Novolog 15 units 233 (H)  Novolog 2 units 161 (H)  Novolog 8 units    Latest Reference Range & Units 10/01/22 06:22 10/01/22 11:27 10/01/22 16:35 10/01/22 20:53 10/02/22 06:02  Glucose-Capillary 70 - 99 mg/dL 811 (H) 914 (H) 782 (H) 226 (H) 201 (H)   Diabetes history: DM2 Outpatient Diabetes medications: Amaryl 8 mg QAM, Jardiance 10 mg every day, Prandin 1 mg Daily Current orders for Inpatient glycemic control:  Semglee 32 units Daily Novolog 0-20 units TID with meals and 0-5 HS Novolog 4 units tid  HgbA1C - 9.1%  Inpatient Diabetes Program Recommendations:    -    Consider increasing Novolog meal coverage to 8 units TID with meals if eating > 50%.   Continue to follow.  Thanks,  Christena Deem RN, MSN, BC-ADM Inpatient Diabetes Coordinator Team Pager [phone] (8a-5p)

## 2022-10-02 NOTE — Plan of Care (Signed)
  Problem: Education: Goal: Ability to describe self-care measures that may prevent or decrease complications (Diabetes Survival Skills Education) will improve Outcome: Progressing   Problem: Fluid Volume: Goal: Ability to maintain a balanced intake and output will improve Outcome: Progressing   Problem: Health Behavior/Discharge Planning: Goal: Ability to manage health-related needs will improve Outcome: Progressing   Problem: Nutritional: Goal: Maintenance of adequate nutrition will improve Outcome: Progressing   Problem: Skin Integrity: Goal: Risk for impaired skin integrity will decrease Outcome: Progressing   Problem: Education: Goal: Knowledge of General Education information will improve Description: Including pain rating scale, medication(s)/side effects and non-pharmacologic comfort measures Outcome: Progressing   Problem: Activity: Goal: Risk for activity intolerance will decrease Outcome: Progressing
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
## 2022-10-02 NOTE — Progress Notes (Addendum)
Patient off the unit for CT  1734   Patient back to the unit

## 2022-10-03 DIAGNOSIS — Z743 Need for continuous supervision: Secondary | ICD-10-CM | POA: Diagnosis not present

## 2022-10-03 DIAGNOSIS — I700 Atherosclerosis of aorta: Secondary | ICD-10-CM | POA: Diagnosis not present

## 2022-10-03 DIAGNOSIS — W19XXXA Unspecified fall, initial encounter: Secondary | ICD-10-CM | POA: Diagnosis not present

## 2022-10-03 DIAGNOSIS — R531 Weakness: Secondary | ICD-10-CM | POA: Diagnosis not present

## 2022-10-03 DIAGNOSIS — I10 Essential (primary) hypertension: Secondary | ICD-10-CM | POA: Diagnosis not present

## 2022-10-03 DIAGNOSIS — R269 Unspecified abnormalities of gait and mobility: Secondary | ICD-10-CM | POA: Diagnosis not present

## 2022-10-03 DIAGNOSIS — N3000 Acute cystitis without hematuria: Secondary | ICD-10-CM | POA: Diagnosis not present

## 2022-10-03 DIAGNOSIS — I152 Hypertension secondary to endocrine disorders: Secondary | ICD-10-CM | POA: Diagnosis not present

## 2022-10-03 DIAGNOSIS — I110 Hypertensive heart disease with heart failure: Secondary | ICD-10-CM | POA: Diagnosis not present

## 2022-10-03 DIAGNOSIS — Z7401 Bed confinement status: Secondary | ICD-10-CM | POA: Diagnosis not present

## 2022-10-03 DIAGNOSIS — M5136 Other intervertebral disc degeneration, lumbar region: Secondary | ICD-10-CM | POA: Diagnosis not present

## 2022-10-03 DIAGNOSIS — K219 Gastro-esophageal reflux disease without esophagitis: Secondary | ICD-10-CM | POA: Diagnosis not present

## 2022-10-03 DIAGNOSIS — I878 Other specified disorders of veins: Secondary | ICD-10-CM | POA: Diagnosis not present

## 2022-10-03 DIAGNOSIS — E669 Obesity, unspecified: Secondary | ICD-10-CM | POA: Diagnosis not present

## 2022-10-03 DIAGNOSIS — I890 Lymphedema, not elsewhere classified: Secondary | ICD-10-CM | POA: Diagnosis not present

## 2022-10-03 DIAGNOSIS — R0602 Shortness of breath: Secondary | ICD-10-CM | POA: Diagnosis not present

## 2022-10-03 DIAGNOSIS — I5030 Unspecified diastolic (congestive) heart failure: Secondary | ICD-10-CM | POA: Diagnosis not present

## 2022-10-03 DIAGNOSIS — E1169 Type 2 diabetes mellitus with other specified complication: Secondary | ICD-10-CM | POA: Diagnosis not present

## 2022-10-03 DIAGNOSIS — R5381 Other malaise: Secondary | ICD-10-CM | POA: Diagnosis not present

## 2022-10-03 DIAGNOSIS — E1159 Type 2 diabetes mellitus with other circulatory complications: Secondary | ICD-10-CM | POA: Diagnosis not present

## 2022-10-03 DIAGNOSIS — E785 Hyperlipidemia, unspecified: Secondary | ICD-10-CM | POA: Diagnosis not present

## 2022-10-03 DIAGNOSIS — J45901 Unspecified asthma with (acute) exacerbation: Secondary | ICD-10-CM | POA: Diagnosis not present

## 2022-10-03 DIAGNOSIS — E538 Deficiency of other specified B group vitamins: Secondary | ICD-10-CM | POA: Diagnosis not present

## 2022-10-03 LAB — GLUCOSE, CAPILLARY
Glucose-Capillary: 215 mg/dL — ABNORMAL HIGH (ref 70–99)
Glucose-Capillary: 270 mg/dL — ABNORMAL HIGH (ref 70–99)

## 2022-10-03 MED ORDER — CARVEDILOL 12.5 MG PO TABS: 12.5000 mg | ORAL_TABLET | Freq: Two times a day (BID) | ORAL | Status: DC

## 2022-10-03 MED ORDER — LISINOPRIL 10 MG PO TABS: 10.0000 mg | ORAL_TABLET | Freq: Every day | ORAL | Status: AC

## 2022-10-03 MED ORDER — CARVEDILOL 6.25 MG PO TABS: 6.2500 mg | ORAL_TABLET | Freq: Two times a day (BID) | ORAL | 1 refills | Status: AC

## 2022-10-03 MED ORDER — FUROSEMIDE 20 MG PO TABS: 20.0000 mg | ORAL_TABLET | Freq: Every day | ORAL | Status: AC

## 2022-10-03 MED ORDER — INSULIN GLARGINE-YFGN 100 UNIT/ML ~~LOC~~ SOLN: 36.0000 [IU] | Freq: Every day | SUBCUTANEOUS | Status: DC

## 2022-10-03 NOTE — TOC Transition Note (Signed)
Transition of Care [facility]) - CM/SW Discharge Note   Patient Details  Name: Amanda Butler MRN: [id number] Date of Birth: Nov 02, 1955  Transition of Care [facility]) CM/SW Contact:  Ralene Bathe, LCSW Phone Number: 10/03/2022, 2:10 PM   Clinical Narrative:    Patient will DC to: [place] Anticipated DC date: 10/03/2022 Family notified: VM left for daughter Rhiannon Transport by: Sharin Mons   Per MD patient ready for DC to SNF. RN to call report prior to discharge [phone] room 207B). RN, patient's family, and facility notified of DC. Discharge Summary sent to facility.  Ambulance transport will be requested for patient.   CSW will sign off for now as social work intervention is no longer needed. Please consult [place] again if new needs arise.      Barriers to Discharge: Continued Medical Work up   Patient Goals and CMS Choice CMS Medicare.gov Compare Post Acute Care list provided to:: Patient Choice offered to / list presented to : Patient  Discharge Placement                Patient [facility] bed at:  [facility]) Patient to be transferred to facility by: [facility] Name of family member notified: left VM for daughter Rhiannon Patient and family notified of of transfer: 10/03/22  Discharge Plan and Services Additional resources added to the After Visit Summary for   In-house Referral: Clinical Social Work   Post Acute Care Choice: Skilled Nursing Facility                               Social Determinants of Health (SDOH) Interventions SDOH Screenings   Food Insecurity: No Food Insecurity (09/25/2022)  Housing: High Risk (09/25/2022)  Transportation Needs: No Transportation Needs (09/25/2022)  Utilities: At Risk (09/25/2022)  Depression (PHQ2-9): Low Risk  (02/25/2021)  Tobacco Use: Low Risk  (09/25/2022)     Readmission Risk Interventions     No data to display
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
## 2022-10-03 NOTE — TOC Progression Note (Addendum)
Transition of Care [facility]) - Initial/Assessment Note    Patient Details  Name: Amanda Butler MRN: [id number] Date of Birth: [birth year]  [facility]) CM/SW Contact:    Ralene Bathe, LCSW Phone Number: 10/03/2022, 10:45 AM  Clinical Narrative:                 Addendum 14:00- Insurance authorization is showing as approved.  Facility notified   Addendum: 13:07-  LCSW received call from Crystal in admissions at [facility] informing LCSW that insurance authorization is still showing as "pending" and patient cannot be transferred to facility until it shows as "approved".  LCSW contacted TOC CMA and requested that the insurance company be contacted for clarification.     Addendum: 11:30-  LCSW informed by MD that insurance [employer] was approved.  Crystal in admissions notified and facility can accept patient today.     LCSW notified that the insurance company is requesting that a peer to peer be completed by 4:30pm today.  The number to call is [phone] option 3 or 5. [phone]. MD informed.  TOC following.   Expected Discharge Plan: Skilled Nursing Facility Barriers to Discharge: Continued Medical Work up   Patient Goals and CMS Choice   CMS Medicare.gov Compare Post Acute Care list provided to:: Patient Choice offered to / list presented to : Patient      Expected Discharge Plan and Services In-house Referral: Clinical Social Work   Post Acute Care Choice: Skilled Nursing Facility Living arrangements for the past 2 months: Single Family Home                                      Prior Living Arrangements/Services Living arrangements for the past 2 months: Single Family Home Lives with:: Self Patient language and need for interpreter reviewed:: Yes Do you feel safe going back to the place where you live?: Yes      Need for Family Participation in Patient Care: Yes (Comment) Care giver support system in place?: No (comment) Current home services:  DME (walker) Criminal Activity/Legal Involvement Pertinent to Current Situation/Hospitalization: No - Comment as needed  Activities of Daily Living Home Assistive Devices/Equipment: Walker (specify type), CBG Meter ADL Screening (condition at time of admission) Patient's cognitive ability adequate to safely complete daily activities?: Yes Is the patient deaf or have difficulty hearing?: No Does the patient have difficulty seeing, even when wearing glasses/contacts?: Yes Does the patient have difficulty concentrating, remembering, or making decisions?: No Patient able to express need for assistance with ADLs?: Yes Does the patient have difficulty dressing or bathing?: No Independently performs ADLs?: Yes (appropriate for developmental age) Does the patient have difficulty walking or climbing stairs?: Yes Weakness of Legs: Both Weakness of Arms/Hands: None  Permission Sought/Granted                  Emotional Assessment Appearance:: Appears stated age Attitude/Demeanor/Rapport: Engaged Affect (typically observed): Accepting Orientation: : Oriented to Self, Oriented to Place, Oriented to  Time, Oriented to Situation   Psych Involvement: No (comment)  Admission diagnosis:  Lymphedema [I89.0] Cellulitis of left lower extremity [L03.116] Generalized weakness [R53.1] Unsatisfactory living conditions [Z59.19] Fall, initial encounter [W19.XXXA] Unable to care for self [Z78.9] UTI (urinary tract infection) [N39.0] Patient Active Problem List   Diagnosis Date Noted   Lymphedema 09/30/2022   Unable to care for self 09/30/2022   Unsatisfactory living conditions 09/30/2022  Fall 09/26/2022   Vitamin B12 deficiency 09/25/2022   Generalized weakness 09/24/2022   Acute cystitis 09/24/2022   Positive D dimer 09/24/2022   SOB (shortness of breath) 05/30/2022   Noncompliance 10/16/2021   UTI (urinary tract infection) 05/08/2021   Candidal skin infection 05/07/2021   Low TSH level  05/07/2021   Prolonged QT interval 05/07/2021   Postural dizziness with presyncope 05/07/2021   Cough    Urinary tract infection without hematuria    Venous stasis 05/06/2021   Sepsis (HCC) 05/06/2021   Dehydration 05/06/2021   Cellulitis of left lower extremity 02/19/2021   Acute lower UTI 01/21/2021   Hypokalemia 01/21/2021   Hypertensive urgency 01/21/2021   Ulcers of both lower extremities, limited to breakdown of skin (HCC)    Cellulitis of left leg 11/16/2018   Accelerated hypertension 09/19/2018   Pressure injury of skin 02/20/2018   Cellulitis 10/02/2017   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   Closed nondisplaced fracture of greater tuberosity of left humerus 04/23/2017   DM2 (diabetes mellitus, type 2) (HCC)    Hyperlipidemia    Morbid obesity (HCC)    Chronic diastolic CHF (congestive heart failure) (HCC)    Midsternal chest pain    Pain in the chest    Essential hypertension    Morbid obesity due to excess calories (HCC)    Chest pain 09/13/2015   Bilateral lower extremity edema 09/13/2015   Diabetes mellitus type 2, diet-controlled (HCC) 09/13/2015   Obesity 09/13/2015   Total bilirubin, elevated 09/13/2015   HTN (hypertension) 09/13/2015   Stasis dermatitis of both legs 09/13/2015   Left leg cellulitis 09/13/2015   RBBB 09/13/2015   Asthma 09/13/2015   GERD (gastroesophageal reflux disease) 09/13/2015   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   PCP:  Blair Heys, MD Pharmacy:   [facility] [phone] - [place], [place] - [address] AT [facility] [address] [place] [address] Phone: [phone] Fax: [phone]     Social Determinants of Health (SDOH) Social History: SDOH Screenings   Food Insecurity: No Food Insecurity (09/25/2022)  Housing: High Risk (09/25/2022)  Transportation Needs: No Transportation Needs (09/25/2022)  Utilities: At Risk (09/25/2022)  Depression (PHQ2-9):  Low Risk  (02/25/2021)  Tobacco Use: Low Risk  (09/25/2022)   SDOH Interventions:     Readmission Risk Interventions     No data to display

## 2022-10-03 NOTE — Plan of Care (Signed)
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
## 2022-10-03 NOTE — Plan of Care (Signed)

## 2022-10-06 DIAGNOSIS — E1169 Type 2 diabetes mellitus with other specified complication: Secondary | ICD-10-CM | POA: Diagnosis not present

## 2022-10-06 DIAGNOSIS — I152 Hypertension secondary to endocrine disorders: Secondary | ICD-10-CM | POA: Diagnosis not present

## 2022-10-06 DIAGNOSIS — N3000 Acute cystitis without hematuria: Secondary | ICD-10-CM | POA: Diagnosis not present

## 2022-10-06 DIAGNOSIS — I700 Atherosclerosis of aorta: Secondary | ICD-10-CM | POA: Diagnosis not present

## 2022-10-06 DIAGNOSIS — I5030 Unspecified diastolic (congestive) heart failure: Secondary | ICD-10-CM | POA: Diagnosis not present

## 2022-10-06 DIAGNOSIS — E669 Obesity, unspecified: Secondary | ICD-10-CM | POA: Diagnosis not present

## 2022-10-06 DIAGNOSIS — I878 Other specified disorders of veins: Secondary | ICD-10-CM | POA: Diagnosis not present

## 2022-10-06 DIAGNOSIS — E1159 Type 2 diabetes mellitus with other circulatory complications: Secondary | ICD-10-CM | POA: Diagnosis not present

## 2022-10-06 DIAGNOSIS — M5136 Other intervertebral disc degeneration, lumbar region: Secondary | ICD-10-CM | POA: Diagnosis not present

## 2022-10-06 DIAGNOSIS — I110 Hypertensive heart disease with heart failure: Secondary | ICD-10-CM | POA: Diagnosis not present

## 2022-10-06 DIAGNOSIS — R269 Unspecified abnormalities of gait and mobility: Secondary | ICD-10-CM | POA: Diagnosis not present

## 2022-10-06 DIAGNOSIS — R5381 Other malaise: Secondary | ICD-10-CM | POA: Diagnosis not present

## 2022-10-06 LAB — GLUCOSE, CAPILLARY: Glucose-Capillary: 229 mg/dL — ABNORMAL HIGH (ref 70–99)

## 2022-10-29 ENCOUNTER — Other Ambulatory Visit: Payer: Self-pay

## 2022-11-10 DIAGNOSIS — Z7984 Long term (current) use of oral hypoglycemic drugs: Secondary | ICD-10-CM | POA: Diagnosis not present

## 2022-11-10 DIAGNOSIS — R269 Unspecified abnormalities of gait and mobility: Secondary | ICD-10-CM | POA: Diagnosis not present

## 2022-11-10 DIAGNOSIS — I5032 Chronic diastolic (congestive) heart failure: Secondary | ICD-10-CM | POA: Diagnosis not present

## 2022-11-10 DIAGNOSIS — Z6834 Body mass index (BMI) 34.0-34.9, adult: Secondary | ICD-10-CM | POA: Diagnosis not present

## 2022-11-10 DIAGNOSIS — E669 Obesity, unspecified: Secondary | ICD-10-CM | POA: Diagnosis not present

## 2022-11-10 DIAGNOSIS — I152 Hypertension secondary to endocrine disorders: Secondary | ICD-10-CM | POA: Diagnosis not present

## 2022-11-10 DIAGNOSIS — E1159 Type 2 diabetes mellitus with other circulatory complications: Secondary | ICD-10-CM | POA: Diagnosis not present

## 2022-11-14 DIAGNOSIS — F079 Unspecified personality and behavioral disorder due to known physiological condition: Secondary | ICD-10-CM | POA: Diagnosis not present

## 2022-11-14 DIAGNOSIS — U071 COVID-19: Secondary | ICD-10-CM | POA: Diagnosis not present

## 2022-11-20 ENCOUNTER — Other Ambulatory Visit (HOSPITAL_COMMUNITY): Payer: Self-pay

## 2022-11-24 ENCOUNTER — Other Ambulatory Visit (HOSPITAL_COMMUNITY): Payer: Self-pay

## 2022-11-27 DIAGNOSIS — I5032 Chronic diastolic (congestive) heart failure: Secondary | ICD-10-CM | POA: Diagnosis not present

## 2022-11-27 DIAGNOSIS — E669 Obesity, unspecified: Secondary | ICD-10-CM | POA: Diagnosis not present

## 2022-11-27 DIAGNOSIS — R269 Unspecified abnormalities of gait and mobility: Secondary | ICD-10-CM | POA: Diagnosis not present

## 2022-11-27 DIAGNOSIS — R5381 Other malaise: Secondary | ICD-10-CM | POA: Diagnosis not present

## 2022-11-27 DIAGNOSIS — E1140 Type 2 diabetes mellitus with diabetic neuropathy, unspecified: Secondary | ICD-10-CM | POA: Diagnosis not present

## 2022-11-27 DIAGNOSIS — I878 Other specified disorders of veins: Secondary | ICD-10-CM | POA: Diagnosis not present

## 2022-11-27 DIAGNOSIS — I152 Hypertension secondary to endocrine disorders: Secondary | ICD-10-CM | POA: Diagnosis not present

## 2022-11-27 DIAGNOSIS — E1159 Type 2 diabetes mellitus with other circulatory complications: Secondary | ICD-10-CM | POA: Diagnosis not present

## 2022-11-27 DIAGNOSIS — Z6834 Body mass index (BMI) 34.0-34.9, adult: Secondary | ICD-10-CM | POA: Diagnosis not present

## 2022-11-27 DIAGNOSIS — Z7984 Long term (current) use of oral hypoglycemic drugs: Secondary | ICD-10-CM | POA: Diagnosis not present

## 2022-11-27 DIAGNOSIS — E1169 Type 2 diabetes mellitus with other specified complication: Secondary | ICD-10-CM | POA: Diagnosis not present

## 2022-12-25 DIAGNOSIS — E785 Hyperlipidemia, unspecified: Secondary | ICD-10-CM | POA: Diagnosis not present

## 2022-12-25 DIAGNOSIS — E119 Type 2 diabetes mellitus without complications: Secondary | ICD-10-CM | POA: Diagnosis not present

## 2022-12-25 DIAGNOSIS — I10 Essential (primary) hypertension: Secondary | ICD-10-CM | POA: Diagnosis not present

## 2022-12-25 DIAGNOSIS — D649 Anemia, unspecified: Secondary | ICD-10-CM | POA: Diagnosis not present

## 2023-01-05 DIAGNOSIS — E669 Obesity, unspecified: Secondary | ICD-10-CM | POA: Diagnosis not present

## 2023-01-05 DIAGNOSIS — R059 Cough, unspecified: Secondary | ICD-10-CM | POA: Diagnosis not present

## 2023-01-05 DIAGNOSIS — I110 Hypertensive heart disease with heart failure: Secondary | ICD-10-CM | POA: Diagnosis not present

## 2023-01-05 DIAGNOSIS — E1159 Type 2 diabetes mellitus with other circulatory complications: Secondary | ICD-10-CM | POA: Diagnosis not present

## 2023-01-05 DIAGNOSIS — Z7984 Long term (current) use of oral hypoglycemic drugs: Secondary | ICD-10-CM | POA: Diagnosis not present

## 2023-01-05 DIAGNOSIS — R269 Unspecified abnormalities of gait and mobility: Secondary | ICD-10-CM | POA: Diagnosis not present

## 2023-01-05 DIAGNOSIS — I5032 Chronic diastolic (congestive) heart failure: Secondary | ICD-10-CM | POA: Diagnosis not present

## 2023-01-05 DIAGNOSIS — R001 Bradycardia, unspecified: Secondary | ICD-10-CM | POA: Diagnosis not present

## 2023-01-14 DIAGNOSIS — F39 Unspecified mood [affective] disorder: Secondary | ICD-10-CM | POA: Diagnosis not present

## 2023-01-14 DIAGNOSIS — I110 Hypertensive heart disease with heart failure: Secondary | ICD-10-CM | POA: Diagnosis not present

## 2023-01-14 DIAGNOSIS — R269 Unspecified abnormalities of gait and mobility: Secondary | ICD-10-CM | POA: Diagnosis not present

## 2023-01-14 DIAGNOSIS — I509 Heart failure, unspecified: Secondary | ICD-10-CM | POA: Diagnosis not present

## 2023-02-11 DIAGNOSIS — Z7189 Other specified counseling: Secondary | ICD-10-CM | POA: Diagnosis not present

## 2023-02-25 DIAGNOSIS — R269 Unspecified abnormalities of gait and mobility: Secondary | ICD-10-CM | POA: Diagnosis not present

## 2023-02-25 DIAGNOSIS — R262 Difficulty in walking, not elsewhere classified: Secondary | ICD-10-CM | POA: Diagnosis not present

## 2023-02-25 DIAGNOSIS — E1169 Type 2 diabetes mellitus with other specified complication: Secondary | ICD-10-CM | POA: Diagnosis not present

## 2023-02-25 DIAGNOSIS — I5032 Chronic diastolic (congestive) heart failure: Secondary | ICD-10-CM | POA: Diagnosis not present

## 2023-02-25 DIAGNOSIS — J069 Acute upper respiratory infection, unspecified: Secondary | ICD-10-CM | POA: Diagnosis not present

## 2023-02-25 DIAGNOSIS — E1159 Type 2 diabetes mellitus with other circulatory complications: Secondary | ICD-10-CM | POA: Diagnosis not present

## 2023-02-25 DIAGNOSIS — I878 Other specified disorders of veins: Secondary | ICD-10-CM | POA: Diagnosis not present

## 2023-02-25 DIAGNOSIS — I152 Hypertension secondary to endocrine disorders: Secondary | ICD-10-CM | POA: Diagnosis not present

## 2023-02-25 DIAGNOSIS — Z6834 Body mass index (BMI) 34.0-34.9, adult: Secondary | ICD-10-CM | POA: Diagnosis not present

## 2023-02-25 DIAGNOSIS — E669 Obesity, unspecified: Secondary | ICD-10-CM | POA: Diagnosis not present

## 2023-02-25 DIAGNOSIS — Z7984 Long term (current) use of oral hypoglycemic drugs: Secondary | ICD-10-CM | POA: Diagnosis not present

## 2023-02-25 DIAGNOSIS — E1140 Type 2 diabetes mellitus with diabetic neuropathy, unspecified: Secondary | ICD-10-CM | POA: Diagnosis not present

## 2023-04-28 ENCOUNTER — Other Ambulatory Visit (HOSPITAL_COMMUNITY): Payer: Self-pay

## 2023-05-05 ENCOUNTER — Other Ambulatory Visit: Payer: Self-pay

## 2024-01-14 ENCOUNTER — Encounter (HOSPITAL_BASED_OUTPATIENT_CLINIC_OR_DEPARTMENT_OTHER): Attending: Internal Medicine | Admitting: Internal Medicine

## 2024-01-14 DIAGNOSIS — I87303 Chronic venous hypertension (idiopathic) without complications of bilateral lower extremity: Secondary | ICD-10-CM | POA: Insufficient documentation

## 2024-03-16 ENCOUNTER — Other Ambulatory Visit (HOSPITAL_COMMUNITY): Payer: Self-pay

## 2024-04-15 ENCOUNTER — Other Ambulatory Visit (HOSPITAL_COMMUNITY): Payer: Self-pay
